# Patient Record
Sex: Male | Born: 1955 | Race: White | Hispanic: No | Smoking: Former smoker
Health system: Southern US, Community
[De-identification: ages and names within clinical notes are randomized; demographics above are authoritative.]

## PROBLEM LIST (undated history)

## (undated) DIAGNOSIS — L0390 Cellulitis, unspecified: Secondary | ICD-10-CM

## (undated) DIAGNOSIS — J439 Emphysema, unspecified: Secondary | ICD-10-CM

## (undated) DIAGNOSIS — F32A Depression, unspecified: Secondary | ICD-10-CM

## (undated) DIAGNOSIS — I10 Essential (primary) hypertension: Secondary | ICD-10-CM

## (undated) DIAGNOSIS — T8859XA Other complications of anesthesia, initial encounter: Secondary | ICD-10-CM

## (undated) DIAGNOSIS — E291 Testicular hypofunction: Secondary | ICD-10-CM

## (undated) DIAGNOSIS — F329 Major depressive disorder, single episode, unspecified: Secondary | ICD-10-CM

## (undated) DIAGNOSIS — N529 Male erectile dysfunction, unspecified: Secondary | ICD-10-CM

## (undated) DIAGNOSIS — A449 Bartonellosis, unspecified: Secondary | ICD-10-CM

## (undated) DIAGNOSIS — A770 Spotted fever due to Rickettsia rickettsii: Secondary | ICD-10-CM

## (undated) DIAGNOSIS — F0781 Postconcussional syndrome: Secondary | ICD-10-CM

## (undated) DIAGNOSIS — Z9861 Coronary angioplasty status: Principal | ICD-10-CM

## (undated) DIAGNOSIS — I2510 Atherosclerotic heart disease of native coronary artery without angina pectoris: Principal | ICD-10-CM

## (undated) DIAGNOSIS — J189 Pneumonia, unspecified organism: Secondary | ICD-10-CM

## (undated) DIAGNOSIS — I6501 Occlusion and stenosis of right vertebral artery: Secondary | ICD-10-CM

## (undated) DIAGNOSIS — G319 Degenerative disease of nervous system, unspecified: Secondary | ICD-10-CM

## (undated) DIAGNOSIS — I7300 Raynaud's syndrome without gangrene: Secondary | ICD-10-CM

## (undated) DIAGNOSIS — T4145XA Adverse effect of unspecified anesthetic, initial encounter: Secondary | ICD-10-CM

## (undated) DIAGNOSIS — F419 Anxiety disorder, unspecified: Secondary | ICD-10-CM

## (undated) DIAGNOSIS — E785 Hyperlipidemia, unspecified: Secondary | ICD-10-CM

## (undated) DIAGNOSIS — G2581 Restless legs syndrome: Secondary | ICD-10-CM

## (undated) DIAGNOSIS — G0490 Encephalitis and encephalomyelitis, unspecified: Secondary | ICD-10-CM

## (undated) DIAGNOSIS — S069XAA Unspecified intracranial injury with loss of consciousness status unknown, initial encounter: Secondary | ICD-10-CM

## (undated) DIAGNOSIS — S069X9A Unspecified intracranial injury with loss of consciousness of unspecified duration, initial encounter: Secondary | ICD-10-CM

## (undated) DIAGNOSIS — F988 Other specified behavioral and emotional disorders with onset usually occurring in childhood and adolescence: Secondary | ICD-10-CM

## (undated) HISTORY — DX: Raynaud's syndrome without gangrene: I73.00

## (undated) HISTORY — DX: Bartonellosis, unspecified: A44.9

## (undated) HISTORY — DX: Emphysema, unspecified: J43.9

## (undated) HISTORY — DX: Major depressive disorder, single episode, unspecified: F32.9

## (undated) HISTORY — DX: Occlusion and stenosis of right vertebral artery: I65.01

## (undated) HISTORY — DX: Other specified behavioral and emotional disorders with onset usually occurring in childhood and adolescence: F98.8

## (undated) HISTORY — DX: Restless legs syndrome: G25.81

## (undated) HISTORY — DX: Anxiety disorder, unspecified: F41.9

## (undated) HISTORY — DX: Atherosclerotic heart disease of native coronary artery without angina pectoris: I25.10

## (undated) HISTORY — DX: Male erectile dysfunction, unspecified: N52.9

## (undated) HISTORY — DX: Coronary angioplasty status: Z98.61

## (undated) HISTORY — DX: Postconcussional syndrome: F07.81

## (undated) HISTORY — DX: Degenerative disease of nervous system, unspecified: G31.9

## (undated) HISTORY — DX: Pneumonia, unspecified organism: J18.9

## (undated) HISTORY — DX: Depression, unspecified: F32.A

## (undated) HISTORY — DX: Testicular hypofunction: E29.1

## (undated) HISTORY — DX: Unspecified intracranial injury with loss of consciousness status unknown, initial encounter: S06.9XAA

## (undated) HISTORY — DX: Essential (primary) hypertension: I10

## (undated) HISTORY — DX: Encephalitis and encephalomyelitis, unspecified: G04.90

## (undated) HISTORY — DX: Unspecified intracranial injury with loss of consciousness of unspecified duration, initial encounter: S06.9X9A

## (undated) HISTORY — DX: Cellulitis, unspecified: L03.90

## (undated) HISTORY — DX: Spotted fever due to Rickettsia rickettsii: A77.0

## (undated) HISTORY — DX: Hyperlipidemia, unspecified: E78.5

## (undated) NOTE — *Deleted (*Deleted)
PATIENT: Phillip [name] DOB: [birth year]  REASON FOR VISIT: follow up HISTORY FROM: patient  HISTORY OF PRESENT ILLNESS: Today 03/28/20:  09/26/19: Mr. Walkowiak is a 69 year old male with a history of insomnia and ADHD.  He returns today for follow-up.  He has reduced Ativan to 0.5 mg at bedtime.  Reports that this continues to work fairly well for him.  He would like to further reduce his dose.  He continues on Adderall 60 mg daily.  Reports that he also switched his diet to a plant-based diet.  He returns today for an evaluation.  HISTORY 03/29/19:  Mr. Haslam is a 38 year old male with a history of insomnia and ADHD.  He returns today for follow-up.  He continues on Adderall 60 mg daily.  He reports that this continues to work well for him.  He has continue trying to wean off of Ativan.  He is now taking 1 1/2 mg alternating with 1 milligram every other night.  He states that this is working well for him.  He would like to continue to decrease his dose.  When he had his sleep study in 2014 he did have  periodic limb movement disorder.  He was tried on Requip but reports that it caused confusion.  Since then he has not been on any other medication.  He returns today for an evaluation.  REVIEW OF SYSTEMS: Out of a complete 14 system review of symptoms, the patient complains only of the following symptoms, and all other reviewed systems are negative.  See HPI  ALLERGIES: Allergies  Allergen Reactions  . Penicillins Hives    Did it involve swelling of the face/tongue/throat, SOB, or low BP? Unknown Did it involve sudden or severe rash/hives, skin peeling, or any reaction on the inside of your mouth or nose? Yes Did you need to seek medical attention at a hospital or doctor's office? Unknown When did it last happen?Occurred at age 55 or 8 If all above answers are "NO", may proceed with cephalosporin use.   . Statins Other (See Comments)    confusion  . Methylprednisolone Other (See  Comments)    Unsure of exact reaction type  . Amphetamines Other (See Comments)    Intolerant to specific formulations: [employer] amphetamine TEVA dextroamp amphetamine  . Lorazepam Other (See Comments)    Mylan lorazepam; others ok  . Propofol Other (See Comments)    Cognitive delay and emotional    HOME MEDICATIONS: Outpatient Medications Prior to Visit  Medication Sig Dispense Refill  . amLODipine (NORVASC) 5 MG tablet TAKE 1 TABLET BY MOUTH EVERY DAY 90 tablet 3  . amphetamine-dextroamphetamine (ADDERALL) 30 MG tablet Take 1-2 tablets by mouth daily. 60 tablet 0  . Ascorbic Acid (VITAMIN C PO) Take 1 tablet by mouth 2 (two) times daily.    . B Complex-C (B-COMPLEX WITH VITAMIN C) tablet Take 1 tablet by mouth daily.    . Cholecalciferol 125 MCG (5000 UT) TABS     . clopidogrel (PLAVIX) 75 MG tablet TAKE 1 TABLET BY MOUTH EVERY DAY WITH BREAKFAST 90 tablet 2  . Coenzyme Q10 (COQ10) 100 MG CAPS Take 100 mg by mouth 2 (two) times daily.    . Evolocumab (REPATHA SURECLICK) 140 MG/ML SOAJ Inject 140 mg into the skin every 14 (fourteen) days. 2 pen 11  . Menaquinone-7 (VITAMIN K2) 100 MCG CAPS     . Misc Natural Products (PROSTATE SUPPORT PO) Take 1 capsule by mouth every evening. Prostate Plus    .  nitroGLYCERIN (NITROSTAT) 0.4 MG SL tablet PLACE 1 TABLET UNDER TONGUE EVERY 5 MINS AS NEEDED FOR CHEST PAIN 25 tablet 6  . Omega-3 Fatty Acids (SUPER OMEGA 3 EPA/DHA PO) Take 1 capsule by mouth daily.    . Probiotic Product ([employer] PROBIOTIC) CAPS     . sertraline (ZOLOFT) 25 MG tablet Take 2 tablets (50 mg total) by mouth daily. 180 tablet 3  . testosterone cypionate (DEPOTESTOSTERONE CYPIONATE) 200 MG/ML injection Inject 0.3 ML once a week 10 mL 2  . Zinc Acetate (GALZIN) 25 MG CAPS Take by mouth.    . zolpidem (AMBIEN) 10 MG tablet Take 1 tablet (10 mg total) by mouth at bedtime as needed for sleep. 30 tablet 0   No facility-administered medications prior to visit.    PAST MEDICAL  HISTORY: Past Medical History:  Diagnosis Date  . ADD (attention deficit disorder)    on adderrall managed by neurology  . Anxiety    on ativan managed by Neurology  . 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 CAD S/P percutaneous coronary angioplasty 06/2017   pLAD1 55% (not significant), p-mLAD2 80% & mLAD 70% (tandem lesions - DES PCI . Synergy DES 2.75 x 38 -- 3.0 mm).  mCx ~60% - med Rx, mRCA 30% & 40%.  ER 55-60%.  . Cellulitis   . Chronic traumatic encephalopathy    right frontal-temproal lobe  . Complication of anesthesia    " BRAIN FOG "  . Coronary artery calcification seen on CAT scan 05/12/2018   Triple- by CT - lung ca screen 04/2018  Cardio 06/2018: Coronary calcium score results shows aortic atherosclerosis (with mild dilation) as well as left main and three-vessel coronary artery calcification. The calcium score is 1168 which is very high risk amount of calcium.  Based on these findings, I would recommend proceeding with a coronary CT angiogram (a more detailed study with Intravenous X  . Depression   . Erectile dysfunction 11/2016   follows with urology; sildenafil prescribed  . Meningoencephalitis 1994   [facility]  . Pneumonia   . Primary hypogonadism in male   . Raynaud's disease 51/70/0174   As complication of RMSF  . Restless legs 09/13/2015  . [place] spotted fever 2017  . TBI (traumatic brain injury) ([place]) 1997, 2009   "concussions"    PAST SURGICAL HISTORY: Past Surgical History:  Procedure Laterality Date  . CORONARY STENT INTERVENTION N/A 06/30/2018   Procedure: CORONARY STENT INTERVENTION;  Surgeon: [name], MD;  Location: [facility];;  p-mLAD2 80% & mLAD 70% (tandem lesions) - DES PCI Synergy DES 2.75 x 38 (3.0 mm).  . CT CTA CORONARY W/CA SCORE W/CM &/OR WO/CM  06/2018   Cardiac cath score read 1318.  Moderate coronary disease sent for FFR: mRCA 0.84  (non-significant), mLAD 0.64 (signifiant), pLOM(Cx) - 0,72 (significant - but distal lesion)  . LEFT HEART CATH AND CORONARY ANGIOGRAPHY N/A 06/30/2018   Procedure: LEFT HEART CATH AND CORONARY ANGIOGRAPHY;  Surgeon: [name], MD;  Location: [facility];;  pLAD1 55% (not significant), p-mLAD2 80% & mLAD 70% (tandem lesions -> DES PCI). mCx ~60% (med Rx), mRCA 30% & 40%.  ER 55-60%.   . TEE WITHOUT CARDIOVERSION N/A 09/06/2015   Procedure: TRANSESOPHAGEAL ECHOCARDIOGRAM (TEE);  Surgeon: Jerline Pain, MD;  R/O endocarditis;  . TONSILLECTOMY  1965    FAMILY HISTORY: Family  History  Problem Relation Age of Onset  . Hyperlipidemia Father   . Heart disease Maternal Grandfather 75  . Cancer Paternal Grandfather   . Mental illness Mother   . Mental illness Brother     SOCIAL HISTORY: Social History   Socioeconomic History  . Marital status: Divorced    Spouse name: [name]  . Number of children: 2  . Years of education: 58  . Highest education level: Not on file  Occupational History  . Occupation: Retired  Tobacco Use  . Smoking status: Former Smoker    Packs/day: 1.50    Years: 25.00    Pack years: 37.50    Types: Cigarettes    Quit date: 11/22/2014    Years since quitting: 5.3  . Smokeless tobacco: Never Used  . Tobacco comment: Encouraged to remain smoke free  Vaping Use  . Vaping Use: Never used  Substance and Sexual Activity  . Alcohol use: No    Alcohol/week: 0.0 standard drinks  . Drug use: No  . Sexual activity: Yes    Partners: Female  Other Topics Concern  . Not on file  Social History Narrative   Divorced. B.A. degree. Retired.   Drinks caffeine, uses herbal remedies, takes a daily vitamin.   Wears his seatbelt.  Smoke detector in the home.   Firearms locked in the home.   Feels safe in relationships.       He now has switched to a vegan diet.  Notably increase exercise level.   Social Determinants of Health   Financial Resource Strain:   .  Difficulty of Paying Living Expenses: Not on file  Food Insecurity:   . Worried About Charity fundraiser in the Last Year: Not on file  . Ran Out of Food in the Last Year: Not on file  Transportation Needs:   . Lack of Transportation (Medical): Not on file  . Lack of Transportation (Non-Medical): Not on file  Physical Activity:   . Days of Exercise per Week: Not on file  . Minutes of Exercise per Session: Not on file  Stress:   . Feeling of Stress : Not on file  Social Connections:   . Frequency of Communication with Friends and Family: Not on file  . Frequency of Social Gatherings with Friends and Family: Not on file  . Attends Religious Services: Not on file  . Active Member of Clubs or Organizations: Not on file  . Attends Archivist Meetings: Not on file  . Marital Status: Not on file  Intimate Partner Violence:   . Fear of Current or Ex-Partner: Not on file  . Emotionally Abused: Not on file  . Physically Abused: Not on file  . Sexually Abused: Not on file      PHYSICAL EXAM  There were no vitals filed for this visit. There is no height or weight on file to calculate BMI.  Generalized: Well developed, in no acute distress   Neurological examination  Mentation: Alert oriented to time, place, history taking. Follows all commands speech and language fluent Cranial nerve II-XII: Pupils were equal round reactive to light. Extraocular movements were full, visual field were full on confrontational test.  Head turning and shoulder shrug  were normal and symmetric. Motor: The motor testing reveals 5 over 5 strength of all 4 extremities. Good symmetric motor tone is noted throughout.  Sensory: Sensory testing is intact to soft touch on all 4 extremities. No evidence of extinction is noted.  Coordination:  Cerebellar testing reveals good finger-nose-finger and heel-to-shin bilaterally.  Gait and station: Gait is normal Reflexes: Deep tendon reflexes are symmetric and  normal bilaterally.   DIAGNOSTIC DATA (LABS, IMAGING, TESTING) - I reviewed patient records, labs, notes, testing and imaging myself where available.  Lab Results  Component Value Date   WBC 3.9 07/08/2019   HGB 14.3 07/08/2019   HCT 43.5 07/08/2019   MCV 96 07/08/2019   PLT 269 07/08/2019      Component Value Date/Time   NA 140 03/22/2020 0915   K 5.6 (H) 03/22/2020 0915   CL 103 03/22/2020 0915   CO2 27 03/22/2020 0915   GLUCOSE 98 03/22/2020 0915   GLUCOSE 108 (H) 06/08/2019 1819   BUN 16 03/22/2020 0915   CREATININE 0.93 03/22/2020 0915   CREATININE 0.71 01/03/2015 1339   CALCIUM 9.4 03/22/2020 0915   PROT 6.9 03/22/2020 0915   ALBUMIN 4.6 03/22/2020 0915   AST 27 03/22/2020 0915   ALT 16 03/22/2020 0915   ALKPHOS 86 03/22/2020 0915   BILITOT 0.5 03/22/2020 0915   GFRNONAA 88 03/22/2020 0915   GFRNONAA >89 04/12/2014 1722   GFRAA 101 03/22/2020 0915   GFRAA >89 04/12/2014 1722   Lab Results  Component Value Date   CHOL 134 03/22/2020   HDL 72 03/22/2020   LDLCALC 52 03/22/2020   TRIG 42 03/22/2020   CHOLHDL 1.9 03/22/2020   Lab Results  Component Value Date   HGBA1C 5.0 07/08/2019    Lab Results  Component Value Date   TSH 2.67 12/15/2017      ASSESSMENT AND PLAN 54 y.o. year old male  has a past medical history of ADD (attention deficit disorder), Anxiety, Atrophy, cortical (2017), Bartonella infection (2017), CAD S/P percutaneous coronary angioplasty (06/2017), Cellulitis, Chronic traumatic encephalopathy, Complication of anesthesia, Coronary artery calcification seen on CAT scan (05/12/2018), Depression, Erectile dysfunction (11/2016), Meningoencephalitis (1994), Pneumonia, Primary hypogonadism in male, Raynaud's disease (09/13/2015), Restless legs (09/13/2015), [facility] spotted fever (2017), and TBI (traumatic brain injury) ([facility]) (1997, 2009). here with:  ADHD   Continue Adderall 60 mg daily  Insomnia   Continue Ativan to 0.25 mg at  bedtime  Advised if symptoms worsen or he develops new symptoms he should let us know.  Follow-up in 6 months or sooner if needed   I spent 20 minutes of face-to-face and non-face-to-face time with patient.  This included previsit chart review, lab review, study review, order entry, electronic health record documentation, patient education.  Butch Penny, MSN, NP-C 03/28/2020, 7:58 AM [facility] [address], [address] [place], [place] [address] [phone]

---
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
## 1992-05-05 DIAGNOSIS — G0490 Encephalitis and encephalomyelitis, unspecified: Secondary | ICD-10-CM

## 1992-05-05 HISTORY — DX: Encephalitis and encephalomyelitis, unspecified: G04.90

## 2002-07-03 ENCOUNTER — Encounter: Payer: Self-pay | Admitting: Emergency Medicine

## 2002-07-03 ENCOUNTER — Emergency Department (HOSPITAL_COMMUNITY): Admission: EM | Admit: 2002-07-03 | Discharge: 2002-07-03 | Payer: Self-pay | Admitting: Emergency Medicine

## 2011-04-09 ENCOUNTER — Encounter: Payer: Self-pay | Admitting: Internal Medicine

## 2011-06-18 ENCOUNTER — Other Ambulatory Visit: Payer: Self-pay | Admitting: Family Medicine

## 2011-06-18 ENCOUNTER — Encounter: Payer: Self-pay | Admitting: Internal Medicine

## 2011-06-30 ENCOUNTER — Telehealth: Payer: Self-pay

## 2011-06-30 NOTE — Telephone Encounter (Signed)
CHART AT N[place] . [place] [id number]

## 2011-06-30 NOTE — Telephone Encounter (Signed)
Patient calling to get a refill on his adderall , he states he would like Dr. Merla Riches to know he has made ant appt to see him Wednesday,,

## 2011-07-01 NOTE — Telephone Encounter (Signed)
Can refill on appt Wednesday, as it is now tues night.  Phillip [name]

## 2011-07-05 ENCOUNTER — Telehealth: Payer: Self-pay

## 2011-07-05 NOTE — Telephone Encounter (Signed)
LMOM TO CB.  APPT WAS SUPPOSE TO BE LAST WED.  DO NOT SEE APPT IN FILE.  NEEDS TO BE SEEN.  SEE PREVIOUS MESSAGES

## 2011-07-05 NOTE — Telephone Encounter (Signed)
PT CALLED AND SAID REQUESTED ADDERALL TO BE REFILLED 3 DAYS AGO.  PT SAYS HE TAKES 30 MG 3 X DAILY

## 2011-07-06 NOTE — Telephone Encounter (Signed)
LMOM TO CB 

## 2011-07-06 NOTE — Telephone Encounter (Signed)
PT WILL RTC.  PT MISSED APPT ON 06/18/11.  ADVISED PT HE MUST BE SEEN

## 2011-07-09 ENCOUNTER — Ambulatory Visit (INDEPENDENT_AMBULATORY_CARE_PROVIDER_SITE_OTHER): Payer: BC Managed Care – PPO | Admitting: Internal Medicine

## 2011-07-09 VITALS — BP 127/80 | HR 71 | Temp 97.7°F | Resp 16 | Ht 72.5 in | Wt 178.0 lb

## 2011-07-09 DIAGNOSIS — Z1339 Encounter for screening examination for other mental health and behavioral disorders: Secondary | ICD-10-CM

## 2011-07-09 DIAGNOSIS — F09 Unspecified mental disorder due to known physiological condition: Secondary | ICD-10-CM

## 2011-07-09 DIAGNOSIS — F988 Other specified behavioral and emotional disorders with onset usually occurring in childhood and adolescence: Secondary | ICD-10-CM

## 2011-07-09 MED ORDER — AMPHETAMINE-DEXTROAMPHETAMINE 30 MG PO TABS: 30.0000 mg | ORAL_TABLET | Freq: Two times a day (BID) | ORAL | Status: DC

## 2011-07-09 NOTE — Patient Instructions (Signed)
Take meds as directed

## 2011-07-09 NOTE — Progress Notes (Signed)
  Subjective:    Patient ID: Phillip [name], male    DOB: [birth year], 56 y.o.   MRN: [id number]  HPI weakness fatigue dificulty concentrationg. Ran out of adderall 1 week ago. Decreased sthse dose himself to bid. Had been on meds for 20 years since encephalitis.   Review of Systems  Constitutional: Positive for activity change and fatigue.  HENT: Negative.   Eyes: Negative.   Respiratory: Negative.   Cardiovascular: Negative.   Gastrointestinal: Negative.   Genitourinary: Negative.   Musculoskeletal: Negative.   Neurological: Negative.   Hematological: Negative.   Psychiatric/Behavioral: Negative.   All other systems reviewed and are negative.       Objective:   Physical Exam  Nursing note and vitals reviewed. Constitutional: He is oriented to person, place, and time. He appears well-developed and well-nourished.  HENT:  Head: Normocephalic and atraumatic.  Eyes: Conjunctivae and EOM are normal. Pupils are equal, round, and reactive to light.  Neck: Normal range of motion.  Cardiovascular: Normal rate, regular rhythm and normal heart sounds.   Pulmonary/Chest: Effort normal and breath sounds normal.  Abdominal: Soft. Bowel sounds are normal.  Musculoskeletal: Normal range of motion.  Neurological: He is alert and oriented to person, place, and time.  Skin: Skin is warm and dry.  Psychiatric: He has a normal mood and affect. His behavior is normal. Judgment and thought content normal.          Assessment & Plan:  Adhd, previous encephalomeningitis meds to be renewed.

## 2011-08-09 ENCOUNTER — Telehealth: Payer: Self-pay

## 2011-08-09 ENCOUNTER — Other Ambulatory Visit: Payer: Self-pay | Admitting: Internal Medicine

## 2011-08-09 DIAGNOSIS — F09 Unspecified mental disorder due to known physiological condition: Secondary | ICD-10-CM

## 2011-08-09 MED ORDER — AMPHETAMINE-DEXTROAMPHETAMINE 30 MG PO TABS: 30.0000 mg | ORAL_TABLET | Freq: Two times a day (BID) | ORAL | Status: DC

## 2011-08-09 NOTE — Telephone Encounter (Signed)
Adderall RF, please call pt to pick-up

## 2011-08-09 NOTE — Telephone Encounter (Signed)
Pt requests adderall refill  Best: [phone]

## 2011-08-09 NOTE — Telephone Encounter (Signed)
LMOM notifying patient rx ready to pick up. 

## 2011-08-17 ENCOUNTER — Other Ambulatory Visit: Payer: Self-pay | Admitting: Physician Assistant

## 2011-09-12 ENCOUNTER — Telehealth: Payer: Self-pay

## 2011-09-12 DIAGNOSIS — F09 Unspecified mental disorder due to known physiological condition: Secondary | ICD-10-CM

## 2011-09-12 NOTE — Telephone Encounter (Signed)
.  umfc The patient called to request refill of Adderall 30 mg twice daily tablets.  The patient may be reached at [phone] when ready for pick up.

## 2011-09-13 MED ORDER — AMPHETAMINE-DEXTROAMPHETAMINE 30 MG PO TABS: 30.0000 mg | ORAL_TABLET | Freq: Two times a day (BID) | ORAL | Status: DC

## 2011-09-13 NOTE — Telephone Encounter (Signed)
Spoke with pt advised RX ready to pick up 

## 2011-09-13 NOTE — Telephone Encounter (Signed)
Refill complete and at TL desk

## 2011-10-14 ENCOUNTER — Telehealth: Payer: Self-pay

## 2011-10-14 DIAGNOSIS — F09 Unspecified mental disorder due to known physiological condition: Secondary | ICD-10-CM

## 2011-10-14 MED ORDER — AMPHETAMINE-DEXTROAMPHETAMINE 30 MG PO TABS: 30.0000 mg | ORAL_TABLET | Freq: Two times a day (BID) | ORAL | Status: DC

## 2011-10-14 NOTE — Telephone Encounter (Signed)
LMOM THAT RX IS READY FOR PICKUP 

## 2011-10-14 NOTE — Telephone Encounter (Signed)
Rx done and ready for pickup 

## 2011-10-14 NOTE — Telephone Encounter (Signed)
PT IN NEED OF HIS ADDERALL 30MG S. PLEASE CALL [phone] WHEN READY FOR P/U

## 2011-11-13 ENCOUNTER — Other Ambulatory Visit: Payer: Self-pay | Admitting: Physician Assistant

## 2011-11-13 NOTE — Telephone Encounter (Signed)
PT REQUESTING ADDERALL REFILL   BEST PHONE  [phone]

## 2011-11-14 ENCOUNTER — Other Ambulatory Visit: Payer: Self-pay | Admitting: *Deleted

## 2011-11-16 ENCOUNTER — Telehealth: Payer: Self-pay

## 2011-11-16 DIAGNOSIS — F09 Unspecified mental disorder due to known physiological condition: Secondary | ICD-10-CM

## 2011-11-16 MED ORDER — AMPHETAMINE-DEXTROAMPHETAMINE 30 MG PO TABS: 30.0000 mg | ORAL_TABLET | Freq: Two times a day (BID) | ORAL | Status: DC

## 2011-11-16 NOTE — Telephone Encounter (Signed)
Called pt advised to pick up RX

## 2011-11-16 NOTE — Telephone Encounter (Signed)
Rx printed

## 2011-11-16 NOTE — Telephone Encounter (Signed)
PATIENT SAID HE CALLED TWO-THREE DAYS AGO ABOUT A REFILL ON HIS ADDERALL.  HE RAN OUT ON THE 12TH.  HE HAS NOT HEARD FROM [place] AND I DO NOT SEE A MESSAGE IN REGARDS TO THIS.  PLEASE CALL ASAP AT [phone]

## 2011-12-15 ENCOUNTER — Other Ambulatory Visit: Payer: Self-pay | Admitting: Physician Assistant

## 2011-12-15 ENCOUNTER — Other Ambulatory Visit: Payer: Self-pay | Admitting: Family Medicine

## 2011-12-15 MED ORDER — LORAZEPAM 2 MG PO TABS: 2.0000 mg | ORAL_TABLET | Freq: Every day | ORAL | Status: DC

## 2011-12-17 ENCOUNTER — Telehealth: Payer: Self-pay

## 2011-12-17 DIAGNOSIS — F09 Unspecified mental disorder due to known physiological condition: Secondary | ICD-10-CM

## 2011-12-17 MED ORDER — AMPHETAMINE-DEXTROAMPHETAMINE 30 MG PO TABS: 30.0000 mg | ORAL_TABLET | Freq: Two times a day (BID) | ORAL | Status: DC

## 2011-12-17 NOTE — Telephone Encounter (Signed)
Notified pt Rx ready for p/up 

## 2011-12-17 NOTE — Telephone Encounter (Signed)
Done and printed

## 2011-12-17 NOTE — Telephone Encounter (Signed)
Pt would like a refill on adderall 30mg . Best# [phone]

## 2012-01-06 ENCOUNTER — Emergency Department (HOSPITAL_BASED_OUTPATIENT_CLINIC_OR_DEPARTMENT_OTHER)
Admission: EM | Admit: 2012-01-06 | Discharge: 2012-01-06 | Disposition: A | Discharge status: Home / Self Care | Payer: BC Managed Care – PPO | Attending: Emergency Medicine | Admitting: Emergency Medicine

## 2012-01-06 ENCOUNTER — Encounter (HOSPITAL_BASED_OUTPATIENT_CLINIC_OR_DEPARTMENT_OTHER): Payer: Self-pay | Admitting: *Deleted

## 2012-01-06 DIAGNOSIS — Z87891 Personal history of nicotine dependence: Secondary | ICD-10-CM | POA: Insufficient documentation

## 2012-01-06 DIAGNOSIS — L259 Unspecified contact dermatitis, unspecified cause: Secondary | ICD-10-CM

## 2012-01-06 MED ORDER — DOXYCYCLINE HYCLATE 100 MG PO CAPS: 100.0000 mg | ORAL_CAPSULE | Freq: Two times a day (BID) | ORAL | Status: AC

## 2012-01-06 MED ORDER — LIDOCAINE-PRILOCAINE 2.5-2.5 % EX CREA: TOPICAL_CREAM | CUTANEOUS | Status: AC | PRN

## 2012-01-06 MED ORDER — HYDROCORTISONE 2.5 % EX CREA: TOPICAL_CREAM | Freq: Two times a day (BID) | CUTANEOUS | Status: AC

## 2012-01-06 NOTE — ED Notes (Signed)
MD at bedside. 

## 2012-01-06 NOTE — ED Notes (Signed)
Redness, swelling, pain to his right ankle.

## 2012-01-07 NOTE — ED Provider Notes (Signed)
History     CSN: [id number]  Arrival date & time 01/06/12  2045   First MD Initiated Contact with Patient 01/06/12 2204      Chief Complaint  Patient presents with  . Abscess    (Consider location/radiation/quality/duration/timing/severity/associated sxs/prior treatment) HPI Phillip [name] is a 56 y.o. male presents for a rash on his medial malleolus of the right foot. Patient says he is active this weekend swimming but did not get into any poison ivy that he knows of. Was wearing a boot yesterday and noticed some irritation on the medial aspect of his foot yesterday. His pain irritation is currently a 1/2, mildly itchy, has a mild pain, does not radiate, no other alleviating or exacerbating factors and no other associated symptoms. Patient denies any fevers, chills, calf pain  History reviewed. No pertinent past medical history.  History reviewed. No pertinent past surgical history.  No family history on file.  History  Substance Use Topics  . Smoking status: Former Games developer  . Smokeless tobacco: Not on file  . Alcohol Use: Yes      Review of Systems positive for rash; Patient denies any fevers or chills, changes in vision, earache, sore throat, neck pain or stiffness, chest pain or pressure, palpitations, syncope, dyspnea, cough, wheezing,  abdominal pain, nausea, vomiting, diarrhea, melena, red bloody stools, frequency, dysuria, myalgias, arthralgias, back pain, recent trauma, easy bruising or bleeding, headache, seizures, numbness, tingling or weakness.   Allergies  Penicillins  Home Medications   Current Outpatient Rx  Name Route Sig Dispense Refill  . DONEPEZIL HCL 10 MG PO TABS Oral Take 10 mg by mouth at bedtime.    . ADULT MULTIVITAMIN W/MINERALS CH Oral Take 1 tablet by mouth daily.    Marland Kitchen FISH OIL PO Oral Take 1 capsule by mouth daily.    Marland Kitchen DOXYCYCLINE HYCLATE 100 MG PO CAPS Oral Take 1 capsule (100 mg total) by mouth 2 (two) times daily. 20 capsule 0  .  HYDROCORTISONE 2.5 % EX CREA Topical Apply topically 2 (two) times daily. 30 g 0  . LIDOCAINE-PRILOCAINE 2.5-2.5 % EX CREA Topical Apply topically as needed. 30 g 0    BP 131/64  Pulse 75  Temp 97.8 F (36.6 C) (Oral)  Resp 20  SpO2 99%  Physical Exam  Skin:      VITAL SIGNS:   Filed Vitals:   01/06/12 2109  BP: 131/64  Pulse: 75  Temp: 97.8 F (36.6 C)  Resp: 20   CONSTITUTIONAL: Awake, oriented, appears non-toxic HENT: Atraumatic, normocephalic, oral mucosa pink and moist, airway patent. Nares patent without drainage. External ears normal. EYES: Conjunctiva clear, EOMI, PERRLA NECK: Trachea midline, non-tender, supple CARDIOVASCULAR: Normal heart rate, Normal rhythm, No murmurs, rubs, gallops PULMONARY/CHEST: Clear to auscultation, no rhonchi, wheezes, or rales. Symmetrical breath sounds. Non-tender. ABDOMINAL: Non-distended, soft, non-tender - no rebound or guarding.  BS normal. NEUROLOGIC: Non-focal, moving all four extremities, no gross sensory or motor deficits. EXTREMITIES: No clubbing, cyanosis. Patient has a well demarcated area of erythema on his left medial malleolus. There appear to be some vesicles on the anterior aspect of the lesion and there is one blister over the center of the malleolus. It is not draining, is not bleeding, it is mildly pruritic. SKIN: Warm, Dry, No erythema, No rash  ED Course  Procedures (including critical care time)  Labs Reviewed - No data to display No results found.   1. Contact dermatitis       MDM  Phillip [name] is a 56 y.o. male presenting with a likely contact dermatitis on the medial malleolus. approximately 2 meters by 5 cm, well-demarcated borders is very suggestive of a poison ivy/Toxicodendron dermatitis. Patient says he has had contact with brown recluse spiders in the past, however his injuries and consistent with a spider bite, is possible that it is a bite and in that case we will give the patient some  antibiotics in case the wound worsens. I have drawn a line around the lesion with a skin marking pen.  Patient will use hydrocortisone cream and some EMLA cream for comfort.  He understands to return to emergency department or seek care from his primary care provider in case the wound worsens or there are any signs of infections.  If patient has a delay in seeking care he may fill the prescription for doxycycline and begin taking antibiotics.        Jones Skene, MD 01/07/12 1610

## 2012-01-14 ENCOUNTER — Encounter: Payer: BC Managed Care – PPO | Admitting: Internal Medicine

## 2012-01-17 ENCOUNTER — Other Ambulatory Visit: Payer: Self-pay

## 2012-01-17 MED ORDER — LORAZEPAM 2 MG PO TABS: 2.0000 mg | ORAL_TABLET | Freq: Four times a day (QID) | ORAL | Status: AC | PRN

## 2012-01-19 ENCOUNTER — Telehealth: Payer: Self-pay

## 2012-01-19 MED ORDER — AMPHETAMINE-DEXTROAMPHETAMINE 30 MG PO TABS: 30.0000 mg | ORAL_TABLET | Freq: Two times a day (BID) | ORAL | Status: DC

## 2012-01-19 NOTE — Telephone Encounter (Signed)
Patient notified and rx in pickup drawer. 

## 2012-01-19 NOTE — Telephone Encounter (Signed)
Rx done and ready for pickup 

## 2012-01-19 NOTE — Telephone Encounter (Signed)
Pt requesting adderall refill 30 mg one tablet two times day    CBN: [phone]

## 2012-01-19 NOTE — Telephone Encounter (Signed)
Patient due for follow up. His appt was RS from 01/14/12 to October by our office, he does plan to come in October for this (Dr Merla Riches)

## 2012-01-29 ENCOUNTER — Ambulatory Visit (INDEPENDENT_AMBULATORY_CARE_PROVIDER_SITE_OTHER): Payer: BC Managed Care – PPO | Admitting: Family Medicine

## 2012-01-29 VITALS — BP 128/80 | HR 83 | Temp 98.1°F | Resp 16 | Ht 73.3 in | Wt 179.6 lb

## 2012-01-29 DIAGNOSIS — L723 Sebaceous cyst: Secondary | ICD-10-CM

## 2012-01-29 NOTE — Progress Notes (Signed)
Subjective:    Patient ID: Phillip [name], male    DOB: Dec 31, 1955, 56 y.o.   MRN: [id number]  HPI  1 mo prev had Lt foot infection, then developed Rt ankle "non-contact dermatitis" but wasn't healing well. Was treated w/ pain cream and anti-itch cream and eventually went away.  He also has an extensive h/o bad luck - has had episodes of cellulitis, spider bites, meningitis, encephalitis, etc so he gets easily concerned when he has new health problems About 2 wks ago, he noticed a lump on his head and several days later another bump appeared. Saw a physician and diagnosed w/ scalp cellulitis and started on Bactrim which he is now on day 8 of 10.  The bumps are unchanged in size, he thinks maybe they are more tender and he is developing some slight soreness in his neck.  He does not have any fungal infection. He has not had any hair loss or problems. Bumps have never been warm, red, or more than mildly tender. Reports the prev dr checked his blood and he was "negative for infection."      Review of Systems  Constitutional: Positive for fatigue. Negative for fever, chills and diaphoresis.  HENT: Positive for neck pain. Negative for ear pain, nosebleeds, congestion, sore throat, rhinorrhea, neck stiffness and postnasal drip.   Eyes: Negative for pain and discharge.  Respiratory: Negative for cough and shortness of breath.   Cardiovascular: Negative for chest pain.  Skin: Negative for color change, rash and wound.  Neurological: Negative for dizziness, facial asymmetry, numbness and headaches.  Hematological: Positive for adenopathy.  Psychiatric/Behavioral: Positive for disturbed wake/sleep cycle.       Objective:   Physical Exam  Constitutional: He is oriented to person, place, and time. He appears well-developed and well-nourished. No distress.  HENT:  Head: Normocephalic and atraumatic.  Right Ear: Tympanic membrane, external ear and ear canal normal.  Left Ear: Tympanic membrane, external  ear and ear canal normal.  Nose: Nose normal.  Mouth/Throat: Oropharynx is clear and moist. No oropharyngeal exudate.  Eyes: Conjunctivae normal and EOM are normal. Right eye exhibits no discharge. Left eye exhibits no discharge. No scleral icterus.  Neck: Normal range of motion. Neck supple. No thyromegaly present.  Cardiovascular: Normal rate, regular rhythm and normal heart sounds.   Pulmonary/Chest: Effort normal and breath sounds normal. No respiratory distress.  Lymphadenopathy:       Head (right side): No submental, no submandibular, no preauricular, no posterior auricular and no occipital adenopathy present.       Head (left side): No submental, no submandibular, no preauricular, no posterior auricular and no occipital adenopathy present.    He has no cervical adenopathy.       Right cervical: No superficial cervical, no deep cervical and no posterior cervical adenopathy present.      Left cervical: No superficial cervical, no deep cervical and no posterior cervical adenopathy present.  Neurological: He is alert and oriented to person, place, and time.  Skin: Skin is warm and dry. No rash noted. He is not diaphoretic. No erythema.  Psychiatric: He has a normal mood and affect. His behavior is normal.          Assessment & Plan:  1. Sebaceous cysts: Will refer to derm for removal to confirm diagnosis. Had pt evaluated by Dr. Cleta Alberts as well who agreed w/ plan.  Due to pt's h/o multiple random infections, he is a little nervous about these cysts and would like  them removed.

## 2012-02-16 ENCOUNTER — Other Ambulatory Visit: Payer: Self-pay | Admitting: Physician Assistant

## 2012-02-16 ENCOUNTER — Other Ambulatory Visit: Payer: Self-pay | Admitting: *Deleted

## 2012-02-16 NOTE — Telephone Encounter (Signed)
Please pull paper chart.  

## 2012-02-16 NOTE — Telephone Encounter (Signed)
Patients chart is at the nurses station in the pa pool pile.  [facility] [id number]

## 2012-02-18 ENCOUNTER — Telehealth: Payer: Self-pay

## 2012-02-18 ENCOUNTER — Other Ambulatory Visit (HOSPITAL_COMMUNITY): Payer: Self-pay | Admitting: Otolaryngology

## 2012-02-18 NOTE — Telephone Encounter (Signed)
Pt is requesting amphetamine-dextroamphetamine (ADDERALL) 30 MG tablet CBN [phone]

## 2012-02-19 ENCOUNTER — Other Ambulatory Visit: Payer: Self-pay

## 2012-02-19 MED ORDER — AMPHETAMINE-DEXTROAMPHETAMINE 30 MG PO TABS: 30.0000 mg | ORAL_TABLET | Freq: Two times a day (BID) | ORAL | Status: DC

## 2012-02-19 NOTE — Telephone Encounter (Signed)
PATIENT SAYS HE HAS CALLED SEVERAL TIMES ASKING IF RX IS READY FOR HIM TO PICK. NOTIFIED PATIENT THAT HE WOULD RECEIVE A CALL IN REGARDS IF READY TO PICK AND HE UNDERSTOOD. PLEASE NOTIFY PATIENT IF RX IS READY. THANK YOU!

## 2012-02-19 NOTE — Telephone Encounter (Signed)
Please advise on renewal, he is due for follow up for this, he called on this earlier in the week, but message not routed, Rx pended.

## 2012-02-19 NOTE — Telephone Encounter (Signed)
Prescription printed and signed, NEEDS OFFICE VISIT BEFORE THIS PRESCRIPTION RUNS OUT

## 2012-03-10 ENCOUNTER — Encounter: Payer: Self-pay | Admitting: Internal Medicine

## 2012-03-10 ENCOUNTER — Ambulatory Visit (INDEPENDENT_AMBULATORY_CARE_PROVIDER_SITE_OTHER): Payer: BC Managed Care – PPO | Admitting: Internal Medicine

## 2012-03-10 VITALS — BP 126/90 | HR 74 | Temp 97.9°F | Resp 16 | Ht 72.0 in | Wt 180.0 lb

## 2012-03-10 DIAGNOSIS — G0490 Encephalitis and encephalomyelitis, unspecified: Secondary | ICD-10-CM

## 2012-03-10 DIAGNOSIS — F418 Other specified anxiety disorders: Secondary | ICD-10-CM

## 2012-03-10 DIAGNOSIS — F341 Dysthymic disorder: Secondary | ICD-10-CM

## 2012-03-10 DIAGNOSIS — G0491 Myelitis, unspecified: Secondary | ICD-10-CM

## 2012-03-10 DIAGNOSIS — F988 Other specified behavioral and emotional disorders with onset usually occurring in childhood and adolescence: Secondary | ICD-10-CM

## 2012-03-10 MED ORDER — MODAFINIL 100 MG PO TABS: 100.0000 mg | ORAL_TABLET | Freq: Every day | ORAL | Status: DC

## 2012-03-10 MED ORDER — AMPHETAMINE-DEXTROAMPHETAMINE 30 MG PO TABS: 30.0000 mg | ORAL_TABLET | Freq: Two times a day (BID) | ORAL | Status: DC

## 2012-03-10 MED ORDER — LORAZEPAM 2 MG PO TABS: 2.0000 mg | ORAL_TABLET | Freq: Every day | ORAL | Status: DC

## 2012-03-10 NOTE — Progress Notes (Addendum)
Subjective:    Patient ID: Phillip [name], male    DOB: Nov 12, 1955, 56 y.o.   MRN: [id number]  CC: 56 yo W M presents to office for f/u  HPIf/u  Patient Active Problem List  Diagnosis  . Encephalomeningitis  . ADD (attention deficit disorder)  . Depression with anxiety     Since the last visit the pt has been evaluated for a mass behind his R ear.  It was sx removed.  There is another mass the surgeon did not want to remove just yet.  He c/o HA's that feel numb and painful to the touch in the area of these masses.  He visited a neuroscience Dr at [facility] who took blood and will get the results back next Friday.  He hopes this Dr. Stann Mainland be able to figure out the significance of these masses.  Pt is taking Adderall and his sleep medication but wants to discuss the meds he found out about when he went to the [facility].  The medication that he seemed the most interested in adding is Provigil.  His lorazepam helps with his sleep.  He has also started a gluten free diet and doing well so far.  He is also continuing hyperbaric O2 tx and has 20 sessions left.  He thinks this is helping him overall even though has been more impulsive then normal recently.      We discussed the idea of hiring someone to help organize his life.  He thinks that this might help him feel less depressed.     Review of Systems Noncontributory     Objective:   Physical Exam General: 56 yo M particiaptes well in the discussion of his health and appears to have insight into his condition.  He is pleasant and cooperative. Vitals:  Filed Vitals:   03/10/12 1221  BP: 126/90  Pulse: 74  Temp: 97.9 F (36.6 C)  Resp: 16  HEENT: Nontraumatic, EOMIT, Heart: Regular rate Lungs: NAD MSK: Normal bulk and tone Neuro: Alert, oriented, CN II - XII grossly IT      Assessment & Plan:   1. Encephalomeningitis   2. ADD (attention deficit disorder)   3. Depression with anxiety   4.  Insomnia  Meds ordered this encounter   Medications  . amphetamine-dextroamphetamine (ADDERALL) 30 MG tablet    Sig: Take 1 tablet (30 mg total) by mouth 2 (two) times daily.    Dispense:  60 tablet    Refill:  0  . modafinil (PROVIGIL) 100 MG tablet    Sig: Take 1 tablet (100 mg total) by mouth daily.    Dispense:  30 tablet    Refill:  1  . amphetamine-dextroamphetamine (ADDERALL) 30 MG tablet    Sig: Take 1 tablet (30 mg total) by mouth 2 (two) times daily.    Dispense:  60 tablet    Refill:  0  . amphetamine-dextroamphetamine (ADDERALL) 30 MG tablet    Sig: Take 1 tablet (30 mg total) by mouth 2 (two) times daily.    Dispense:  60 tablet    Refill:  0   Patient Instructions   You have 3 prescriptions for Adderall 30 mg twice a day The prescription for Provigil is for 100 mg tablets/If you've had no problems after one week but you notice no difference in you can increase to 2 tablets in the morning as a trial you'll have to call and ask me for a refill sooner if you do that  Meds ordered this encounter  Medications  . amphetamine-dextroamphetamine (ADDERALL) 30 MG tablet    Sig: Take 1 tablet (30 mg total) by mouth 2 (two) times daily.    Dispense:  60 tablet    Refill:  0  . modafinil (PROVIGIL) 100 MG tablet    Sig: Take 1 tablet (100 mg total) by mouth daily.    Dispense:  30 tablet    Refill:  1  . amphetamine-dextroamphetamine (ADDERALL) 30 MG tablet    Sig: Take 1 tablet (30 mg total) by mouth 2 (two) times daily.    Dispense:  60 tablet    Refill:  0  . amphetamine-dextroamphetamine (ADDERALL) 30 MG tablet    Sig: Take 1 tablet (30 mg total) by mouth 2 (two) times daily.    Dispense:  60 tablet    Refill:  0    -  Ativan 2mg  to be called or faxed in #30/5ref is contr insomnia well    call for followup with regard to adjusting Provigil Labs from [facility] will be forwarded to [place]

## 2012-03-10 NOTE — Addendum Note (Signed)
Addended by: Tonye Pearson on: 03/10/2012 06:11 PM   Modules accepted: Orders

## 2012-03-10 NOTE — Patient Instructions (Addendum)
You have 3 prescriptions for Adderall 30 mg twice a day The prescription for Provigil is for 100 mg tablets/If you've had no problems after one week but you notice no difference in you can increase to 2 tablets in the morning as a trial you'll have to call and ask me for a refill sooner if you do that  Meds ordered this encounter  Medications  . amphetamine-dextroamphetamine (ADDERALL) 30 MG tablet    Sig: Take 1 tablet (30 mg total) by mouth 2 (two) times daily.    Dispense:  60 tablet    Refill:  0  . modafinil (PROVIGIL) 100 MG tablet    Sig: Take 1 tablet (100 mg total) by mouth daily.    Dispense:  30 tablet    Refill:  1  . amphetamine-dextroamphetamine (ADDERALL) 30 MG tablet    Sig: Take 1 tablet (30 mg total) by mouth 2 (two) times daily.    Dispense:  60 tablet    Refill:  0  . amphetamine-dextroamphetamine (ADDERALL) 30 MG tablet    Sig: Take 1 tablet (30 mg total) by mouth 2 (two) times daily.    Dispense:  60 tablet    Refill:  0

## 2012-03-16 ENCOUNTER — Other Ambulatory Visit: Payer: Self-pay | Admitting: Physician Assistant

## 2012-03-22 ENCOUNTER — Encounter: Payer: Self-pay | Admitting: Internal Medicine

## 2012-03-22 ENCOUNTER — Other Ambulatory Visit: Payer: Self-pay | Admitting: *Deleted

## 2012-03-22 NOTE — Telephone Encounter (Signed)
Pharmacy requesting refill on lorazepam 2mg .  Last filled 02/16/12

## 2012-03-22 NOTE — Telephone Encounter (Signed)
Prescription written 11 06/13/ 2mg /milligrams at bedtime #30 with 5 refills Does this mean he needs the medication more than once a day at bedtime? If so I would be glad to refill it as one tablet twice a day, canceling the other prescription, and offering 2 refills

## 2012-03-23 NOTE — Progress Notes (Signed)
After getting more info from Dr Merla Riches, completed form for prior auth of Provigil and faxed to [facility].

## 2012-03-29 NOTE — Progress Notes (Signed)
Received approval for pt's provigil from 03/23/12 - 12/17/14. Faxed approval notice to pharmacy.

## 2012-04-10 NOTE — Telephone Encounter (Signed)
Did we get an answer here?

## 2012-04-12 NOTE — Telephone Encounter (Signed)
I am unsure as to the answer on this one, looks like Sheketia had contact with patient, and pharmacy but I am not sure what the outcome was. I have left message for patient to call me back, so I can find out.

## 2012-04-12 NOTE — Addendum Note (Signed)
Addended byCaffie Damme on: 04/12/2012 08:39 AM   Modules accepted: Orders

## 2012-04-13 NOTE — Addendum Note (Signed)
Addended by: Sheppard Plumber A on: 04/13/2012 03:33 PM   Modules accepted: Orders

## 2012-04-13 NOTE — Telephone Encounter (Signed)
Spoke w/pt who stated that everything got straightened out w/his Rx last month. His pharmacy had not received the Rx we sent in on 03/10/12, but everything is fine, and pt just needs it Qhs.

## 2012-05-12 ENCOUNTER — Encounter: Payer: BC Managed Care – PPO | Admitting: Internal Medicine

## 2012-05-26 ENCOUNTER — Ambulatory Visit: Payer: BC Managed Care – PPO | Admitting: Internal Medicine

## 2012-06-19 ENCOUNTER — Telehealth: Payer: Self-pay

## 2012-06-19 ENCOUNTER — Other Ambulatory Visit: Payer: Self-pay

## 2012-06-19 NOTE — Telephone Encounter (Signed)
PT IS CALLING FOR A REFILL ON ADERROLL PLEASE ALL PT TO ADVISE

## 2012-06-21 MED ORDER — AMPHETAMINE-DEXTROAMPHETAMINE 30 MG PO TABS: 30.0000 mg | ORAL_TABLET | Freq: Two times a day (BID) | ORAL | Status: DC

## 2012-06-21 NOTE — Telephone Encounter (Signed)
No orders of the defined types were placed in this encounter.   Meds ordered this encounter  Medications  . amphetamine-dextroamphetamine (ADDERALL) 30 MG tablet    Sig: Take 1 tablet (30 mg total) by mouth 2 (two) times daily.    Dispense:  60 tablet    Refill:  0

## 2012-06-23 ENCOUNTER — Ambulatory Visit (INDEPENDENT_AMBULATORY_CARE_PROVIDER_SITE_OTHER): Payer: BC Managed Care – PPO | Admitting: Internal Medicine

## 2012-06-23 ENCOUNTER — Encounter: Payer: Self-pay | Admitting: Internal Medicine

## 2012-06-23 VITALS — BP 126/84 | HR 73 | Temp 96.9°F | Resp 16 | Ht 72.0 in | Wt 187.0 lb

## 2012-06-23 DIAGNOSIS — F988 Other specified behavioral and emotional disorders with onset usually occurring in childhood and adolescence: Secondary | ICD-10-CM

## 2012-06-23 DIAGNOSIS — G0490 Encephalitis and encephalomyelitis, unspecified: Secondary | ICD-10-CM

## 2012-06-23 MED ORDER — SILDENAFIL CITRATE 100 MG PO TABS: 50.0000 mg | ORAL_TABLET | Freq: Every day | ORAL | Status: DC | PRN

## 2012-06-23 MED ORDER — AMPHETAMINE-DEXTROAMPHETAMINE 30 MG PO TABS: 30.0000 mg | ORAL_TABLET | Freq: Three times a day (TID) | ORAL | Status: DC

## 2012-06-25 ENCOUNTER — Encounter: Payer: Self-pay | Admitting: Internal Medicine

## 2012-06-25 NOTE — Progress Notes (Signed)
Followup Patient Active Problem List  Diagnosis  . Encephalomeningitis  . ADD (attention deficit disorder)  . Depression with anxiety   moods are much better Continues with Adderall with mild to moderate improvement in cognitive function Completed bariatrics without success No followup with psychiatry and neuropsychiatry this year but that's okay with him No response to Provigil at last visit No other somatic symptoms  Separated from wife/divorced I believe/kids doing well He has a new partner/successful with Viagra/testosterone has been normal   Exam BP 126/84  Pulse 73  Temp(Src) 96.9 F (36.1 C)  Resp 16  Ht 6' (1.829 m)  Wt 187 lb (84.823 kg)  BMI 25.36 kg/m2 No acute distress Oriented to time person place Mood good/affect appropriate/judgment sound   Problem #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 amphetamine-dextroamphetamine (ADDERALL) 30 MG tablet    Sig: Take 1 tablet (30 mg total) by mouth 3 (three) times daily.    Dispense:  90 tablet    Refill:  0  . amphetamine-dextroamphetamine (ADDERALL) 30 MG tablet    Sig: Take 1 tablet (30 mg total) by mouth 3 (three) times daily.    Dispense:  90 tablet    Refill:  0  . amphetamine-dextroamphetamine (ADDERALL) 30 MG tablet    Sig: Take 1 tablet (30 mg total) by mouth 3 (three) times daily.    Dispense:  90 tablet    Refill:  0  . sildenafil (VIAGRA) 100 MG tablet    Sig: Take 0.5-1 tablets (50-100 mg total) by mouth daily as needed for erectile dysfunction.    Dispense:  5 tablet    Refill:  11

## 2012-07-28 ENCOUNTER — Encounter: Payer: BC Managed Care – PPO | Admitting: Internal Medicine

## 2012-09-27 ENCOUNTER — Telehealth: Payer: Self-pay

## 2012-09-27 NOTE — Telephone Encounter (Signed)
Pharm requests RF of lorazepam 2 mg

## 2012-09-28 ENCOUNTER — Telehealth: Payer: Self-pay

## 2012-09-28 NOTE — Telephone Encounter (Signed)
See prev phone mess

## 2012-09-28 NOTE — Telephone Encounter (Signed)
Ok to call in refill of existing rx

## 2012-09-28 NOTE — Telephone Encounter (Signed)
Called in 1 mos RF.

## 2012-09-28 NOTE — Telephone Encounter (Signed)
Pharm reqs RF of Lorazepam 2 mg

## 2012-10-24 ENCOUNTER — Telehealth: Payer: Self-pay

## 2012-10-24 MED ORDER — AMPHETAMINE-DEXTROAMPHETAMINE 30 MG PO TABS: 30.0000 mg | ORAL_TABLET | Freq: Three times a day (TID) | ORAL | Status: DC

## 2012-10-24 NOTE — Telephone Encounter (Signed)
Meds ordered this encounter  Medications  . amphetamine-dextroamphetamine (ADDERALL) 30 MG tablet    Sig: Take 1 tablet (30 mg total) by mouth 3 (three) times daily. For 12/24/12    Dispense:  90 tablet    Refill:  0  . amphetamine-dextroamphetamine (ADDERALL) 30 MG tablet    Sig: Take 1 tablet (30 mg total) by mouth 3 (three) times daily. For 11/23/12    Dispense:  90 tablet    Refill:  0  . amphetamine-dextroamphetamine (ADDERALL) 30 MG tablet    Sig: Take 1 tablet (30 mg total) by mouth 3 (three) times daily.    Dispense:  90 tablet    Refill:  0

## 2012-10-24 NOTE — Telephone Encounter (Signed)
MR. Mohler IS CALLING TO GET HIS PRESCRIPTION WRITTEN FOR ADDERALL  30MG . HE SAID DR. DOOLITTLE USUALLY WRITES FOR 3 MONTHS. PLEASE CALL HIM WHEN IT IS READY TO BE PICKED UP. BEST PHONE [phone] (CELL)    [facility]

## 2012-10-25 NOTE — Telephone Encounter (Signed)
Left message to advise Rx ready for pick up at front desk.

## 2012-10-29 ENCOUNTER — Other Ambulatory Visit: Payer: Self-pay | Admitting: Radiology

## 2012-10-29 MED ORDER — LORAZEPAM 2 MG PO TABS: 2.0000 mg | ORAL_TABLET | Freq: Every day | ORAL | Status: DC

## 2012-10-29 NOTE — Telephone Encounter (Signed)
Please advise on refill of Lorazepam 2mg / pended

## 2012-11-11 ENCOUNTER — Other Ambulatory Visit: Payer: Self-pay | Admitting: Internal Medicine

## 2013-01-11 ENCOUNTER — Telehealth: Payer: Self-pay

## 2013-01-11 NOTE — Telephone Encounter (Signed)
Had adderall 12/24/12--should be good///call patient to troubleshoot

## 2013-01-11 NOTE — Telephone Encounter (Signed)
Phillip [name] Wah IS IN A LOT OF PAIN AND WOULD LIKE TO HAVE 16 DAYS WORTH OF THE PAIN MEDICINE NOT IN THE GENERIC FORM PLEASE CALL [phone]   [place]

## 2013-01-11 NOTE — Telephone Encounter (Signed)
Dr Merla Riches, I dont see where this pt has had any pain medication prescribed. I called to verify, they need 16 days worth of Adderall. He has a physical scheduled in November. He would like the brand name of Adderall. Please advise.

## 2013-01-12 MED ORDER — AMPHETAMINE-DEXTROAMPHETAMINE 30 MG PO TABS: 30.0000 mg | ORAL_TABLET | Freq: Three times a day (TID) | ORAL | Status: DC

## 2013-01-12 NOTE — Telephone Encounter (Signed)
Called Phillip [name] and she states the Adderall generic is not helping she wants to know if he can get Rx for name brand Adderall please advise.

## 2013-01-12 NOTE — Telephone Encounter (Signed)
lmom that rx is ready for pickup.  

## 2013-01-12 NOTE — Telephone Encounter (Signed)
Meds ordered this encounter  Medications  . amphetamine-dextroamphetamine (ADDERALL) 30 MG tablet    Sig: Take 1 tablet (30 mg total) by mouth 3 (three) times daily. Brand name only--16 day supply    Dispense:  50 tablet    Refill:  0   Tell them if change not effective immediately, then he needs f/u before scheduled appt

## 2013-01-31 ENCOUNTER — Telehealth: Payer: Self-pay

## 2013-01-31 DIAGNOSIS — R4189 Other symptoms and signs involving cognitive functions and awareness: Secondary | ICD-10-CM | POA: Insufficient documentation

## 2013-01-31 MED ORDER — AMPHETAMINE-DEXTROAMPHETAMINE 30 MG PO TABS: 30.0000 mg | ORAL_TABLET | Freq: Three times a day (TID) | ORAL | Status: DC

## 2013-01-31 NOTE — Telephone Encounter (Signed)
Ok Meds ordered this encounter  Medications  . amphetamine-dextroamphetamine (ADDERALL) 30 MG tablet    Sig: Take 1 tablet (30 mg total) by mouth 3 (three) times daily.    Dispense:  90 tablet    Refill:  0   Patient Active Problem List   Diagnosis Date Noted  . Cognitive impairment 01/31/2013  . 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 ADD (attention deficit disorder) 03/10/2012  . Depression with anxiety 03/10/2012

## 2013-01-31 NOTE — Telephone Encounter (Signed)
PT REQUESTING NON-GENERIC ADDERALL RX TO HOLD HIM [facility] HIS November 12 PE   BEST PHONE [phone]

## 2013-02-01 NOTE — Telephone Encounter (Signed)
Called to advise Rx ready for pick up

## 2013-03-06 ENCOUNTER — Telehealth: Payer: Self-pay

## 2013-03-06 NOTE — Telephone Encounter (Signed)
NEEDS REFILL ON ADDERAL. HE IS DUE FOR AN OFFICE VISIT AND HE HAS AN APPT ON 11/12. NEEDS A REFILL BEFORE THEN.

## 2013-03-07 MED ORDER — AMPHETAMINE-DEXTROAMPHETAMINE 30 MG PO TABS: 30.0000 mg | ORAL_TABLET | Freq: Three times a day (TID) | ORAL | Status: DC

## 2013-03-07 NOTE — Telephone Encounter (Signed)
Rx up front for p/u. Tried to call pt and let him know but no answer and VM box is full

## 2013-03-07 NOTE — Telephone Encounter (Signed)
Meds ordered this encounter  Medications  . amphetamine-dextroamphetamine (ADDERALL) 30 MG tablet    Sig: Take 1 tablet (30 mg total) by mouth 3 (three) times daily. For 12/24/12    Dispense:  90 tablet    Refill:  0

## 2013-03-10 ENCOUNTER — Other Ambulatory Visit: Payer: Self-pay

## 2013-03-16 ENCOUNTER — Ambulatory Visit (INDEPENDENT_AMBULATORY_CARE_PROVIDER_SITE_OTHER): Payer: BC Managed Care – PPO | Admitting: Internal Medicine

## 2013-03-16 ENCOUNTER — Encounter: Payer: Self-pay | Admitting: Internal Medicine

## 2013-03-16 VITALS — BP 128/84 | HR 74 | Temp 98.0°F | Resp 16 | Ht 72.0 in | Wt 178.2 lb

## 2013-03-16 DIAGNOSIS — G479 Sleep disorder, unspecified: Secondary | ICD-10-CM

## 2013-03-16 DIAGNOSIS — R4189 Other symptoms and signs involving cognitive functions and awareness: Secondary | ICD-10-CM

## 2013-03-16 DIAGNOSIS — F418 Other specified anxiety disorders: Secondary | ICD-10-CM

## 2013-03-16 DIAGNOSIS — F09 Unspecified mental disorder due to known physiological condition: Secondary | ICD-10-CM

## 2013-03-16 DIAGNOSIS — F988 Other specified behavioral and emotional disorders with onset usually occurring in childhood and adolescence: Secondary | ICD-10-CM

## 2013-03-16 DIAGNOSIS — Z0000 Encounter for general adult medical examination without abnormal findings: Secondary | ICD-10-CM

## 2013-03-16 DIAGNOSIS — F341 Dysthymic disorder: Secondary | ICD-10-CM

## 2013-03-16 DIAGNOSIS — G0490 Encephalitis and encephalomyelitis, unspecified: Secondary | ICD-10-CM

## 2013-03-16 LAB — POCT URINALYSIS DIPSTICK
Bilirubin, UA: NEGATIVE
Blood, UA: NEGATIVE
Glucose, UA: NEGATIVE
Ketones, UA: NEGATIVE
Leukocytes, UA: NEGATIVE
Spec Grav, UA: 1.01
Urobilinogen, UA: 0.2

## 2013-03-16 LAB — CBC WITH DIFFERENTIAL/PLATELET
Eosinophils Absolute: 0.1 10*3/uL (ref 0.0–0.7)
Eosinophils Relative: 3 % (ref 0–5)
HCT: 44.6 % (ref 39.0–52.0)
Hemoglobin: 15.6 g/dL (ref 13.0–17.0)
Lymphocytes Relative: 33 % (ref 12–46)
Lymphs Abs: 1.8 10*3/uL (ref 0.7–4.0)
MCH: 31.3 pg (ref 26.0–34.0)
MCV: 89.4 fL (ref 78.0–100.0)
Monocytes Relative: 10 % (ref 3–12)
Platelets: 320 10*3/uL (ref 150–400)
RBC: 4.99 MIL/uL (ref 4.22–5.81)
WBC: 5.6 10*3/uL (ref 4.0–10.5)

## 2013-03-16 LAB — LIPID PANEL
Cholesterol: 256 mg/dL — ABNORMAL HIGH (ref 0–200)
Total CHOL/HDL Ratio: 3.9 Ratio
Triglycerides: 97 mg/dL (ref <150)

## 2013-03-16 LAB — COMPREHENSIVE METABOLIC PANEL
ALT: 32 U/L (ref 0–53)
AST: 36 U/L (ref 0–37)
Albumin: 4.7 g/dL (ref 3.5–5.2)
CO2: 27 mEq/L (ref 19–32)
Calcium: 9.6 mg/dL (ref 8.4–10.5)
Chloride: 102 mEq/L (ref 96–112)
Creat: 0.84 mg/dL (ref 0.50–1.35)
Glucose, Bld: 100 mg/dL — ABNORMAL HIGH (ref 70–99)
Potassium: 4.9 mEq/L (ref 3.5–5.3)
Total Bilirubin: 1.1 mg/dL (ref 0.3–1.2)
Total Protein: 7.4 g/dL (ref 6.0–8.3)

## 2013-03-16 LAB — IFOBT (OCCULT BLOOD): IFOBT: NEGATIVE

## 2013-03-16 MED ORDER — TRAZODONE HCL 50 MG PO TABS: 25.0000 mg | ORAL_TABLET | Freq: Every evening | ORAL | Status: DC | PRN

## 2013-03-16 MED ORDER — ADDERALL 30 MG PO TABS: 30.0000 mg | ORAL_TABLET | Freq: Three times a day (TID) | ORAL | Status: DC

## 2013-03-16 NOTE — Progress Notes (Addendum)
Subjective:    Patient ID: Phillip [name], male    DOB: 06-20-1955, 57 y.o.   MRN: [id number]  HPICPE He continues to have a life is dominated by cognitive problems with confusion, procrastination, problems with interpersonal relationships etc. He is here with his partner Clydie Braun who is able to describe some of his problems in detail. Of note she feels that he is better over the last 6 months as he has started a number of supplements under the care of a naturopath. He has also changed his diet significantly and avoids gluten and dairy. Alcohol use is rare. Of significant importance today is his description of sleep difficulties. This is the first time we have discussed this. He feels like he randomly has a few days with very poor sleep and then any time this happens he has a marked increase in his confusion. No snoring or sleep apnea. He has been on high doses of Adderall for many years. His daytime hypersomnolence episodes may be masked by this medicine. He certainly experiences fatigue frequently. Social anxiety has been important but he does not have a generalized anxiety disorder.  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  HENT: Negative.   Eyes: Negative.   Respiratory: Negative.   Cardiovascular: Negative.   Gastrointestinal: Negative.   Endocrine: Negative.   Genitourinary: Negative.   Musculoskeletal: Negative.   Skin: Negative.   Allergic/Immunologic: Positive for food allergies.  Neurological: Negative.   Hematological: Negative.   Psychiatric/Behavioral: Positive for confusion. The patient is nervous/anxious.        Objective:   Physical Exam  Constitutional: He is oriented to person, place, and time. He appears well-developed and well-nourished.  HENT:  Head: Normocephalic.  Right Ear: External ear normal.  Left Ear: External ear normal.  Nose: Nose normal.  Mouth/Throat: Oropharynx is clear and moist.  Tms and canals clear  Eyes: Conjunctivae and EOM are normal. Pupils are equal, round, and reactive to light.  Neck: Normal range of motion. Neck supple. No thyromegaly present.  Cardiovascular: Normal rate, regular rhythm, normal heart sounds and intact distal pulses.   No murmur heard. Pulmonary/Chest: Effort normal and breath sounds normal. No respiratory distress. He has no wheezes. He has no rales.  Abdominal: Soft. Bowel sounds are normal. He exhibits no distension and no mass. There is no tenderness. There is no rebound and no guarding.  No hepatosplenomegaly  Genitourinary: Rectum normal, prostate normal and penis normal. Guaiac negative stool.  Musculoskeletal: Normal range of motion. He exhibits no edema and no tenderness.  Lymphadenopathy:    He has no cervical adenopathy.  Neurological: He is alert and oriented to person, place, and time. He has normal reflexes. No cranial nerve deficit. He exhibits normal muscle tone. Coordination normal.  Skin: Skin  is warm and dry. No rash noted.  Psychiatric: He has a normal mood and affect. His behavior is normal. Judgment and thought content normal.    Results for orders placed in visit on 03/16/13  CBC WITH DIFFERENTIAL      Result Value Range   WBC 5.6  4.0 - 10.5 K/uL   RBC 4.99  4.22 - 5.81 MIL/uL   Hemoglobin 15.6  13.0 - 17.0 g/dL   HCT 04.5  40.9 - 81.1 %   MCV 89.4  78.0 - 100.0 fL   MCH 31.3  26.0 - 34.0 pg   MCHC 35.0  30.0 - 36.0 g/dL   RDW 91.4  78.2 - 95.6 %   Platelets 320  150 - 400 K/uL   Neutrophils Relative % 53  43 - 77 %   Neutro Abs 3.0  1.7 - 7.7 K/uL   Lymphocytes Relative 33  12 - 46 %   Lymphs Abs 1.8  0.7 - 4.0 K/uL   Monocytes Relative 10  3 - 12 %   Monocytes Absolute 0.5  0.1 - 1.0 K/uL   Eosinophils Relative 3  0 - 5 %   Eosinophils Absolute 0.1  0.0 - 0.7 K/uL   Basophils Relative 1  0 - 1 %   Basophils Absolute 0.1  0.0 - 0.1 K/uL   Smear Review Criteria for review not met    COMPREHENSIVE METABOLIC PANEL      Result Value Range   Sodium 139  135 - 145 mEq/L   Potassium 4.9  3.5 - 5.3 mEq/L   Chloride 102  96 - 112 mEq/L   CO2 27  19 - 32 mEq/L   Glucose, Bld 100 (*) 70 - 99 mg/dL   BUN 16  6 - 23 mg/dL   Creat 2.13  0.86 - 5.78 mg/dL   Total Bilirubin 1.1  0.3 - 1.2 mg/dL   Alkaline Phosphatase 92  39 - 117 U/L   AST 36  0 - 37 U/L   ALT 32  0 - 53 U/L   Total Protein 7.4  6.0 - 8.3 g/dL   Albumin 4.7  3.5 - 5.2 g/dL   Calcium 9.6  8.4 - 46.9 mg/dL  LIPID PANEL      Result Value Range   Cholesterol 256 (*) 0 - 200 mg/dL   Triglycerides 97  <629 mg/dL   HDL 65  >52 mg/dL   Total CHOL/HDL Ratio 3.9     VLDL 19  0 - 40 mg/dL   LDL Cholesterol 841 (*) 0 - 99 mg/dL  TSH      Result Value Range   TSH 2.574  0.350 - 4.500 uIU/mL  PSA      Result Value Range   PSA 0.57  <=4.00 ng/mL  POCT URINALYSIS DIPSTICK      Result Value Range   Color, UA yellow     Clarity, UA clear     Glucose, UA neg     Bilirubin, UA neg     Ketones, UA neg     Spec Grav, UA 1.010     Blood, UA neg     pH, UA 7.0     Protein, UA neg     Urobilinogen, UA 0.2     Nitrite, UA neg     Leukocytes, UA  Negative    IFOBT (OCCULT BLOOD)      Result Value Range   IFOBT Negative  Assessment & Plan:  Routine general medical examination at a health care facility - Plan: POCT urinalysis dipstick, CBC with Differential, Comprehensive metabolic panel, Lipid panel, TSH, PSA, IFOBT POC (occult bld, rslt in office)  ADD (attention deficit disorder)--diagnosed at age 34  Cognitive impairment - Plan: TSH, Ambulatory referral to Sleep Studies Dr Altar---I'll send copies of pertinent evaluations  Depression with anxiety - Plan: Ambulatory referral to Sleep Studies  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  Sleep disturbance, unspecified - Plan: Ambulatory referral to Sleep Studies  Trial of trazadone 25-50 for delayed sleep onset since 2 mg ativan not helpful  Hyperlipidemia-LDL 152 in 2011, 143 in 2009   It would seem prudent to rule out narcolepsy or another sleep disorder as part of this complex problem Sleep latency testing may be necessary while off of stimulants  He also wore followup with the [facility] clinic in [place] ,[place]  He will investigate what the [employer] is now doing the traumatic brain injuries as well  He would like to return to his hyperbaric oxygen treatments in [place] as he felt this was helpful last year  He will continue his supplements and dietary changes  He will look for someone to coordinate his financial affairs in order to relieve the stress and be sure that his cognitive problems are not playing a deleterious role

## 2013-03-17 LAB — TSH: TSH: 2.574 u[IU]/mL (ref 0.350–4.500)

## 2013-03-17 LAB — PSA: PSA: 0.57 ng/mL (ref <=4.00)

## 2013-03-21 ENCOUNTER — Encounter: Payer: Self-pay | Admitting: Internal Medicine

## 2013-03-21 ENCOUNTER — Telehealth: Payer: Self-pay

## 2013-03-21 NOTE — Telephone Encounter (Signed)
Thanks. Has been advised.

## 2013-03-21 NOTE — Telephone Encounter (Signed)
Called her, this may not be related to medications, is he improving or worsening? He is getting worse through the day, she states he is feverish and feels sick to his stomach. I have advised her to make sure he is getting lots of rest and getting enough fluids. She indicates she may bring him in, if he gets worse. Encouraged her to make sure he gets enough fluids.

## 2013-03-21 NOTE — Telephone Encounter (Signed)
Patient is experiencing some symptoms after taking Trazodone 50mg . Patient is unsure if he took 1 or 2 pills last night but he is sick on his stomach and feeling feverish today. Please call Clydie Braun at [phone]

## 2013-03-21 NOTE — Telephone Encounter (Signed)
Not side effects of trazadone---expect illness

## 2013-03-22 ENCOUNTER — Encounter: Payer: Self-pay | Admitting: Neurology

## 2013-03-22 ENCOUNTER — Ambulatory Visit (INDEPENDENT_AMBULATORY_CARE_PROVIDER_SITE_OTHER): Payer: BC Managed Care – PPO | Admitting: Neurology

## 2013-03-22 VITALS — BP 120/71 | HR 70 | Temp 97.1°F | Ht 72.0 in | Wt 183.0 lb

## 2013-03-22 DIAGNOSIS — R5381 Other malaise: Secondary | ICD-10-CM

## 2013-03-22 DIAGNOSIS — Z8661 Personal history of infections of the central nervous system: Secondary | ICD-10-CM

## 2013-03-22 DIAGNOSIS — R5383 Other fatigue: Secondary | ICD-10-CM

## 2013-03-22 DIAGNOSIS — R569 Unspecified convulsions: Secondary | ICD-10-CM

## 2013-03-22 DIAGNOSIS — G479 Sleep disorder, unspecified: Secondary | ICD-10-CM

## 2013-03-22 DIAGNOSIS — Z8619 Personal history of other infectious and parasitic diseases: Secondary | ICD-10-CM

## 2013-03-22 DIAGNOSIS — IMO0001 Reserved for inherently not codable concepts without codable children: Secondary | ICD-10-CM

## 2013-03-22 DIAGNOSIS — F09 Unspecified mental disorder due to known physiological condition: Secondary | ICD-10-CM

## 2013-03-22 NOTE — Patient Instructions (Addendum)
Remember to drink plenty of fluid, eat healthy meals and do not skip any meals. Try to eat protein with a every meal and eat a healthy snack such as fruit or nuts in between meals. Try to keep a regular sleep-wake schedule and try to exercise daily, particularly in the form of walking, 20-30 minutes a day, if you can.   As far as your medications are concerned, I would like to suggest no changes. You can continue to work with Dr. Merla Riches on taking as needed sleep aids.     As far as diagnostic testing: sleep study and nap study. I will see you back after the sleep testing.   Please also call us for any test results so we can go over those with you on the phone. Brett Canales is my clinical assistant and will answer any of your questions and relay your messages to me and also relay most of my messages to you.  Our phone number is [phone]. We also have an after hours call service for urgent matters and there is a physician on-call for urgent questions. For any emergencies you know to call 911 or go to the nearest emergency room.

## 2013-03-22 NOTE — Progress Notes (Signed)
Subjective:    Patient ID: Phillip [name] is a 57 y.o. male.  HPI    [name], MD, PhD [facility] [address], [address] [address] [place], [place] [address]  Dear Dr. Merla Riches,  I saw your patient, Phillip [name], upon your kind request in my neurologic clinic today for initial consultation of his sleep disorder, in particular, his excessive daytime somnolence. The patient is accompanied by his friend, Clydie Braun, today. 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 He does endorse daytime somnolence. He does not endorse snoring or apneic spells. He had blood work in your office on 03/16/2013 which I reviewed: He has a normal CBC, normal CMP, lipid panel showed increased cholesterol at 256 and LDL increased at 172, TSH was normal, PSA was normal, urinalysis was negative. For years, he has been tried on different stimulants, antidepressants and sleep aids. He was tried on dementia medicine, he was tested with cognitive tests. He is followed at the [facility] in [place]. He has tried [place] in the past. He has tried OTC medications for sleep: Sound Asleep, which contains chamomile and Valeria, he has tried melatonin. He is not currently sleeping with anyone, and has been living alone for 4 years. He has 2 children, ages 67 and 90. He is divorced, but sees his children every day and that is not a source of stress for him. He had ADD before his encephalitis. He has gone 2-3 days without sleep he states. He denies sleep attacks. He has never had CBT or biofeedback. He has seen counselors over the years. He has been to neuropsychologists at [facility] and [facility].  He has no scheduled wake time and bedtime. He was  placed on Trazodone for the past 3 days, and took 25 mg and 50 mg, and thus far it was not helpful and he has had some nightmares. He cannot nap. He was advised to have a sleep study years ago as per recommendation of his neuropsychologists.   He denies cataplexy, sleep paralysis, hypnagogic or hypnopompic hallucinations, or sleep attacks. He does not report any vivid dreams, nightmares, dream enactments, or parasomnias, such as sleep talking or sleep walking. The patient has not had a sleep study or a home sleep test. He does not endorse much in the way of caffeine intake. He tries to take care of his diet and exercises regularly.  His Past Medical History Is Significant For: Past Medical History  Diagnosis Date  . Depression   . Encephalitis   . Anxiety     His Past Surgical History Is Significant For: History reviewed. No pertinent past surgical history.  His Family History Is Significant For: Family History  Problem Relation Age of Onset  . Hyperlipidemia Father   . Heart disease Maternal Grandfather   . Cancer Paternal Grandfather     His Social History Is Significant For: History   Social History  . Marital Status: Married    Spouse Name: N/A    Number of Children: N/A  . Years of Education: N/A   Social History Main Topics  . Smoking status: Former Games developer  . Smokeless tobacco: None  . Alcohol Use: Yes     Comment: 8 drinks  . Drug Use: No  .  Sexual Activity: None   Other Topics Concern  . None   Social History Narrative   Divorced. Education: [employer].    His Allergies Are:  Allergies  Allergen Reactions  . Penicillins Hives  :   His Current Medications Are:  Outpatient Encounter Prescriptions as of 03/22/2013  Medication Sig  . ADDERALL 30 MG tablet Take 1 tablet (30 mg total) by mouth 3 (three) times daily. 05/06/13  . Omega-3 Fatty Acids (FISH OIL PO) Take 1 capsule by mouth daily.  Marland Kitchen OVER THE COUNTER MEDICATION OTC Vitamin D3 2000 iu taking 1 daily  .  OVER THE COUNTER MEDICATION Neuro 2 taking 2 a day  . OVER THE COUNTER MEDICATION Neuroflam 2 a day  . OVER THE COUNTER MEDICATION Neurovite Plus taking 2 a day  . OVER THE COUNTER MEDICATION Brain/Memory taking 2 a day  . traZODone (DESYREL) 50 MG tablet Take 0.5-1 tablets (25-50 mg total) by mouth at bedtime as needed for sleep.  . sildenafil (VIAGRA) 100 MG tablet Take 0.5-1 tablets (50-100 mg total) by mouth daily as needed for erectile dysfunction.  . [DISCONTINUED] ADDERALL 30 MG tablet Take 1 tablet (30 mg total) by mouth 3 (three) times daily. For 04/05/13  . [DISCONTINUED] ADDERALL 30 MG tablet Take 1 tablet (30 mg total) by mouth 3 (three) times daily. 06/06/13  . [DISCONTINUED] Multiple Vitamin (MULTIVITAMIN WITH MINERALS) TABS Take 1 tablet by mouth daily.  . [DISCONTINUED] OVER THE COUNTER MEDICATION    Review of Systems:  Out of a complete 14 point review of systems, all are reviewed and negative with the exception of these symptoms as listed below:  Review of Systems  Constitutional: Positive for activity change (disinterest) and fatigue.  HENT: Negative.   Eyes:       Loss of vision  Respiratory: Negative.   Cardiovascular: Negative.   Gastrointestinal: Negative.   Endocrine: Negative.   Genitourinary: Negative.   Musculoskeletal: Negative.   Skin: Negative.   Allergic/Immunologic: Negative.   Neurological:       Memory loss  Hematological: Negative.   Psychiatric/Behavioral: Positive for confusion, sleep disturbance and dysphoric mood. The patient is nervous/anxious.        Racing thoughts    Objective:  Neurologic Exam  Physical Exam Physical Examination:   Filed Vitals:   03/22/13 1022  BP: 120/71  Pulse: 70  Temp: 97.1 F (36.2 C)    General Examination: The patient is a very pleasant 57 y.o. male in no acute distress. He appears well-developed and well-nourished and well groomed.   HEENT: Normocephalic, atraumatic, pupils are equal, round and  reactive to light and accommodation. Funduscopic exam is normal with sharp disc margins noted. Extraocular tracking is good without limitation to gaze excursion or nystagmus noted. Normal smooth pursuit is noted. Hearing is grossly intact. Tympanic membranes are clear bilaterally. Face is symmetric with normal facial animation and normal facial sensation. Speech is clear with no dysarthria noted. There is no hypophonia. There is no lip, neck/head, jaw or voice tremor. Neck is supple with full range of passive and active motion. There are no carotid bruits on auscultation. Oropharynx exam reveals: mild mouth dryness, good dental hygiene and no significant airway crowding and absent tonsils. Mallampati is class I. Tongue protrudes centrally and palate elevates symmetrically. Neck size is 15 7/8 inches.   Chest: Clear to auscultation without wheezing, rhonchi or crackles noted.  Heart: S1+S2+0, regular and normal without murmurs, rubs or gallops noted.   Abdomen: Soft, non-tender  and non-distended with normal bowel sounds appreciated on auscultation.  Extremities: There is no pitting edema in the distal lower extremities bilaterally. Pedal pulses are intact.  Skin: Warm and dry without trophic changes noted. There are no varicose veins.  Musculoskeletal: exam reveals no obvious joint deformities, tenderness or joint swelling or erythema.   Neurologically:  Mental status: The patient is awake, alert and oriented in all 4 spheres. His memory, attention, language and knowledge are mildly impaired. He has difficulty giving a concise and chronological history. He has some word finding difficulties. His friend helps with the history. speech is clear with normal prosody and enunciation. Thought process is linear. Mood is congruent and affect is normal.  Cranial nerves are as described above under HEENT exam. In addition, shoulder shrug is normal with equal shoulder height noted. Motor exam: Normal bulk,  strength and tone is noted. There is no drift, tremor or rebound. Romberg is negative. Reflexes are 2+ throughout. Toes are downgoing bilaterally. Fine motor skills are intact with normal finger taps, normal hand movements, normal rapid alternating patting, normal foot taps and normal foot agility.  Cerebellar testing shows no dysmetria or intention tremor on finger to nose testing. Heel to shin is unremarkable bilaterally. There is no truncal or gait ataxia.  Sensory exam is intact to light touch, pinprick, vibration, temperature sense and proprioception in the upper and lower extremities.  Gait, station and balance are unremarkable. No veering to one side is noted. No leaning to one side is noted. Posture is age-appropriate and stance is narrow based. No problems turning are noted. He turns en bloc. Tandem walk is unremarkable. Intact toe and heel stance is noted.               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 He reports severe difficulty with his sleep including disturbed sleep, nonrestorative sleep, sleep maintenance and sleep initiation issues for years. He has had some staring spells and is difficult to say if he has had daytime somnolence because there is very little additional report on the sleep except for his subjective description. We have to keep in mind that he does have cognitive issues since his brain injury. He may very well have an organic reason for insomnia. Nevertheless, he has for years been advised to undergo a sleep study and I think we should proceed with extended sleep testing in the form of nighttime sleep study and daytime sleep study to help with determination of what his sleep architecture looks like. While there is no concern by history and by his subjective description of obstructive sleep disordered  breathing, there is no recent witnessed to his sleep. He certainly denies any restless leg symptoms. He denies cataplexy and sleep attacks but again his subjective description may be difficult to interpret. At this juncture, I have suggested that we proceed with a sleep study followed by a nap study. He can discontinue taking his trazodone but as such it has not helped in the first 3 nights. We have talked about sending him back to a psychologist for consideration of cognitive behavioral therapy as well as consideration of biofeedback. He can also pursue this through the traumatic brain injury clinic he goes to in [place]. He will have to work closely with you and his [facility] for symptomatic treatment  of his insomnia. I will see him back after his sleep studies are completed. Thank you very much for allowing me to participate in the care of this nice patient. If I can be of any further assistance to you please do not hesitate to call me at [phone].  Sincerely,   [name], MD, PhD

## 2013-03-23 ENCOUNTER — Telehealth: Payer: Self-pay | Admitting: Neurology

## 2013-03-23 NOTE — Telephone Encounter (Signed)
Please advise patient that he has to be off of his stimulant prior to sleep study. He should start tapering down off Adderall starting today or tomorrow.  if he is taking it 3 times a day he should take it twice daily for the next  3 days, then once daily for 3 days, then stop. He has to stay off of it until after he is done with the sleep studies and can resume taking it thereafter.

## 2013-04-07 ENCOUNTER — Ambulatory Visit (INDEPENDENT_AMBULATORY_CARE_PROVIDER_SITE_OTHER): Payer: BC Managed Care – PPO | Admitting: Neurology

## 2013-04-07 VITALS — Ht 73.0 in | Wt 185.0 lb

## 2013-04-07 DIAGNOSIS — G4761 Periodic limb movement disorder: Secondary | ICD-10-CM

## 2013-04-07 DIAGNOSIS — F09 Unspecified mental disorder due to known physiological condition: Secondary | ICD-10-CM

## 2013-04-07 DIAGNOSIS — G4720 Circadian rhythm sleep disorder, unspecified type: Secondary | ICD-10-CM

## 2013-04-07 DIAGNOSIS — IMO0001 Reserved for inherently not codable concepts without codable children: Secondary | ICD-10-CM

## 2013-04-07 DIAGNOSIS — R5383 Other fatigue: Secondary | ICD-10-CM

## 2013-04-07 DIAGNOSIS — Z8661 Personal history of infections of the central nervous system: Secondary | ICD-10-CM

## 2013-04-07 DIAGNOSIS — G479 Sleep disorder, unspecified: Secondary | ICD-10-CM

## 2013-04-07 DIAGNOSIS — Z8619 Personal history of other infectious and parasitic diseases: Secondary | ICD-10-CM

## 2013-04-08 NOTE — Sleep Study (Signed)
See media tab for full report  

## 2013-04-08 NOTE — Sleep Study (Signed)
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  Called Dr. Frances Furbish and patient will require treatment for PLMs.

## 2013-04-15 ENCOUNTER — Telehealth: Payer: Self-pay | Admitting: Neurology

## 2013-04-15 NOTE — Telephone Encounter (Signed)
Please call and notify the patient that the recent sleep study did not show any significant obstructive sleep apnea. However, the sleep study showed significant periodic leg movements of sleep with significant arousals, indicating significant sleep disruption from leg kicking. Please inform patient that I would like to go over the details of the study during a follow up appointment and if not already previously scheduled, arrange a followup appointment (please utilize a followu-up slot). Also, route or fax report to PCP and referring MD, if other than PCP.  Once you have spoken to patient, you can close this encounter.   Thanks,  [name], MD, PhD [facility])

## 2013-04-18 ENCOUNTER — Encounter: Payer: Self-pay | Admitting: *Deleted

## 2013-04-18 NOTE — Telephone Encounter (Signed)
I called and spoke with the patient about his sleep study results. I informed the patient that the sleep study did not show any significant obstructive sleep apnea, but it showed significant periodic leg movement which caused his sleep disruption. I also informed the patient that Dr. Frances Furbish would like to discuss his results in detail, so he has agreed to the date and time of 05-11-12@10 :30. I will mail a copy of the report to his home and fax Dr. Merla Riches a copy of the report.

## 2013-04-21 ENCOUNTER — Other Ambulatory Visit: Payer: Self-pay | Admitting: Internal Medicine

## 2013-04-22 ENCOUNTER — Telehealth: Payer: Self-pay | Admitting: Family Medicine

## 2013-04-22 NOTE — Telephone Encounter (Signed)
Left message informing pt that Rx has been faxed into pharmacy.

## 2013-05-07 ENCOUNTER — Ambulatory Visit (INDEPENDENT_AMBULATORY_CARE_PROVIDER_SITE_OTHER): Payer: BC Managed Care – PPO | Admitting: Physician Assistant

## 2013-05-07 VITALS — BP 130/90 | HR 80 | Temp 97.7°F | Resp 16 | Ht 72.0 in | Wt 178.6 lb

## 2013-05-07 DIAGNOSIS — IMO0002 Reserved for concepts with insufficient information to code with codable children: Secondary | ICD-10-CM

## 2013-05-07 LAB — POCT CBC
Granulocyte percent: 64.3 %G (ref 37–80)
HCT, POC: 44.9 % (ref 43.5–53.7)
Hemoglobin: 14.3 g/dL (ref 14.1–18.1)
Lymph, poc: 1.3 (ref 0.6–3.4)
MCH, POC: 31.6 pg — AB (ref 27–31.2)
MCHC: 31.8 g/dL (ref 31.8–35.4)
MCV: 99.2 fL — AB (ref 80–97)
MID (cbc): 0.7 (ref 0–0.9)
MPV: 7.9 fL (ref 0–99.8)
POC Granulocyte: 3.5 (ref 2–6.9)
POC LYMPH PERCENT: 23.5 %L (ref 10–50)
POC MID %: 12.2 %M — AB (ref 0–12)
Platelet Count, POC: 294 10*3/uL (ref 142–424)
RBC: 4.53 M/uL — AB (ref 4.69–6.13)
RDW, POC: 13.4 %
WBC: 5.4 10*3/uL (ref 4.6–10.2)

## 2013-05-07 MED ORDER — DOXYCYCLINE HYCLATE 100 MG PO CAPS: 100.0000 mg | ORAL_CAPSULE | Freq: Two times a day (BID) | ORAL | Status: DC

## 2013-05-07 NOTE — Progress Notes (Signed)
   Subjective:    Patient ID: [name], male    DOB: 1955-05-24, 58 y.o.   MRN: [id number]  HPI 58 year old male presents for evaluation of possible insect bite on right forearm.  States he was out in the woods yesterday with his son. [place] so they decided to pull it out and bury it.  Is unsure if he possibly got a scratch from the antler or had an insect bite, but later in the day he noticed that the area became red. As the evening went on, the redness spread and the pain increased.  He has felt tired and had chills, but no documented fever, nausea, vomiting, headache, or abdominal pain.  Does have a hx of cellulitis/abscess in the past, but no known hx of MRSA. No drainage from the area. +warmth and tenderness.  Patient is otherwise doing well with no other concerns today.     Review of Systems  Constitutional: Positive for chills. Negative for fever.  Gastrointestinal: Negative for nausea, vomiting and abdominal pain.  Skin: Positive for color change and rash.  Neurological: Negative for dizziness and headaches.       Objective:   Physical Exam  Constitutional: He is oriented to person, place, and time. He appears well-developed and well-nourished.  HENT:  Head: Normocephalic and atraumatic.  Right Ear: External ear normal.  Left Ear: External ear normal.  Eyes: Conjunctivae are normal.  Neck: Normal range of motion.  Cardiovascular: Normal rate.   Pulmonary/Chest: Effort normal.  Neurological: He is alert and oriented to person, place, and time.  Skin:     Psychiatric: He has a normal mood and affect. His behavior is normal. Judgment and thought content normal.          Assessment & Plan:  Cellulitis and abscess of upper arm and forearm - Plan: POCT CBC, doxycycline (VIBRAMYCIN) 100 MG capsule  Start doxycycline 100 mg twice daily x 10 days Warm compresses 2-3 times daily RTC if developing fever, chills, nausea, vomiting, or area of  redness spreading outside of area marked with skin scribe.

## 2013-05-09 ENCOUNTER — Ambulatory Visit (INDEPENDENT_AMBULATORY_CARE_PROVIDER_SITE_OTHER): Payer: BC Managed Care – PPO | Admitting: Internal Medicine

## 2013-05-09 VITALS — BP 118/72 | HR 72 | Temp 97.8°F | Resp 18 | Ht 72.0 in | Wt 179.0 lb

## 2013-05-09 DIAGNOSIS — S5011XD Contusion of right forearm, subsequent encounter: Secondary | ICD-10-CM

## 2013-05-09 DIAGNOSIS — Z5189 Encounter for other specified aftercare: Secondary | ICD-10-CM

## 2013-05-09 DIAGNOSIS — L0390 Cellulitis, unspecified: Secondary | ICD-10-CM

## 2013-05-09 DIAGNOSIS — L0291 Cutaneous abscess, unspecified: Secondary | ICD-10-CM

## 2013-05-09 NOTE — Progress Notes (Signed)
Subjective:    Patient ID: Phillip [name], male    DOB: 1955/06/25, 58 y.o.   MRN: [id number]  HPI This chart was scribed for [facility] by Celesta Gentile, Scribe. This patient was seen in room 5 and the patient's care was started at 5:50 PM.  HPI Comments: Phillip [name] is a 58 y.o. male who presents to the [facility] for a follow-up from a visit yesterday for an abrasion on his right arm from a deer antler.  Pt was pulling a dead deer out of a body of water when the incident occurred.  Pt states the redness has ventured out of the lines previously marked on his arm.  The redness that has moved outside the marked lines appears as blood from a bruise.  Pt denies fever, chills, vomiting, and diarrhea.   Pt states he has tried warm compresses.  He was placed on Doxycyline and is currently still taking it.       History reviewed. No pertinent past surgical history.  Family History  Problem Relation Age of Onset  . Hyperlipidemia Father   . Heart disease Maternal Grandfather   . Cancer Paternal Grandfather     History   Social History  . Marital Status: Married    Spouse Name: N/A    Number of Children: N/A  . Years of Education: N/A   Occupational History  . Not on file.   Social History Main Topics  . Smoking status: Former Research scientist (life sciences)  . Smokeless tobacco: Not on file  . Alcohol Use: Yes     Comment: 8 drinks  . Drug Use: No  . Sexual Activity: Not on file   Other Topics Concern  . Not on file   Social History Narrative   Divorced. Education: [employer].    Allergies  Allergen Reactions  . Penicillins Hives    Patient Active Problem List   Diagnosis Date Noted  . Cognitive impairment 01/31/2013  . 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 ADD (attention deficit disorder) 03/10/2012  . Depression with anxiety 03/10/2012    Results for orders placed in visit on 05/07/13  POCT CBC      Result Value Range   WBC 5.4  4.6 - 10.2 K/uL   Lymph, poc  1.3  0.6 - 3.4   POC LYMPH PERCENT 23.5  10 - 50 %L   MID (cbc) 0.7  0 - 0.9   POC MID % 12.2 (*) 0 - 12 %M   POC Granulocyte 3.5  2 - 6.9   Granulocyte percent 64.3  37 - 80 %G   RBC 4.53 (*) 4.69 - 6.13 M/uL   Hemoglobin 14.3  14.1 - 18.1 g/dL   HCT, POC 44.9  43.5 - 53.7 %   MCV 99.2 (*) 80 - 97 fL   MCH, POC 31.6 (*) 27 - 31.2 pg   MCHC 31.8  31.8 - 35.4 g/dL   RDW, POC 13.4     Platelet Count, POC 294  142 - 424 K/uL   MPV 7.9  0 - 99.8 fL     Review of Systems  Constitutional: Negative for fever and chills.  HENT: Negative for congestion and rhinorrhea.   Respiratory: Negative for cough and shortness of breath.   Cardiovascular: Negative for chest pain.  Gastrointestinal: Negative for nausea, vomiting, abdominal pain and diarrhea.  Musculoskeletal: Negative for back pain.  Skin: Positive for wound (Right forearm). Negative for color change and rash.  Neurological: Negative  for syncope.       Objective:   Physical Exam  Nursing note and vitals reviewed. Constitutional: He is oriented to person, place, and time. He appears well-developed and well-nourished. No distress.  HENT:  Head: Normocephalic and atraumatic.  Eyes: Conjunctivae are normal. Right eye exhibits no discharge. Left eye exhibits no discharge.  Neck: Normal range of motion.  Cardiovascular: Normal rate.   Pulmonary/Chest: Effort normal. No respiratory distress.  Musculoskeletal: Normal range of motion.  There is no bony tenderness in the area of the wound. The wrist moves freely. There is no tendonitis on the volar aspect.  Neurological: He is alert and oriented to person, place, and time.  Skin: Skin is warm and dry.  Large area of ecchymosis on right forearm with no evidence of spreading cellulitis.  Psychiatric: He has a normal mood and affect. His behavior is normal.   Triage Vitals: BP 118/72  Pulse 72  Temp(Src) 97.8 F (36.6 C) (Oral)  Resp 18  Ht 6' (1.829 m)  Wt 179 lb (81.194 kg)   BMI 24.27 kg/m2  SpO2 100%  DIAGNOSTIC STUDIES: Oxygen Saturation is 100% on RA, normal by my interpretation.    COORDINATION OF CARE: 5:53 PM-Informed pt to finish prescription of doxycycline.  Patient informed of current plan of treatment and evaluation and agrees with plan.       Assessment & Plan:  Cellulitis--resp to doxy-should resolve over 2 weeks  Contusion, forearm, right, subsequent encounter    I have completed the patient encounter in its entirety as documented by the scribe, with editing by me where necessary. Collyns Mcquigg P. [name], M.D.

## 2013-05-11 ENCOUNTER — Ambulatory Visit (INDEPENDENT_AMBULATORY_CARE_PROVIDER_SITE_OTHER): Payer: BC Managed Care – PPO | Admitting: Neurology

## 2013-05-11 ENCOUNTER — Other Ambulatory Visit: Payer: Self-pay | Admitting: Neurology

## 2013-05-11 ENCOUNTER — Encounter: Payer: Self-pay | Admitting: Neurology

## 2013-05-11 VITALS — BP 132/88 | HR 72 | Temp 97.9°F | Ht 72.0 in

## 2013-05-11 DIAGNOSIS — G4761 Periodic limb movement disorder: Secondary | ICD-10-CM

## 2013-05-11 MED ORDER — ROPINIROLE HCL 0.25 MG PO TABS: ORAL_TABLET | ORAL | Status: DC

## 2013-05-11 NOTE — Patient Instructions (Addendum)
We will start you on a medication to help with your leg movements in sleep to see if this improves your sleep consolidation: Requip (generic name: ropinirole) 0.25 mg: Take one pill about 90 minutes before bedtime for one week, then 2 pills each night for one week, then 3 pills each night thereafter. Common side effects reported are: Sedation, sleepiness, nausea, vomiting, and rare side effects are confusion, hallucinations, swelling in legs, and abnormal behaviors, including impulse control problems, which can manifest as excessive eating, obsessions with food or gambling, or hypersexuality. We will also do blood work today especially to look at are in efficiency. If you have iron deficiency or even anemia you may need further workup for this.

## 2013-05-11 NOTE — Progress Notes (Signed)
Subjective:    Patient ID: Phillip [name] is a 58 y.o. male.  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 He is accompanied by his friend again today. I first met him on 03/22/2013, and which time I suggested a nocturnal polysomnogram as well as a nap study. He ended up having a baseline sleep study on 04/07/2013 and I went over his test results with him in detail today. His sleep efficiency was mildly reduced at 88.4% with a latency to sleep of 30.5 minutes and wake after sleep onset of 21 minutes with moderate sleep fragmentation noted. The arousal index was elevated at 21.6 arousals per hour, due primarily to periodic leg movements in sleep. He had increased percentages of stage I and stage II sleep, near absence of slow wave sleep, and decreased percentage of REM sleep at 14.6% with a mildly prolonged REM latency of 128 minutes. He was noted to have severe periodic leg movements at 67.1 per hour resulting at 13.6 arousals per hour. He had no significant snoring and no apneas or hypopneas were noted. His AHI was 0 per hour. His baseline oxygen saturation was 95%, his nadir was 80% during wakefulness and his true sleep-related desaturation nadir appear to be 91%. He was not of the nap study the next day due to significant periodic leg movements of sleep noted with arousals.  Today, he reports no Sx of RLS and no Hx of anemia, no Hx of blood in urine or stool. He developed a bruise on his right forearm when he was handling a dead deer and the antler poked him into the right forearm. The bruise is still visible. He never had an open wound. HEENT was given a  prescription to treat cellulitis but did not have to take it. He is no longer taking trazodone as he had side effects with it, but he still takes the Ativan at night but would like to be able to come off of it eventually, as he wonders if it contributes to his cognitive impairment. He has no other new complaints today. He has started biofeedback.  He had a normal CBC, normal CMP, lipid panel showed increased cholesterol at 256 and LDL increased at 172, TSH was normal, PSA was normal, urinalysis was negative. For years, he has been tried on different stimulants, antidepressants and sleep aids. He was tried on dementia medicine, he was tested with cognitive tests. He is followed at the [facility] in [place]. He has tried [place] in the past. He has tried OTC medications for sleep: Sound Asleep, which contains chamomile and valerian, he has tried melatonin. He is not currently sleeping with anyone, and has been living alone for 4 years. He has 2 children, ages 36 and 84. He is divorced, but sees his children every day and that is not a source of stress for him. He had ADD before his encephalitis. He has gone 2-3 days without sleep he states. He denies sleep attacks. He has never had CBT or biofeedback. He has seen counselors over the years. He has been to neuropsychologists at [facility] and [facility].  He has no scheduled wake time and bedtime. He was placed on Trazodone inthe recent past by his  PCP and had some nightmares. He cannot nap. He was advised to have a sleep study years ago as per recommendation of his neuropsychologists.  He denies cataplexy, sleep paralysis, hypnagogic or hypnopompic hallucinations, or sleep attacks. He does not report any vivid dreams, nightmares, dream enactments, or parasomnias, such as sleep talking or sleep walking. The patient has not had a sleep study or a home sleep test. He does not endorse much in the way of caffeine intake. He tries to take care of his diet and exercises  regularly.  His Past Medical History Is Significant For: Past Medical History  Diagnosis Date  . Depression   . Encephalitis   . Anxiety    His Past Surgical History Is Significant For: History reviewed. No pertinent past surgical history.  His Family History Is Significant For: Family History  Problem Relation Age of Onset  . Hyperlipidemia Father   . Heart disease Maternal Grandfather   . Cancer Paternal Grandfather    His Social History Is Significant For: History   Social History  . Marital Status: Married    Spouse Name: N/A    Number of Children: N/A  . Years of Education: N/A   Social History Main Topics  . Smoking status: Former Research scientist (life sciences)  . Smokeless tobacco: None  . Alcohol Use: Yes     Comment: 8 drinks  . Drug Use: No  . Sexual Activity: None   Other Topics Concern  . None   Social History Narrative   Divorced. Education: [employer].   His Allergies Are:  Allergies  Allergen Reactions  . Penicillins Hives   His Current Medications Are:  Outpatient Encounter Prescriptions as of 05/11/2013  Medication Sig  . ADDERALL 30 MG tablet Take 1 tablet (30 mg total) by mouth 3 (three) times daily. 05/06/13  . doxycycline (VIBRAMYCIN) 100 MG capsule Take 1 capsule (100 mg total) by mouth 2 (two) times daily.  Marland Kitchen LORazepam (ATIVAN) 2 MG tablet TAKE ONE TABLET AT BEDTIME.  Marland Kitchen Omega-3 Fatty Acids (FISH OIL PO) Take 1 capsule by mouth daily.  Marland Kitchen OVER THE COUNTER MEDICATION OTC Vitamin D3 2000 iu taking 1 daily  . OVER THE COUNTER MEDICATION Neuro 2 taking 2 a day  . OVER THE COUNTER MEDICATION Neuroflam 2 a day  . OVER THE COUNTER MEDICATION Neurovite Plus taking 2 a day  . OVER THE COUNTER MEDICATION Brain/Memory taking 2 a day  . sildenafil (VIAGRA) 100 MG tablet Take 0.5-1 tablets (50-100 mg total) by mouth daily as needed for erectile dysfunction.  . traZODone (DESYREL) 50 MG tablet Take 0.5-1 tablets (25-50 mg total) by mouth at bedtime as needed for sleep.   Review  of Systems:  Out of a complete 14 point review of systems, all are reviewed and negative with the exception of these symptoms as listed below:  Review of Systems  Constitutional: Positive for fatigue.  HENT: Negative.   Eyes: Negative.   Respiratory: Negative.   Cardiovascular: Negative.   Gastrointestinal: Negative.   Endocrine: Negative.   Genitourinary: Negative.   Musculoskeletal: Negative.   Skin: Negative.   Allergic/Immunologic: Negative.   Neurological:       Memory loss  Hematological: Negative.   Psychiatric/Behavioral: Positive for sleep disturbance (restless leg, insomnia, frequent waking).    Objective:  Neurologic Exam  Physical Exam Physical Examination:   Filed Vitals:   05/11/13 1038  BP: 132/88  Pulse: 72  Temp: 97.9 F (36.6 C)    General Examination: The patient is  a very pleasant 58 y.o. male in no acute distress. He appears well-developed and well-nourished and well groomed.   HEENT: Normocephalic, atraumatic, pupils are equal, round and reactive to light and accommodation. Funduscopic exam is normal with sharp disc margins noted. Extraocular tracking is good without limitation to gaze excursion or nystagmus noted. Normal smooth pursuit is noted. Hearing is grossly intact. Face is symmetric with normal facial animation and normal facial sensation. Speech is clear with no dysarthria noted. There is no hypophonia. There is no lip, neck/head, jaw or voice tremor. Neck is supple with full range of passive and active motion. There are no carotid bruits on auscultation. Oropharynx exam reveals: mild mouth dryness, good dental hygiene and no significant airway crowding and absent tonsils. Mallampati is class I. Tongue protrudes centrally and palate elevates symmetrically. Neck size is 15 7/8 inches.   Chest: Clear to auscultation without wheezing, rhonchi or crackles noted.  Heart: S1+S2+0, regular and normal without murmurs, rubs or gallops noted.   Abdomen:  Soft, non-tender and non-distended with normal bowel sounds appreciated on auscultation.  Extremities: There is no pitting edema in the distal lower extremities bilaterally. Pedal pulses are intact.  Skin: Warm and dry without trophic changes noted. There are no varicose veins.  Musculoskeletal: exam reveals no obvious joint deformities, tenderness or joint swelling or erythema.   Neurologically:  Mental status: The patient is awake, alert and oriented in all 4 spheres. His memory, attention, language and knowledge are mildly impaired. He has difficulty giving a concise and chronological history. He has some word finding difficulties. His friend helps with the history. speech is clear with normal prosody and enunciation. Thought process is linear. Mood is congruent and affect is normal.  Cranial nerves are as described above under HEENT exam. In addition, shoulder shrug is normal with equal shoulder height noted. Motor exam: Normal bulk, strength and tone is noted. There is no drift, tremor or rebound. Romberg is negative. Reflexes are 2+ throughout. Toes are downgoing bilaterally. Fine motor skills are intact with normal finger taps, normal hand movements, normal rapid alternating patting, normal foot taps and normal foot agility.  Cerebellar testing shows no dysmetria or intention tremor on finger to nose testing. Heel to shin is unremarkable bilaterally. There is no truncal or gait ataxia.  Sensory exam is intact to light touch, pinprick, vibration, temperature sense in the upper and lower extremities.  Gait, station and balance are unremarkable. No veering to one side is noted. No leaning to one side is noted. Posture is age-appropriate and stance is narrow based. No problems turning are noted. He turns en bloc. Tandem walk is unremarkable. Intact toe and heel stance is noted.               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 He reports severe difficulty with his sleep including disturbed sleep, nonrestorative sleep, sleep maintenance and sleep initiation issues for years. His exam is stable. He had had a recent polysomnogram which did not show any sleep disordered breathing. He had evidence of severe periodic leg movements of sleep with arousals, causing sleep disruption, but denies any restless leg symptoms. Nevertheless, I do believe that his sleep disorder is in part caused by significant periodic leg movements of sleep and we discussed today the possibility of trying to  tone down his leg movements and hopefully consolidate his sleep by using a dopamine agonists. I suggested a trial of Requip starting at 0.25 mg strength once each night for one week and then increase this in weekly increments to up to 0.75 mg each night. He is advised to take the medicine 90 minutes before his bedtime. He does not have a history of blood loss or anemia and his most recent CBC was unremarkable. I would like to check his iron status with ferritin and TIBC. He will get this done today and we will call him with the test results. He is advised to followup with me in 3 months and call with any interim questions, concerns, problems or updates. He has started biofeedback.   Most of my 25 minute visit today was spent in counseling and coordination of care, reviewing test results and reviewing medication.

## 2013-05-16 ENCOUNTER — Telehealth: Payer: Self-pay

## 2013-05-16 NOTE — Telephone Encounter (Signed)
Pt's emer contact (on HIPPA) called to check on what is needed for adderall to be filled for #90/mos. Had received PA req from pharm and explained process. Completed PA on covermymeds.

## 2013-05-18 NOTE — Telephone Encounter (Signed)
PA approved through 05/15/14. Notified pharm and pt.

## 2013-05-30 MED ORDER — ADDERALL 30 MG PO TABS
30.0000 mg | ORAL_TABLET | Freq: Three times a day (TID) | ORAL | Status: DC
Start: 2013-05-30 — End: 2013-09-03

## 2013-05-30 NOTE — Addendum Note (Signed)
Addended by: Elwyn Reach A on: 05/30/2013 12:52 PM   Modules accepted: Orders

## 2013-05-30 NOTE — Telephone Encounter (Signed)
Phillip [name] from [facility] [address] called and stated that when pt tried to fill Adderall Rx on 05/13/13, ins would only pay for #60 w/out PA, so they dispensed #60 and req'd that I call ins to see if they will backdate the PA to the 9th so that they can re-run entire Rx through on date of 9th (but only dispense the remaining #30). By law, they are not allowed to run the same Rx through on a different date. I called ins and they advised that they never backdate PA's. Pharmacist stated the only other thing they can do to have ins cover the remaining #30 is for Dr [name] to write another Rx with same sig (1 tab TID) but for only #30 tablets. I have pended this for review.

## 2013-05-30 NOTE — Addendum Note (Signed)
Addended by: Leandrew Koyanagi on: 05/30/2013 06:43 PM   Modules accepted: Orders

## 2013-05-31 ENCOUNTER — Telehealth: Payer: Self-pay | Admitting: Neurology

## 2013-05-31 NOTE — Telephone Encounter (Signed)
LMOM Rx is ready.

## 2013-06-01 ENCOUNTER — Ambulatory Visit: Payer: BC Managed Care – PPO | Admitting: Internal Medicine

## 2013-06-01 NOTE — Telephone Encounter (Signed)
Called to get more information about labs, (date of lab results)

## 2013-06-02 NOTE — Telephone Encounter (Signed)
Called and left message for date of labs

## 2013-06-03 LAB — IRON AND TIBC
Iron Saturation: 17 % (ref 15–55)
Iron: 64 ug/dL (ref 40–155)
TIBC: 375 ug/dL (ref 250–450)
UIBC: 311 ug/dL (ref 150–375)

## 2013-06-03 LAB — FERRITIN: FERRITIN: 116 ng/mL (ref 30–400)

## 2013-06-03 NOTE — Progress Notes (Signed)
Quick Note:  Shared normal labs with patient per Dr Guadelupe Sabin findings, he verbalized understanding ______

## 2013-06-03 NOTE — Progress Notes (Signed)
Quick Note:  Please call patient and tell him that his labs looked fine, we looked at iron studies. Star Age, MD, PhD [facility]  ______

## 2013-07-13 ENCOUNTER — Telehealth: Payer: Self-pay

## 2013-07-13 MED ORDER — ADDERALL 30 MG PO TABS
30.0000 mg | ORAL_TABLET | Freq: Three times a day (TID) | ORAL | Status: DC
Start: 2013-07-13 — End: 2013-07-28

## 2013-07-13 NOTE — Telephone Encounter (Signed)
Med refills - ADDERALL 30 MG tablet   [phone]

## 2013-07-13 NOTE — Telephone Encounter (Signed)
Meds ordered this encounter  Medications  . ADDERALL 30 MG tablet    Sig: Take 1 tablet (30 mg total) by mouth 3 (three) times daily.    Dispense:  90 tablet    Refill:  0

## 2013-07-15 NOTE — Telephone Encounter (Signed)
LMOM that Rx is ready for p/up 

## 2013-07-28 ENCOUNTER — Telehealth: Payer: Self-pay

## 2013-07-28 MED ORDER — ADDERALL 30 MG PO TABS: 30.0000 mg | ORAL_TABLET | Freq: Three times a day (TID) | ORAL | Status: DC

## 2013-07-28 NOTE — Telephone Encounter (Signed)
Ok Meds ordered this encounter  Medications  . ADDERALL 30 MG tablet    Sig: Take 1 tablet (30 mg total) by mouth 3 (three) times daily.    Dispense:  21 tablet    Refill:  0

## 2013-07-28 NOTE — Telephone Encounter (Signed)
Pts insurance has been d/c, and is trying to get insurance re-instated.  Does not want to pay $300 for his adderall. Would like a refill for a smaller amount, and if insurance goes into effect today or tomorrow the rx for the smaller amount can then be shred and he will p/u the full rx written on 07/13/13.  Not aware of how many he has left. Insurance is going to send something by email, in an attempt to re-instate coverage this week hopefully today or tomorrow.  Please advise.

## 2013-07-28 NOTE — Telephone Encounter (Signed)
Dr.Doolittle, pts persinal assistant is calling to let you know that he is in a situation with his adderall, he is out and cannot get the rx filled due to an insurance issue. Best# [phone] or [phone]

## 2013-07-29 MED ORDER — ADDERALL 30 MG PO TABS: 30.0000 mg | ORAL_TABLET | Freq: Three times a day (TID) | ORAL | Status: DC

## 2013-07-29 NOTE — Telephone Encounter (Signed)
Spoke to [name] (Materials engineer), she is aware rx will be waiting at 102 for her p/u. She states the insurance will hopefull be re-instated today.

## 2013-07-29 NOTE — Telephone Encounter (Signed)
Rx in drawer for p/up. 

## 2013-08-02 ENCOUNTER — Telehealth: Payer: Self-pay

## 2013-08-02 MED ORDER — ADDERALL 30 MG PO TABS
30.0000 mg | ORAL_TABLET | Freq: Three times a day (TID) | ORAL | Status: DC
Start: 2013-08-02 — End: 2013-08-31

## 2013-08-02 NOTE — Telephone Encounter (Signed)
LMOM that rx is up front for p/u 

## 2013-08-02 NOTE — Telephone Encounter (Signed)
Patient is trying to get his insurance reinstated.  Until then he will need a partial on Adderall.  With keep up posted on the insurance information.  [place]  Sherrilee Gilles

## 2013-08-02 NOTE — Telephone Encounter (Signed)
written

## 2013-08-02 NOTE — Telephone Encounter (Signed)
Meds ordered this encounter  Medications  . ADDERALL 30 MG tablet    Sig: Take 1 tablet (30 mg total) by mouth 3 (three) times daily.    Dispense:  21 tablet    Refill:  0

## 2013-08-31 ENCOUNTER — Telehealth: Payer: Self-pay

## 2013-08-31 NOTE — Telephone Encounter (Signed)
KAREN STATES PT IS IN NEED OF HIS ADDERALL 20MG S AND WOULD LIKE TO KNOW IF THEY CAN PICK UP ON Monday [place]ula [phone]

## 2013-08-31 NOTE — Telephone Encounter (Signed)
LMOM for Phillip [name] to CB to clarify req'd dose of Adderall. Our records show pt is taking 30 mg, not 20 mg as req'd in message.

## 2013-09-01 NOTE — Telephone Encounter (Signed)
[name] CB and verified that pt does take the 30 mg TID. Pended for review.

## 2013-09-03 MED ORDER — ADDERALL 30 MG PO TABS: 30.0000 mg | ORAL_TABLET | Freq: Three times a day (TID) | ORAL | Status: DC

## 2013-09-03 NOTE — Telephone Encounter (Signed)
May pick up  Meds ordered this encounter  Medications  . ADDERALL 30 MG tablet    Sig: Take 1 tablet (30 mg total) by mouth 3 (three) times daily.    Dispense:  90 tablet    Refill:  0  . ADDERALL 30 MG tablet    Sig: Take 1 tablet (30 mg total) by mouth 3 (three) times daily. For 30 d after signed    Dispense:  90 tablet    Refill:  0    Then needs f/u so schedule now for 2 mos

## 2013-09-04 NOTE — Telephone Encounter (Signed)
Advised pt rx in pick up drawer. 

## 2013-09-06 ENCOUNTER — Telehealth: Payer: Self-pay | Admitting: Neurology

## 2013-09-06 NOTE — Telephone Encounter (Signed)
Patient's friend [name] calling to state that ever since starting new medication Ropinirole, patient cannot wake up and is excessively groggy and agitated. [name] states that he also takes the highest dose of Adderall and it is having no effect. Please call and advise.

## 2013-09-07 NOTE — Telephone Encounter (Signed)
I spoke with Ms. Phillip [name] and relayed Dr. Guadelupe Sabin instructions to gradually reduce Mr. Phillip [name] dosage.  Ms. Phillip [name] stated that he is only taking one (1) each night because he started the regimen late.  She stated that Mr. Phillip [name] will reduce the dosage to 1/2 tablet at night and if he is able to tolerate this, he will then move to 1 tablet and gradually increase to the prescribed dosage; if not, Mr. Phillip [name] will discontinue the medication and she will notify us.

## 2013-09-22 ENCOUNTER — Ambulatory Visit: Payer: BC Managed Care – PPO | Admitting: Neurology

## 2013-10-12 ENCOUNTER — Telehealth: Payer: Self-pay | Admitting: Internal Medicine

## 2013-10-12 NOTE — Telephone Encounter (Signed)
Patient's friend left a message on the disabilities VM stating that patient needs a detailed letter written explaining what his diagnosis is and what his limitations are. Patient needs it for a court date. He has an appointment on 10/19/2013 with [name] however he needs the letter sooner if possible. Last OV was 05/09/2013.

## 2013-10-14 NOTE — Telephone Encounter (Signed)
Tried to call patient but mailbox was full and could not leave a message.

## 2013-10-14 NOTE — Telephone Encounter (Signed)
I am unsure what limitations he has at this point. He has not been seen here since 05/2013 for a cellulitis that should not be causing him any residual limitations. If so, he would need to follow up on this. Maybe Dr. [name] has more information about this and can complete it for him.

## 2013-10-19 ENCOUNTER — Encounter: Payer: Self-pay | Admitting: Internal Medicine

## 2013-10-19 ENCOUNTER — Ambulatory Visit (INDEPENDENT_AMBULATORY_CARE_PROVIDER_SITE_OTHER): Payer: BC Managed Care – PPO | Admitting: Internal Medicine

## 2013-10-19 VITALS — BP 118/80 | HR 75 | Temp 98.6°F | Resp 16 | Ht 72.0 in | Wt 181.8 lb

## 2013-10-19 DIAGNOSIS — R4189 Other symptoms and signs involving cognitive functions and awareness: Secondary | ICD-10-CM

## 2013-10-19 DIAGNOSIS — F09 Unspecified mental disorder due to known physiological condition: Secondary | ICD-10-CM

## 2013-10-19 DIAGNOSIS — F418 Other specified anxiety disorders: Secondary | ICD-10-CM

## 2013-10-19 DIAGNOSIS — G479 Sleep disorder, unspecified: Secondary | ICD-10-CM

## 2013-10-19 DIAGNOSIS — F988 Other specified behavioral and emotional disorders with onset usually occurring in childhood and adolescence: Secondary | ICD-10-CM

## 2013-10-19 DIAGNOSIS — F341 Dysthymic disorder: Secondary | ICD-10-CM

## 2013-10-19 MED ORDER — GABAPENTIN 300 MG PO CAPS: 300.0000 mg | ORAL_CAPSULE | Freq: Every day | ORAL | Status: DC

## 2013-10-19 MED ORDER — LORAZEPAM 2 MG PO TABS: ORAL_TABLET | ORAL | Status: DC

## 2013-10-19 MED ORDER — ADDERALL 30 MG PO TABS: 30.0000 mg | ORAL_TABLET | Freq: Three times a day (TID) | ORAL | Status: DC

## 2013-10-19 MED ORDER — AMPHETAMINE-DEXTROAMPHETAMINE 30 MG PO TABS: 30.0000 mg | ORAL_TABLET | Freq: Three times a day (TID) | ORAL | Status: DC

## 2013-10-19 NOTE — Progress Notes (Signed)
This chart was scribed for Leandrew Koyanagi, MD by Einar Pheasant, ED Scribe. This patient was seen in room 24 and the patient's care was started at 2:58 PM.  Subjective:    Patient ID: Phillip [name], male    DOB: 12-05-1955, 58 y.o.   MRN: [id number]  Chief Complaint  Patient presents with  . Follow-up    MEMORY AND CELLULITIS    HPI HPI Comments: Phillip [name] is a 58 y.o. male who presents to the [facility] for a follow up.  Pt states that he went to the sleep lab and they discovered that when he goes to sleep his feet move around a lot. They also noticed that every 60 seconds he woke up from deep sleep. He believes that the inability to stay in deep sleep for long periods of time may be a contributor to his memory loss. There was no sleep apnea found. Dr. Rexene Alberts started the pt on Requip and possibly Mirapex. However, he states that the drug made him really groggy, with irritability and agitation during the daytime so he had to stop. Discussed the possible effects of his sleep problems on his chronic psychological difficulties.  Pt states that he is in the process of a divorce. He states that its been stressful. Pt states that he sees it getting "messy" on the part of his ex-wife.  But the kids are hanging in there. He states that when he encountered his injury in 2000 his wife was not supportive of him. She thought that his symptoms were just a figment of his imagination.   Pt states that the adderall is keeping him awake during the day.  Pt states that the bruise that was located on his right arm has resolved.   He is requesting a one page note from me explaining the his disorder, what is is and how it affects his everyday life, to help his lawyers with a divorce process  Patient Active Problem List   Diagnosis Date Noted  . Cognitive impairment 01/31/2013  . 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 ADD (attention deficit disorder) 03/10/2012  . 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 Depression   . Encephalitis   . Anxiety    No past surgical history on file. Allergies  Allergen Reactions  . Penicillins Hives   Prior to Admission medications   Medication Sig Start Date End Date Taking? Authorizing [place]  ADDERALL 30 MG tablet Take 1 tablet (30 mg total) by mouth 3 (three) times daily. 09/03/13  Yes Leandrew Koyanagi, MD  ADDERALL 30 MG tablet Take 1 tablet (30 mg total) by mouth 3 (three) times daily. For 30 d after signed 09/03/13  Yes Leandrew Koyanagi, MD  LORazepam (ATIVAN) 2 MG tablet TAKE ONE TABLET AT BEDTIME. 04/21/13  Yes Leandrew Koyanagi, MD  Omega-3 Fatty Acids (FISH OIL PO) Take 1 capsule by mouth daily.   Yes Historical Shallen Luedke, MD  OVER THE COUNTER MEDICATION OTC Vitamin D3 2000 iu taking 1 daily   Yes Historical Cadance Raus, MD  [place] Neuro 2 taking 2 a day   Yes Historical Hadasa Gasner, MD  OVER THE COUNTER MEDICATION Neuroflam 2 a day   Yes Historical Aldene Hendon, MD  OVER THE COUNTER MEDICATION Neurovite Plus taking 2 a day   Yes Historical Carlee Tesfaye, MD  OVER THE COUNTER MEDICATION  Brain/Memory taking 2 a day   Yes Historical Silvestre Mines, MD  doxycycline (VIBRAMYCIN) 100 MG capsule Take 1 capsule (100 mg total) by mouth 2 (two) times daily. 05/07/13   Heather Elnora Morrison, PA-C  rOPINIRole (REQUIP) 0.25 MG tablet 1 pill 90 minutes before bedtime for one week, then 2 pills each night for one week, then 3 pills each night thereafter. 05/11/13   Star Age, MD  sildenafil (VIAGRA) 100 MG tablet Take 0.5-1 tablets (50-100 mg total) by mouth daily as needed for erectile dysfunction. 06/23/12   Leandrew Koyanagi, MD  traZODone (DESYREL) 50 MG tablet Take 0.5-1 tablets (25-50 mg total) by mouth at bedtime as needed for sleep. 03/16/13   Leandrew Koyanagi, MD   History   Social History  . Marital Status: Married    Spouse Name: N/A     Number of Children: N/A  . Years of Education: N/A   Occupational History  . Not on file.   Social History Main Topics  . Smoking status: Former Research scientist (life sciences)  . Smokeless tobacco: Not on file  . Alcohol Use: Yes     Comment: 8 drinks  . Drug Use: No  . Sexual Activity: Not on file   Other Topics Concern  . Not on file   Social History Narrative   Divorced. Education: [employer].    Review of Systems  Constitutional: Negative for fever, activity change, appetite change and unexpected weight change.  Eyes: Negative.  Negative for visual disturbance.  Respiratory: Negative for chest tightness and shortness of breath.   Cardiovascular: Negative for chest pain and palpitations.  Gastrointestinal: Negative for nausea and abdominal pain.  Genitourinary: Negative.  Negative for difficulty urinating.  Musculoskeletal: Negative for arthralgias, joint swelling and neck pain.  Skin: Negative.  Negative for rash and wound.  Neurological: Negative for dizziness, speech difficulty, weakness, light-headedness, numbness and headaches.  Psychiatric/Behavioral: Positive for confusion, sleep disturbance, dysphoric mood, decreased concentration and agitation. Negative for suicidal ideas, hallucinations, behavioral problems and self-injury. The patient is nervous/anxious.        Objective:   Physical Exam  Nursing note and vitals reviewed. Constitutional: He is oriented to person, place, and time. He appears well-developed and well-nourished. No distress.  HENT:  Head: Normocephalic and atraumatic.  Eyes: Conjunctivae and EOM are normal. Pupils are equal, round, and reactive to light.  Neck: Normal range of motion. Neck supple.  Cardiovascular: Normal rate, regular rhythm and normal heart sounds.  Exam reveals no gallop and no friction rub.   No murmur heard. Pulmonary/Chest: Effort normal and breath sounds normal.  Musculoskeletal: He exhibits no edema and no tenderness.  Neurological: He is  alert and oriented to person, place, and time. No cranial nerve deficit. Coordination normal.  Skin: Skin is warm and dry.  Psychiatric:  As his usual during the office exam his mood is upbeat, his affect is appropriate, his thought content is normal the his responses can be slow and convoluted at times. He defers in decision making to his partner who is with him during the exam. This also is consistent with past.      Filed Vitals:   10/19/13 1424  BP: 118/80  Pulse: 75  Temp: 98.6 F (37 C)  TempSrc: Oral  Resp: 16  Height: 6' (1.829 m)  Weight: 181 lb 12.8 oz (82.464 kg)  SpO2: 98%     Assessment & Plan:  ADD (attention deficit disorder)  Cognitive impairment  Depression with anxiety  Sleep disturbance, RLS  The addition of this sleep disturbance to his diagnoses provide some hope that he can improve his cognitive function if we can resolve chronic sleep deprivation. The issue of sleep disturbance was first raised to 3 years ago but he was unwilling at that time to pursue nocturnal polysomnography because he had no snoring and was not overweight.  Since he has not responded well to antiparkinsonian drugs we will start Neurontin and advance as needed He will  He will record His restless leg movement with a web cam to provide information about any improvement  He will continue lorazepam at bedtime as this has improved his ability to fall asleep without his mind racing about his multiple issues.  Adderall will be continued   Meds ordered this encounter  Medications  . LORazepam (ATIVAN) 2 MG tablet    Sig: TAKE ONE TABLET AT BEDTIME.    Dispense:  30 tablet    Refill:  5  . gabapentin (NEURONTIN) 300 MG capsule    Sig: Take 1-2 capsules (300-600 mg total) by mouth at bedtime.    Dispense:  30 capsule    Refill:  3  . ADDERALL 30 MG tablet    Sig: Take 1 tablet (30 mg total) by mouth 3 (three) times daily. For 30 d after signed    Dispense:  90 tablet    Refill:  0   . ADDERALL 30 MG tablet    Sig: Take 1 tablet (30 mg total) by mouth 3 (three) times daily.    Dispense:  90 tablet    Refill:  0  . amphetamine-dextroamphetamine (ADDERALL) 30 MG tablet    Sig: Take 1 tablet (30 mg total) by mouth 3 (three) times daily. For 60 days after signed    Dispense:  90 tablet    Refill:  0    I personally performed the services described in this documentation, which was scribed in my presence. The recorded information has been reviewed and is accurate.

## 2013-10-20 ENCOUNTER — Telehealth: Payer: Self-pay | Admitting: *Deleted

## 2013-10-20 NOTE — Telephone Encounter (Signed)
Pt has been seen by Dr. [name] 6/17

## 2013-10-20 NOTE — Telephone Encounter (Signed)
Left voicemail for pt to contact our office, letter from Dr. [name] ready for pickup.

## 2013-10-24 ENCOUNTER — Telehealth: Payer: Self-pay

## 2013-10-24 NOTE — Telephone Encounter (Signed)
LMVM to CB. 

## 2013-10-24 NOTE — Telephone Encounter (Signed)
KAREN STATES PT WAS GIVEN HIS LORAZEPAM FROM DR [name] AND WHEN HE GOT IT FILLED, IT HAD COME FROM A DIFFERENT MAKER AND DOESN'T WORK AS WELL, WOULD LIKE TO SPEAK WITH SOMEONE. PLEASE CALL [phone]

## 2013-10-25 NOTE — Telephone Encounter (Signed)
Pt's wife said she spoke to pharm and they told her that they would exchange the manufacturer for her if she return what tablets she had. She said she would try this and let us know if she has any other problems.

## 2013-10-25 NOTE — Telephone Encounter (Signed)
Spoke to [employer].  She states that husband has changed pharmacies and they are using a different manufacturer than he is accustomed to.  With the new RX he is unable to sleep. She placed a call to the pharmacy and they are going to call her back to see if there is anything they can do. If there is not, she would like to know if we can re-write the RX so she can take it to the other pharmacy.  I told her to just call us back when she is sure. She said she would do so.

## 2013-11-07 ENCOUNTER — Telehealth: Payer: Self-pay

## 2013-11-07 DIAGNOSIS — Z1211 Encounter for screening for malignant neoplasm of colon: Secondary | ICD-10-CM

## 2013-11-07 NOTE — Telephone Encounter (Signed)
Ok to place referral.

## 2013-11-07 NOTE — Telephone Encounter (Signed)
Patient's friend/personal assistant called regarding a colonoscopy for patient. States that on his last visit, Dr. [name] stated that we would refer patient for a colonoscopy. [name] states she nor Drevin have heard anything regarding that referral.

## 2013-11-07 NOTE — Telephone Encounter (Signed)
[name], pt's emerg contact/asst, called and LM on VM that there is a problem with the PA on his Adderall. According to notes on 05/16/13 phone mes, PA was approved through next Jan. LMOM for [name] to [facility] w/details on problem.

## 2013-11-08 NOTE — Telephone Encounter (Signed)
Referred for colonos

## 2013-11-09 NOTE — Telephone Encounter (Signed)
Notified [name], asst/emer cont, that referral has been ordered and they will hear from GI or our Referral dept when appt set up.

## 2013-11-09 NOTE — Telephone Encounter (Signed)
Called pharmacist who reported that the Adderall HAD been covered by ins, but was coming up w/about a $400 co-pay. Phillip [name] of this who stated that this was the price in Jan when the ins was not covering it. I advised Phillip [name] that a prior Phillip [name] was approved for the name brand Adderall for #90 per month that is good through 05/16/13 (see notes under 05/16/13 phone message) and that Phillip [name] should call cust service on ins card and give them this info to see if they can explain what is going on. Phillip [name] agreed and will CB if there is anything else that I need to do for them.

## 2013-11-17 ENCOUNTER — Other Ambulatory Visit: Payer: Self-pay | Admitting: Internal Medicine

## 2014-01-02 ENCOUNTER — Encounter: Payer: Self-pay | Admitting: Internal Medicine

## 2014-01-25 ENCOUNTER — Encounter: Payer: Self-pay | Admitting: Internal Medicine

## 2014-01-25 ENCOUNTER — Ambulatory Visit (INDEPENDENT_AMBULATORY_CARE_PROVIDER_SITE_OTHER): Payer: BC Managed Care – PPO | Admitting: Internal Medicine

## 2014-01-25 VITALS — BP 138/88 | HR 80 | Temp 98.1°F | Resp 16 | Ht 72.0 in | Wt 180.2 lb

## 2014-01-25 DIAGNOSIS — G479 Sleep disorder, unspecified: Secondary | ICD-10-CM

## 2014-01-25 DIAGNOSIS — F988 Other specified behavioral and emotional disorders with onset usually occurring in childhood and adolescence: Secondary | ICD-10-CM

## 2014-01-25 MED ORDER — ADDERALL 30 MG PO TABS
30.0000 mg | ORAL_TABLET | Freq: Three times a day (TID) | ORAL | Status: DC
Start: 2014-01-25 — End: 2014-04-12

## 2014-01-25 MED ORDER — ADDERALL 30 MG PO TABS: 30.0000 mg | ORAL_TABLET | Freq: Three times a day (TID) | ORAL | Status: DC

## 2014-01-25 MED ORDER — LORAZEPAM 2 MG PO TABS: ORAL_TABLET | ORAL | Status: DC

## 2014-01-25 NOTE — Progress Notes (Signed)
Subjective:    Patient ID: Phillip [name], male    DOB: 09/04/1955, 58 y.o.   MRN: [id number]  This chart was scribed for Leandrew Koyanagi, MD by Edison Simon, ED Scribe. This patient was seen in room 24 and the patient's care was started at 3:27 PM.   HPI  HPI Comments: Phillip [name] is a 58 y.o. male who presents to the [facility] for follow up. He states he is compliant with Ativan and reports sleeping well, besides from some recent stress with his attorney who has not been able to fulfill his duties due to memory loss. He reports some recent family issues and mentions an impending divorce; he states he has a good relationship with his children. He reports using an herbal sleep remedy with some positive results. He reports taking magnesium as well and states it helps with anxiety and seems to help with sleep.   Patient Active Problem List   Diagnosis Date Noted  . Cognitive impairment 01/31/2013  . 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 ADD (attention deficit disorder) 03/10/2012  . Depression with anxiety 03/10/2012     Medication List       This list is accurate as of: 01/25/14 11:12 AM.  Always use your most recent med list.               ADDERALL 30 MG tablet  Generic drug:  amphetamine-dextroamphetamine  Take 1 tablet (30 mg total) by mouth 3 (three) times daily. For 30 d after signed     ADDERALL 30 MG tablet  Generic drug:  amphetamine-dextroamphetamine  Take 1 tablet (30 mg total) by mouth 3 (three) times daily.     amphetamine-dextroamphetamine 30 MG tablet  Commonly known as:  ADDERALL  Take 1 tablet (30 mg total) by mouth 3 (three) times daily. For 60 days after signed     doxycycline 100 MG capsule  Commonly known as:  VIBRAMYCIN  Take 1 capsule (100 mg total) by mouth 2 (two) times daily.     FISH OIL PO  Take 1 capsule by mouth daily.     gabapentin 300 MG capsule  Commonly known as:  NEURONTIN  Take 1-2 capsules (300-600 mg total) by  mouth at bedtime.     LORazepam 2 MG tablet  Commonly known as:  ATIVAN  TAKE ONE TABLET AT BEDTIME.     OVER THE COUNTER MEDICATION  OTC Vitamin D3 2000 iu taking 1 daily     OVER THE COUNTER MEDICATION  Neuro 2 taking 2 a day     OVER THE COUNTER MEDICATION  Neuroflam 2 a day     OVER THE COUNTER MEDICATION  Neurovite Plus taking 2 a day     OVER THE COUNTER MEDICATION  Brain/Memory taking 2 a day     VIAGRA 100 MG tablet  Generic drug:  sildenafil  TAKE 1/2 TO 1 TABLET DAILY AS NEEDED FOR ERECTILE DYSFUNCTION.         Review of Systems  Constitutional: Negative for fever.  Psychiatric/Behavioral: Positive for sleep disturbance.       Objective:   Physical Exam  Nursing note and vitals reviewed. Constitutional: He is oriented to person, place, and time. He appears well-developed and well-nourished.  HENT:  Head: Normocephalic and atraumatic.  Eyes: Conjunctivae are normal.  Neck: Normal range of motion. Neck supple.  Pulmonary/Chest: Effort normal.  Musculoskeletal: Normal range of motion.  Neurological: He is alert and oriented to person,  place, and time.  Skin: Skin is warm and dry.  Psychiatric: He has a normal mood and affect.          Assessment & Plan:   I have completed the patient encounter in its entirety as documented by the scribe, with editing by me where necessary. Nygel Prokop P. [name], M.D. ADD (attention deficit disorder)  Sleep disturbance, unspecified  Meds ordered this encounter  Medications  . ADDERALL 30 MG tablet    Sig: Take 1 tablet by mouth 3 (three) times daily. For 30 d after signed    Dispense:  90 tablet    Refill:  0  . ADDERALL 30 MG tablet    Sig: Take 1 tablet by mouth 3 (three) times daily.    Dispense:  90 tablet    Refill:  0  . ADDERALL 30 MG tablet    Sig: Take 1 tablet by mouth 3 (three) times daily. For 60 days after signed    Dispense:  90 tablet    Refill:  0  . LORazepam (ATIVAN) 2 MG tablet     Sig: TAKE ONE TABLET AT BEDTIME.    Dispense:  30 tablet    Refill:  5

## 2014-01-31 ENCOUNTER — Ambulatory Visit: Payer: BC Managed Care – PPO | Admitting: Neurology

## 2014-01-31 ENCOUNTER — Telehealth: Payer: Self-pay | Admitting: *Deleted

## 2014-01-31 NOTE — Telephone Encounter (Signed)
Patient is a no show for today's appt. (01/31/14    @3 :00)

## 2014-04-12 ENCOUNTER — Encounter: Payer: Self-pay | Admitting: Internal Medicine

## 2014-04-12 ENCOUNTER — Ambulatory Visit (INDEPENDENT_AMBULATORY_CARE_PROVIDER_SITE_OTHER): Payer: BC Managed Care – PPO | Admitting: Internal Medicine

## 2014-04-12 VITALS — BP 137/86 | HR 75 | Temp 98.2°F | Resp 16 | Ht 72.0 in | Wt 180.0 lb

## 2014-04-12 DIAGNOSIS — E785 Hyperlipidemia, unspecified: Secondary | ICD-10-CM

## 2014-04-12 DIAGNOSIS — G0490 Encephalitis and encephalomyelitis, unspecified: Secondary | ICD-10-CM

## 2014-04-12 DIAGNOSIS — F909 Attention-deficit hyperactivity disorder, unspecified type: Secondary | ICD-10-CM

## 2014-04-12 DIAGNOSIS — Z125 Encounter for screening for malignant neoplasm of prostate: Secondary | ICD-10-CM

## 2014-04-12 DIAGNOSIS — K6289 Other specified diseases of anus and rectum: Secondary | ICD-10-CM

## 2014-04-12 DIAGNOSIS — F418 Other specified anxiety disorders: Secondary | ICD-10-CM

## 2014-04-12 DIAGNOSIS — F988 Other specified behavioral and emotional disorders with onset usually occurring in childhood and adolescence: Secondary | ICD-10-CM

## 2014-04-12 DIAGNOSIS — R4189 Other symptoms and signs involving cognitive functions and awareness: Secondary | ICD-10-CM

## 2014-04-12 DIAGNOSIS — R5383 Other fatigue: Secondary | ICD-10-CM

## 2014-04-12 DIAGNOSIS — Z0000 Encounter for general adult medical examination without abnormal findings: Secondary | ICD-10-CM

## 2014-04-12 LAB — COMPLETE METABOLIC PANEL WITH GFR
ALT: 18 U/L (ref 0–53)
AST: 21 U/L (ref 0–37)
Albumin: 4.4 g/dL (ref 3.5–5.2)
Alkaline Phosphatase: 106 U/L (ref 39–117)
BUN: 15 mg/dL (ref 6–23)
CO2: 28 mEq/L (ref 19–32)
Calcium: 9.5 mg/dL (ref 8.4–10.5)
Chloride: 103 mEq/L (ref 96–112)
Creat: 0.84 mg/dL (ref 0.50–1.35)
GFR, Est African American: >89 mL/min
GFR, Est Non African American: >89 mL/min
Glucose, Bld: 96 mg/dL (ref 70–99)
Potassium: 5.1 mEq/L (ref 3.5–5.3)
Sodium: 140 mEq/L (ref 135–145)
Total Bilirubin: 0.8 mg/dL (ref 0.2–1.2)
Total Protein: 7.1 g/dL (ref 6.0–8.3)

## 2014-04-12 LAB — CBC WITH DIFFERENTIAL/PLATELET
BASOS ABS: 0.1 10*3/uL (ref 0.0–0.1)
BASOS PCT: 1 % (ref 0–1)
EOS PCT: 3 % (ref 0–5)
Eosinophils Absolute: 0.2 10*3/uL (ref 0.0–0.7)
HCT: 45.3 % (ref 39.0–52.0)
Hemoglobin: 14.5 g/dL (ref 13.0–17.0)
Lymphocytes Relative: 29 % (ref 12–46)
Lymphs Abs: 1.6 10*3/uL (ref 0.7–4.0)
MCH: 30.9 pg (ref 26.0–34.0)
MCHC: 32 g/dL (ref 30.0–36.0)
MCV: 96.4 fL (ref 78.0–100.0)
MPV: 9.6 fL (ref 9.4–12.4)
Monocytes Absolute: 0.7 10*3/uL (ref 0.1–1.0)
Monocytes Relative: 12 % (ref 3–12)
Neutro Abs: 3.1 10*3/uL (ref 1.7–7.7)
Neutrophils Relative %: 55 % (ref 43–77)
PLATELETS: 306 10*3/uL (ref 150–400)
RBC: 4.7 MIL/uL (ref 4.22–5.81)
RDW: 12.9 % (ref 11.5–15.5)
WBC: 5.6 10*3/uL (ref 4.0–10.5)

## 2014-04-12 LAB — LIPID PANEL
Cholesterol: 213 mg/dL — ABNORMAL HIGH (ref 0–200)
HDL: 55 mg/dL (ref >39)
LDL CALC: 129 mg/dL — AB (ref 0–99)
Total CHOL/HDL Ratio: 3.9 Ratio
Triglycerides: 145 mg/dL (ref <150)
VLDL: 29 mg/dL (ref 0–40)

## 2014-04-12 LAB — TSH: TSH: 2.312 u[IU]/mL (ref 0.350–4.500)

## 2014-04-12 MED ORDER — ADDERALL 30 MG PO TABS: 30.0000 mg | ORAL_TABLET | Freq: Three times a day (TID) | ORAL | Status: DC

## 2014-04-12 MED ORDER — LORAZEPAM 2 MG PO TABS: ORAL_TABLET | ORAL | Status: DC

## 2014-04-12 MED ORDER — ADDERALL 30 MG PO TABS
30.0000 mg | ORAL_TABLET | Freq: Three times a day (TID) | ORAL | Status: DC
Start: 2014-04-12 — End: 2014-05-19

## 2014-04-12 NOTE — Progress Notes (Signed)
Subjective:    Patient ID: Phillip [name], male    DOB: May 13, 1955, 58 y.o.   MRN: [id number]  HPI here for annual physical Patient Active Problem List   Diagnosis Date Noted  . Cognitive impairment 01/31/2013  . 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 ADD (attention deficit disorder) 03/10/2012  . Depression with anxiety 03/10/2012   Continues to be very stressed by current legal situation with wife filing for divorce and significant alimony. Finally has a new legal team that supports him. No new medical issues. Continues to have problems with sleep disruption with probable restless legs. This occurs despite Ativan 2 mg. Iron studies were normal recently. He feels like he's had more minor illnesses and he should have and just finished four-day bout of diarrhea but without fever or vomiting.  Immunization up-to-date  Review of Systems  Constitutional: Positive for fatigue. Negative for fever, diaphoresis, activity change, appetite change and unexpected weight change.       He still has periods of fatigue that he thinks are related to his poor sleep  HENT: Negative for dental problem, hearing loss, rhinorrhea and trouble swallowing.   Eyes: Negative for photophobia and visual disturbance.  Respiratory: Negative for cough, chest tightness and shortness of breath.   Cardiovascular: Negative for chest pain, palpitations and leg swelling.  Gastrointestinal: Positive for rectal pain. Negative for nausea, abdominal pain, constipation and blood in stool.       The rectal pain is in the midline in the perineum with some sort of swelling and tenderness which has been present for about 2 months--no discharge No tenesmus No hemorrhoids He had a skin tag at the ventral aspect of the anus several months ago that seemed to resolve before was identified by anyone.  Genitourinary: Negative for urgency, frequency and difficulty urinating.  Musculoskeletal: Negative for myalgias, back pain, arthralgias,  gait problem and neck pain.  Skin: Negative for rash.  Neurological: Negative for weakness, numbness and headaches.  Hematological: Does not bruise/bleed easily.  Psychiatric/Behavioral: Positive for confusion, sleep disturbance, dysphoric mood and decreased concentration.       All of these problems are stable with a long history dating to the post encephalitis. Multiple interventions are documented in the electronic and paper records that have failed to prove useful. 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       Objective:   Physical Exam  Constitutional: He is oriented to person, place, and time. He appears well-developed and well-nourished.  HENT:  Head: Normocephalic.  Right Ear: External ear normal.  Left Ear: External ear normal.  Nose: Nose normal.  Mouth/Throat: Oropharynx is clear and moist.  Tms and canals clear  Eyes: Conjunctivae and EOM are normal. Pupils are equal, round, and reactive to light.  Neck: Normal range of motion. Neck supple. No thyromegaly present.  Cardiovascular: Normal rate, regular rhythm, normal heart sounds and intact distal pulses.   No murmur heard. Pulmonary/Chest: Effort normal and breath sounds normal. No respiratory distress. He has no wheezes. He has no rales.  Abdominal: Soft. Bowel sounds are normal. He exhibits no distension and no mass. There is no tenderness. There is no rebound and no guarding.  No hepatosplenomegaly  Genitourinary:  The rectum has no masses or tenderness in the prostate is symmetrical and soft without nodules. Just anterior to the anus is a firm somewhat engorged skin tag and a subcutaneous nodule approximately 1.5 cm diameter that is movable  but very tender. There is no redness and no pus present.  Musculoskeletal: Normal range of motion. He exhibits no edema or tenderness.  Lymphadenopathy:    He has no cervical adenopathy.  Neurological: He is alert and oriented to person, place,  and time. He has normal reflexes. No cranial nerve deficit. He exhibits normal muscle tone. Coordination normal.  Skin: Skin is warm and dry. No rash noted.  Psychiatric: He has a normal mood and affect. His behavior is normal. Judgment and thought content normal.   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

## 2014-04-13 LAB — SEDIMENTATION RATE: Sed Rate: 5 mm/hr (ref 0–16)

## 2014-04-13 LAB — PSA: PSA: 0.57 ng/mL (ref <=4.00)

## 2014-04-18 ENCOUNTER — Encounter: Payer: Self-pay | Admitting: Internal Medicine

## 2014-05-03 ENCOUNTER — Telehealth: Payer: Self-pay

## 2014-05-03 NOTE — Telephone Encounter (Signed)
PA needed for Adderall 30 mg TID. He has ADHD and post encephalitis Brain Syndrome and has tried/failed Dexastat, vyvanse, wellbutrin and Adderall 20 TID and Adderall 30 BID. Completed PA on covermymeds. Pending.

## 2014-05-05 NOTE — Telephone Encounter (Signed)
PA approved through 05/02/15. Notified pharm.

## 2014-05-08 ENCOUNTER — Telehealth: Payer: Self-pay

## 2014-05-08 NOTE — Telephone Encounter (Signed)
Rogers Blocker called wanting to get your help again with prior authorizations for the patient's medications.  [phone]

## 2014-05-09 NOTE — Telephone Encounter (Signed)
Called [name] back ( on HIPPA) and LMOM that I completed PA on Adderall end of year, and was approved, but if it needs to be done again, or PA needed on another med to [facility] and let me know. I have not seen any other PA info from pharm yet.

## 2014-05-10 NOTE — Telephone Encounter (Signed)
Paula Compton and stated that a new PA is needed and pharm has faxed ins info. Completed covermymeds. Pending.

## 2014-05-10 NOTE — Telephone Encounter (Signed)
Phillip [name] and reported that pt's plan has changed and they got a letter stating that the generic would be cheaper (pt needs name brand of Adderall), but it doesn't say anything about a PA being needed. Pt will take Rx to pharm today and have them send me info if another PA is needed.

## 2014-05-12 NOTE — Telephone Encounter (Signed)
PA approved through 05/09/15. Notified [name] on [employer] and pharm.

## 2014-05-15 ENCOUNTER — Telehealth: Payer: Self-pay

## 2014-05-15 NOTE — Telephone Encounter (Signed)
Patient went to pick up his prescription "Aderral" it now will cost him $450. His insurance has moved it to another tier which raised the cost. Patient is requesting a generic brand or any other options he may have. Per [name] he was given a generic brand back in march of 2015 but was unable to tolerate it.-She doesn't know the name of it. Karen's call back number is [phone]

## 2014-05-15 NOTE — Telephone Encounter (Signed)
GoodRx[facility]com will let them search for cheapest price If adderall 30 tid not affordable, he should check the prices his insurance has for generic concerta 56, generic vyvanse 70 and generic ritalin 20mg  3 times a day (90 a month)

## 2014-05-16 NOTE — Telephone Encounter (Signed)
Left detailed message on machine explaining message.   cb if needed

## 2014-05-18 ENCOUNTER — Other Ambulatory Visit: Payer: Self-pay

## 2014-05-18 NOTE — Telephone Encounter (Signed)
Pt's insurance is denying the brand name "adderall", and they will only approve the generic form. The pt only has 21 pills left. Can we prescribe the generic adderall next time?

## 2014-05-19 NOTE — Telephone Encounter (Signed)
LMOM to CB. Phillip [name] had LM that they only got #21 tablets. Get details of what is needed for January and then Feb Rx will have to be re-written and exchanged.

## 2014-05-19 NOTE — Telephone Encounter (Signed)
Paula Compton and reported that Kazuki has 2 paper Rxs left and then needs the remainder of the Rx that he got the #21 from. [facility] and confirmed that the #21 were given from the first Rx written on 04/12/14. I have pended these for review with note to pharm to NOT [facility] which pt had SEs from in the past [name] reported that pt had some confusion and lethargy from generic made by one manufacturer that she thinks was [employer], but did fine with other generics).

## 2014-05-19 NOTE — Telephone Encounter (Signed)
Phillip [name] did agree that she would bring in the original Rxs to exchange along with the letter from pharm that they only got #21 from the first Rx.

## 2014-05-23 NOTE — Telephone Encounter (Signed)
Yes please, Dr [name]. I called [name] and let her know that you will be able to get the new Rxs written tomorrow and she stated that pt does have enough to last through tomorrow, so that will be fine to wait for Dr [name] to do then.

## 2014-05-23 NOTE — Telephone Encounter (Signed)
Do i need to address this tomorrow??

## 2014-05-24 MED ORDER — AMPHETAMINE-DEXTROAMPHETAMINE 30 MG PO TABS: 30.0000 mg | ORAL_TABLET | Freq: Three times a day (TID) | ORAL | Status: DC

## 2014-05-24 NOTE — Telephone Encounter (Signed)
Notified Phillip [name] Rxs are ready and she will come in to get them right away and return the two Rxs for the name brand in exchange for the new ones.

## 2014-05-24 NOTE — Telephone Encounter (Signed)
Phillip [name] came and p/up new Rxs for generic and did return the orig 2 outstanding ones for name brand, which I have shredded. Also brought a report from pharm showing that only #21 tabs were dispensed from 1st Rx.

## 2014-06-09 ENCOUNTER — Telehealth: Payer: Self-pay

## 2014-06-09 MED ORDER — ADDERALL 30 MG PO TABS: 30.0000 mg | ORAL_TABLET | Freq: Three times a day (TID) | ORAL | Status: DC

## 2014-06-09 NOTE — Telephone Encounter (Signed)
Spoke with [name] and advised Rx is ready to pick up.

## 2014-06-09 NOTE — Telephone Encounter (Signed)
[name] called and reported that the generic adderall is just not working for pt and he will need to go back on the brand name. He is unable to function, confused (more than normal), and has feeling of not caring/no energy. He also described once a "feeling that it wasn't him in his body". If Dr [name] can write two new Rxs for brand name only to replace the 30 day and 60 day fill date scripts with 05/24/14 written date, she will return the two outstanding RFs that he has. Pended. I advised [name] to call his ins cust serv and ask if there is any way to do a "tier reduction" for the brand Adderall to lower his cost since he can not use the generic. Explained some plans have this and some don't. She stated she will check on Monday and call me.

## 2014-06-18 ENCOUNTER — Ambulatory Visit (INDEPENDENT_AMBULATORY_CARE_PROVIDER_SITE_OTHER): Payer: BLUE CROSS/BLUE SHIELD | Admitting: Family Medicine

## 2014-06-18 VITALS — BP 118/74 | HR 81 | Temp 98.0°F | Resp 16 | Ht 73.0 in | Wt 181.0 lb

## 2014-06-18 DIAGNOSIS — J209 Acute bronchitis, unspecified: Secondary | ICD-10-CM | POA: Diagnosis not present

## 2014-06-18 MED ORDER — HYDROCOD POLST-CHLORPHEN POLST 10-8 MG/5ML PO LQCR: 5.0000 mL | Freq: Two times a day (BID) | ORAL | Status: DC | PRN

## 2014-06-18 MED ORDER — AZITHROMYCIN 250 MG PO TABS: ORAL_TABLET | ORAL | Status: DC

## 2014-06-18 NOTE — Patient Instructions (Signed)

## 2014-06-18 NOTE — Progress Notes (Signed)
Subjective:  This chart was scribed for Dr. Delman Cheadle, MD by Erling Conte, ED Scribe. This patient was seen in Room 8 and the patient's care was started at 1:40 PM.   Patient ID: Phillip [name], male    DOB: 1955/10/01, 59 y.o.   MRN: [id number]  Chief Complaint  Patient presents with  . Sore Throat    x 2 days  . Cough  . Fatigue    HPI HPI Comments: Phillip [name] is a 59 y.o. male who presents to the [facility] complaining of constant, "burning", sore throat for 2 days. He is having associated non productive cough, nasal congestion, mild SOB, chills, and fatigue. He has been using throat lozenges with mild relief. He had a sick contact through his daughter who had bronchitis. He is a former smoker. He denies any fever.  Past Medical History  Diagnosis Date  . Depression   . Encephalitis   . Anxiety    Current Outpatient Prescriptions on File Prior to Visit  Medication Sig Dispense Refill  . ADDERALL 30 MG tablet Take 1 tablet by mouth 3 (three) times daily. MAY FILL ON/AFTER 06/23/14. 90 tablet 0  . ADDERALL 30 MG tablet Take 1 tablet by mouth 3 (three) times daily. MAY FILL ON/AFTER 07/23/14 90 tablet 0  . amphetamine-dextroamphetamine (ADDERALL) 30 MG tablet Take 1 tablet by mouth 3 (three) times daily. 69 tablet 0  . LORazepam (ATIVAN) 2 MG tablet TAKE ONE TABLET AT BEDTIME. 30 tablet 5  . Omega-3 Fatty Acids (FISH OIL PO) Take 1 capsule by mouth daily.    Marland Kitchen OVER THE COUNTER MEDICATION OTC Vitamin D3 2000 iu taking 1 daily    . OVER THE COUNTER MEDICATION Neuroflam 2 a day    . OVER THE COUNTER MEDICATION Neurovite Plus taking 2 a day    . OVER THE COUNTER MEDICATION Brain/Memory taking 2 a day    . gabapentin (NEURONTIN) 300 MG capsule Take 1-2 capsules (300-600 mg total) by mouth at bedtime. (Patient not taking: Reported on 06/18/2014) 30 capsule 3  . VIAGRA 100 MG tablet TAKE 1/2 TO 1 TABLET DAILY AS NEEDED FOR ERECTILE DYSFUNCTION. (Patient not taking:  Reported on 06/18/2014) 4 tablet 1   No current facility-administered medications on file prior to visit.   Allergies  Allergen Reactions  . Penicillins Hives    Review of Systems  Constitutional: Positive for chills and fatigue. Negative for fever.  HENT: Positive for congestion and sore throat.   Respiratory: Positive for cough and shortness of breath.        Objective:   Physical Exam  Constitutional: He is oriented to person, place, and time. He appears well-developed and well-nourished. No distress.  HENT:  Head: Normocephalic and atraumatic.  Right Ear: Tympanic membrane, external ear and ear canal normal.  Left Ear: Tympanic membrane, external ear and ear canal normal.  Mouth/Throat: Oropharynx is clear and moist. No oropharyngeal exudate.  Nasal mucosa erythema  Eyes: Conjunctivae and EOM are normal.  Neck: Neck supple. No tracheal deviation present. No thyromegaly present.  Cardiovascular: Normal rate, regular rhythm and normal heart sounds.   Pulmonary/Chest: Effort normal and breath sounds normal. No respiratory distress.  Musculoskeletal: Normal range of motion.  Lymphadenopathy:    He has no cervical adenopathy.  Neurological: He is alert and oriented to person, place, and time.  Skin: Skin is warm and dry.  Psychiatric: He has a normal mood and affect. His behavior is  normal.  Nursing note and vitals reviewed. BP 118/74 mmHg  Pulse 81  Temp(Src) 98 F (36.7 C)  Resp 16  Ht 6\' 1"  (1.854 m)  Wt 181 lb (82.101 kg)  BMI 23.89 kg/m2  SpO2 98%   Assessment & Plan:   Acute bronchitis, unspecified organism  Meds ordered this encounter  Medications  . DISCONTD: azithromycin (ZITHROMAX) 250 MG tablet    Sig: Take 2 tabs PO x 1 dose, then 1 tab PO QD x 4 days    Dispense:  6 tablet    Refill:  0  . chlorpheniramine-HYDROcodone (TUSSIONEX PENNKINETIC ER) 10-8 MG/5ML LQCR    Sig: Take 5 mLs by mouth every 12 (twelve) hours as needed.    Dispense:  120 mL     Refill:  0    I personally performed the services described in this documentation, which was scribed in my presence. The recorded information has been reviewed and considered, and addended by me as needed.  Delman Cheadle, MD MPH

## 2014-06-22 ENCOUNTER — Ambulatory Visit (INDEPENDENT_AMBULATORY_CARE_PROVIDER_SITE_OTHER): Payer: BLUE CROSS/BLUE SHIELD | Admitting: Physician Assistant

## 2014-06-22 ENCOUNTER — Ambulatory Visit (INDEPENDENT_AMBULATORY_CARE_PROVIDER_SITE_OTHER): Payer: BLUE CROSS/BLUE SHIELD

## 2014-06-22 VITALS — BP 129/84 | HR 71 | Temp 97.7°F | Resp 20 | Ht 72.0 in | Wt 179.0 lb

## 2014-06-22 DIAGNOSIS — J209 Acute bronchitis, unspecified: Secondary | ICD-10-CM

## 2014-06-22 MED ORDER — BECLOMETHASONE DIPROPIONATE 40 MCG/ACT IN AERS: 1.0000 | INHALATION_SPRAY | Freq: Two times a day (BID) | RESPIRATORY_TRACT | Status: DC

## 2014-06-22 NOTE — Patient Instructions (Signed)
Use inhaler twice a day. Continue to use the cough syrup at night.

## 2014-06-22 NOTE — Progress Notes (Signed)
Subjective:    Patient ID: Phillip [name], male    DOB: 1955/07/12, 59 y.o.   MRN: [id number]  HPI  This is a 59 year old male presenting with 6 days of illness. He initially had cough, nasal congestion and sore throat. He states nasal congestion and sore throat have resolved but his cough "feels deeper". He was seen here after 2 days of illness and prescribed tussionex and Zpak. He finished the zpak today. States his cough has not improved. Cough became productive of yellow/green sputum 1.5 days ago. Cough is worse at night but cough syrup is helping. He has had pneumonia in the past and states the cough feels like pneumonia to him. He has noticed some wheezing with deep inspiration. He denies sinus pressure, otalgia, fever, chills or SOB. He does not have a history of asthma. He is a former smoker, quit 11 years ago. No sick contacts.  Review of Systems  Constitutional: Positive for fatigue. Negative for fever and chills.  HENT: Negative for congestion, ear pain, sinus pressure and sore throat.   Eyes: Negative for redness.  Respiratory: Positive for cough and wheezing. Negative for shortness of breath.   Gastrointestinal: Negative for nausea and vomiting.  Skin: Negative for rash.  Neurological: Negative for headaches.  Hematological: Negative for adenopathy.  Psychiatric/Behavioral: Positive for sleep disturbance.    Patient Active Problem List   Diagnosis Date Noted  . Cognitive impairment 01/31/2013  . 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 ADD (attention deficit disorder) 03/10/2012  . Depression with anxiety 03/10/2012   Prior to Admission medications   Medication Sig Start Date End Date Taking? Authorizing Provider  ADDERALL 30 MG tablet Take 1 tablet by mouth 3 (three) times daily. MAY FILL ON/AFTER 06/23/14. 06/09/14  Yes Leandrew Koyanagi, MD  ADDERALL 30 MG tablet Take 1 tablet by mouth 3 (three) times daily. MAY FILL ON/AFTER 07/23/14 06/09/14  Yes Leandrew Koyanagi, MD    amphetamine-dextroamphetamine (ADDERALL) 30 MG tablet Take 1 tablet by mouth 3 (three) times daily. 05/24/14  Yes Leandrew Koyanagi, MD  chlorpheniramine-HYDROcodone [facility]) 10-8 MG/5ML LQCR Take 5 mLs by mouth every 12 (twelve) hours as needed. 06/18/14  Yes Shawnee Knapp, MD  gabapentin (NEURONTIN) 300 MG capsule Take 1-2 capsules (300-600 mg total) by mouth at bedtime. 10/19/13  Yes Leandrew Koyanagi, MD  LORazepam (ATIVAN) 2 MG tablet TAKE ONE TABLET AT BEDTIME. 04/12/14  Yes Leandrew Koyanagi, MD  Omega-3 Fatty Acids (FISH OIL PO) Take 1 capsule by mouth daily.   Yes Historical Provider, MD  OVER THE COUNTER MEDICATION OTC Vitamin D3 2000 iu taking 1 daily   Yes Historical Provider, MD  OVER THE COUNTER MEDICATION Neuroflam 2 a day   Yes Historical Provider, MD  OVER THE COUNTER MEDICATION Neurovite Plus taking 2 a day   Yes Historical Provider, MD  OVER THE COUNTER MEDICATION Brain/Memory taking 2 a day   Yes Historical Provider, MD  VIAGRA 100 MG tablet TAKE 1/2 TO 1 TABLET DAILY AS NEEDED FOR ERECTILE DYSFUNCTION. 11/17/13  Yes Collene Leyden, PA-C   Allergies  Allergen Reactions  . Penicillins Hives   Patient's social and family history were reviewed.     Objective:   Physical Exam  Constitutional: He is oriented to person, place, and time. He appears well-developed and well-nourished. No distress.  HENT:  Head: Normocephalic and atraumatic.  Right Ear: Hearing, tympanic membrane, external ear and ear canal normal.  Left Ear: Hearing,  tympanic membrane, external ear and ear canal normal.  Nose: Nose normal. Right sinus exhibits no maxillary sinus tenderness and no frontal sinus tenderness. Left sinus exhibits no maxillary sinus tenderness and no frontal sinus tenderness.  Mouth/Throat: Uvula is midline, oropharynx is clear and moist and mucous membranes are normal.  Eyes: Conjunctivae and lids are normal. Right eye exhibits no discharge. Left eye exhibits no  discharge. No scleral icterus.  Cardiovascular: Normal rate, regular rhythm, normal heart sounds, intact distal pulses and normal pulses.   No murmur heard. Pulmonary/Chest: Effort normal. No respiratory distress. He has no wheezes. He has rhonchi (throughout). He has no rales.  Musculoskeletal: Normal range of motion.  Lymphadenopathy:    He has no cervical adenopathy.  Neurological: He is alert and oriented to person, place, and time.  Skin: Skin is warm, dry and intact. No lesion and no rash noted.  Psychiatric: He has a normal mood and affect. His speech is normal and behavior is normal. Thought content normal.   BP 129/84 mmHg  Pulse 71  Temp(Src) 97.7 F (36.5 C) (Oral)  Resp 20  Ht 6' (1.829 m)  Wt 179 lb (81.194 kg)  BMI 24.27 kg/m2  SpO2 99%  [facility] reading (PRIMARY) by  Dr. Tamala Julian: negative      Assessment & Plan:  1. Acute bronchitis, unspecified organism Chest radiograph normal. Etiology is likely viral since abx did not help his symptoms. He will continue tussionex QHS. He will start using qvar BID to decrease inflammation. He will return in 7-10 days if symptoms are not improving.   - DG Chest 2 View; Future - beclomethasone (QVAR) 40 MCG/ACT inhaler; Inhale 1 puff into the lungs 2 (two) times daily.  Dispense: 1 Inhaler; Refill: 0   Benjaman Pott. Drenda Freeze, MHS [facility]  06/25/2014

## 2014-06-24 ENCOUNTER — Telehealth: Payer: Self-pay

## 2014-06-24 NOTE — Telephone Encounter (Signed)
PA started for Qvar

## 2014-07-04 DIAGNOSIS — Z860101 Personal history of adenomatous and serrated colon polyps: Secondary | ICD-10-CM

## 2014-07-04 DIAGNOSIS — Z86010 Personal history of colonic polyps: Secondary | ICD-10-CM

## 2014-07-04 HISTORY — DX: Personal history of colonic polyps: Z86.010

## 2014-07-04 HISTORY — DX: Personal history of adenomatous and serrated colon polyps: Z86.0101

## 2014-07-04 HISTORY — PX: COLONOSCOPY: SHX174

## 2014-08-03 ENCOUNTER — Telehealth: Payer: Self-pay

## 2014-08-03 NOTE — Telephone Encounter (Signed)
Pt is needing to talk with dr [name]

## 2014-08-03 NOTE — Telephone Encounter (Signed)
Tried to cal pt, mailbox full.

## 2014-08-04 ENCOUNTER — Telehealth: Payer: Self-pay

## 2014-08-04 NOTE — Telephone Encounter (Signed)
PATIENT'S ASSISTANT (KAREN RAMEY) CALLED BACK TO REACH TAMARA. SHE SAID WHEN TAMARA CALLS MR. Killmer BACK THAT SHE PROBABLY WON'T BE ABLE TO REACH HIM BECAUSE HIS VOICE MAIL IS STILL FULL. SHE SAID JUST CALL  HER AT [phone]. [place]

## 2014-08-04 NOTE — Telephone Encounter (Signed)
PATIENT IS RETURNING Phillip [name]'S CALL FROM TODAY. BEST PHONE [phone] (HOME) [place]

## 2014-08-04 NOTE — Telephone Encounter (Signed)
Tried to call pt, Mailbox full.

## 2014-08-07 NOTE — Telephone Encounter (Signed)
Phillip [name] called back and clarified that pt would like Dr [name] to Rx Nitric Oxide to increase blood flow to the brain. Dr. Darlis Loan [phone] in [facility] can speak to Dr [name] to give further info in needed. Phillip [name] advised that this doctor did lab work, MRI, and other tests for pt and recommends that he be started on Nitric Oxide (these tests and consult was all done over the phone).

## 2014-08-08 NOTE — Telephone Encounter (Signed)
If the MD wants to do this they will have to order it in [place] I cannot order that med from here or have someone do it here

## 2014-08-11 NOTE — Telephone Encounter (Signed)
Spoke with [name] advised note from Dr. [name]. [name] understood.

## 2014-08-11 NOTE — Telephone Encounter (Signed)
These Rxs were never p/up. Called [name] to see if pt still needs these and she reported that he finally "balanced back out" after starting on the generic so he will continue that as long as it is working since the cost is so much less. Shredded the Rx for name brand.

## 2014-08-14 ENCOUNTER — Telehealth: Payer: Self-pay | Admitting: Internal Medicine

## 2014-08-15 ENCOUNTER — Telehealth: Payer: Self-pay

## 2014-08-15 NOTE — Telephone Encounter (Signed)
Meds ordered this encounter  Medications  . LORazepam (ATIVAN) 2 MG tablet    Sig: TAKE 1 TABLET BY MOUTH AT BEDTIME    Dispense:  30 tablet    Refill:  5    This request is for a new prescription for a controlled substance as required by Federal/State law.Marland Kitchen

## 2014-08-15 NOTE — Telephone Encounter (Signed)
Pt's wife called wanting a refill on her husband's script for LORazepam (ATIVAN) 2 MG tablet [[id number]]. He is about to run out. Please advise at [phone]

## 2014-08-16 ENCOUNTER — Telehealth: Payer: Self-pay

## 2014-08-16 NOTE — Telephone Encounter (Signed)
Spoke with wife. Pt wants to go ahead and start tapering off of it. Eventually, he wants to look at getting on another medication. Please advise on how to start doing this.

## 2014-08-16 NOTE — Telephone Encounter (Signed)
Patient's wife [name] is calling on behalf of patient and would like to speak with someone about taking patient off of lorazepam. [name] states that it's no longer working for the patient and he's having trouble sleeping at night. She also asked if we can call back as soon as possible because the patient needs another medication right away. Karen's phone: [phone]

## 2014-08-16 NOTE — Telephone Encounter (Signed)
Faxed Rx and notified pt on Vm.

## 2014-08-16 NOTE — Telephone Encounter (Signed)
Left message for pt to call back  °

## 2014-08-16 NOTE — Telephone Encounter (Signed)
Is this just for sleep--if not and he needs for anxiety-we could call in klonopin or alprazalam If just sleep I would try something else

## 2014-08-18 NOTE — Telephone Encounter (Signed)
Spoke with pt, he thinks it is his anxiety causing him not to sleep. He wants to wait for his wife to speak to me. She should call the first of the week.

## 2014-08-24 ENCOUNTER — Other Ambulatory Visit: Payer: Self-pay

## 2014-08-24 NOTE — Telephone Encounter (Signed)
Adderall Refill request. Call Sherrilee Gilles, [phone]

## 2014-08-28 ENCOUNTER — Telehealth: Payer: Self-pay | Admitting: Radiology

## 2014-08-28 MED ORDER — ADDERALL 30 MG PO TABS: 30.0000 mg | ORAL_TABLET | Freq: Three times a day (TID) | ORAL | Status: DC

## 2014-08-28 NOTE — Telephone Encounter (Signed)
Pt states he takes 3 pills per day and he only has one left.

## 2014-08-28 NOTE — Telephone Encounter (Signed)
Please call [employer] [employer] when the generic adderall is ready. [phone]

## 2014-08-28 NOTE — Telephone Encounter (Signed)
[name] called to check status. It looks like Dr [name] reviewed req on Thurs when at [facility], and wrote "pended till tomorrow" as if he was going to print them when he got here [place]. But don't see that this was done. Can someone else print these for the pt?

## 2014-08-28 NOTE — Telephone Encounter (Signed)
Notified [name] on VM that Rx is ready.

## 2014-08-28 NOTE — Telephone Encounter (Signed)
Pt was written for adderall brand name only. He wants generic. Please write this. Thanks!

## 2014-08-29 MED ORDER — ADDERALL 30 MG PO TABS: 30.0000 mg | ORAL_TABLET | Freq: Three times a day (TID) | ORAL | Status: DC

## 2014-08-29 MED ORDER — AMPHETAMINE-DEXTROAMPHETAMINE 30 MG PO TABS: 30.0000 mg | ORAL_TABLET | Freq: Three times a day (TID) | ORAL | Status: DC

## 2014-08-29 NOTE — Telephone Encounter (Signed)
[name] calling in to check on rx. Please call when ready. Thank you

## 2014-08-29 NOTE — Telephone Encounter (Signed)
Phillip [name] received Rx and shredded this. Can we rewrite Rx for generic please?

## 2014-08-29 NOTE — Telephone Encounter (Signed)
Meds ordered this encounter  Medications  . amphetamine-dextroamphetamine (ADDERALL) 30 MG tablet    Sig: Take 1 tablet by mouth 3 (three) times daily. For 60d after signed    Dispense:  90 tablet    Refill:  0    Fill w/generic, NOT MANUFACTURED BY [employer]  . ADDERALL 30 MG tablet    Sig: Take 1 tablet by mouth 3 (three) times daily. For 30d after signed    Dispense:  90 tablet    Refill:  0  . ADDERALL 30 MG tablet    Sig: Take 1 tablet by mouth 3 (three) times daily.    Dispense:  90 tablet    Refill:  0

## 2014-08-29 NOTE — Telephone Encounter (Signed)
Called [name], left message that Adderall ready to pick up.

## 2014-11-15 ENCOUNTER — Encounter: Payer: Self-pay | Admitting: Internal Medicine

## 2014-11-15 ENCOUNTER — Ambulatory Visit (INDEPENDENT_AMBULATORY_CARE_PROVIDER_SITE_OTHER): Payer: BLUE CROSS/BLUE SHIELD | Admitting: Internal Medicine

## 2014-11-15 VITALS — BP 124/75 | HR 67 | Temp 98.5°F | Resp 16 | Ht 72.0 in | Wt 174.8 lb

## 2014-11-15 DIAGNOSIS — G0490 Encephalitis and encephalomyelitis, unspecified: Secondary | ICD-10-CM | POA: Diagnosis not present

## 2014-11-15 DIAGNOSIS — F909 Attention-deficit hyperactivity disorder, unspecified type: Secondary | ICD-10-CM | POA: Diagnosis not present

## 2014-11-15 DIAGNOSIS — F988 Other specified behavioral and emotional disorders with onset usually occurring in childhood and adolescence: Secondary | ICD-10-CM

## 2014-11-15 DIAGNOSIS — F418 Other specified anxiety disorders: Secondary | ICD-10-CM

## 2014-11-15 DIAGNOSIS — R4189 Other symptoms and signs involving cognitive functions and awareness: Secondary | ICD-10-CM | POA: Diagnosis not present

## 2014-11-15 MED ORDER — ADDERALL 30 MG PO TABS: 30.0000 mg | ORAL_TABLET | Freq: Three times a day (TID) | ORAL | Status: DC

## 2014-11-15 MED ORDER — AMPHETAMINE-DEXTROAMPHETAMINE 30 MG PO TABS: 30.0000 mg | ORAL_TABLET | Freq: Three times a day (TID) | ORAL | Status: DC

## 2014-11-15 MED ORDER — LORAZEPAM 2 MG PO TABS: 2.0000 mg | ORAL_TABLET | Freq: Every day | ORAL | Status: DC

## 2014-11-15 NOTE — Progress Notes (Signed)
Follow-up Patient Active Problem List   Diagnosis Date Noted  . Cognitive impairment 01/31/2013  . 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 ADD (attention deficit disorder) 03/10/2012  . Depression with anxiety 03/10/2012    Current outpatient prescriptions:  .  ADDERALL 30 MG tablet, Take 1 tablet by mouth 3 (three) times daily. Marland Kitchen  LORazepam (ATIVAN) 2 MG tablet, Take 1 tablet (2 mg total) by mouth at bedtime .  Omega-3 Fatty Acids (FISH OIL PO), Take 1 capsule by mouth daily., Disp: , Rfl:  .  OVER THE COUNTER MEDICATION, OTC Vitamin D3 2000 iu taking 1 daily, Disp: , Rfl:  .  OVER THE COUNTER MEDICATION, Neuroflam 2 a day, Disp: , Rfl:  .  OVER THE COUNTER MEDICATION, Neurovite Plus taking 2 a day, Disp: , Rfl:  .  OVER THE COUNTER MEDICATION, Brain/Memory taking 2 a day, Disp: , Rfl:  .  VIAGRA 100 MG tablet, TAKE 1/2 TO 1 TABLET DAILY AS NEEDED FOR ERECTILE DYSFUNCTION. . Other supplements per homeopathy  He has some labs that he would like to pursue done by [place] lab that specializes in immune dysfunction This was suggested by his homeopathist  His sleep is much improved with our current regimen He continues to be excessively scattered in all daily activities despite Adderall His depression is somewhat better although he still very anxious at his lack of ability to focus and get things done He still requires help with daily life to stay organized He reports grade activities with his daughter and son in a good relationship with his mother His divorces completed this point I believe  He is very much interested in any other things that can improve his daily life  I/P Cognitive impairment /ADD (attention deficit disorder)//Encephalomeningitis  - Plan: Ambulatory referral to Occupational Therapy --Continue medication Depression with anxiety with sleep disruption--improved --- Continue when necessary Ativan particularly at bedtime  Meds ordered this encounter  Medications  .  ADDERALL 30 MG tablet    Sig: Take 1 tablet by mouth 3 (three) times daily. For 30d after signed    Dispense:  90 tablet    Refill:  0    Fill w/generic, NOT MANUFACTURED BY [employer]  . ADDERALL 30 MG tablet    Sig: Take 1 tablet by mouth 3 (three) times daily.    Dispense:  90 tablet    Refill:  0    Fill w/generic, NOT MANUFACTURED BY [employer]  . amphetamine-dextroamphetamine (ADDERALL) 30 MG tablet    Sig: Take 1 tablet by mouth 3 (three) times daily. For 60d after signed    Dispense:  90 tablet    Refill:  0    Fill w/generic, NOT MANUFACTURED BY [employer]  . LORazepam (ATIVAN) 2 MG tablet    Sig: Take 1 tablet (2 mg total) by mouth at bedtime.    Dispense:  30 tablet    Refill:  5    This request is for a new prescription for a controlled substance as required by Federal/State law..   I wrote an order for [facility] to draw his lab work

## 2014-12-05 ENCOUNTER — Encounter: Payer: Self-pay | Admitting: Occupational Therapy

## 2014-12-05 ENCOUNTER — Ambulatory Visit: Payer: BLUE CROSS/BLUE SHIELD | Attending: Internal Medicine | Admitting: Occupational Therapy

## 2014-12-05 DIAGNOSIS — R4189 Other symptoms and signs involving cognitive functions and awareness: Secondary | ICD-10-CM | POA: Diagnosis present

## 2014-12-05 NOTE — Therapy (Signed)
[facility] [address] [place], [place], [address] Phone: [phone]   Fax:  [phone]  Occupational Therapy Evaluation  Patient Details  Name: Phillip [name] MRN: [id number] Date of Birth: [birth year] Referring Provider:  Leandrew Koyanagi, MD  Encounter Date: 12/05/2014      OT End of Session - 12/05/14 2154    Visit Number 1   Number of Visits 8   Date for OT Re-Evaluation 01/02/15   Authorization Type BCBS   Authorization Time Period Pt approved for 30 OT visits per calendar year   OT Start Time 1150   OT Stop Time 1235   OT Time Calculation (min) 45 min   Activity Tolerance Patient tolerated treatment well      Past Medical History  Diagnosis Date  . Depression   . Encephalitis   . Anxiety     History reviewed. No pertinent past surgical history.  There were no vitals filed for this visit.  Visit Diagnosis:  Impaired cognition - Plan: Ot plan of care cert/re-cert      Subjective Assessment - 12/05/14 1158    Patient is accompained by: --  [name] PCA   Pertinent History see epic snapshot; pt with ADD, h/o of encephalomeningitis   Patient Stated Goals To try and improve  my cogntiive challenges   Currently in Pain? No/denies           [facility] OT Assessment - 12/05/14 0001    Assessment   Diagnosis encephalitis/meningitis   h/o ADD, two TBI's, depression, OCD, bipolar tendencies   Onset Date --  1994-   Assessment Per pt has numeorus interventions over the years with neuropsycholotigists, MD's, various clinics. Pt reports he has no real recall of ever having worked with ST or OT   Prior Therapy Per pt no OT or ST. Pt is poor historian due to long standing memory deficits. Pt and PCA brought paperwork however last medical report that is available is 2012.   Precautions   Precautions None   Restrictions   Weight Bearing Restrictions No   Balance Screen   Has the patient fallen in the past  6 months Yes   How many times? 2   Has the patient had a decrease in activity level because of a fear of falling?  No   Is the patient reluctant to leave their home because of a fear of falling?  No   Home  Environment   Family/patient expects to be discharged to: Private residence   Living Arrangements Children  110 and 57 yo chidren who live with pt intermittently   Available Help at Discharge Personal care attendant  PCA works about 20 hours a week with pt    Type of [place] One level   Prior Function   Level of Independence Independent   Vocation On disability   Vocation Requirements Pt was managing a farm however reports that he gets too frustrated and his emotions impair his ability to work effectively   ADL   Eating/Feeding Independent   Grooming Independent   Scientist, clinical (histocompatibility and immunogenetics) Independent   Lower Body Bathing Independent   Upper Body Dressing Supervision/safety  needs intermittent cues to find things   Lower Body Dressing Supervision/safety   Engineering geologist - Ecologist Independent   IADL   Shopping Needs to be accompanied on any shopping trip  due to organization/memory  Light Housekeeping Needs help with all home maintenance tasks  pt unable to organize activities   Community Mobility Drives own vehicle  relies on GPS for all trips   Medication Management Is not capable of dispensing or managing own medication  PCA organizes pill box and then has to cue pt to use box   Financial Management Dependent   Mobility   Mobility Status History of falls  minimum falls   Written Expression   Dominant Hand Right   Vision - History   Baseline Vision No visual deficits   Cognition   Overall Cognitive Status Impaired/Different from baseline   Area of Impairment Attention;Memory;Problem solving;Awareness   Current Attention Level Sustained;Alternating;Divided   Memory Decreased short-term  memory   Memory Comments --   Awareness Anticipatory   Problem Solving Slow processing;Difficulty sequencing;Requires verbal cues   Attention Sustained;Selective;Alternating;Divided   Sustained Attention Impaired   Sustained Attention Impairment Verbal complex;Functional basic;Functional complex   Selective Attention Impaired   Selective Attention Impairment Verbal complex;Functional basic;Functional complex   Alternating Attention Impaired   Alternating Attention Impairment Verbal basic;Verbal complex;Functional basic;Functional complex   Divided Attention Impaired   Divided Attention Impairment Verbal basic;Verbal complex;Functional basic;Functional complex   Memory Impaired   Memory Impairment Storage deficit;Retrieval deficit;Decreased recall of new information;Decreased long term memory;Decreased short term memory;Prospective memory   Decreased Long Term Memory Verbal complex;Functional basic;Functional complex   Decreased Short Term Memory Verbal basic;Verbal complex;Functional basic;Functional complex   Awareness Impaired   Awareness Impairment Anticipatory impairment   Problem Solving Impaired   Problem Solving Impairment Verbal complex;Functional basic;Functional complex   Executive Function Sequencing;Organizing;Decision Making;Self Monitoring;Self Correcting   Sequencing Impaired   Sequencing Impairment Verbal complex;Functional basic;Functional complex   Organizing Impaired   Organizing Impairment Verbal complex;Functional basic;Functional complex   Decision Making Impaired   Decision Making Impairment Verbal complex;Functional basic;Functional complex   Self Monitoring Impaired   Self Monitoring Impairment Verbal complex;Functional basic;Functional complex   Self Correcting Impaired   Self Correcting Impairment Verbal complex;Functional basic;Functional complex   Behaviors Impulsive;Poor frustration tolerance;Lability;Other (comment)  pt reports high anxiety/flight or  fight response when upset   ROM / Strength   AROM / PROM / Strength --  All WFL's                              OT Long Term Goals - 12/05/14 2126    OT LONG TERM GOAL #1   Title Pt and PCA will demonstrate understanding of  organization strategies to assist with basic ADL's tasks   Status New   OT LONG TERM GOAL #2   Title Pt and PCA will demonstrate understanding of benefit of structured schedule to assist with organization, sequencing and simple carry over for familiar tasks   Status New   OT LONG TERM GOAL #3   Title Pt and PCA will be provided with information regarding voc rehab services for possible referral   Status New   OT LONG TERM GOAL #4   Title Pt and PCA will demonstrate understanding of benefits of TBI support group as well as how to access group   Status New   OT LONG TERM GOAL #5   Title Pt and PCA will explore potential for additional psyhological supports to assist wtih depression, anxiety and other documented neuropsych issues.   Status New               Plan - 12/05/14 2134  Clinical Impression Statement Pt is a 59 year old male with complicated history of ADHD (diagnosed at age 35), episode of encephalitis and meningitis (1994), 2 TBI's (1997 and 2009) as well as major depression, bipolar tendencies, and social anxiety. Per pt and PCA report, pt has not been seen by any rehab therapies. Pt has been seen in numeous clinics by neuropsychologists.Pt repors he has undergone a great deal of testing over the years and has been educated as to what his deficits are however has not to date received any assistance in how to deal with this deficits. Cognitive deficits include: impaired attention, memory, organization, sequencing, problem solving, decision making.  These deficits impact the pt's ability to complete basic ADL and simple IADL activities. Due to the long standing nature of the deficits, true remediation of cognitive impairment is  highly unlikely however pt can benefit from  skilled OT services to alter his environment to improve basic day to day functioning as well as referrals to support services (see goals).  Finally, pt would also benefit from  home health OT services to assist the pt and his PCA in actual implementation of strategies within the home setting to improve independence in basic ADL as well as IADL's.    Pt will benefit from skilled therapeutic intervention in order to improve on the following deficits (Retired) Other (comment)  impaired cogntion   Rehab Potential Fair   OT Frequency 2x / week   OT Duration 4 weeks   OT Treatment/Interventions Self-care/ADL training;Therapeutic activities;Patient/family education;Cognitive remediation/compensation   Plan review goals, intiate beginning of home strategies for basic adl tasks   Recommended Other Services see goals. Pt has already been given contact and information for  TBI support group   Consulted and Agree with Plan of Care Patient;Family member/caregiver   Family Member Consulted [name] PCA        Problem List Patient Active Problem List   Diagnosis Date Noted  . Cognitive impairment 01/31/2013  . 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 ADD (attention deficit disorder) 03/10/2012  . Depression with anxiety 03/10/2012    Quay Burow, OTR/L 12/05/2014, 10:02 PM  [facility] [address] [place], [place], [address] Phone: [phone]   Fax:  [phone]

## 2014-12-06 ENCOUNTER — Telehealth: Payer: Self-pay

## 2014-12-06 DIAGNOSIS — F988 Other specified behavioral and emotional disorders with onset usually occurring in childhood and adolescence: Secondary | ICD-10-CM

## 2014-12-06 NOTE — Telephone Encounter (Signed)
Tried to call [name] back and phone went straight to voicemail.  I would like to speak with her regarding Mr. Samaras symptoms.  Advised they can back.  Philis Fendt, MS, PA-C   5:19 PM, 12/06/2014

## 2014-12-06 NOTE — Telephone Encounter (Signed)
[name] called and states patient is having side effects to the generic Adderall they were given. It was from a different manufacturer called Teva. He has lost a lot of weight, bad dreams, and he wakes up really sad. She wants to know if she can get another Rx while discarding the pills that he has already.  Benjie Karvonen is the brand he can tolerate. She states [facility] does not have this generic but [facility] can but she needs another Rx and wants it written for Monday. She will bring the pills here to discard or give them back to the pharmacist. Please advise.   Call [name] [phone]

## 2014-12-07 NOTE — Telephone Encounter (Signed)
[name] called back to speak with Philis Fendt. I did not know what other questions you had. Please call her at home [phone] call [phone]. Please call [name] she is his caretaker.

## 2014-12-11 ENCOUNTER — Ambulatory Visit: Payer: BLUE CROSS/BLUE SHIELD | Admitting: Occupational Therapy

## 2014-12-11 DIAGNOSIS — R4189 Other symptoms and signs involving cognitive functions and awareness: Secondary | ICD-10-CM | POA: Diagnosis not present

## 2014-12-11 MED ORDER — ADDERALL 30 MG PO TABS: 30.0000 mg | ORAL_TABLET | Freq: Three times a day (TID) | ORAL | Status: DC

## 2014-12-11 NOTE — Telephone Encounter (Signed)
[name] called and asked for [place]. I transferred her to voicemail.

## 2014-12-11 NOTE — Patient Instructions (Signed)
Referral and web site info given for neuropsych (Dr. Marlane Hatcher) and voc rehab.

## 2014-12-11 NOTE — Telephone Encounter (Signed)
Thanks Elmyra Ricks.  I've called the [facility] on [address] and cancelled all future prescriptions.  I was going to write him for 30 mg Dextroamp-amphetamin TID manufacturer: Aurobindo.  Quant: 90.  He has already agreed to come in within the next 30 days for eval of symptoms.  [facility] will not [facility] back the remaining 65 pills so we will need to trade the new prescription for the remaining.  Please call me at [phone] with any questions, as I know this is confusing.  Philis Fendt, MS, PA-C   4:44 PM, 12/11/2014

## 2014-12-11 NOTE — Telephone Encounter (Signed)
Can someone call her today please about this. I do not know what to tell her.

## 2014-12-11 NOTE — Telephone Encounter (Signed)
Notified [name] that Rx is ready and she will bring in the remaining pills from the Rx pt couldn't tolerate.

## 2014-12-11 NOTE — Telephone Encounter (Signed)
1 month refilled, rx printed and ready for pick up. He will need to return prescription for upcoming month in return for new prescription. Return in 1 month or sooner for follow up with Dr. [name] - these symptoms are possibly not d/t different manufacturer and could be d/t another condition needing further evaluation.

## 2014-12-11 NOTE — Therapy (Signed)
[facility] [address] [place], [place], [address] Phone: [phone]   Fax:  [phone]  Occupational Therapy Treatment  Patient Details  Name: Phillip [name] MRN: [id number] Date of Birth: Jul 11, 1955 Referring Provider:  Leandrew Koyanagi, MD  Encounter Date: 12/11/2014      OT End of Session - 12/11/14 1628    Visit Number 2   Number of Visits 8   Date for OT Re-Evaluation 01/02/15   Authorization Type BCBS   Authorization Time Period Pt approved for 30 OT visits per calendar year   OT Start Time 1415  pt arrived late   OT Stop Time 1447   OT Time Calculation (min) 32 min   Activity Tolerance Patient tolerated treatment well      Past Medical History  Diagnosis Date  . Depression   . Encephalitis   . Anxiety     No past surgical history on file.  There were no vitals filed for this visit.  Visit Diagnosis:  Impaired cognition      Subjective Assessment - 12/11/14 1550    Subjective  I want to function better in my life - I know I was late today   Patient is accompained by: --  PCA [name]   Pertinent History see epic snapshot; pt with ADD, h/o of encephalomeningitis   Patient Stated Goals To try and improve  my cogntiive challenges   Currently in Pain? No/denies                      OT Treatments/Exercises (OP) - 12/11/14 0001    ADLs   ADL Comments Discussed goals with pt and PCA. Provided pt with referral and information for neuropsych for supportive counseling and med management for mood stabilization. Also provided information for voc rehab /independent living referral and provided website and referral info. Discussed plan to transition to [facility] to allow environmental and compensatory strategies to be implemented in pt's home for more effective problem solving and concrete implementation of strategies in the home (given pt's cogntive deficits, transfer of learning is highly  unlikely.  Pt and PCA in agreement with overall plan and goals and will follow up with recommendations. Pt has already had contact with social worker from inpt rehab regarding brain injury support group. Discussed importance of structured organization for basic ADL's and need for structured schedule and will help pt and PCA begin to problem solve next visit.                OT Education - 12/11/14 1623    Education provided Yes   Education Details neuropsch support and voc rehab information provided   Person(s) Educated Patient;Caregiver(s)   Methods Explanation;Handout   Comprehension Verbalized understanding             OT Long Term Goals - 12/11/14 1624    OT LONG TERM GOAL #1   Title Pt and PCA will demonstrate understanding of  organization strategies to assist with basic ADL's tasks   Status On-going   OT LONG TERM GOAL #2   Title Pt and PCA will demonstrate understanding of benefit of structured schedule to assist with organization, sequencing and simple carry over for familiar tasks   Status On-going   OT LONG TERM GOAL #3   Title Pt and PCA will be provided with information regarding voc rehab services for possible referral   Status Achieved   OT LONG TERM GOAL #4  Title Pt and PCA will demonstrate understanding of benefits of TBI support group as well as how to access group   Status Achieved   OT LONG TERM GOAL #5   Title Pt and PCA will explore potential for additional psyhological supports to assist wtih depression, anxiety and other documented neuropsych issues.   Status Achieved               Plan - 12/11/14 1624    Clinical Impression Statement Pt progressing toward goals. Will initiate begin strategies for basic ADL and schedule next visit in order to move toward transferring pt to [facility] to assist with implementation/problem solving in the home (given pt's cogntive deficits, transfer of learrning is unlikely from outpt clinic to home).  Given that  pt has fallen 2x in past 6 months pt could also benefit from PT assessment   Pt will benefit from skilled therapeutic intervention in order to improve on the following deficits (Retired) Other (comment)  cognition   Rehab Potential Fair   OT Frequency 2x / week   OT Duration 4 weeks   OT Treatment/Interventions Self-care/ADL training;Therapeutic activities;Patient/family education;Cognitive remediation/compensation   Plan check on referral info, begin home strategies for basic ADL tasks   Consulted and Agree with Plan of Care Patient;Family member/caregiver   Family Member Consulted [name] PCA        Problem List Patient Active Problem List   Diagnosis Date Noted  . Cognitive impairment 01/31/2013  . 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 ADD (attention deficit disorder) 03/10/2012  . Depression with anxiety 03/10/2012    Quay Burow, OTR/L 12/11/2014, 4:29 PM  [facility] [address] [place], [place], [address] Phone: [phone]   Fax:  [phone]

## 2014-12-14 ENCOUNTER — Ambulatory Visit: Payer: BLUE CROSS/BLUE SHIELD | Admitting: Occupational Therapy

## 2014-12-14 ENCOUNTER — Encounter: Payer: Self-pay | Admitting: Occupational Therapy

## 2014-12-14 DIAGNOSIS — R4189 Other symptoms and signs involving cognitive functions and awareness: Secondary | ICD-10-CM | POA: Diagnosis not present

## 2014-12-14 NOTE — Patient Instructions (Signed)
Organizational strategies discussed - see treatment note for details.

## 2014-12-14 NOTE — Therapy (Signed)
[facility] [address] [place], [place], [address] Phone: [phone]   Fax:  [phone]  Occupational Therapy Treatment  Patient Details  Name: Phillip [name] MRN: [id number] Date of Birth: [birth year] Referring Provider:  Leandrew Koyanagi, MD  Encounter Date: 12/14/2014      OT End of Session - 12/14/14 1703    Visit Number 3   Number of Visits 8   Date for OT Re-Evaluation 01/02/15   Authorization Type BCBS   Authorization Time Period Pt approved for 30 OT visits per calendar year   OT Start Time 1458  pt arrived late   OT Stop Time 1532   OT Time Calculation (min) 34 min   Activity Tolerance Patient tolerated treatment well      Past Medical History  Diagnosis Date  . Depression   . Encephalitis   . Anxiety     History reviewed. No pertinent past surgical history.  There were no vitals filed for this visit.  Visit Diagnosis:  Impaired cognition      Subjective Assessment - 12/14/14 1556    Subjective  I know I am late again - I am always late to everything   Pertinent History see epic snapshot; pt with ADD, h/o of encephalomeningitis   Patient Stated Goals To try and improve  my cogntiive challenges   Currently in Pain? No/denies                      OT Treatments/Exercises (OP) - 12/14/14 0001    Cognitive Exercises   Other Cognitive Exercises 1 Worked with pt to begin to put into place some strategies  for basic organization. Recommended that pt and PCA work together to first outline important life roles and then assign tasks to  life roles in order to eliminate or delegate any tasks that are not essential for pt to complete himself. Once this is done, recommended that pt and PCA work together to [employer] physical things in his home environment that are not necssary for specific tasks or to support life roles. Discussed importance of keeping  physical environment orgnanized in  order to facilitate sustained attention to current task as pt is easily distracted by objects in his environment. Also discussed need to routinize daily basic ADL's in order to make them more automatic for pt - i.e. getting up at same time every day, doing basic ADL's in same order every day as well as using block scheduling for the rest of his day to facilitate organizeation (i.e . try and make all outside appointments in the afternoon, use late morning for tasks around the house, etc. Pt and caregiver took notes and agreeable to working on these strategies at home.                 OT Education - 12/14/14 1603    Education provided Yes   Education Details organizational strategies for home   Person(s) Educated Patient;Caregiver(s)   Methods Explanation   Comprehension Verbalized understanding  pt and PCA took notes             OT Long Term Goals - 12/14/14 1604    OT LONG TERM GOAL #1   Title Pt and PCA will demonstrate understanding of  organization strategies to assist with basic ADL's tasks   Status On-going   OT LONG TERM GOAL #2   Title Pt and PCA will demonstrate understanding of benefit of structured schedule to  assist with organization, sequencing and simple carry over for familiar tasks   Status On-going   OT LONG TERM GOAL #3   Title Pt and PCA will be provided with information regarding voc rehab services for possible referral   Status Achieved   OT LONG TERM GOAL #4   Title Pt and PCA will demonstrate understanding of benefits of TBI support group as well as how to access group   Status Achieved   OT LONG TERM GOAL #5   Title Pt and PCA will explore potential for additional psyhological supports to assist wtih depression, anxiety and other documented neuropsych issues.   Status Achieved               Plan - 12/14/14 1604    Clinical Impression Statement Pt making slow progress toward goals with assistance of PCA. Pt has followed up with TBI support  contact. Pt open to suggestions for organizational strategies.   Pt will benefit from skilled therapeutic intervention in order to improve on the following deficits (Retired) Other (comment)  cognition   Rehab Potential Fair   OT Frequency 2x / week   OT Duration 4 weeks   OT Treatment/Interventions Self-care/ADL training;Therapeutic activities;Patient/family education;Cognitive remediation/compensation   Plan cont to work on Biomedical engineer, facilitate transfer to HHOT/PT   Consulted and Agree with Plan of Care Patient;Family member/caregiver   Family Member Consulted [name] PCA        Problem List Patient Active Problem List   Diagnosis Date Noted  . Cognitive impairment 01/31/2013  . 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 ADD (attention deficit disorder) 03/10/2012  . Depression with anxiety 03/10/2012    Quay Burow, OTR/L 12/14/2014, 5:05 PM  [facility] [address] [place], [place], [address] Phone: [phone]   Fax:  [phone]

## 2014-12-20 ENCOUNTER — Telehealth: Payer: Self-pay

## 2014-12-20 DIAGNOSIS — R4189 Other symptoms and signs involving cognitive functions and awareness: Secondary | ICD-10-CM

## 2014-12-20 NOTE — Telephone Encounter (Signed)
Can we place referrals?

## 2014-12-20 NOTE — Telephone Encounter (Signed)
Patient's caretaker, [name], called to request two referrals for the patient.  The patient's occupational therapist requested the following referrals: one referral for physical therapy evaluation and treatment (due to the patient's recent fall), and also a new referral for occupational therapy evaluation and treatment (due to cognitive impairment).  If more information is needed, please contact the caregiver at [phone].  Thanks.

## 2014-12-21 NOTE — Telephone Encounter (Signed)
[name] patient's caregiver calling back to check the status of her previous request for PT/OT. Per [name] she does not need a referral for patient because Dr [name] has already told her where to take patient. She stated she only needed orders for patient to have PT/OT. [name] states she is needing this asap. If any questions please contact her at [phone]

## 2014-12-22 NOTE — Telephone Encounter (Signed)
Dr. [name] can we send the orders over on this patient for the OT/PT.  thanks

## 2014-12-23 NOTE — Telephone Encounter (Signed)
yes

## 2014-12-25 ENCOUNTER — Encounter: Payer: BLUE CROSS/BLUE SHIELD | Admitting: Occupational Therapy

## 2014-12-25 NOTE — Telephone Encounter (Signed)
Fax to [facility]  PT eval and Treatment due to falls  OT evaluation for cognitive impairment

## 2014-12-26 ENCOUNTER — Encounter: Payer: BLUE CROSS/BLUE SHIELD | Admitting: Occupational Therapy

## 2014-12-29 ENCOUNTER — Telehealth: Payer: Self-pay

## 2014-12-29 NOTE — Telephone Encounter (Signed)
Blaire from [facility] is calling because they never received any demographic information for the patient. She states that she prefers it to be faxed. Fax # [phone] Attn: Hilaria Ota at [facility]

## 2014-12-30 NOTE — Telephone Encounter (Signed)
Faxed Demographics

## 2015-01-01 ENCOUNTER — Encounter: Payer: Self-pay | Admitting: Occupational Therapy

## 2015-01-01 DIAGNOSIS — R4189 Other symptoms and signs involving cognitive functions and awareness: Secondary | ICD-10-CM

## 2015-01-01 NOTE — Therapy (Signed)
[facility] [address] [place], [place], [address] Phone: [phone]   Fax:  [phone]  Occupational Therapy Treatment  Patient Details  Name: Phillip [name] MRN: [id number] Date of Birth: 01/06/1956 Referring Provider:  No ref. provider found  Encounter Date: 01/01/2015    Past Medical History  Diagnosis Date  . Depression   . Encephalitis   . Anxiety     No past surgical history on file.  There were no vitals filed for this visit.  Visit Diagnosis:  Impaired cognition                                 OT Long Term Goals - 01/01/15 1622    OT LONG TERM GOAL #1   Title Pt and PCA will demonstrate understanding of  organization strategies to assist with basic ADL's tasks   Status Partially Met   OT LONG TERM GOAL #2   Title Pt and PCA will demonstrate understanding of benefit of structured schedule to assist with organization, sequencing and simple carry over for familiar tasks   Status Partially Met   OT LONG TERM GOAL #3   Title Pt and PCA will be provided with information regarding voc rehab services for possible referral   Status Achieved   OT LONG TERM GOAL #4   Title Pt and PCA will demonstrate understanding of benefits of TBI support group as well as how to access group   Status Achieved   OT LONG TERM GOAL #5   Title Pt and PCA will explore potential for additional psyhological supports to assist wtih depression, anxiety and other documented neuropsych issues.   Status Achieved               Plan - 01/01/15 1623    Clinical Impression Statement Pt and PCA actively motivated to participate in therapy and have met or parially met all goals. Pt will benefit a great deal more from HHOT to address implementation of strategies in his own home. Have begun initial education of strategies however given pt's extensive history and signficant cogntive deficits, pt will have  great difficulty transferring strategies from the clinic to his home environment. Spoke with pt's insurance company who is aware of plan and confirms that pt has HH benefits. PCA has all information and will follow up with MD for HH referral. Pt and PCA have name of contac at [facility] for HH services.    Pt will benefit from skilled therapeutic intervention in order to improve on the following deficits (Retired) Other (comment)  cognition   Rehab Potential Fair   OT Frequency 2x / week   OT Duration 4 weeks   OT Treatment/Interventions Self-care/ADL training;Therapeutic activities;Patient/family education;Cognitive remediation/compensation   Plan d/c from outpt and refer to HHOT   Consulted and Agree with Plan of Care Patient;Family member/caregiver   Family Member Consulted Karen PCA        Problem List Patient Active Problem List   Diagnosis Date Noted  . Cognitive impairment 01/31/2013  . 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 ADD (attention deficit disorder) 03/10/2012  . Depression with anxiety 03/10/2012   OCCUPATIONAL THERAPY DISCHARGE SUMMARY  Visits from Start of Care: 4  Current functional level related to goals / functional outcomes: See above goals   Remaining deficits: Cognitive deficits, potential fall risk (pt has fallen twice in past 6 months)   Education / Equipment: Basic strategies for   ADL and home organization, info for voc rehab and counseling support as well TBI support group. Plan: Patient agrees to discharge.  Patient goals were partially met. Patient is being discharged due to meeting the stated rehab goals.  ?????     Quay Burow, OTR/L 01/01/2015, 4:28 PM  [facility] [address] [place], [place], [address] Phone: [phone]   Fax:  [phone]

## 2015-01-02 ENCOUNTER — Encounter: Payer: BLUE CROSS/BLUE SHIELD | Admitting: Occupational Therapy

## 2015-01-03 ENCOUNTER — Encounter: Payer: Self-pay | Admitting: Internal Medicine

## 2015-01-03 ENCOUNTER — Ambulatory Visit (INDEPENDENT_AMBULATORY_CARE_PROVIDER_SITE_OTHER): Payer: BLUE CROSS/BLUE SHIELD | Admitting: Internal Medicine

## 2015-01-03 VITALS — BP 125/79 | HR 76 | Temp 97.8°F | Resp 16 | Ht 73.0 in | Wt 176.2 lb

## 2015-01-03 DIAGNOSIS — Z87891 Personal history of nicotine dependence: Secondary | ICD-10-CM | POA: Diagnosis not present

## 2015-01-03 DIAGNOSIS — R4189 Other symptoms and signs involving cognitive functions and awareness: Secondary | ICD-10-CM

## 2015-01-03 DIAGNOSIS — G0490 Encephalitis and encephalomyelitis, unspecified: Secondary | ICD-10-CM | POA: Diagnosis not present

## 2015-01-03 DIAGNOSIS — E785 Hyperlipidemia, unspecified: Secondary | ICD-10-CM

## 2015-01-03 DIAGNOSIS — R634 Abnormal weight loss: Secondary | ICD-10-CM

## 2015-01-03 LAB — LIPID PANEL
Cholesterol: 196 mg/dL (ref 125–200)
HDL: 47 mg/dL (ref >=40)
LDL CALC: 134 mg/dL — AB (ref <130)
TRIGLYCERIDES: 76 mg/dL (ref <150)
Total CHOL/HDL Ratio: 4.2 Ratio (ref <=5.0)
VLDL: 15 mg/dL (ref <30)

## 2015-01-03 LAB — COMPREHENSIVE METABOLIC PANEL
ALT: 19 U/L (ref 9–46)
AST: 20 U/L (ref 10–35)
Albumin: 5 g/dL (ref 3.6–5.1)
Alkaline Phosphatase: 99 U/L (ref 40–115)
BUN: 21 mg/dL (ref 7–25)
CHLORIDE: 103 mmol/L (ref 98–110)
CO2: 23 mmol/L (ref 20–31)
CREATININE: 0.71 mg/dL (ref 0.70–1.33)
Calcium: 9.7 mg/dL (ref 8.6–10.3)
Glucose, Bld: 107 mg/dL — ABNORMAL HIGH (ref 65–99)
Potassium: 4.6 mmol/L (ref 3.5–5.3)
SODIUM: 137 mmol/L (ref 135–146)
Total Bilirubin: 0.5 mg/dL (ref 0.2–1.2)
Total Protein: 6.9 g/dL (ref 6.1–8.1)

## 2015-01-03 NOTE — Progress Notes (Addendum)
   Subjective:    Patient ID: Phillip [name], male    DOB: 09-15-1955, 60 y.o.   MRN: [id number]  HPI This is a 59 yo male who is accompanied by care assistant.  He presents today to discuss weight loss. He has gained a few pounds since he started eating more regularly. Was around 180 pounds for a long time and had lost to 174 He has noticed some decreased exercise tolerance for the last 4- 6 weeks. He has had 2 episodes of left upper chest irritation while exerting himself- exercise, mowing lawn. Episodes lasted about 20 minutes. He was diaphoretic at the time, but is often diaphoretic with the heat. He feels like the SOB has resolved.   Sleeping better. Feels rested 30-40% of the time. Feels like he is doing well on Adderall.   Patient Active Problem List   Diagnosis Date Noted  . Cognitive impairment- has recently started OT and he is hopeful that this will be productive. He is also going to start attending a support group.   01/31/2013  . 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 ADD (attention deficit disorder) 03/10/2012  . Depression with anxiety 03/10/2012   Past Medical History  Diagnosis Date  . Depression   . Encephalitis   . Anxiety    History reviewed. No pertinent past surgical history. Family History  Problem Relation Age of Onset  . Hyperlipidemia Father   . Heart disease Maternal Grandfather   . Cancer Paternal Grandfather   . Mental retardation Mother    Social History  Substance Use Topics  . Smoking status: Former Research scientist (life sciences)  . Smokeless tobacco: Never Used  . Alcohol Use: 0.0 oz/week    0 Standard drinks or equivalent per week     Comment: 8 drinks      Review of Systems noncontributory    Objective:   Physical Exam Unchanged from last presentation   BP 125/79 mmHg  Pulse 76  Temp(Src) 97.8 F (36.6 C) (Oral)  Resp 16  Ht 6\' 1"  (1.854 m)  Wt 176 lb 3.2 oz (79.924 kg)  BMI 23.25 kg/m2 Wt Readings from Last 3 Encounters:  01/03/15 176 lb 3.2 oz  (79.924 kg)  11/15/14 174 lb 12.8 oz (79.289 kg)  06/22/14 179 lb (81.194 kg)      Assessment & Plan:  1. Cognitive impairment - This is currently stable and patient having OT to work on optimizing his abilities.   2. Encephalomeningitis -  3. Hyperlipidemia - Lipid panel  4. Loss of weight - He has gained a couple of pounds since his visit last month with more intentional eating.  - Comprehensive metabolic panel - CBC with Differential/Platelet  5. History of cigarette smoking - CT CHEST LUNG CA SCREEN LOW DOSE W/O CM; Future   Clarene Reamer, FNP-BC  [facility]  01/03/2015 1:44 PM I have participated in the care of this patient with the Advanced Practice Provider and agree with Diagnosis and Plan as documented. Robert P. [name], M.D.

## 2015-01-03 NOTE — Addendum Note (Signed)
Addended by: Leandrew Koyanagi on: 01/03/2015 11:42 PM   Modules accepted: Level of Service

## 2015-01-04 ENCOUNTER — Encounter: Payer: BLUE CROSS/BLUE SHIELD | Admitting: Occupational Therapy

## 2015-01-04 ENCOUNTER — Telehealth: Payer: Self-pay

## 2015-01-04 LAB — CBC WITH DIFFERENTIAL/PLATELET
BASOS PCT: 1 % (ref 0–1)
Basophils Absolute: 0.1 10*3/uL (ref 0.0–0.1)
EOS ABS: 0.1 10*3/uL (ref 0.0–0.7)
Eosinophils Relative: 2 % (ref 0–5)
HCT: 41.3 % (ref 39.0–52.0)
Hemoglobin: 14.1 g/dL (ref 13.0–17.0)
Lymphocytes Relative: 26 % (ref 12–46)
Lymphs Abs: 1.6 10*3/uL (ref 0.7–4.0)
MCH: 29.6 pg (ref 26.0–34.0)
MCHC: 34.1 g/dL (ref 30.0–36.0)
MCV: 86.8 fL (ref 78.0–100.0)
MONOS PCT: 9 % (ref 3–12)
MPV: 9.3 fL (ref 8.6–12.4)
Monocytes Absolute: 0.6 10*3/uL (ref 0.1–1.0)
NEUTROS PCT: 62 % (ref 43–77)
Neutro Abs: 3.9 10*3/uL (ref 1.7–7.7)
PLATELETS: 311 10*3/uL (ref 150–400)
RBC: 4.76 MIL/uL (ref 4.22–5.81)
RDW: 13.2 % (ref 11.5–15.5)
WBC: 6.3 10*3/uL (ref 4.0–10.5)

## 2015-01-04 NOTE — Telephone Encounter (Signed)
Patient is calling to request a refill for viagara sent to [facility] in [place]

## 2015-01-05 ENCOUNTER — Telehealth: Payer: Self-pay | Admitting: *Deleted

## 2015-01-05 DIAGNOSIS — F988 Other specified behavioral and emotional disorders with onset usually occurring in childhood and adolescence: Secondary | ICD-10-CM

## 2015-01-05 MED ORDER — SILDENAFIL CITRATE 100 MG PO TABS: ORAL_TABLET | ORAL | Status: DC

## 2015-01-05 NOTE — Telephone Encounter (Signed)
Left message Rx was sent in

## 2015-01-05 NOTE — Telephone Encounter (Signed)
Pt caregiver [name]) called stating that this pt is out of his Adderall.  She states that he did not receive any refills with his last Rx and since he is out she is worried because his behavior will change.  She states the pt needs this pt tonight asap.  She may be reached at [phone] and if she does not answer to please leave an message and she will return the call.

## 2015-01-05 NOTE — Telephone Encounter (Signed)
Meds ordered this encounter  Medications  . sildenafil (VIAGRA) 100 MG tablet    Sig: TAKE 1/2 TO 1 TABLET DAILY AS NEEDED FOR ERECTILE DYSFUNCTION.    Dispense:  4 tablet    Refill:  1

## 2015-01-06 NOTE — Telephone Encounter (Signed)
Patients caregiver [name] calling back to check the status of his refill on Adderall. I informed her it was still in process. Per [name] he will take his last one today and she is concerned his behavioral will change. She is requesting it to be filled today. Please call karen when ready to be picked up at [phone]

## 2015-01-06 NOTE — Telephone Encounter (Signed)
Patients caregiver called back in stating that she needs this medication filled ASAP because he is completely out of his meds as of 5:30pm tonight. I spoke with [name] and looked over Phillip [name]'s note from his last OV, i let her know that since he had not been seen since July 2016 for this medication, she would need to bring him back in to be seen and reexamed for this. I let her know that [name] would be here Sunday 01/07/15 from 8-4. She was very upset and wanted both my name and Davina's name.

## 2015-01-07 ENCOUNTER — Other Ambulatory Visit: Payer: Self-pay | Admitting: Internal Medicine

## 2015-01-07 DIAGNOSIS — F988 Other specified behavioral and emotional disorders with onset usually occurring in childhood and adolescence: Secondary | ICD-10-CM

## 2015-01-07 MED ORDER — ADDERALL 30 MG PO TABS: 30.0000 mg | ORAL_TABLET | Freq: Three times a day (TID) | ORAL | Status: DC

## 2015-01-07 MED ORDER — AMPHETAMINE-DEXTROAMPHETAMINE 30 MG PO TABS: 30.0000 mg | ORAL_TABLET | Freq: Three times a day (TID) | ORAL | Status: DC

## 2015-01-07 NOTE — Telephone Encounter (Signed)
Okay to refill.  Will forward to staff who is in clinic today so that they may sign in my absence.  Patient should be filled exactly how PA Bush last filled, but with one refill, as this would equal 3 refills since patient was last refilled. If any questions please call me at [phone].  Philis Fendt, MS, PA-C   9:17 AM, 01/07/2015

## 2015-01-07 NOTE — Telephone Encounter (Signed)
Refilled per PA-Michael's instructions.

## 2015-01-08 NOTE — Telephone Encounter (Signed)
Rx ready to pick up. Raynaldo Opitz on her voicemail.

## 2015-01-09 ENCOUNTER — Encounter: Payer: BLUE CROSS/BLUE SHIELD | Admitting: Occupational Therapy

## 2015-01-11 ENCOUNTER — Encounter: Payer: BLUE CROSS/BLUE SHIELD | Admitting: Occupational Therapy

## 2015-01-11 ENCOUNTER — Telehealth: Payer: Self-pay

## 2015-01-11 NOTE — Telephone Encounter (Signed)
Sharyn Lull with [facility] is needing a verbal order to continue with the physical therapy at least 2 more times [phone]

## 2015-01-11 NOTE — Telephone Encounter (Signed)
Left voicemail giving verbal orders.  

## 2015-01-11 NOTE — Telephone Encounter (Signed)
I support that order --please call it

## 2015-01-16 ENCOUNTER — Encounter: Payer: BLUE CROSS/BLUE SHIELD | Admitting: Occupational Therapy

## 2015-01-18 ENCOUNTER — Encounter: Payer: BLUE CROSS/BLUE SHIELD | Admitting: Occupational Therapy

## 2015-01-23 ENCOUNTER — Encounter: Payer: BLUE CROSS/BLUE SHIELD | Admitting: Occupational Therapy

## 2015-01-25 ENCOUNTER — Encounter: Payer: BLUE CROSS/BLUE SHIELD | Admitting: Occupational Therapy

## 2015-01-30 ENCOUNTER — Encounter: Payer: BLUE CROSS/BLUE SHIELD | Admitting: Occupational Therapy

## 2015-02-01 ENCOUNTER — Encounter: Payer: BLUE CROSS/BLUE SHIELD | Admitting: Occupational Therapy

## 2015-02-06 ENCOUNTER — Encounter: Payer: BLUE CROSS/BLUE SHIELD | Admitting: Occupational Therapy

## 2015-02-08 ENCOUNTER — Encounter: Payer: BLUE CROSS/BLUE SHIELD | Admitting: Occupational Therapy

## 2015-03-05 ENCOUNTER — Telehealth: Payer: Self-pay

## 2015-03-05 ENCOUNTER — Other Ambulatory Visit: Payer: Self-pay | Admitting: Internal Medicine

## 2015-03-05 NOTE — Telephone Encounter (Signed)
Pharm had faxed req to fill lorazepam and adderall a few days early on 11/4 d/t pt going out of town. He reported that pt last got the adderall filled on 10/6 and lorazepam on 10/7. Spoke to Dr [name] and he OKd the refills on 11/4. Advised pharm.

## 2015-03-07 ENCOUNTER — Other Ambulatory Visit: Payer: Self-pay | Admitting: Internal Medicine

## 2015-03-08 NOTE — Telephone Encounter (Signed)
Pt has an appt with you 06/20/15

## 2015-03-08 NOTE — Telephone Encounter (Signed)
Meds ordered this encounter  Medications  . LORazepam (ATIVAN) 2 MG tablet    Sig: TAKE 1 TABLET BY MOUTH AT BEDTIME    Dispense:  30 tablet    Refill:  2    This request is for a new prescription for a controlled substance as required by Federal/State law.Madaline Brilliant to call in

## 2015-03-09 NOTE — Telephone Encounter (Signed)
Called in.

## 2015-04-02 ENCOUNTER — Encounter: Payer: Self-pay | Admitting: Internal Medicine

## 2015-04-02 ENCOUNTER — Telehealth: Payer: Self-pay | Admitting: Acute Care

## 2015-04-02 NOTE — Telephone Encounter (Signed)
Left Message to make Appointment x4.   Sent letter to patient.  Dear Phillip [name], We have attempted to call you several times to schedule the lung screening Dr. Carlean Purl requested you have performed. We have been unable to contact you by phone. Please call the number below at your earliest convenience so that we can get you scheduled for your screening. We look forward to participating in your care.  Thank you,  [facility] [phone]

## 2015-04-11 ENCOUNTER — Telehealth: Payer: Self-pay

## 2015-04-11 DIAGNOSIS — F988 Other specified behavioral and emotional disorders with onset usually occurring in childhood and adolescence: Secondary | ICD-10-CM

## 2015-04-11 MED ORDER — ADDERALL 30 MG PO TABS: 30.0000 mg | ORAL_TABLET | Freq: Three times a day (TID) | ORAL | Status: DC

## 2015-04-11 NOTE — Telephone Encounter (Signed)
Meds ordered this encounter  Medications  . ADDERALL 30 MG tablet    Sig: Take 1 tablet by mouth 3 (three) times daily.    Dispense:  90 tablet    Refill:  0    Fill w/generic, needs to be manufactured by [facility].  Marland Kitchen ADDERALL 30 MG tablet    Sig: Take 1 tablet by mouth 3 (three) times daily. For 30d after signed    Dispense:  90 tablet    Refill:  0    Fill w/generic, NOT MANUFACTURED BY [employer]. Preferred made by [place]

## 2015-04-11 NOTE — Telephone Encounter (Signed)
Pt needs a refill on adderall   Please call when ready to pick up patient is out

## 2015-04-11 NOTE — Telephone Encounter (Signed)
Notified [name] Rxs are ready.

## 2015-04-11 NOTE — Telephone Encounter (Signed)
REFILL REQUEST   ADDERALL 30 MG tablet  Special order - remember to order the right thing.   [phone]

## 2015-04-12 ENCOUNTER — Other Ambulatory Visit: Payer: Self-pay | Admitting: Acute Care

## 2015-04-12 DIAGNOSIS — Z87891 Personal history of nicotine dependence: Secondary | ICD-10-CM

## 2015-04-25 ENCOUNTER — Telehealth: Payer: Self-pay

## 2015-04-25 NOTE — Telephone Encounter (Signed)
PA was approved for Adderall through 04/22/16.

## 2015-05-06 DIAGNOSIS — A449 Bartonellosis, unspecified: Secondary | ICD-10-CM

## 2015-05-06 DIAGNOSIS — IMO0001 Reserved for inherently not codable concepts without codable children: Secondary | ICD-10-CM

## 2015-05-06 DIAGNOSIS — A770 Spotted fever due to Rickettsia rickettsii: Secondary | ICD-10-CM

## 2015-05-06 HISTORY — DX: Reserved for inherently not codable concepts without codable children: IMO0001

## 2015-05-06 HISTORY — DX: Bartonellosis, unspecified: A44.9

## 2015-05-06 HISTORY — DX: Spotted fever due to Rickettsia rickettsii: A77.0

## 2015-05-09 ENCOUNTER — Ambulatory Visit
Admission: RE | Admit: 2015-05-09 | Discharge: 2015-05-09 | Disposition: A | Discharge status: Home / Self Care | Payer: BLUE CROSS/BLUE SHIELD | Source: Ambulatory Visit | Attending: Acute Care | Admitting: Acute Care

## 2015-05-09 ENCOUNTER — Ambulatory Visit (INDEPENDENT_AMBULATORY_CARE_PROVIDER_SITE_OTHER): Payer: BLUE CROSS/BLUE SHIELD | Admitting: Acute Care

## 2015-05-09 ENCOUNTER — Encounter: Payer: Self-pay | Admitting: Acute Care

## 2015-05-09 DIAGNOSIS — Z87891 Personal history of nicotine dependence: Secondary | ICD-10-CM

## 2015-05-09 NOTE — Progress Notes (Signed)
Shared Decision Making Visit Lung Cancer Screening Program [phone])   Eligibility:  Age 60 y.o.  Pack Years Smoking History Calculation 37.5 (# packs/per year x # years smoked)  Recent History of coughing up blood : No  Unexplained weight loss? no ( >Than 15 pounds within the last 6 months )  Prior History Lung / other cancer no (Diagnosis within the last 5 years already requiring surveillance chest CT Scans).  Smoking Status Former Smoker  Former Smokers: Years since quit: 9 years  Quit Date:2007  Visit Components:  Discussion included one or more decision making aids. yes  Discussion included risk/benefits of screening. yes  Discussion included potential follow up diagnostic testing for abnormal scans. yes  Discussion included meaning and risk of over diagnosis. yes  Discussion included meaning and risk of False Positives. yes  Discussion included meaning of total radiation exposure. yes  Counseling Included:  Importance of adherence to annual lung cancer LDCT screening. yes  Impact of comorbidities on ability to participate in the program. yes  Ability and willingness to under diagnostic treatment. yes  Smoking Cessation Counseling:  Current Smokers:   Discussed importance of smoking cessation. NA: Former Smoker  Information about tobacco cessation classes and interventions provided to patient. yes  Patient provided with "ticket" for LDCT Scan. yes  Symptomatic Patient. no  Counseling: NA  Diagnosis Code: Tobacco Use Z72.0  Asymptomatic Patient yes  Counseling NA; former smoker  Former Smokers:   Discussed the importance of maintaining cigarette abstinence. yes  Diagnosis Code: Personal History of Nicotine Dependence. [id number]  Information about tobacco cessation classes and interventions provided to patient. Yes  Patient provided with "ticket" for LDCT Scan. yes  Written Order for Lung Cancer Screening with LDCT placed in Epic. Yes (CT  Chest Lung Cancer Screening Low Dose W/O CM) [id number] Z12.2-Screening of respiratory organs Z87.891-Personal history of nicotine dependence  I spent 15 minutes of face to face time with Phillip [name] discussing the risks and benefits of lung cancer screening. We viewed a power point together that explained in detail the above noted topics. We took the time to pause the power point at intervals to allow for questions to be asked and answered to ensure understanding. We discussed that he had taken the single most powerful action possible to decrease his risk of developing lung cancer when he quit smoking. I counseled him to remain smoke free, and to contact me if he ever had the desire to smoke again so that I can provide resources and tools to help support the effort to remain smoke free. We discussed the time and location of the scan, and that either [place] or I will call with the results within  24-48 hours of receiving them. He has my card and contact information in the event he needs to speak with me, in addition to a copy of the power point we reviewed as a resource. Phillip [name] verbalized understanding of all of the above and had no further questions upon leaving the office.   Magdalen Spatz, NP

## 2015-06-05 ENCOUNTER — Telehealth: Payer: Self-pay

## 2015-06-05 NOTE — Telephone Encounter (Signed)
Got fax stating that TID dosing of generic adderall requires a PA. There should be one already on file for pt through next Dec,  But I completed again on covermymeds. Pending.

## 2015-06-06 NOTE — Telephone Encounter (Signed)
PA was approved from 06/05/15 - 06/03/16. I had asked ins on form to back date approval to 05/22/15 when pt paid cash for Rx, but they did not do so. I notified pharm and suggested that pt may call ins cust service who may be able to back date it.

## 2015-06-06 NOTE — Telephone Encounter (Signed)
Notified [name] that PA was approved for the next yr w/new ins.

## 2015-06-20 ENCOUNTER — Ambulatory Visit (INDEPENDENT_AMBULATORY_CARE_PROVIDER_SITE_OTHER): Payer: BLUE CROSS/BLUE SHIELD | Admitting: Internal Medicine

## 2015-06-20 ENCOUNTER — Encounter: Payer: Self-pay | Admitting: Internal Medicine

## 2015-06-20 VITALS — BP 114/73 | HR 76 | Temp 97.6°F | Resp 16 | Ht 73.0 in | Wt 176.0 lb

## 2015-06-20 DIAGNOSIS — F909 Attention-deficit hyperactivity disorder, unspecified type: Secondary | ICD-10-CM

## 2015-06-20 DIAGNOSIS — F988 Other specified behavioral and emotional disorders with onset usually occurring in childhood and adolescence: Secondary | ICD-10-CM

## 2015-06-20 DIAGNOSIS — G0490 Encephalitis and encephalomyelitis, unspecified: Secondary | ICD-10-CM

## 2015-06-20 MED ORDER — LORAZEPAM 2 MG PO TABS: 2.0000 mg | ORAL_TABLET | Freq: Every day | ORAL | Status: DC

## 2015-06-20 MED ORDER — ADDERALL 30 MG PO TABS: 30.0000 mg | ORAL_TABLET | Freq: Three times a day (TID) | ORAL | Status: DC

## 2015-06-20 MED ORDER — AMPHETAMINE-DEXTROAMPHETAMINE 30 MG PO TABS: 30.0000 mg | ORAL_TABLET | Freq: Three times a day (TID) | ORAL | Status: DC
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
## 2015-07-01 ENCOUNTER — Ambulatory Visit (INDEPENDENT_AMBULATORY_CARE_PROVIDER_SITE_OTHER): Payer: BLUE CROSS/BLUE SHIELD | Admitting: Family Medicine

## 2015-07-01 VITALS — BP 124/76 | HR 76 | Temp 98.6°F | Resp 14 | Ht 73.0 in | Wt 182.0 lb

## 2015-07-01 DIAGNOSIS — R509 Fever, unspecified: Secondary | ICD-10-CM | POA: Diagnosis not present

## 2015-07-01 DIAGNOSIS — J209 Acute bronchitis, unspecified: Secondary | ICD-10-CM

## 2015-07-01 LAB — POCT CBC
GRANULOCYTE PERCENT: 61.9 % (ref 37–80)
HEMATOCRIT: 40.7 % — AB (ref 43.5–53.7)
Hemoglobin: 14.1 g/dL (ref 14.1–18.1)
Lymph, poc: 1.1 (ref 0.6–3.4)
MCH: 30.2 pg (ref 27–31.2)
MCHC: 34.6 g/dL (ref 31.8–35.4)
MCV: 87.3 fL (ref 80–97)
MID (CBC): 1 — AB (ref 0–0.9)
MPV: 6.6 fL (ref 0–99.8)
PLATELET COUNT, POC: 233 10*3/uL (ref 142–424)
POC GRANULOCYTE: 3.5 (ref 2–6.9)
POC LYMPH PERCENT: 20.3 %L (ref 10–50)
POC MID %: 17.8 % — AB (ref 0–12)
RBC: 4.66 M/uL — AB (ref 4.69–6.13)
RDW, POC: 12.6 %
WBC: 5.6 10*3/uL (ref 4.6–10.2)

## 2015-07-01 LAB — POCT INFLUENZA A/B
INFLUENZA B, POC: NEGATIVE
Influenza A, POC: NEGATIVE

## 2015-07-01 MED ORDER — MUCINEX DM MAXIMUM STRENGTH 60-1200 MG PO TB12: 1.0000 | ORAL_TABLET | Freq: Two times a day (BID) | ORAL | Status: DC

## 2015-07-01 MED ORDER — AZITHROMYCIN 250 MG PO TABS
ORAL_TABLET | ORAL | Status: DC
Start: 2015-07-01 — End: 2015-08-29

## 2015-07-01 MED ORDER — ALBUTEROL SULFATE (2.5 MG/3ML) 0.083% IN NEBU
2.5000 mg | INHALATION_SOLUTION | Freq: Once | RESPIRATORY_TRACT | Status: AC
  Administered 2015-07-01: 2.5 mg via RESPIRATORY_TRACT

## 2015-07-01 MED ORDER — BENZONATATE 100 MG PO CAPS: 100.0000 mg | ORAL_CAPSULE | Freq: Three times a day (TID) | ORAL | Status: DC | PRN

## 2015-07-01 NOTE — Progress Notes (Signed)
Subjective:    Patient ID: Phillip [name], male    DOB: [birth year], 60 y.o.   MRN: [id number] By signing my name below, I, Judithe Modest, attest that this documentation has been prepared under the direction and in the presence of Delman Cheadle, MD. Electronically Signed: Judithe Modest, ER Scribe. 07/01/2015. 10:22 AM.  Chief Complaint  Patient presents with  . Fever    yesterday 102.0  . Cough    sinces Friday  . Shortness of Breath    tightness    HPI HPI Comments: Phillip [name] is a 60 y.o. male who presents to [facility] complaining of cough, SOB, and fever for the last two days. He has pain in his lungs when he breaths deeply. His fever was 102.1 yesterday. He is taking elderberry, zinc, and a lung support natural remedy.   He has had a screening lung CT two weeks ago which was normal. 35 pac year smoking hx. He quit nine years ago.    Past Medical History  Diagnosis Date  . Depression   . Encephalitis   . Anxiety    Allergies  Allergen Reactions  . Penicillins Hives   Current Outpatient Prescriptions on File Prior to Visit  Medication Sig Dispense Refill  . Omega-3 Fatty Acids (FISH OIL PO) Take 1 capsule by mouth daily.    . sildenafil (VIAGRA) 100 MG tablet TAKE 1/2 TO 1 TABLET DAILY AS NEEDED FOR ERECTILE DYSFUNCTION. 4 tablet 1  . ADDERALL 30 MG tablet Take 1 tablet by mouth 3 (three) times daily. 90 tablet 0  . LORazepam (ATIVAN) 2 MG tablet Take 1 tablet (2 mg total) by mouth at bedtime. 30 tablet 5  . OVER THE COUNTER MEDICATION Brain/Memory taking 2 a day     No current facility-administered medications on file prior to visit.    Review of Systems  Constitutional: Positive for fever, chills, diaphoresis, activity change, appetite change and fatigue. Negative for unexpected weight change.  HENT: Positive for congestion, postnasal drip, rhinorrhea and sore throat. Negative for ear pain, mouth sores and sinus pressure.   Respiratory: Positive for cough, chest  tightness and shortness of breath.   Cardiovascular: Positive for chest pain.  Gastrointestinal: Negative for vomiting and abdominal pain.  Genitourinary: Negative for dysuria.  Musculoskeletal: Negative for myalgias, arthralgias, neck pain and neck stiffness.  Skin: Negative for rash.  Neurological: Positive for headaches. Negative for syncope.  Hematological: Negative for adenopathy.  Psychiatric/Behavioral: Positive for sleep disturbance.      Objective:  BP 124/76 mmHg  Pulse 76  Temp(Src) 98.6 F (37 C) (Oral)  Resp 14  Ht 6\' 1"  (1.854 m)  Wt 182 lb (82.555 kg)  BMI 24.02 kg/m2  SpO2 96%  Physical Exam  Constitutional: He is oriented to person, place, and time. He appears well-developed and well-nourished. No distress.  HENT:  Head: Normocephalic and atraumatic.  Mid ear effusion bilaterally. Nasal edema and rhinorrhea.   Eyes: Pupils are equal, round, and reactive to light.  Neck: Neck supple.  No cervical adenopathy. Thyroid stable.   Cardiovascular: Normal rate.   Pulmonary/Chest: Effort normal. No respiratory distress.  Lungs clear  Musculoskeletal: Normal range of motion.  Neurological: He is alert and oriented to person, place, and time. Coordination normal.  Skin: Skin is warm and dry. He is not diaphoretic.  Psychiatric: He has a normal mood and affect. His behavior is normal.  Nursing note and vitals reviewed.  Deep breathing induces hacking cough.  Results for orders placed or performed in visit on 07/01/15  POCT Influenza A/B  Result Value Ref Range   Influenza A, POC Negative Negative   Influenza B, POC Negative Negative  POCT CBC  Result Value Ref Range   WBC 5.6 4.6 - 10.2 K/uL   Lymph, poc 1.1 0.6 - 3.4   POC LYMPH PERCENT 20.3 10 - 50 %L   MID (cbc) 1.0 (A) 0 - 0.9   POC MID % 17.8 (A) 0 - 12 %M   POC Granulocyte 3.5 2 - 6.9   Granulocyte percent 61.9 37 - 80 %G   RBC 4.66 (A) 4.69 - 6.13 M/uL   Hemoglobin 14.1 14.1 - 18.1 g/dL   HCT, POC  40.7 (A) 43.5 - 53.7 %   MCV 87.3 80 - 97 fL   MCH, POC 30.2 27 - 31.2 pg   MCHC 34.6 31.8 - 35.4 g/dL   RDW, POC 12.6 %   Platelet Count, POC 233 142 - 424 K/uL   MPV 6.6 0 - 99.8 fL       Assessment & Plan:   1. Fever, unspecified   2. Acute bronchitis, unspecified organism     Orders Placed This Encounter  Procedures  . POCT Influenza A/B  . POCT CBC    Meds ordered this encounter  Medications  . Ascorbic Acid (VITAMIN C) 100 MG tablet    Sig: Take 100 mg by mouth daily.  Marland Kitchen albuterol (PROVENTIL) (2.5 MG/3ML) 0.083% nebulizer solution 2.5 mg    Sig:   . Dextromethorphan-Guaifenesin (MUCINEX DM MAXIMUM STRENGTH) 60-1200 MG TB12    Sig: Take 1 tablet by mouth every 12 (twelve) hours.    Dispense:  14 each    Refill:  0  . azithromycin (ZITHROMAX) 250 MG tablet    Sig: Take 2 tabs PO x 1 dose, then 1 tab PO QD x 4 days    Dispense:  6 tablet    Refill:  0  . benzonatate (TESSALON) 100 MG capsule    Sig: Take 1-2 capsules (100-200 mg total) by mouth 3 (three) times daily as needed for cough.    Dispense:  40 capsule    Refill:  0    I personally performed the services described in this documentation, which was scribed in my presence. The recorded information has been reviewed and considered, and addended by me as needed.  Delman Cheadle, MD MPH

## 2015-07-01 NOTE — Patient Instructions (Signed)

## 2015-08-13 ENCOUNTER — Telehealth: Payer: Self-pay

## 2015-08-13 NOTE — Telephone Encounter (Signed)
Pt has been seeing doolittle and since he is retiring he recommended that he see [name]. This pt wants to make an appointment with her however with our rules I wasn't sure since [facility] did recommend sarah to them.  Please advise  [phone]

## 2015-08-20 ENCOUNTER — Telehealth: Payer: Self-pay

## 2015-08-20 NOTE — Telephone Encounter (Signed)
Patient will be out of ADDERALL 30 MG tablet [id number] on 08/20/2015 and wants to know if Dr. [name] can refill prescription with out him coming into the walk-in to be seen.   Patient has setup an appointment with Sarah on 10/04/2015 @ 3:15 pm.  Pharmacy:  [id number] - [place],  - [address]  Pt # [phone]

## 2015-08-20 NOTE — Telephone Encounter (Signed)
I will call patient this morning and talk with him about an appointment with [name].

## 2015-08-21 NOTE — Telephone Encounter (Signed)
On 2/15 i gave 3 rx for 2,3, and 4/15---is he missing the last one? Or needs 5/15 to last til f/u with [name]?? i can give 5,6,and 7/15 in may if that would help!

## 2015-08-23 NOTE — Telephone Encounter (Signed)
Lm with information

## 2015-08-29 ENCOUNTER — Ambulatory Visit (INDEPENDENT_AMBULATORY_CARE_PROVIDER_SITE_OTHER): Payer: BLUE CROSS/BLUE SHIELD | Admitting: Family Medicine

## 2015-08-29 VITALS — BP 136/84 | HR 71 | Temp 97.6°F | Resp 16 | Ht 72.5 in | Wt 176.0 lb

## 2015-08-29 DIAGNOSIS — S60132A Contusion of left middle finger with damage to nail, initial encounter: Secondary | ICD-10-CM

## 2015-08-29 DIAGNOSIS — R61 Generalized hyperhidrosis: Secondary | ICD-10-CM

## 2015-08-29 DIAGNOSIS — S60122A Contusion of left index finger with damage to nail, initial encounter: Secondary | ICD-10-CM | POA: Diagnosis not present

## 2015-08-29 DIAGNOSIS — S60142A Contusion of left ring finger with damage to nail, initial encounter: Secondary | ICD-10-CM

## 2015-08-29 DIAGNOSIS — L609 Nail disorder, unspecified: Secondary | ICD-10-CM | POA: Diagnosis not present

## 2015-08-29 DIAGNOSIS — L608 Other nail disorders: Secondary | ICD-10-CM | POA: Diagnosis not present

## 2015-08-29 DIAGNOSIS — S6010XA Contusion of unspecified finger with damage to nail, initial encounter: Secondary | ICD-10-CM

## 2015-08-29 LAB — POCT URINALYSIS DIP (MANUAL ENTRY)
BILIRUBIN UA: NEGATIVE
BILIRUBIN UA: NEGATIVE
Blood, UA: NEGATIVE
GLUCOSE UA: NEGATIVE
LEUKOCYTES UA: NEGATIVE
Nitrite, UA: NEGATIVE
Protein Ur, POC: NEGATIVE
Spec Grav, UA: 1.015
Urobilinogen, UA: 0.2
pH, UA: 6.5

## 2015-08-29 LAB — POC MICROSCOPIC URINALYSIS (UMFC): MUCUS RE: ABSENT

## 2015-08-29 NOTE — Patient Instructions (Addendum)
  I will notify you about your lab work and we will call you about your appointments with cardiology and infectious disease specialists. Please take your temperature a couple of times a day and in the evening and keep a log If you develop a persistent fever, chest pain or SOB, please come back in to the clinic or go to the emergency room   IF you received an x-ray today, you will receive an invoice from [facility]. Please contact [facility] at [phone] with questions or concerns regarding your invoice.   IF you received labwork today, you will receive an invoice from [employer]. Please contact [facility] at [phone] with questions or concerns regarding your invoice.   Our billing staff will not be able to assist you with questions regarding bills from these companies.  You will be contacted with the lab results as soon as they are available. The fastest way to get your results is to activate your My Chart account. Instructions are located on the last page of this paperwork. If you have not heard from [place] regarding the results in 2 weeks, please contact this office.

## 2015-08-29 NOTE — Progress Notes (Signed)
Subjective:    Patient ID: Phillip [name], male    DOB: [birth year], 60 y.o.   MRN: [id number]  HPI This is a pleasant 60 yo male who presents today with complaint of night sweats and discoloration of his nail beds. He was recently seen by Dr. Renda Rolls (derm) who asked him to come in and have a work up for endocarditis.  He has documentation of events with him today which are as follows- 07/26/15- Extensive dental work by Dr. Buelah Manis, extraction and bone graft. He was given clindamycin 300 mg for 3 days 08/02/15- Returned to dentist with severe pain and swelling, given local Novacain, methylprednisolone 4 mg and oxy/acetaminophen. Approximately 08/04/15- Sweats, flushed, insomnia, noticed blood under nails 4/5- prednisolone ended 4/11- permanent crown placed 4/19- follow up with Dr. Buelah Manis  He is having daily hot flashes, every hour through the night. Does not think he is having any fevers. Having intermittent sensation of chest tightness lasting unknown period of time. Not increasing in frequency.   Past Medical History  Diagnosis Date  . Depression   . Encephalitis   . Anxiety    No past surgical history on file. Family History  Problem Relation Age of Onset  . Hyperlipidemia Father   . Heart disease Maternal Grandfather   . Cancer Paternal Grandfather   . Mental retardation Mother    Social History  Substance Use Topics  . Smoking status: Former Smoker -- 1.50 packs/day for 25 years    Types: Cigarettes    Quit date: 11/22/2014  . Smokeless tobacco: Never Used     Comment: Encouraged to remain smoke free  . Alcohol Use: 0.0 oz/week    0 Standard drinks or equivalent per week     Comment: 8 drinks    Review of Systems Per HPI    Objective:   Physical Exam Physical Exam  Constitutional: Oriented to person, place, and time. He appears well-developed and well-nourished.  HENT:  Head: Normocephalic and atraumatic.  Eyes: Conjunctivae are normal.  Neck: Normal range of  motion. Neck supple.  Cardiovascular: Normal rate, regular rhythm and normal heart sounds. No murmur.   Pulmonary/Chest: Effort normal and breath sounds normal.  Musculoskeletal: Normal range of motion.  Neurological: Alert and oriented to person, place, and time.  Skin: Skin is warm and dry. Left 2,3,4 nails with dark bruising Psychiatric: Normal mood and affect. Behavior is normal. Judgment and thought content normal.  Vitals reviewed.   BP 136/84 mmHg  Pulse 71  Temp(Src) 97.6 F (36.4 C)  Resp 16  Ht 6' 0.5" (1.842 m)  Wt 176 lb (79.833 kg)  BMI 23.53 kg/m2 Wt Readings from Last 3 Encounters:  08/29/15 176 lb (79.833 kg)  07/01/15 182 lb (82.555 kg)  06/20/15 176 lb (79.833 kg)       Assessment & Plan:  Discussed with Dr. Tamala Julian 1. Night sweats - EKG 12-Lead - Rheumatoid factor - Sedimentation Rate - C-reactive protein - CBC - COMPLETE METABOLIC PANEL WITH GFR - POCT urinalysis dipstick - POCT Microscopic Urinalysis ([facility]) - Ambulatory referral to Infectious Disease - Ambulatory referral to Cardiology  2. Subungual hematoma, fingernail, initial encounter - Rheumatoid factor - Sedimentation Rate - C-reactive protein - CBC - COMPLETE METABOLIC PANEL WITH GFR - POCT urinalysis dipstick - POCT Microscopic Urinalysis ([facility]) - Ambulatory referral to Infectious Disease - Ambulatory referral to Cardiology  - discussed symptoms and plan for work up. RTC precautions reviewed.  Clarene Reamer, FNP-BC  [facility]  08/29/2015 6:11 PM

## 2015-08-30 ENCOUNTER — Encounter: Payer: Self-pay | Admitting: Family Medicine

## 2015-08-30 LAB — CBC
HCT: 39.8 % (ref 38.5–50.0)
Hemoglobin: 13.5 g/dL (ref 13.2–17.1)
MCH: 29.7 pg (ref 27.0–33.0)
MCHC: 33.9 g/dL (ref 32.0–36.0)
MCV: 87.5 fL (ref 80.0–100.0)
MPV: 8.9 fL (ref 7.5–12.5)
PLATELETS: 328 10*3/uL (ref 140–400)
RBC: 4.55 MIL/uL (ref 4.20–5.80)
RDW: 13.8 % (ref 11.0–15.0)
WBC: 5.8 10*3/uL (ref 3.8–10.8)

## 2015-08-30 LAB — C-REACTIVE PROTEIN: CRP: <0.5 mg/dL (ref <0.60)

## 2015-08-30 LAB — SEDIMENTATION RATE: SED RATE: 4 mm/h (ref 0–20)

## 2015-08-30 LAB — RHEUMATOID FACTOR

## 2015-09-02 ENCOUNTER — Emergency Department (HOSPITAL_COMMUNITY): Payer: BLUE CROSS/BLUE SHIELD

## 2015-09-02 ENCOUNTER — Emergency Department (HOSPITAL_COMMUNITY)
Admission: EM | Admit: 2015-09-02 | Discharge: 2015-09-03 | Disposition: A | Discharge status: Home / Self Care | Payer: BLUE CROSS/BLUE SHIELD | Attending: Emergency Medicine | Admitting: Emergency Medicine

## 2015-09-02 ENCOUNTER — Encounter (HOSPITAL_COMMUNITY): Payer: Self-pay | Admitting: *Deleted

## 2015-09-02 DIAGNOSIS — S60132A Contusion of left middle finger with damage to nail, initial encounter: Secondary | ICD-10-CM | POA: Diagnosis not present

## 2015-09-02 DIAGNOSIS — Z79899 Other long term (current) drug therapy: Secondary | ICD-10-CM | POA: Insufficient documentation

## 2015-09-02 DIAGNOSIS — S60112A Contusion of left thumb with damage to nail, initial encounter: Secondary | ICD-10-CM | POA: Diagnosis not present

## 2015-09-02 DIAGNOSIS — Y9289 Other specified places as the place of occurrence of the external cause: Secondary | ICD-10-CM | POA: Diagnosis not present

## 2015-09-02 DIAGNOSIS — Y9389 Activity, other specified: Secondary | ICD-10-CM | POA: Diagnosis not present

## 2015-09-02 DIAGNOSIS — S60042A Contusion of left ring finger without damage to nail, initial encounter: Secondary | ICD-10-CM | POA: Diagnosis not present

## 2015-09-02 DIAGNOSIS — R079 Chest pain, unspecified: Secondary | ICD-10-CM | POA: Diagnosis not present

## 2015-09-02 DIAGNOSIS — S60032A Contusion of left middle finger without damage to nail, initial encounter: Secondary | ICD-10-CM | POA: Diagnosis not present

## 2015-09-02 DIAGNOSIS — R0789 Other chest pain: Secondary | ICD-10-CM | POA: Diagnosis not present

## 2015-09-02 DIAGNOSIS — X58XXXA Exposure to other specified factors, initial encounter: Secondary | ICD-10-CM | POA: Diagnosis not present

## 2015-09-02 DIAGNOSIS — S60012A Contusion of left thumb without damage to nail, initial encounter: Secondary | ICD-10-CM | POA: Insufficient documentation

## 2015-09-02 DIAGNOSIS — F419 Anxiety disorder, unspecified: Secondary | ICD-10-CM | POA: Insufficient documentation

## 2015-09-02 DIAGNOSIS — Y998 Other external cause status: Secondary | ICD-10-CM | POA: Insufficient documentation

## 2015-09-02 DIAGNOSIS — Z87891 Personal history of nicotine dependence: Secondary | ICD-10-CM | POA: Diagnosis not present

## 2015-09-02 DIAGNOSIS — S60142A Contusion of left ring finger with damage to nail, initial encounter: Secondary | ICD-10-CM | POA: Diagnosis not present

## 2015-09-02 DIAGNOSIS — R0602 Shortness of breath: Secondary | ICD-10-CM | POA: Insufficient documentation

## 2015-09-02 DIAGNOSIS — Z880 Allergy status to penicillin: Secondary | ICD-10-CM | POA: Diagnosis not present

## 2015-09-02 DIAGNOSIS — R0600 Dyspnea, unspecified: Secondary | ICD-10-CM | POA: Diagnosis not present

## 2015-09-02 LAB — BASIC METABOLIC PANEL
Anion gap: 11 (ref 5–15)
BUN: 17 mg/dL (ref 6–20)
CALCIUM: 9.7 mg/dL (ref 8.9–10.3)
CO2: 26 mmol/L (ref 22–32)
CREATININE: 0.97 mg/dL (ref 0.61–1.24)
Chloride: 102 mmol/L (ref 101–111)
GFR calc non Af Amer: >60 mL/min (ref >60)
GLUCOSE: 129 mg/dL — AB (ref 65–99)
Potassium: 3.9 mmol/L (ref 3.5–5.1)
Sodium: 139 mmol/L (ref 135–145)

## 2015-09-02 LAB — CBC
HCT: 38.9 % — ABNORMAL LOW (ref 39.0–52.0)
Hemoglobin: 12.9 g/dL — ABNORMAL LOW (ref 13.0–17.0)
MCH: 29.3 pg (ref 26.0–34.0)
MCHC: 33.2 g/dL (ref 30.0–36.0)
MCV: 88.2 fL (ref 78.0–100.0)
PLATELETS: 282 10*3/uL (ref 150–400)
RBC: 4.41 MIL/uL (ref 4.22–5.81)
RDW: 13 % (ref 11.5–15.5)
WBC: 5.5 10*3/uL (ref 4.0–10.5)

## 2015-09-02 LAB — I-STAT TROPONIN, ED: TROPONIN I, POC: 0 ng/mL (ref 0.00–0.08)

## 2015-09-02 NOTE — ED Provider Notes (Signed)
CSN: [id number]     Arrival date & time 09/02/15  2158 History   First MD Initiated Contact with Patient 09/02/15 2249     Chief Complaint  Patient presents with  . Chest Pain   HPI Comments: 60 year old male with PMH of anxiety, depression who presents with chest tightness for the past day. He states he had a dental extraction and bone graft 5 weeks ago. He was placed on Clindamycin for 7 days at the time. 4 weeks ago he started having "hot flashes" where he becomes flushed all over as well as development of "splinter hemorrhages" on his ring, middle, and thumb on his left hand. Denies trauma. Yesterday he was working outside when he had an acute onset of chest tightness with SOB. He states it is on the left side of his chest, constant, does not radiate. Reports associated left arm numbness. Denies fever, chills, palpitations, leg swelling, cough, wheezing, nausea, vomiting, diarrhea. Denies cardiac hx or immediate family hx of cardiac disease. He is a former smoker. He has not taken any medicine for this. Never had chest pain before. He has an appt with Cardiology in May.  Patient is a 60 y.o. male presenting with chest pain.  Chest Pain Associated symptoms: shortness of breath   Associated symptoms: no abdominal pain, no cough, no fever, no nausea and not vomiting     Past Medical History  Diagnosis Date  . Depression   . Encephalitis   . Anxiety    History reviewed. No pertinent past surgical history. Family History  Problem Relation Age of Onset  . Hyperlipidemia Father   . Heart disease Maternal Grandfather   . Cancer Paternal Grandfather   . Mental retardation Mother    Social History  Substance Use Topics  . Smoking status: Former Smoker -- 1.50 packs/day for 25 years    Types: Cigarettes    Quit date: 11/22/2014  . Smokeless tobacco: Never Used     Comment: Encouraged to remain smoke free  . Alcohol Use: No     Comment: 8 drinks    Review of Systems  Constitutional:  Negative for fever and chills.  Respiratory: Positive for shortness of breath. Negative for cough and wheezing.   Cardiovascular: Positive for chest pain.  Gastrointestinal: Negative for nausea, vomiting, abdominal pain and diarrhea.  Skin:       Black spots on nail    Allergies  Penicillins  Home Medications   Prior to Admission medications   Medication Sig Start Date End Date Taking? Authorizing Provider  ADDERALL 30 MG tablet Take 1 tablet by mouth 3 (three) times daily. 06/20/15   Leandrew Koyanagi, MD  LORazepam (ATIVAN) 2 MG tablet Take 1 tablet (2 mg total) by mouth at bedtime. 06/20/15   Leandrew Koyanagi, MD  Omega-3 Fatty Acids (FISH OIL PO) Take 1 capsule by mouth daily.    Historical Provider, MD  OVER THE COUNTER MEDICATION Brain/Memory taking 2 a day    Historical Provider, MD  sildenafil (VIAGRA) 100 MG tablet TAKE 1/2 TO 1 TABLET DAILY AS NEEDED FOR ERECTILE DYSFUNCTION. Patient not taking: Reported on 08/29/2015 01/05/15   Leandrew Koyanagi, MD   BP 148/89 mmHg  Pulse 61  Temp(Src) 97.8 F (36.6 C) (Oral)  Resp 12  SpO2 99%   Physical Exam  Constitutional: He is oriented to person, place, and time. He appears well-developed and well-nourished. No distress.  HENT:  Head: Normocephalic and atraumatic.  Eyes: Conjunctivae are  normal. Pupils are equal, round, and reactive to light. Right eye exhibits no discharge. Left eye exhibits no discharge. No scleral icterus.  Neck: Normal range of motion.  Cardiovascular: Normal rate and regular rhythm.  Exam reveals no gallop and no friction rub.   No murmur heard. No murmur  Pulmonary/Chest: Effort normal and breath sounds normal. No respiratory distress. He has no wheezes. He has no rales. He exhibits tenderness.  Reproducible chest tenderness of left side of chest wall  Abdominal: Soft. There is no tenderness.  Musculoskeletal:  Subungual hematoma on left ring, middle, and thumb. No Ivin Booty, Osler nodes  Neurological:  He is alert and oriented to person, place, and time.  Skin: Skin is warm and dry.  Psychiatric: He has a normal mood and affect.      ED Course  Procedures (including critical care time) Labs Review Labs Reviewed  BASIC METABOLIC PANEL - Abnormal; Notable for the following:    Glucose, Bld 129 (*)    All other components within normal limits  CBC - Abnormal; Notable for the following:    Hemoglobin 12.9 (*)    HCT 38.9 (*)    All other components within normal limits  CULTURE, BLOOD (ROUTINE X 2)  CULTURE, BLOOD (ROUTINE X 2)  CULTURE, BLOOD (SINGLE)  CULTURE, BLOOD (ROUTINE X 2) W REFLEX TO ID PANEL  I-STAT TROPOININ, ED    Imaging Review Dg Chest 2 View  09/02/2015  CLINICAL DATA:  Chest pain and dyspnea, onset tonight. EXAM: CHEST  2 VIEW COMPARISON:  06/22/2014 FINDINGS: The lungs are clear. The pulmonary vasculature is normal. Heart size is normal. Hilar and mediastinal contours are unremarkable. There is no pleural effusion. IMPRESSION: No active cardiopulmonary disease. Electronically Signed   By: Andreas Newport M.D.   On: 09/02/2015 23:21   I have personally reviewed and evaluated these images and lab results as part of my medical decision-making.   EKG Interpretation   Date/Time:  Sunday September 02 2015 22:03:50 EDT Ventricular Rate:  74 PR Interval:  166 QRS Duration: 92 QT Interval:  356 QTC Calculation: 395 R Axis:   78 Text Interpretation:  Normal sinus rhythm Incomplete right bundle branch  block Minimal voltage criteria for LVH, may be normal variant Borderline  ECG Confirmed by Hazle Coca [phone]) on 09/02/2015 10:48:44 PM      MDM   Final diagnoses:  Chest pain, unspecified chest pain type   60 year old Male with acute onset of chest tightness for 1 day. He has been constant for the past day. He's been worked up extensively. Rh factor, sed rate, CRP are normal.   Labs are unremarkable. Troponin is negative, EKG is reassuring. Chest x-ray is  negative. Unlikely this is endocarditis. Patient completed a seven-day course of clindamycin after his dental procedure. He is afebrile. The spots on his nails are subungual hematomas, not splinter hemorrhages. We'll obtain 3 sets of blood cultures. Patient is refusing GI cocktail.   Shared visit with Dr. Randal Buba. Patient is NAD, non-toxic, with stable VS. Patient is informed of clinical course, understands medical decision making process, and agrees with plan. Opportunity for questions provided and all questions answered. Patient advised to follow up with his PCP, Infectious disease, and Cardiology.     Recardo Evangelist, PA-C 09/03/15 1303  Veatrice Kells, MD 09/03/15 2334  Veatrice Kells, MD 09/04/15 [phone]

## 2015-09-02 NOTE — ED Notes (Signed)
Patient presents stating he started with hot flashes and then his chest started hurting

## 2015-09-03 MED ORDER — GI COCKTAIL ~~LOC~~
30.0000 mL | Freq: Once | ORAL | Status: DC
  Filled 2015-09-03: qty 30

## 2015-09-04 ENCOUNTER — Encounter (HOSPITAL_COMMUNITY): Payer: Self-pay | Admitting: Adult Health

## 2015-09-04 ENCOUNTER — Inpatient Hospital Stay (HOSPITAL_COMMUNITY)
Admission: EM | Admit: 2015-09-04 | Discharge: 2015-09-07 | DRG: 872 | Disposition: A | Discharge status: Home / Self Care | Payer: BLUE CROSS/BLUE SHIELD | Attending: Family Medicine | Admitting: Family Medicine

## 2015-09-04 ENCOUNTER — Observation Stay (HOSPITAL_COMMUNITY): Payer: BLUE CROSS/BLUE SHIELD

## 2015-09-04 ENCOUNTER — Telehealth (HOSPITAL_COMMUNITY): Payer: Self-pay

## 2015-09-04 DIAGNOSIS — I340 Nonrheumatic mitral (valve) insufficiency: Secondary | ICD-10-CM | POA: Diagnosis present

## 2015-09-04 DIAGNOSIS — I341 Nonrheumatic mitral (valve) prolapse: Secondary | ICD-10-CM | POA: Diagnosis not present

## 2015-09-04 DIAGNOSIS — K029 Dental caries, unspecified: Secondary | ICD-10-CM | POA: Diagnosis present

## 2015-09-04 DIAGNOSIS — R7881 Bacteremia: Secondary | ICD-10-CM | POA: Diagnosis present

## 2015-09-04 DIAGNOSIS — L0291 Cutaneous abscess, unspecified: Secondary | ICD-10-CM | POA: Diagnosis not present

## 2015-09-04 DIAGNOSIS — R0602 Shortness of breath: Secondary | ICD-10-CM | POA: Diagnosis not present

## 2015-09-04 DIAGNOSIS — F329 Major depressive disorder, single episode, unspecified: Secondary | ICD-10-CM | POA: Diagnosis not present

## 2015-09-04 DIAGNOSIS — Z79899 Other long term (current) drug therapy: Secondary | ICD-10-CM | POA: Diagnosis not present

## 2015-09-04 DIAGNOSIS — Z9889 Other specified postprocedural states: Secondary | ICD-10-CM | POA: Diagnosis not present

## 2015-09-04 DIAGNOSIS — F419 Anxiety disorder, unspecified: Secondary | ICD-10-CM | POA: Insufficient documentation

## 2015-09-04 DIAGNOSIS — I7300 Raynaud's syndrome without gangrene: Secondary | ICD-10-CM | POA: Diagnosis not present

## 2015-09-04 DIAGNOSIS — R0789 Other chest pain: Secondary | ICD-10-CM | POA: Diagnosis not present

## 2015-09-04 DIAGNOSIS — R509 Fever, unspecified: Secondary | ICD-10-CM | POA: Diagnosis not present

## 2015-09-04 DIAGNOSIS — F988 Other specified behavioral and emotional disorders with onset usually occurring in childhood and adolescence: Secondary | ICD-10-CM | POA: Diagnosis not present

## 2015-09-04 DIAGNOSIS — Z87891 Personal history of nicotine dependence: Secondary | ICD-10-CM | POA: Diagnosis not present

## 2015-09-04 DIAGNOSIS — Z8661 Personal history of infections of the central nervous system: Secondary | ICD-10-CM

## 2015-09-04 DIAGNOSIS — R6884 Jaw pain: Secondary | ICD-10-CM | POA: Diagnosis not present

## 2015-09-04 LAB — BASIC METABOLIC PANEL
Anion gap: 9 (ref 5–15)
BUN: 18 mg/dL (ref 6–20)
CHLORIDE: 105 mmol/L (ref 101–111)
CO2: 27 mmol/L (ref 22–32)
CREATININE: 1.09 mg/dL (ref 0.61–1.24)
Calcium: 9.6 mg/dL (ref 8.9–10.3)
GFR calc Af Amer: >60 mL/min (ref >60)
GFR calc non Af Amer: >60 mL/min (ref >60)
Glucose, Bld: 117 mg/dL — ABNORMAL HIGH (ref 65–99)
Potassium: 4.5 mmol/L (ref 3.5–5.1)
SODIUM: 141 mmol/L (ref 135–145)

## 2015-09-04 LAB — CBC WITH DIFFERENTIAL/PLATELET
Basophils Absolute: 0 10*3/uL (ref 0.0–0.1)
Basophils Relative: 1 %
EOS ABS: 0.2 10*3/uL (ref 0.0–0.7)
EOS PCT: 3 %
HCT: 39.4 % (ref 39.0–52.0)
HEMOGLOBIN: 13.4 g/dL (ref 13.0–17.0)
LYMPHS ABS: 3 10*3/uL (ref 0.7–4.0)
Lymphocytes Relative: 46 %
MCH: 29.5 pg (ref 26.0–34.0)
MCHC: 34 g/dL (ref 30.0–36.0)
MCV: 86.8 fL (ref 78.0–100.0)
MONO ABS: 0.5 10*3/uL (ref 0.1–1.0)
MONOS PCT: 8 %
Neutro Abs: 2.6 10*3/uL (ref 1.7–7.7)
Neutrophils Relative %: 42 %
PLATELETS: 289 10*3/uL (ref 150–400)
RBC: 4.54 MIL/uL (ref 4.22–5.81)
RDW: 13 % (ref 11.5–15.5)
WBC: 6.3 10*3/uL (ref 4.0–10.5)

## 2015-09-04 LAB — I-STAT TROPONIN, ED: TROPONIN I, POC: 0 ng/mL (ref 0.00–0.08)

## 2015-09-04 LAB — CBC
HCT: 37.9 % — ABNORMAL LOW (ref 39.0–52.0)
Hemoglobin: 12.6 g/dL — ABNORMAL LOW (ref 13.0–17.0)
MCH: 29.7 pg (ref 26.0–34.0)
MCHC: 33.2 g/dL (ref 30.0–36.0)
MCV: 89.4 fL (ref 78.0–100.0)
Platelets: 244 10*3/uL (ref 150–400)
RBC: 4.24 MIL/uL (ref 4.22–5.81)
RDW: 13.1 % (ref 11.5–15.5)
WBC: 4.7 10*3/uL (ref 4.0–10.5)

## 2015-09-04 LAB — TROPONIN I
Troponin I: <0.03 ng/mL (ref <0.031)
Troponin I: <0.03 ng/mL (ref <0.031)

## 2015-09-04 LAB — APTT: aPTT: 29 seconds (ref 24–37)

## 2015-09-04 LAB — PROTIME-INR
INR: 1.02 (ref 0.00–1.49)
PROTHROMBIN TIME: 13.6 s (ref 11.6–15.2)

## 2015-09-04 LAB — TSH: TSH: 2.666 u[IU]/mL (ref 0.350–4.500)

## 2015-09-04 MED ORDER — VANCOMYCIN HCL 10 G IV SOLR
1500.0000 mg | Freq: Two times a day (BID) | INTRAVENOUS | Status: DC
  Filled 2015-09-04: qty 1500

## 2015-09-04 MED ORDER — VANCOMYCIN HCL 10 G IV SOLR
1250.0000 mg | Freq: Two times a day (BID) | INTRAVENOUS | Status: DC
  Filled 2015-09-04 (×2): qty 1250

## 2015-09-04 MED ORDER — LORAZEPAM 1 MG PO TABS
1.0000 mg | ORAL_TABLET | Freq: Once | ORAL | Status: AC
  Administered 2015-09-04: 1 mg via ORAL
  Filled 2015-09-04: qty 1

## 2015-09-04 MED ORDER — SODIUM CHLORIDE 0.9% FLUSH
3.0000 mL | Freq: Two times a day (BID) | INTRAVENOUS | Status: DC
  Administered 2015-09-04 – 2015-09-07 (×5): 3 mL via INTRAVENOUS

## 2015-09-04 MED ORDER — VANCOMYCIN HCL IN DEXTROSE 1-5 GM/200ML-% IV SOLN
1000.0000 mg | Freq: Once | INTRAVENOUS | Status: AC
Start: 2015-09-04 — End: 2015-09-04
  Administered 2015-09-04: 1000 mg via INTRAVENOUS
  Filled 2015-09-04: qty 200

## 2015-09-04 MED ORDER — AMPHETAMINE-DEXTROAMPHETAMINE 10 MG PO TABS
30.0000 mg | ORAL_TABLET | Freq: Three times a day (TID) | ORAL | Status: DC
  Administered 2015-09-04 – 2015-09-07 (×8): 30 mg via ORAL
  Filled 2015-09-04 (×11): qty 3

## 2015-09-04 MED ORDER — SODIUM CHLORIDE 0.9 % IV SOLN: 250.0000 mL | INTRAVENOUS | Status: DC | PRN

## 2015-09-04 MED ORDER — ENOXAPARIN SODIUM 40 MG/0.4ML ~~LOC~~ SOLN
40.0000 mg | SUBCUTANEOUS | Status: DC
  Filled 2015-09-04: qty 0.4

## 2015-09-04 MED ORDER — VANCOMYCIN HCL 10 G IV SOLR
1500.0000 mg | Freq: Two times a day (BID) | INTRAVENOUS | Status: DC
  Administered 2015-09-04: 1500 mg via INTRAVENOUS
  Filled 2015-09-04 (×3): qty 1500

## 2015-09-04 MED ORDER — LORAZEPAM 1 MG PO TABS
2.0000 mg | ORAL_TABLET | Freq: Every evening | ORAL | Status: DC | PRN
  Administered 2015-09-04: 2 mg via ORAL
  Filled 2015-09-04: qty 2

## 2015-09-04 MED ORDER — SODIUM CHLORIDE 0.9% FLUSH: 3.0000 mL | INTRAVENOUS | Status: DC | PRN

## 2015-09-04 MED ORDER — VANCOMYCIN HCL 10 G IV SOLR
1500.0000 mg | Freq: Two times a day (BID) | INTRAVENOUS | Status: DC
  Filled 2015-09-04 (×2): qty 1500

## 2015-09-04 NOTE — Progress Notes (Signed)
NURSING PROGRESS NOTE  Phillip [name] [id number] Admission Data: 09/04/2015 6:43 AM Attending Provider: Etta Quill, DO [id number], Linton Ham, MD Code Status: Full  Allergies:  Penicillins Past Medical History:   has a past medical history of Depression; Encephalitis; and Anxiety. Past Surgical History:   has no past surgical history on file. Social History:   reports that he quit smoking about 9 months ago. His smoking use included Cigarettes. He has a 37.5 pack-year smoking history. He has never used smokeless tobacco. He reports that he does not drink alcohol or use illicit drugs.  Phillip [name] is a 60 y.o. male patient admitted from ED:   Last Documented Vital Signs: Blood pressure 167/79, pulse 60, temperature 97.7 F (36.5 C), temperature source Oral, resp. rate 12, height 6\' 1"  (1.854 m), weight 77.3 kg (170 lb 6.7 oz), SpO2 100 %.  Cardiac Monitoring: N/a  IV Fluids:  IV in place, occlusive dsg intact without redness, IV cath forearm left, condition patent and no redness none.   Skin: WDL  Patient/Family orientated to room. Information packet given to patient/family. Admission inpatient armband information verified with patient/family to include name and date of birth and placed on patient arm. Side rails up x 2, fall assessment and education completed with patient/family. Patient/family able to verbalize understanding of risk associated with falls and verbalized understanding to call for assistance before getting out of bed. Call light within reach. Patient/family able to voice and demonstrate understanding of unit orientation instructions.    Will continue to evaluate and treat per MD orders.   Amaryllis Dyke, RN

## 2015-09-04 NOTE — Progress Notes (Signed)
Pharmacy Antibiotic Note Phillip [name] is a 60 y.o. male called back to hospital on 09/04/2015 for positive blood cx in setting of recent dental procedure and visit to ER on 4/30 with chest tightness and SOB. Vancomycin 1000 mg given on 5/2 at 0500; pharmacy consulted to assist with further dosing vancomycin.   Plan: 1. Vancomycin 1500 IV every 12 hours.  Goal trough 15-20 mcg/mL.  2. SCr Q 72H while on vancomycin 3. Abx guidance per ID 4. Following along with you daily   Height: 6\' 1"  (185.4 cm) Weight: 170 lb 6.7 oz (77.3 kg) IBW/kg (Calculated) : 79.9  Temp (24hrs), Avg:97.9 F (36.6 C), Min:97.7 F (36.5 C), Max:98.1 F (36.7 C)   Recent Labs Lab 08/29/15 1811 09/02/15 2228 09/04/15 0140 09/04/15 0944  WBC 5.8 5.5 6.3 4.7  CREATININE  --  0.97 1.09  --     Estimated Creatinine Clearance: 80.8 mL/min (by C-G formula based on Cr of 1.09).    Allergies  Allergen Reactions  . Penicillins Hives    Antimicrobials this admission: 5/2 Vancomycin >>   Dose adjustments this admission: n/a  Microbiology results: 5/2 BCx: px 5/1 BCx: GPC in 1/2   Thank you for allowing pharmacy to be a part of this patient's care.  Vincenza Hews, PharmD, BCPS 09/04/2015, 6:03 PM Pager: [phone]

## 2015-09-04 NOTE — Telephone Encounter (Signed)
Call from lab w/PRELIM. bld cx results of 1 aerobic bottle gram (+) cocci in clusters.  (3 sets drawn)   09/04/2015 @ 0023 Chart reviewed by Dr Dina Rich "Call patient to come back for reevaluation immediately; to ED" 09/04/2015 @ 0028  Pt notified of prelim. Results and MD's recommendation to return.  Pt states will return tonight.

## 2015-09-04 NOTE — H&P (Signed)
[facility] Admission History and Physical Service Pager: [phone]  Patient name: Phillip [name] Medical record number: [id number] Date of birth: 25-Jan-1956 Age: 60 y.o. Gender: male  Primary Care Provider: Leandrew Koyanagi, MD Consultants: ID Code Status: Full   Chief Complaint: bacteremia   Assessment and Plan: Phillip [name] is a 60 y.o. male presenting with bacteremia. PMH is significant for anxiety and encephalomeningitis.  Bacteremia: 1/2 blood cultures have grown gram + cocci in clusters. He has been given one dose of vancomycin early this morning. He had a dental procedure about 4 weeks ago and was covered with clindamycin. Denies any prior cardiac surgeries or any joint replacements.  All of his symptoms have originated since that time.  Having bruising occurring under his fingernails on his left hand but appear to be subungual hematomas instead of splinter hemorrhages. Not having any other skin changes on his feet or right hand. Currently afebrile with no leukocytosis and is well appearing. Possible for containment.  - admit to observation, Dr. Nori Riis attending  - ECHO pending  - PT/INR, APPT - ID consult  - holding vancomycin for now.  - 5/1 and 5/2 blood cultures pending   Chest tightness: reports that he had some episodes of chest tightness that have resolved. istat troponin has been normal and EKG has been unchanged. No prior cardiac history. Heart score of 1.  - cycle troponin's   Other complaints: having fingers turning white, hot flashes, subungual hematomes and having red streaking on his upper thighs. These have all started since his dental procedure. Autoimmune work up has been unrevealing up to this point. He has also had several tick bites during this year. EKG has been unchanged from previous. Possibly a reaction to the systemic steroids that he received but odd presentation.  - TSH - if clinical concern for Lyme or RMSF could consider  doxycyline.   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 Thought that his infection was from a mosquito.  - continue adderall.   FEN/GI: saline lock, heart healthy diet.  Prophylaxis: lovenox   Disposition: admitted to [facility] for work up for bacteriemia.   History of Present Illness:  Phillip [name] is a 60 y.o. male presenting with bacteremia. He had a dental procedure performed about 4 weeks ago. During this time he had an extraction and bone graft. He was given clindamycin for three days after this. He had severe pain and was given methylprednisolone a week after the procedure. About 4 weeks he started having hot flashes. The hot flashes occur every 45 minutes and last for 30 seconds. He hasn't had any change in his diet or new exercise. He drinks 2-3 cups of coffee per day. Denies any stress or worsening anxiety. The hot flashes seem to becoming more frequent. He has also had his fingers turning white. The white changes start at his PIP joint and extend distally. They feel cold when this occurs and doesn't relate it to a stress or changes in the environment. Denies any family history of any autoimmune disease. Recent lab work up with CRP, Sed rate, RF were all normal. He also notes having numbness in his right hand which is new. He also has intermittent red streaks in his upper bilateral thighs.  He presented to the ED on 5/1 for chest pain. He was also presenting with bruising under his fingernails on his left hand. There was concern for  endocarditis and blood cultures were taken. 1/2 of the blood cultures grew gram positive cocci in clusters.  He was called and informed to come back to the ED on 5/2.   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 He has had 8 tick bites since the beginning of this year. His most recent tick bite occurred on his right medial proximal gastrocnemius. He has a history of encephalomeningitis.  This occurred in 1996 and has some short term memory loss and cognitive changes since them. They believe he contracted this from a mosquito bite. He has been seen at the neuro/psych clinic at [facility], clinic at [facility] and a clinic in [place] for his work up. He takes the adderall to help with his cognition.   Review Of Systems: Per HPI with the following additions: See HPI  Otherwise the remainder of the systems were negative.  Patient Active Problem List   Diagnosis Date Noted  . Positive blood culture 09/04/2015  . Cognitive impairment 01/31/2013  . 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 ADD (attention deficit disorder) 03/10/2012  . Depression with anxiety 03/10/2012    Past Medical History: Past Medical History  Diagnosis Date  . Depression   . Encephalitis   . Anxiety     Past Surgical History: History reviewed. No pertinent past surgical history.  Social History: Social History  Substance Use Topics  . Smoking status: Former Smoker -- 1.50 packs/day for 25 years    Types: Cigarettes    Quit date: 11/22/2014  . Smokeless tobacco: Never Used     Comment: Encouraged to remain smoke free  . Alcohol Use: No     Comment: 8 drinks   Additional social history: none Please also refer to relevant sections of EMR.  Family History: Family History  Problem Relation Age of Onset  . Hyperlipidemia Father   . Heart disease Maternal Grandfather   . Cancer Paternal Grandfather   . Mental retardation Mother     Allergies and Medications: Allergies  Allergen Reactions  . Penicillins Hives   No current facility-administered medications on file prior to encounter.   Current Outpatient Prescriptions on File Prior to Encounter  Medication Sig Dispense Refill  . ADDERALL 30 MG tablet Take 1 tablet by mouth 3 (three) times daily. 90 tablet 0  . LORazepam (ATIVAN) 2 MG tablet Take 1 tablet (2 mg total) by mouth at bedtime. 30 tablet 5  . Omega-3 Fatty Acids (FISH OIL PO) Take 1  capsule by mouth daily.    Marland Kitchen OVER THE COUNTER MEDICATION Take 1 tablet by mouth 2 (two) times daily. Brain/Memory taking 2 a day      Objective: BP 167/79 mmHg  Pulse 60  Temp(Src) 97.7 F (36.5 C) (Oral)  Resp 12  Ht 6\' 1"  (1.854 m)  Wt 170 lb 6.7 oz (77.3 kg)  BMI 22.49 kg/m2  SpO2 100% Exam: General: no distress  Eyes: clear conjunctiva, EOMI  ENTM: MMM,  Neck: supple  Cardiovascular: S1S2, RRR, no murmurs, rubs or gallops  Respiratory: CTAB bilaterally, no wheezes or crackles  Abdomen: soft, NTND, +BS, no rebound or guarding,  MSK: moves all extremities freely,  Skin: subungual hematomas noted on his 2nd, 3rd, 4th digits on left hand, circular macular erythematous lesion on his medial proximal gastrocnemius, no erythema observed on his thighs, no other rashes notes on his right hand or feet,  Neuro: no gross deficits  Psych: alert and oriented.   Labs and Imaging: CBC BMET  Recent Labs Lab 09/04/15 0140  WBC 6.3  HGB 13.4  HCT 39.4  PLT 289    Recent Labs Lab 09/04/15 0140  NA 141  K 4.5  CL 105  CO2 27  BUN 18  CREATININE 1.09  GLUCOSE 117*  CALCIUM 9.6      Rosemarie Ax, MD 09/04/2015, 8:21 AM PGY-3, [place] [facility] pager: [phone], text pages welcome

## 2015-09-04 NOTE — Progress Notes (Signed)
Received report from Rod Holler, [place] in Ed for transfer of pt to [phone]

## 2015-09-04 NOTE — ED Provider Notes (Signed)
CSN: [id number]     Arrival date & time 09/04/15  0125 History  By signing my name below, I, Altamease Oiler, attest that this documentation has been prepared under the direction and in the presence of Merryl Hacker, MD. Electronically Signed: Altamease Oiler, ED Scribe. 09/04/2015. 3:58 AM   Chief Complaint  Patient presents with  . Abnormal Lab    The history is provided by the patient. No language interpreter was used.   Phillip [name] is a 60 y.o. male who presents to the Emergency Department for evaluation after an abnormal lab result. The pt was seen last night for chest tightness and SOB and called back to the ED for a positive blood culture. He reports that he continues to feel fatigued and have hot flashes.  He also has areas of discoloration at his fingernails on the left hand that have been present since March of this year after dental surgery. Pt denies chest pain.  Patient was seen and evaluated yesterday. Was seen by derm and concern for splinter hemorrhages.  His workup yesterday was reassuring. See full note for details.   Past Medical History  Diagnosis Date  . Depression   . Encephalitis   . Anxiety    History reviewed. No pertinent past surgical history. Family History  Problem Relation Age of Onset  . Hyperlipidemia Father   . Heart disease Maternal Grandfather   . Cancer Paternal Grandfather   . Mental retardation Mother    Social History  Substance Use Topics  . Smoking status: Former Smoker -- 1.50 packs/day for 25 years    Types: Cigarettes    Quit date: 11/22/2014  . Smokeless tobacco: Never Used     Comment: Encouraged to remain smoke free  . Alcohol Use: No     Comment: 8 drinks    Review of Systems  Constitutional: Positive for fatigue.  Respiratory: Positive for chest tightness and shortness of breath.   Cardiovascular: Negative for chest pain.  Skin:       Nailbed changes  All other systems reviewed and are negative.  Allergies   Penicillins  Home Medications   Prior to Admission medications   Medication Sig Start Date End Date Taking? Authorizing Provider  ADDERALL 30 MG tablet Take 1 tablet by mouth 3 (three) times daily. 06/20/15  Yes Leandrew Koyanagi, MD  LORazepam (ATIVAN) 2 MG tablet Take 1 tablet (2 mg total) by mouth at bedtime. 06/20/15  Yes Leandrew Koyanagi, MD  Omega-3 Fatty Acids (FISH OIL PO) Take 1 capsule by mouth daily.   Yes Historical Provider, MD  OVER THE COUNTER MEDICATION Take 1 tablet by mouth 2 (two) times daily. Brain/Memory taking 2 a day   Yes Historical Provider, MD   BP 140/93 mmHg  Pulse 67  Temp(Src) 98.1 F (36.7 C) (Oral)  Resp 16  Wt 175 lb (79.379 kg)  SpO2 100% Physical Exam  Constitutional: He is oriented to person, place, and time. He appears well-developed and well-nourished. No distress.  HENT:  Head: Normocephalic and atraumatic.  Cardiovascular: Normal rate, regular rhythm and normal heart sounds.   No murmur heard. Pulmonary/Chest: Effort normal and breath sounds normal. No respiratory distress. He has no wheezes. He exhibits no tenderness.  Abdominal: Soft. Bowel sounds are normal. There is no tenderness. There is no rebound.  Musculoskeletal: He exhibits no edema.  Neurological: He is alert and oriented to person, place, and time.  Skin: Skin is warm and dry.  Subungual hematomas  noted under first third and fourth digits  Psychiatric: He has a normal mood and affect.  Nursing note and vitals reviewed.   ED Course  Procedures (including critical care time) DIAGNOSTIC STUDIES: Oxygen Saturation is 100% on RA,  normal by my interpretation.    COORDINATION OF CARE: 3:53 AM Discussed treatment plan which includes lab work and admission to the hospital with pt at bedside and pt agreed to plan.  Labs Review Labs Reviewed  BASIC METABOLIC PANEL - Abnormal; Notable for the following:    Glucose, Bld 117 (*)    All other components within normal limits   CULTURE, BLOOD (ROUTINE X 2)  CULTURE, BLOOD (ROUTINE X 2)  CBC WITH DIFFERENTIAL/PLATELET  Randolm Idol, ED    Imaging Review Dg Chest 2 View  09/02/2015  CLINICAL DATA:  Chest pain and dyspnea, onset tonight. EXAM: CHEST  2 VIEW COMPARISON:  06/22/2014 FINDINGS: The lungs are clear. The pulmonary vasculature is normal. Heart size is normal. Hilar and mediastinal contours are unremarkable. There is no pleural effusion. IMPRESSION: No active cardiopulmonary disease. Electronically Signed   By: Andreas Newport M.D.   On: 09/02/2015 23:21   I have personally reviewed and evaluated these lab results as part of my medical decision-making.   EKG Interpretation   Date/Time:  Tuesday Sep 04 2015 04:45:17 EDT Ventricular Rate:  60 PR Interval:  192 QRS Duration: 105 QT Interval:  417 QTC Calculation: 417 R Axis:   59 Text Interpretation:  Sinus rhythm RSR' in V1 or V2, probably normal  variant Confirmed by Zaliyah Meikle  MD, Makenna Macaluso ([id number]) on 09/04/2015 7:34:37 AM      MDM   Final diagnoses:  Positive blood culture    Patient presents after being called back for positive blood culture. He is well-appearing. Continues to be afebrile. No significant changes since yesterday. Blood culture with gram-positive cocci in clusters. Repeat blood cultures obtained. Patient given vancomycin. Patient will need formal echocardiogram to evaluate for endocarditis given concerns. Discussed with the admitting hospitalist.  I personally performed the services described in this documentation, which was scribed in my presence. The recorded information has been reviewed and is accurate.    Merryl Hacker, MD 09/04/15 [phone]

## 2015-09-04 NOTE — Care Management Note (Addendum)
Case Management Note  Patient Details  Name: Phillip [name] MRN: [id number] Date of Birth: 1955-09-07  Subjective/Objective:           Phillip [name] is a 60 y.o. male called back to hospital on 09/04/2015 for positive blood cx in setting of recent dental procedure and visit to ER on 4/30 with chest tightness and SOB.Admitted with bacteremia. Lives alone. Independent with ADL's. No DME usage. PCP: Vernia Buff.  Action/Plan:  ID following. Return to home when medically stable. CM to follow-up with disposition needs.  Expected Discharge Date:                  Expected Discharge Plan:  Home/Self Care  In-House Referral:     Discharge planning Services  CM Consult  Post Acute Care Choice:    Choice offered to:     DME Arranged:    DME Agency:     HH Arranged:    HH Agency:     Status of Service:  In process, will continue to follow  Medicare Important Message Given:    Date Medicare IM Given:    Medicare IM give by:    Date Additional Medicare IM Given:    Additional Medicare Important Message give by:     If discussed at [place] of Stay Meetings, dates discussed:    Additional Comments:  Sharin Mons, [place] [phone] 09/04/2015, 8:42 PM

## 2015-09-04 NOTE — ED Notes (Signed)
PT was called to come back to ER due to abnormal blood cultures-gram positive cocci in pairs. Seen here last night.

## 2015-09-04 NOTE — Consult Note (Signed)
[facility]  Date of Admission:  09/04/2015  Date of Consult:  09/04/2015  Reason for Consult: Bacteremia Referring Physician: Nori Riis  Impression/Recommendation Bacteremia  Await ID and sensi Await TEE Continue vanco for now.  Check panorex Consider CT chest, neck to eval for lemiere's if TEE negative?  Thank you so much for this interesting consult,   [name] (pager) [phone] [email or web address]  Phillip [name] is an 60 y.o. male.  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 He received clinda for 3 days after this procedure. He was seen again 1 week later with facial swelling and pain. He was given prednisone and oxycodone. He still has some mild discomfort.  He has since been having hot flashes. He has been having numbness in his hands as well as whiteness in his fingers.  He has been afebrile in hospital. His WBC is normal.  He came to ED on 4-30 with chest tightness and DOE while doing yard work. He was noted to have bruises under his finger nails on L hand. He had BCx drawn due to concern for IE. His BCx are now 1/2 GPC. He was called this AM to come back to the hospital.  He has since been started on vanco.   Past Medical History  Diagnosis Date  . Depression   . Encephalitis   . Anxiety     History reviewed. No pertinent past surgical history.   Allergies  Allergen Reactions  . Penicillins Hives    Medications:  Scheduled: . amphetamine-dextroamphetamine  30 mg Oral TID  . enoxaparin (LOVENOX) injection  40 mg Subcutaneous Q24H  . sodium chloride flush  3 mL Intravenous Q12H    Abtx:  Anti-infectives    Start     Dose/Rate Route Frequency Ordered Stop   09/04/15 0415  vancomycin (VANCOCIN) IVPB 1000 mg/200 mL premix     1,000 mg 200 mL/hr over 60 Minutes Intravenous  Once 09/04/15 0408 09/04/15 0552      Total days of  antibiotics: 0 vanco          Social History:  reports that he quit smoking about 9 months ago. His smoking use included Cigarettes. He has a 37.5 pack-year smoking history. He has never used smokeless tobacco. He reports that he does not drink alcohol or use illicit drugs.  Family History  Problem Relation Age of Onset  . Hyperlipidemia Father   . Heart disease Maternal Grandfather   . Cancer Paternal Grandfather   . Mental retardation Mother     General ROS: prev PEN rash at 88-8 yo, see HPI. 12 point ros o/w normal   Blood pressure 167/79, pulse 60, temperature 97.7 F (36.5 C), temperature source Oral, resp. rate 12, height _0  (1.854 m), weight 77.3 kg (170 lb 6.7 oz), SpO2 100 %. General appearance: alert, cooperative and no distress Eyes: negative findings: conjunctivae and sclerae normal and pupils equal, round, reactive to light and accomodation Throat: normal findings: oropharynx pink & moist without lesions or evidence of thrush and abnormal findings: wound on R mandible/gumline. there is no d/c. there is minimal tenderness.  Neck: no adenopathy, supple, symmetrical, trachea midline and no cordis Lungs: clear to auscultation bilaterally Heart: regular rate and rhythm Abdomen: normal findings: bowel sounds normal and soft, non-tender Extremities: edema none and subungual hematomas on L fingers 2/3. none on feet.  Results for orders placed or performed during the hospital encounter of 09/04/15 (from the past 48 hour(s))  CBC with Differential     Status: None   Collection Time: 09/04/15  1:40 AM  Result Value Ref Range   WBC 6.3 4.0 - 10.5 K/uL   RBC 4.54 4.22 - 5.81 MIL/uL   Hemoglobin 13.4 13.0 - 17.0 g/dL   HCT 39.4 39.0 - 52.0 %   MCV 86.8 78.0 - 100.0 fL   MCH 29.5 26.0 - 34.0 pg   MCHC 34.0 30.0 - 36.0 g/dL   RDW 13.0 11.5 - 15.5 %   Platelets 289 150 - 400 K/uL   Neutrophils Relative % 42 %   Neutro Abs 2.6 1.7 - 7.7 K/uL   Lymphocytes Relative 46 %    Lymphs Abs 3.0 0.7 - 4.0 K/uL   Monocytes Relative 8 %   Monocytes Absolute 0.5 0.1 - 1.0 K/uL   Eosinophils Relative 3 %   Eosinophils Absolute 0.2 0.0 - 0.7 K/uL   Basophils Relative 1 %   Basophils Absolute 0.0 0.0 - 0.1 K/uL  Basic metabolic panel     Status: Abnormal   Collection Time: 09/04/15  1:40 AM  Result Value Ref Range   Sodium 141 135 - 145 mmol/L   Potassium 4.5 3.5 - 5.1 mmol/L   Chloride 105 101 - 111 mmol/L   CO2 27 22 - 32 mmol/L   Glucose, Bld 117 (H) 65 - 99 mg/dL   BUN 18 6 - 20 mg/dL   Creatinine, Ser 1.09 0.61 - 1.24 mg/dL   Calcium 9.6 8.9 - 10.3 mg/dL   GFR calc non Af Amer >60 >60 mL/min   GFR calc Af Amer >60 >60 mL/min    Comment: (NOTE) The eGFR has been calculated using the CKD EPI equation. This calculation has not been validated in all clinical situations. eGFR's persistently <60 mL/min signify possible Chronic Kidney Disease.    Anion gap 9 5 - 15  I-Stat Troponin, ED (not at [facility])     Status: None   Collection Time: 09/04/15  4:55 AM  Result Value Ref Range   Troponin i, poc 0.00 0.00 - 0.08 ng/mL   Comment 3            Comment: Due to the release kinetics of cTnI, a negative result within the first hours of the onset of symptoms does not rule out myocardial infarction with certainty. If myocardial infarction is still suspected, repeat the test at appropriate intervals.   CBC     Status: Abnormal   Collection Time: 09/04/15  9:44 AM  Result Value Ref Range   WBC 4.7 4.0 - 10.5 K/uL   RBC 4.24 4.22 - 5.81 MIL/uL   Hemoglobin 12.6 (L) 13.0 - 17.0 g/dL   HCT 37.9 (L) 39.0 - 52.0 %   MCV 89.4 78.0 - 100.0 fL   MCH 29.7 26.0 - 34.0 pg   MCHC 33.2 30.0 - 36.0 g/dL   RDW 13.1 11.5 - 15.5 %   Platelets 244 150 - 400 K/uL  TSH     Status: None   Collection Time: 09/04/15  9:44 AM  Result Value Ref Range   TSH 2.666 0.350 - 4.500 uIU/mL  Troponin I     Status: None   Collection Time: 09/04/15  9:44 AM  Result Value Ref Range   Troponin  I <0.03 <0.031 ng/mL    Comment:        NO INDICATION  OF MYOCARDIAL INJURY.   APTT     Status: None   Collection Time: 09/04/15  9:44 AM  Result Value Ref Range   aPTT 29 24 - 37 seconds  Protime-INR     Status: None   Collection Time: 09/04/15  9:44 AM  Result Value Ref Range   Prothrombin Time 13.6 11.6 - 15.2 seconds   INR 1.02 0.00 - 1.49      Component Value Date/Time   SDES BLOOD LEFT HAND 09/03/2015 0122   SPECREQUEST BOTTLES DRAWN AEROBIC AND ANAEROBIC 5CC 09/03/2015 0122   CULT NO GROWTH 1 DAY 09/03/2015 0122   REPTSTATUS PENDING 09/03/2015 0122   Dg Chest 2 View  09/02/2015  CLINICAL DATA:  Chest pain and dyspnea, onset tonight. EXAM: CHEST  2 VIEW COMPARISON:  06/22/2014 FINDINGS: The lungs are clear. The pulmonary vasculature is normal. Heart size is normal. Hilar and mediastinal contours are unremarkable. There is no pleural effusion. IMPRESSION: No active cardiopulmonary disease. Electronically Signed   By: Andreas Newport M.D.   On: 09/02/2015 23:21   Recent Results (from the past 240 hour(s))  Blood culture (routine x 2)     Status: None (Preliminary result)   Collection Time: 09/03/15  1:15 AM  Result Value Ref Range Status   Specimen Description BLOOD RIGHT HAND  Final   Special Requests BOTTLES DRAWN AEROBIC ONLY 5CC  Final   Culture NO GROWTH 1 DAY  Final   Report Status PENDING  Incomplete  Culture, blood (Routine X 2) w Reflex to ID Panel     Status: None (Preliminary result)   Collection Time: 09/03/15  1:15 AM  Result Value Ref Range Status   Specimen Description BLOOD RIGHT HAND  Final   Special Requests   Final    BOTTLES DRAWN AEROBIC AND ANAEROBIC 6CC BLUE,5CC RED   Culture  Setup Time   Final    GRAM POSITIVE COCCI IN CLUSTERS AEROBIC BOTTLE ONLY CRITICAL RESULT CALLED TO, READ BACK BY AND VERIFIED WITH: [name] RN [id number] 09/03/15 A BROWNING    Culture   Final    GRAM POSITIVE COCCI CULTURE REINCUBATED FOR BETTER GROWTH    Report Status  PENDING  Incomplete  Blood culture (routine x 2)     Status: None (Preliminary result)   Collection Time: 09/03/15  1:22 AM  Result Value Ref Range Status   Specimen Description BLOOD LEFT HAND  Final   Special Requests BOTTLES DRAWN AEROBIC AND ANAEROBIC 5CC  Final   Culture NO GROWTH 1 DAY  Final   Report Status PENDING  Incomplete      09/04/2015, 2:47 PM        Records and images were personally reviewed where available.

## 2015-09-05 ENCOUNTER — Observation Stay (HOSPITAL_COMMUNITY): Payer: BLUE CROSS/BLUE SHIELD

## 2015-09-05 DIAGNOSIS — R509 Fever, unspecified: Secondary | ICD-10-CM | POA: Diagnosis not present

## 2015-09-05 DIAGNOSIS — K029 Dental caries, unspecified: Secondary | ICD-10-CM | POA: Diagnosis present

## 2015-09-05 DIAGNOSIS — I341 Nonrheumatic mitral (valve) prolapse: Secondary | ICD-10-CM | POA: Diagnosis present

## 2015-09-05 DIAGNOSIS — Z79899 Other long term (current) drug therapy: Secondary | ICD-10-CM | POA: Diagnosis not present

## 2015-09-05 DIAGNOSIS — R7881 Bacteremia: Secondary | ICD-10-CM | POA: Diagnosis not present

## 2015-09-05 DIAGNOSIS — L0291 Cutaneous abscess, unspecified: Secondary | ICD-10-CM | POA: Diagnosis not present

## 2015-09-05 DIAGNOSIS — R0789 Other chest pain: Secondary | ICD-10-CM | POA: Diagnosis not present

## 2015-09-05 DIAGNOSIS — F419 Anxiety disorder, unspecified: Secondary | ICD-10-CM | POA: Diagnosis not present

## 2015-09-05 DIAGNOSIS — F329 Major depressive disorder, single episode, unspecified: Secondary | ICD-10-CM | POA: Diagnosis not present

## 2015-09-05 DIAGNOSIS — Z8661 Personal history of infections of the central nervous system: Secondary | ICD-10-CM | POA: Diagnosis not present

## 2015-09-05 DIAGNOSIS — Z87891 Personal history of nicotine dependence: Secondary | ICD-10-CM | POA: Diagnosis not present

## 2015-09-05 DIAGNOSIS — Z9889 Other specified postprocedural states: Secondary | ICD-10-CM

## 2015-09-05 DIAGNOSIS — F988 Other specified behavioral and emotional disorders with onset usually occurring in childhood and adolescence: Secondary | ICD-10-CM | POA: Diagnosis not present

## 2015-09-05 DIAGNOSIS — I340 Nonrheumatic mitral (valve) insufficiency: Secondary | ICD-10-CM | POA: Diagnosis not present

## 2015-09-05 DIAGNOSIS — I7300 Raynaud's syndrome without gangrene: Secondary | ICD-10-CM | POA: Diagnosis present

## 2015-09-05 LAB — BASIC METABOLIC PANEL
ANION GAP: 9 (ref 5–15)
BUN: 15 mg/dL (ref 6–20)
CO2: 25 mmol/L (ref 22–32)
Calcium: 9.5 mg/dL (ref 8.9–10.3)
Chloride: 105 mmol/L (ref 101–111)
Creatinine, Ser: 0.9 mg/dL (ref 0.61–1.24)
GFR calc Af Amer: >60 mL/min (ref >60)
Glucose, Bld: 101 mg/dL — ABNORMAL HIGH (ref 65–99)
POTASSIUM: 4.5 mmol/L (ref 3.5–5.1)
SODIUM: 139 mmol/L (ref 135–145)

## 2015-09-05 LAB — ECHOCARDIOGRAM COMPLETE
HEIGHTINCHES: 73 in
WEIGHTICAEL: 2726.65 [oz_av]

## 2015-09-05 MED ORDER — VANCOMYCIN HCL 10 G IV SOLR
1500.0000 mg | Freq: Two times a day (BID) | INTRAVENOUS | Status: DC
  Administered 2015-09-05 (×2): 1500 mg via INTRAVENOUS
  Filled 2015-09-05 (×4): qty 1500

## 2015-09-05 NOTE — Progress Notes (Signed)
Family Medicine Teaching Service Daily Progress Note Intern Pager: [phone]  Patient name: Phillip [name] Medical record number: [id number] Date of birth: 09/04/1955 Age: 60 y.o. Gender: male  Primary Care Provider: Leandrew Koyanagi, MD Consultants: Cardiology  Code Status: Full   Pt Overview and Major Events to Date:   Assessment and Plan:  Phillip [name] is a 60 y.o. male presenting with bacteremia. PMH is significant for anxiety and encephalomeningitis.  Bacteremia: Remains afebrile, no leukocytosis on admission. 1/2 blood cultures have grown gram + cocci in clusters. Dental procedure about 4 weeks ago and was covered with clindamycin. Denies any prior cardiac surgeries or any joint replacements.Having bruising occurring under his fingernails on his left hand but appear to be subungual hematomas instead of splinter hemorrhages. - ECHO results no no evidence of a vegetation - Awaiting TEE, to be scheduled for tomorrow, NPO tonight   - ID consulted, continue vancomycin, per ID consider CT chest, neck to eval for lemiere's  - ID recommended dental scan, no abscess noted on DG Orthopantogram - Vancomycin x 2 days  - 5/1 Blood culture coagulase negative staphy and 5/2 blood cultures pending today   Chest tightness: Still with some chest tightness, no SOB, no chest pain.  istat troponin has been normal and EKG has been unchanged. No prior cardiac history. Heart score of 1. Troponin's x3  Negative. Repeat EKG this Am negative for ST changes or signs of ischemia    Other complaints: having fingers turning white, hot flashes, subungual hematomes and having red streaking on his upper thighs. These have all started since his dental procedure. Autoimmune work up has been unrevealing up to this point. He has also had several tick bites during this year. 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 Thought that his  infection was from a mosquito.   - continue adderall.   FEN/GI: saline lock, heart healthy diet.  Prophylaxis: lovenox   Disposition: admitted to [facility] for work up for bacteriemia.   Subjective:  Patient denies any fevers or chills. Still with some chest tightness, no SOB, no chest pain.  Objective: Temp:  [97.2 F (36.2 C)-97.8 F (36.6 C)] 97.2 F (36.2 C) (05/03 0529) Pulse Rate:  [51-65] 51 (05/03 0529) Resp:  [16] 16 (05/03 0529) BP: (147-156)/(77-89) 156/77 mmHg (05/03 0529) SpO2:  [100 %] 100 % (05/03 0529) Physical Exam: General: Patient lying in bed, NAD Eyes: no conjunctival hemorrhages  Cardiovascular: RRR, no murmurs  Respiratory: CTAB, no wheezes  Abdomen: BS+, no ttp, no distention  Extremities: subungual hemorrhages on left hand, no janeway lesions, no   Laboratory:  Recent Labs Lab 09/02/15 2228 09/04/15 0140 09/04/15 0944  WBC 5.5 6.3 4.7  HGB 12.9* 13.4 12.6*  HCT 38.9* 39.4 37.9*  PLT 282 289 244    Recent Labs Lab 09/02/15 2228 09/04/15 0140 09/05/15 0529  NA 139 141 139  K 3.9 4.5 4.5  CL 102 105 105  CO2 26 27 25   BUN 17 18 15   CREATININE 0.97 1.09 0.90  CALCIUM 9.7 9.6 9.5  GLUCOSE 129* 117* 101*    Imaging/Diagnostic Tests: Dg Orthopantogram  09/04/2015  CLINICAL DATA:  Possible abscess following a dental procedure, generalized jaw pain, initial encounter EXAM: ORTHOPANTOGRAM/PANORAMIC COMPARISON:  None. FINDINGS: Panoramic view of the mandible reveals no acute fracture. Multiple dental caries are noted as well as multiple fillings. No definitive periapical abscess is seen. IMPRESSION:  No definitive abscess noted. Dental caries and prior procedures are seen Electronically Signed   By: Inez Catalina M.D.   On: 09/04/2015 18:36    Lillianna Sabel Cletis Media, MD 09/05/2015, 9:47 AM PGY-1, [place] Intern pager: [phone], text pages welcome

## 2015-09-05 NOTE — Progress Notes (Signed)
Patients family concerned about how he will be sedated for his TEE tomorrow. Family member states he doesn't handle propofol very well. Joslyn Hy, MSN, RN, [employer]

## 2015-09-05 NOTE — Progress Notes (Signed)
INFECTIOUS DISEASE PROGRESS NOTE  ID: Phillip [name] is a 60 y.o. male with  Active Problems:   Positive blood culture   Bacteremia   Chest tightness   Anxiety   Abscess  Subjective: C/o chills  Abtx:  Anti-infectives    Start     Dose/Rate Route Frequency Ordered Stop   09/05/15 1100  vancomycin (VANCOCIN) 1,500 mg in sodium chloride 0.9 % 500 mL IVPB     1,500 mg 250 mL/hr over 120 Minutes Intravenous Every 12 hours 09/05/15 1057     09/04/15 2230  vancomycin (VANCOCIN) 1,500 mg in sodium chloride 0.9 % 500 mL IVPB  Status:  Discontinued     1,500 mg 250 mL/hr over 120 Minutes Intravenous 2 times daily 09/04/15 2224 09/05/15 1057   09/04/15 2223  vancomycin (VANCOCIN) 1,500 mg in sodium chloride 0.9 % 500 mL IVPB  Status:  Discontinued     1,500 mg 250 mL/hr over 120 Minutes Intravenous Every 12 hours 09/04/15 2223 09/04/15 2224   09/04/15 1830  vancomycin (VANCOCIN) 1,500 mg in sodium chloride 0.9 % 500 mL IVPB  Status:  Discontinued     1,500 mg 250 mL/hr over 120 Minutes Intravenous Every 12 hours 09/04/15 1804 09/04/15 2223   09/04/15 1800  vancomycin (VANCOCIN) 1,250 mg in sodium chloride 0.9 % 250 mL IVPB  Status:  Discontinued     1,250 mg 166.7 mL/hr over 90 Minutes Intravenous Every 12 hours 09/04/15 1756 09/04/15 1804   09/04/15 0415  vancomycin (VANCOCIN) IVPB 1000 mg/200 mL premix     1,000 mg 200 mL/hr over 60 Minutes Intravenous  Once 09/04/15 0408 09/04/15 0552      Medications:  Scheduled: . amphetamine-dextroamphetamine  30 mg Oral TID  . enoxaparin (LOVENOX) injection  40 mg Subcutaneous Q24H  . sodium chloride flush  3 mL Intravenous Q12H  . vancomycin  1,500 mg Intravenous Q12H    Objective: Vital signs in last 24 hours: Temp:  [97.2 F (36.2 C)-97.8 F (36.6 C)] 97.7 F (36.5 C) (05/03 1401) Pulse Rate:  [51-63] 63 (05/03 1401) Resp:  [14-16] 14 (05/03 1401) BP: (125-156)/(77-83) 125/77 mmHg (05/03 1401) SpO2:  [100 %] 100 % (05/03  1401)   General appearance: alert, cooperative and no distress Resp: clear to auscultation bilaterally Cardio: regular rate and rhythm GI: normal findings: bowel sounds normal and soft, non-tender  Lab Results  Recent Labs  09/04/15 0140 09/04/15 0944 09/05/15 0529  WBC 6.3 4.7  --   HGB 13.4 12.6*  --   HCT 39.4 37.9*  --   NA 141  --  139  K 4.5  --  4.5  CL 105  --  105  CO2 27  --  25  BUN 18  --  15  CREATININE 1.09  --  0.90   Liver Panel No results for input(s): PROT, ALBUMIN, AST, ALT, ALKPHOS, BILITOT, BILIDIR, IBILI in the last 72 hours. Sedimentation Rate No results for input(s): ESRSEDRATE in the last 72 hours. C-Reactive Protein No results for input(s): CRP in the last 72 hours.  Microbiology: Recent Results (from the past 240 hour(s))  Blood culture (routine x 2)     Status: None (Preliminary result)   Collection Time: 09/03/15  1:15 AM  Result Value Ref Range Status   Specimen Description BLOOD RIGHT HAND  Final   Special Requests BOTTLES DRAWN AEROBIC ONLY 5CC  Final   Culture NO GROWTH 2 DAYS  Final   Report Status PENDING  Incomplete  Culture, blood (Routine X 2) w Reflex to ID Panel     Status: Abnormal (Preliminary result)   Collection Time: 09/03/15  1:15 AM  Result Value Ref Range Status   Specimen Description BLOOD RIGHT HAND  Final   Special Requests   Final    BOTTLES DRAWN AEROBIC AND ANAEROBIC 6CC BLUE,5CC RED   Culture  Setup Time   Final    GRAM POSITIVE COCCI IN CLUSTERS AEROBIC BOTTLE ONLY CRITICAL RESULT CALLED TO, READ BACK BY AND VERIFIED WITH: [name] RN 2332 09/03/15 A BROWNING    Culture STAPHYLOCOCCUS SPECIES (COAGULASE NEGATIVE) (A)  Final   Report Status PENDING  Incomplete  Blood culture (routine x 2)     Status: None (Preliminary result)   Collection Time: 09/03/15  1:22 AM  Result Value Ref Range Status   Specimen Description BLOOD LEFT HAND  Final   Special Requests BOTTLES DRAWN AEROBIC AND ANAEROBIC 5CC  Final    Culture NO GROWTH 2 DAYS  Final   Report Status PENDING  Incomplete  Blood culture (routine x 2)     Status: None (Preliminary result)   Collection Time: 09/04/15  4:15 AM  Result Value Ref Range Status   Specimen Description BLOOD RIGHT ARM  Final   Special Requests BOTTLES DRAWN AEROBIC AND ANAEROBIC 10ML  Final   Culture NO GROWTH 1 DAY  Final   Report Status PENDING  Incomplete  Culture, blood (single)     Status: None (Preliminary result)   Collection Time: 09/04/15  4:20 AM  Result Value Ref Range Status   Specimen Description BLOOD LEFT FOREARM  Final   Special Requests IN PEDIATRIC BOTTLE 6ML  Final   Culture NO GROWTH 1 DAY  Final   Report Status PENDING  Incomplete  Blood culture (routine x 2)     Status: None (Preliminary result)   Collection Time: 09/04/15  7:27 AM  Result Value Ref Range Status   Specimen Description BLOOD RIGHT HAND  Final   Special Requests BOTTLES DRAWN AEROBIC ONLY 6CC  Final   Culture NO GROWTH 1 DAY  Final   Report Status PENDING  Incomplete    Studies/Results: Dg Orthopantogram  09/04/2015  CLINICAL DATA:  Possible abscess following a dental procedure, generalized jaw pain, initial encounter EXAM: ORTHOPANTOGRAM/PANORAMIC COMPARISON:  None. FINDINGS: Panoramic view of the mandible reveals no acute fracture. Multiple dental caries are noted as well as multiple fillings. No definitive periapical abscess is seen. IMPRESSION: No definitive abscess noted. Dental caries and prior procedures are seen Electronically Signed   By: Inez Catalina M.D.   On: 09/04/2015 18:36     Assessment/Plan: Coag Neg Staph 1/6 BCx Recent dental procedure  Await his TEE.  Will ask lab to hold BCx for 2 weeks.  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  No change in atbx for now panorex negative.   Total days of antibiotics: [place] [facility] (pager)  [phone] [email or web address] 09/05/2015, 4:07 PM  LOS: 0 days

## 2015-09-05 NOTE — Progress Notes (Signed)
    [facility] has been requested to perform a transesophageal echocardiogram on Phillip [name] for Bacteremia.  After careful review of history and examination, the risks and benefits of transesophageal echocardiogram have been explained including risks of esophageal damage, perforation (1:10,000 risk), bleeding, pharyngeal hematoma as well as other potential complications associated with conscious sedation including aspiration, arrhythmia, respiratory failure and death. Alternatives to treatment were discussed, questions were answered. Patient is willing to proceed.   **Of note, pt reports prior adverse reaction to propofol.  Murray Hodgkins,  09/05/2015 3:54 PM

## 2015-09-05 NOTE — Progress Notes (Signed)
  Echocardiogram 2D Echocardiogram has been performed.  Phillip [name] 09/05/2015, 8:57 AM

## 2015-09-06 ENCOUNTER — Encounter (HOSPITAL_COMMUNITY): Payer: Self-pay

## 2015-09-06 ENCOUNTER — Inpatient Hospital Stay (HOSPITAL_COMMUNITY): Payer: BLUE CROSS/BLUE SHIELD

## 2015-09-06 ENCOUNTER — Encounter (HOSPITAL_COMMUNITY)
Admission: EM | Disposition: A | Discharge status: Home / Self Care | Payer: Self-pay | Source: Home / Self Care | Attending: Family Medicine

## 2015-09-06 ENCOUNTER — Ambulatory Visit: Payer: BLUE CROSS/BLUE SHIELD | Admitting: Cardiovascular Disease

## 2015-09-06 DIAGNOSIS — R509 Fever, unspecified: Secondary | ICD-10-CM

## 2015-09-06 DIAGNOSIS — I340 Nonrheumatic mitral (valve) insufficiency: Secondary | ICD-10-CM

## 2015-09-06 HISTORY — PX: TEE WITHOUT CARDIOVERSION: SHX5443

## 2015-09-06 LAB — BASIC METABOLIC PANEL
ANION GAP: 11 (ref 5–15)
BUN: 15 mg/dL (ref 6–20)
CHLORIDE: 108 mmol/L (ref 101–111)
CO2: 21 mmol/L — AB (ref 22–32)
Calcium: 9.1 mg/dL (ref 8.9–10.3)
Creatinine, Ser: 0.74 mg/dL (ref 0.61–1.24)
GFR calc Af Amer: >60 mL/min (ref >60)
GFR calc non Af Amer: >60 mL/min (ref >60)
GLUCOSE: 93 mg/dL (ref 65–99)
POTASSIUM: 4.2 mmol/L (ref 3.5–5.1)
Sodium: 140 mmol/L (ref 135–145)

## 2015-09-06 LAB — CULTURE, BLOOD (ROUTINE X 2)

## 2015-09-06 LAB — CBC
HEMATOCRIT: 41.8 % (ref 39.0–52.0)
Hemoglobin: 13.9 g/dL (ref 13.0–17.0)
MCH: 29.9 pg (ref 26.0–34.0)
MCHC: 33.3 g/dL (ref 30.0–36.0)
MCV: 89.9 fL (ref 78.0–100.0)
Platelets: 259 10*3/uL (ref 150–400)
RBC: 4.65 MIL/uL (ref 4.22–5.81)
RDW: 13 % (ref 11.5–15.5)
WBC: 6.3 10*3/uL (ref 4.0–10.5)

## 2015-09-06 LAB — VANCOMYCIN, TROUGH: Vancomycin Tr: 15 ug/mL (ref 10.0–20.0)

## 2015-09-06 SURGERY — ECHOCARDIOGRAM, TRANSESOPHAGEAL
Anesthesia: Moderate Sedation

## 2015-09-06 MED ORDER — FENTANYL CITRATE (PF) 100 MCG/2ML IJ SOLN
INTRAMUSCULAR | Status: AC
  Filled 2015-09-06: qty 2

## 2015-09-06 MED ORDER — DIPHENHYDRAMINE HCL 50 MG/ML IJ SOLN
INTRAMUSCULAR | Status: AC
  Filled 2015-09-06: qty 1

## 2015-09-06 MED ORDER — SODIUM CHLORIDE 0.9 % IV SOLN
INTRAVENOUS | Status: DC
  Administered 2015-09-06: 11:00:00 via INTRAVENOUS

## 2015-09-06 MED ORDER — MIDAZOLAM HCL 5 MG/ML IJ SOLN
INTRAMUSCULAR | Status: AC
  Filled 2015-09-06: qty 2

## 2015-09-06 MED ORDER — BUTAMBEN-TETRACAINE-BENZOCAINE 2-2-14 % EX AERO
INHALATION_SPRAY | CUTANEOUS | Status: DC | PRN
  Administered 2015-09-06: 2 via TOPICAL

## 2015-09-06 MED ORDER — FENTANYL CITRATE (PF) 100 MCG/2ML IJ SOLN
INTRAMUSCULAR | Status: DC | PRN
  Administered 2015-09-06: 25 ug via INTRAVENOUS
  Administered 2015-09-06: 50 ug via INTRAVENOUS
  Administered 2015-09-06: 25 ug via INTRAVENOUS

## 2015-09-06 MED ORDER — MIDAZOLAM HCL 10 MG/2ML IJ SOLN
INTRAMUSCULAR | Status: DC | PRN
  Administered 2015-09-06 (×2): 2 mg via INTRAVENOUS

## 2015-09-06 NOTE — H&P (View-Only) (Signed)
INFECTIOUS DISEASE PROGRESS NOTE  ID: Phillip [name] is a 60 y.o. male with  Active Problems:   Positive blood culture   Bacteremia   Chest tightness   Anxiety   Abscess  Subjective: C/o chills  Abtx:  Anti-infectives    Start     Dose/Rate Route Frequency Ordered Stop   09/05/15 1100  vancomycin (VANCOCIN) 1,500 mg in sodium chloride 0.9 % 500 mL IVPB     1,500 mg 250 mL/hr over 120 Minutes Intravenous Every 12 hours 09/05/15 1057     09/04/15 2230  vancomycin (VANCOCIN) 1,500 mg in sodium chloride 0.9 % 500 mL IVPB  Status:  Discontinued     1,500 mg 250 mL/hr over 120 Minutes Intravenous 2 times daily 09/04/15 2224 09/05/15 1057   09/04/15 2223  vancomycin (VANCOCIN) 1,500 mg in sodium chloride 0.9 % 500 mL IVPB  Status:  Discontinued     1,500 mg 250 mL/hr over 120 Minutes Intravenous Every 12 hours 09/04/15 2223 09/04/15 2224   09/04/15 1830  vancomycin (VANCOCIN) 1,500 mg in sodium chloride 0.9 % 500 mL IVPB  Status:  Discontinued     1,500 mg 250 mL/hr over 120 Minutes Intravenous Every 12 hours 09/04/15 1804 09/04/15 2223   09/04/15 1800  vancomycin (VANCOCIN) 1,250 mg in sodium chloride 0.9 % 250 mL IVPB  Status:  Discontinued     1,250 mg 166.7 mL/hr over 90 Minutes Intravenous Every 12 hours 09/04/15 1756 09/04/15 1804   09/04/15 0415  vancomycin (VANCOCIN) IVPB 1000 mg/200 mL premix     1,000 mg 200 mL/hr over 60 Minutes Intravenous  Once 09/04/15 0408 09/04/15 0552      Medications:  Scheduled: . amphetamine-dextroamphetamine  30 mg Oral TID  . enoxaparin (LOVENOX) injection  40 mg Subcutaneous Q24H  . sodium chloride flush  3 mL Intravenous Q12H  . vancomycin  1,500 mg Intravenous Q12H    Objective: Vital signs in last 24 hours: Temp:  [97.2 F (36.2 C)-97.8 F (36.6 C)] 97.7 F (36.5 C) (05/03 1401) Pulse Rate:  [51-63] 63 (05/03 1401) Resp:  [14-16] 14 (05/03 1401) BP: (125-156)/(77-83) 125/77 mmHg (05/03 1401) SpO2:  [100 %] 100 % (05/03  1401)   General appearance: alert, cooperative and no distress Resp: clear to auscultation bilaterally Cardio: regular rate and rhythm GI: normal findings: bowel sounds normal and soft, non-tender  Lab Results  Recent Labs  09/04/15 0140 09/04/15 0944 09/05/15 0529  WBC 6.3 4.7  --   HGB 13.4 12.6*  --   HCT 39.4 37.9*  --   NA 141  --  139  K 4.5  --  4.5  CL 105  --  105  CO2 27  --  25  BUN 18  --  15  CREATININE 1.09  --  0.90   Liver Panel No results for input(s): PROT, ALBUMIN, AST, ALT, ALKPHOS, BILITOT, BILIDIR, IBILI in the last 72 hours. Sedimentation Rate No results for input(s): ESRSEDRATE in the last 72 hours. C-Reactive Protein No results for input(s): CRP in the last 72 hours.  Microbiology: Recent Results (from the past 240 hour(s))  Blood culture (routine x 2)     Status: None (Preliminary result)   Collection Time: 09/03/15  1:15 AM  Result Value Ref Range Status   Specimen Description BLOOD RIGHT HAND  Final   Special Requests BOTTLES DRAWN AEROBIC ONLY 5CC  Final   Culture NO GROWTH 2 DAYS  Final   Report Status PENDING  Incomplete  Culture, blood (Routine X 2) w Reflex to ID Panel     Status: Abnormal (Preliminary result)   Collection Time: 09/03/15  1:15 AM  Result Value Ref Range Status   Specimen Description BLOOD RIGHT HAND  Final   Special Requests   Final    BOTTLES DRAWN AEROBIC AND ANAEROBIC 6CC BLUE,5CC RED   Culture  Setup Time   Final    GRAM POSITIVE COCCI IN CLUSTERS AEROBIC BOTTLE ONLY CRITICAL RESULT CALLED TO, READ BACK BY AND VERIFIED WITH: [name] RN 2332 09/03/15 A BROWNING    Culture STAPHYLOCOCCUS SPECIES (COAGULASE NEGATIVE) (A)  Final   Report Status PENDING  Incomplete  Blood culture (routine x 2)     Status: None (Preliminary result)   Collection Time: 09/03/15  1:22 AM  Result Value Ref Range Status   Specimen Description BLOOD LEFT HAND  Final   Special Requests BOTTLES DRAWN AEROBIC AND ANAEROBIC 5CC  Final    Culture NO GROWTH 2 DAYS  Final   Report Status PENDING  Incomplete  Blood culture (routine x 2)     Status: None (Preliminary result)   Collection Time: 09/04/15  4:15 AM  Result Value Ref Range Status   Specimen Description BLOOD RIGHT ARM  Final   Special Requests BOTTLES DRAWN AEROBIC AND ANAEROBIC 10ML  Final   Culture NO GROWTH 1 DAY  Final   Report Status PENDING  Incomplete  Culture, blood (single)     Status: None (Preliminary result)   Collection Time: 09/04/15  4:20 AM  Result Value Ref Range Status   Specimen Description BLOOD LEFT FOREARM  Final   Special Requests IN PEDIATRIC BOTTLE 6ML  Final   Culture NO GROWTH 1 DAY  Final   Report Status PENDING  Incomplete  Blood culture (routine x 2)     Status: None (Preliminary result)   Collection Time: 09/04/15  7:27 AM  Result Value Ref Range Status   Specimen Description BLOOD RIGHT HAND  Final   Special Requests BOTTLES DRAWN AEROBIC ONLY 6CC  Final   Culture NO GROWTH 1 DAY  Final   Report Status PENDING  Incomplete    Studies/Results: Dg Orthopantogram  09/04/2015  CLINICAL DATA:  Possible abscess following a dental procedure, generalized jaw pain, initial encounter EXAM: ORTHOPANTOGRAM/PANORAMIC COMPARISON:  None. FINDINGS: Panoramic view of the mandible reveals no acute fracture. Multiple dental caries are noted as well as multiple fillings. No definitive periapical abscess is seen. IMPRESSION: No definitive abscess noted. Dental caries and prior procedures are seen Electronically Signed   By: Inez Catalina M.D.   On: 09/04/2015 18:36     Assessment/Plan: Coag Neg Staph 1/6 BCx Recent dental procedure  Await his TEE.  Will ask lab to hold BCx for 2 weeks.  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  No change in atbx for now panorex negative.   Total days of antibiotics: [place] [facility] (pager)  [phone] [email or web address] 09/05/2015, 4:07 PM  LOS: 0 days

## 2015-09-06 NOTE — Progress Notes (Addendum)
INFECTIOUS DISEASE PROGRESS NOTE  ID: Phillip [name] is a 60 y.o. male with  Active Problems:   Positive blood culture   Bacteremia   Chest tightness   Anxiety   Abscess  Subjective: Without complaints  Abtx:  Anti-infectives    Start     Dose/Rate Route Frequency Ordered Stop   09/05/15 1100  vancomycin (VANCOCIN) 1,500 mg in sodium chloride 0.9 % 500 mL IVPB     1,500 mg 250 mL/hr over 120 Minutes Intravenous Every 12 hours 09/05/15 1057     09/04/15 2230  vancomycin (VANCOCIN) 1,500 mg in sodium chloride 0.9 % 500 mL IVPB  Status:  Discontinued     1,500 mg 250 mL/hr over 120 Minutes Intravenous 2 times daily 09/04/15 2224 09/05/15 1057   09/04/15 2223  vancomycin (VANCOCIN) 1,500 mg in sodium chloride 0.9 % 500 mL IVPB  Status:  Discontinued     1,500 mg 250 mL/hr over 120 Minutes Intravenous Every 12 hours 09/04/15 2223 09/04/15 2224   09/04/15 1830  vancomycin (VANCOCIN) 1,500 mg in sodium chloride 0.9 % 500 mL IVPB  Status:  Discontinued     1,500 mg 250 mL/hr over 120 Minutes Intravenous Every 12 hours 09/04/15 1804 09/04/15 2223   09/04/15 1800  vancomycin (VANCOCIN) 1,250 mg in sodium chloride 0.9 % 250 mL IVPB  Status:  Discontinued     1,250 mg 166.7 mL/hr over 90 Minutes Intravenous Every 12 hours 09/04/15 1756 09/04/15 1804   09/04/15 0415  vancomycin (VANCOCIN) IVPB 1000 mg/200 mL premix     1,000 mg 200 mL/hr over 60 Minutes Intravenous  Once 09/04/15 0408 09/04/15 0552      Medications:  Scheduled: . amphetamine-dextroamphetamine  30 mg Oral TID  . enoxaparin (LOVENOX) injection  40 mg Subcutaneous Q24H  . sodium chloride flush  3 mL Intravenous Q12H  . vancomycin  1,500 mg Intravenous Q12H    Objective: Vital signs in last 24 hours: Temp:  [97.6 F (36.4 C)-97.8 F (36.6 C)] 97.6 F (36.4 C) (05/04 1118) Pulse Rate:  [55-68] 59 (05/04 1310) Resp:  [10-21] 14 (05/04 1310) BP: (92-154)/(54-96) 138/77 mmHg (05/04 1310) SpO2:  [99 %-100 %] 100 %  (05/04 1310) Weight:  [79.379 kg (175 lb)] 79.379 kg (175 lb) (05/04 1118)   General appearance: alert, cooperative and no distress Resp: clear to auscultation bilaterally Cardio: regular rate and rhythm GI: normal findings: bowel sounds normal and soft, non-tender  Lab Results  Recent Labs  09/04/15 0944 09/05/15 0529 09/06/15 0644 09/06/15 0751  WBC 4.7  --  6.3  --   HGB 12.6*  --  13.9  --   HCT 37.9*  --  41.8  --   NA  --  139  --  140  K  --  4.5  --  4.2  CL  --  105  --  108  CO2  --  25  --  21*  BUN  --  15  --  15  CREATININE  --  0.90  --  0.74   Liver Panel No results for input(s): PROT, ALBUMIN, AST, ALT, ALKPHOS, BILITOT, BILIDIR, IBILI in the last 72 hours. Sedimentation Rate No results for input(s): ESRSEDRATE in the last 72 hours. C-Reactive Protein No results for input(s): CRP in the last 72 hours.  Microbiology: Recent Results (from the past 240 hour(s))  Blood culture (routine x 2)     Status: None (Preliminary result)   Collection Time: 09/03/15  1:15 AM  Result Value Ref Range Status   Specimen Description BLOOD RIGHT HAND  Final   Special Requests BOTTLES DRAWN AEROBIC ONLY 5CC  Final   Culture NO GROWTH 2 DAYS  Final   Report Status PENDING  Incomplete  Culture, blood (Routine X 2) w Reflex to ID Panel     Status: Abnormal   Collection Time: 09/03/15  1:15 AM  Result Value Ref Range Status   Specimen Description BLOOD RIGHT HAND  Final   Special Requests   Final    BOTTLES DRAWN AEROBIC AND ANAEROBIC 6CC BLUE,5CC RED   Culture  Setup Time   Final    GRAM POSITIVE COCCI IN CLUSTERS AEROBIC BOTTLE ONLY CRITICAL RESULT CALLED TO, READ BACK BY AND VERIFIED WITH: [name] RN 2332 09/03/15 A BROWNING    Culture (A)  Final    STAPHYLOCOCCUS SPECIES (COAGULASE NEGATIVE) THE SIGNIFICANCE OF ISOLATING THIS ORGANISM FROM A SINGLE SET OF BLOOD CULTURES WHEN MULTIPLE SETS ARE DRAWN IS UNCERTAIN. PLEASE NOTIFY THE MICROBIOLOGY DEPARTMENT WITHIN ONE  WEEK IF SPECIATION AND SENSITIVITIES ARE REQUIRED.    Report Status 09/06/2015 FINAL  Final  Blood culture (routine x 2)     Status: None (Preliminary result)   Collection Time: 09/03/15  1:22 AM  Result Value Ref Range Status   Specimen Description BLOOD LEFT HAND  Final   Special Requests BOTTLES DRAWN AEROBIC AND ANAEROBIC 5CC  Final   Culture NO GROWTH 2 DAYS  Final   Report Status PENDING  Incomplete  Blood culture (routine x 2)     Status: None (Preliminary result)   Collection Time: 09/04/15  4:15 AM  Result Value Ref Range Status   Specimen Description BLOOD RIGHT ARM  Final   Special Requests BOTTLES DRAWN AEROBIC AND ANAEROBIC 10ML  Final   Culture NO GROWTH 1 DAY  Final   Report Status PENDING  Incomplete  Culture, blood (single)     Status: None (Preliminary result)   Collection Time: 09/04/15  4:20 AM  Result Value Ref Range Status   Specimen Description BLOOD LEFT FOREARM  Final   Special Requests IN PEDIATRIC BOTTLE 6ML  Final   Culture NO GROWTH 1 DAY  Final   Report Status PENDING  Incomplete  Blood culture (routine x 2)     Status: None (Preliminary result)   Collection Time: 09/04/15  7:27 AM  Result Value Ref Range Status   Specimen Description BLOOD RIGHT HAND  Final   Special Requests BOTTLES DRAWN AEROBIC ONLY [place]  Final   Culture NO GROWTH 1 DAY  Final   Report Status PENDING  Incomplete    Studies/Results: Dg Orthopantogram  09/04/2015  CLINICAL DATA:  Possible abscess following a dental procedure, generalized jaw pain, initial encounter EXAM: ORTHOPANTOGRAM/PANORAMIC COMPARISON:  None. FINDINGS: Panoramic view of the mandible reveals no acute fracture. Multiple dental caries are noted as well as multiple fillings. No definitive periapical abscess is seen. IMPRESSION: No definitive abscess noted. Dental caries and prior procedures are seen Electronically Signed   By: Inez Catalina M.D.   On: 09/04/2015 18:36     Assessment/Plan: Coag negative staph in  1/6 BCx (3 drawn off atbx) Negative TEE for vegetations Recent dental procedure  Would stop atbx Could consider CT chest/abd/pelvis to further w/u his FUO (although he has been afebrile in hospital).  Otherwise can complete w/u outpt Explained at length to pt Available as needed.   Total days of antibiotics: 2 vanco  [name] (pager) [phone] [email or web address] 09/06/2015, 1:58 PM  LOS: 1 day

## 2015-09-06 NOTE — Progress Notes (Signed)
Pharmacy Antibiotic Note Phillip [name] is a 60 y.o. male called back to hospital on 09/04/2015 for positive blood cx in setting of recent dental procedure and visit to ER on 4/30 with chest tightness and SOB. Pharmacy consulted to dose vancomycin.   Vancomycin trough 15 considered therapeutic and true trough. Renal function stable with creatinine 0.74. WBC 6.3, afebrile.    Plan: 1. Vancomycin 1500 IV every 12 hours.  Goal trough 15-20 mcg/mL.  2. SCr Q 72H while on vancomycin 3. Abx guidance per ID 4. Will monitor for clinical symptoms of improvement, CBC, and trough levels as needed.   Height: 6\' 1"  (185.4 cm) Weight: 175 lb (79.379 kg) IBW/kg (Calculated) : 79.9  Temp (24hrs), Avg:97.7 F (36.5 C), Min:97.6 F (36.4 C), Max:97.8 F (36.6 C)   Recent Labs Lab 09/02/15 2228 09/04/15 0140 09/04/15 0944 09/05/15 0529 09/06/15 0644 09/06/15 0751 09/06/15 1025  WBC 5.5 6.3 4.7  --  6.3  --   --   CREATININE 0.97 1.09  --  0.90  --  0.74  --   VANCOTROUGH  --   --   --   --   --   --  15    Estimated Creatinine Clearance: 113 mL/min (by C-G formula based on Cr of 0.74).    Allergies  Allergen Reactions  . Amphetamines     Intolerant to specific formulations: [employer] amphetamine TEVA dextroamp amphetamine  . Lorazepam     Mylan lorazepam  . Methylprednisolone   . Penicillins Hives  . Percocet [Oxycodone-Acetaminophen]   . Propofol     Cognitive delay and emotional    Antimicrobials this admission: 5/2 Vancomycin >>   Dose adjustments this admission: n/a  Microbiology results: 5/2 BCx: px 5/1 BCx: GPC in 1/2   Thank you for allowing pharmacy to be a part of this patient's care.  [place], Student-PharmD  09/06/2015, 12:01 PM

## 2015-09-06 NOTE — Discharge Summary (Signed)
[facility] Discharge Summary  Patient name: Phillip [name] Medical record number: [id number] Date of birth: [birth year] Age: 60 y.o. Gender: male Date of Admission: 09/04/2015  Date of Discharge: 09/07/2015  Admitting Physician: Dickie La, MD  Primary Care Provider: Leandrew Koyanagi, MD Consultants: Infectious Disease   Indication for Hospitalization:  Bacteremia   Discharge Diagnoses/Problem List:  Patient Active Problem List   Diagnosis Date Noted  . Abscess   . Positive blood culture 09/04/2015  . Bacteremia 09/04/2015  . Chest tightness   . Anxiety   . Cognitive impairment 01/31/2013  . 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 ADD (attention deficit disorder) 03/10/2012  . Depression with anxiety 03/10/2012   Disposition: Home   Discharge Condition: stable   Discharge Exam:  General: Patient lying in bed, NAD Cardiovascular: RRR, no murmurs  Respiratory: CTAB, no wheezes  Abdomen: BS+, no ttp, no distention   Brief Hospital Course: Phillip [name] is 60 y.o. with pmhx of anxiety and encephalomeningitis presenting with outpatient blood culture positive for gram positive cocci in clusters. Patient had received outpatient work up due to hot flashes and described raynaud's for 4 weeks  Following a hx of dental work. During this admission, repeat blood cultures were obtained, wbc was monitored. Patient was started on vancomycin and infectious disease was consulted.  ID recommended an ECHO and TEE to check for endocarditis,  no vegetations were noted on imaging. Initial blood culture 1 of 2 bottles grew coagulase negative staphyloccocus, while other repeat blood cultures remained negative. Therefore, per ID recommendations, vancomycin was stopped as this was thought likely to be containment. ID recommended possible CT chest, abdomen, and pelvis to determine another source of hot flashes such as lymphoma. Patient stated he wanted to obtain this procedures as an  outpatient.   On admission patient was also noted to have some chest tightness. Patient received as cardiac work up with negative troponin x 3 and EKG without signs of ischemia. Patient chest tightness self resolved during his stay.    Issues for Follow Up:  1. Would get an outpatient CT chest, abdomen, and pelvis   Significant Procedures: None   Significant Labs and Imaging:   Recent Labs Lab 09/02/15 2228 09/04/15 0140 09/04/15 0944  WBC 5.5 6.3 4.7  HGB 12.9* 13.4 12.6*  HCT 38.9* 39.4 37.9*  PLT 282 289 244    Recent Labs Lab 09/02/15 2228 09/04/15 0140 09/05/15 0529  NA 139 141 139  K 3.9 4.5 4.5  CL 102 105 105  CO2 26 27 25   GLUCOSE 129* 117* 101*  BUN 17 18 15   CREATININE 0.97 1.09 0.90  CALCIUM 9.7 9.6 9.5     Results/Tests Pending at Time of Discharge: None   Discharge Medications:    Medication List    ASK your doctor about these medications        ADDERALL 30 MG tablet  Generic drug:  amphetamine-dextroamphetamine  Take 1 tablet by mouth 3 (three) times daily.     FISH OIL PO  Take 1 capsule by mouth daily.     LORazepam 2 MG tablet  Commonly known as:  ATIVAN  Take 1 tablet (2 mg total) by mouth at bedtime.     OVER THE COUNTER MEDICATION  Take 1 tablet by mouth 2 (two) times daily. Brain/Memory taking 2 a day        Discharge Instructions: Please refer to Patient Instructions section of EMR for full details.  Patient was  counseled important signs and symptoms that should prompt return to medical care, changes in medications, dietary instructions, activity restrictions, and follow up appointments.   Follow-Up Appointments:   Tonette Bihari, MD 09/06/2015, 7:19 AM PGY-1, [place]

## 2015-09-06 NOTE — CV Procedure (Signed)
    TEE  Indication: bacteremia  During this procedure the patient is administered a total of Versed 6 mg and Fentanyl 75 mg to achieve and maintain moderate conscious sedation.  The patient's heart rate, blood pressure, and oxygen saturation are monitored continuously during the procedure. The period of conscious sedation is 15 minutes, of which I was present face-to-face 100% of this time.  Findings:  Normal EF Mild P2 mitral valve prolapse with mild mitral regurgitation  No vegetations.  Candee Furbish, MD

## 2015-09-06 NOTE — Progress Notes (Signed)
Family Medicine Teaching Service Daily Progress Note Intern Pager: [phone]  Patient name: Phillip [name] Medical record number: [id number] Date of birth: May 21, 1955 Age: 60 y.o. Gender: male  Primary Care Provider: Leandrew Koyanagi, MD Consultants: ID Code Status: Full   Pt Overview and Major Events to Date:   Assessment and Plan:  [name] is a 60 y.o. male presenting with bacteremia. PMH is significant for anxiety and encephalomeningitis.  Bacteremia: Remains afebrile, no leukocytosis. Dental procedure about 4 weeks ago and was covered with clindamycin. Denies any prior cardiac surgeries or any joint replacements.Having bruising occurring under his fingernails on his left hand but appear to be subungual hematomas instead of splinter hemorrhages. - ECHO results no evidence of a vegetation - Awaiting TEE, to be scheduled today - ID consulted, continue vancomycin, per ID consider CT chest, neck to eval for lemiere's  - Vancomycin x 3 days  - 5/1 Blood culture coagulase negative staphy 1 bottle, 2 bottle NGTD x 2 days and  5/2 blood cultures NGTD x 1 day   Other complaints: having fingers turning white, hot flashes, subungual hematomes and having red streaking on his upper thighs. These have all started since his dental procedure. Autoimmune work up has been unrevealing up to this point. He has also had several tick bites during this year. 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 Thought that his infection was from a mosquito.   - continue adderall.   FEN/GI: saline lock, NPO for TEE Prophylaxis: lovenox   Disposition:  Med-surg   Subjective:  Patient continues to have hot flashes, however is not febrile. No chest pressure this morning   Objective: Temp:  [97.7 F (36.5 C)-97.8 F (36.6 C)] 97.8 F (36.6 C) (05/04 0605) Pulse Rate:  [59-66] 59 (05/04 0605) Resp:  [14-17] 16  (05/04 0605) BP: (122-130)/(66-80) 122/66 mmHg (05/04 0605) SpO2:  [100 %] 100 % (05/04 [id number])  Physical Exam: General: Patient lying in bed, NAD Cardiovascular: RRR, no murmurs  Respiratory: CTAB, no wheezes  Abdomen: BS+, no ttp, no distention    Laboratory:  Recent Labs Lab 09/02/15 2228 09/04/15 0140 09/04/15 0944  WBC 5.5 6.3 4.7  HGB 12.9* 13.4 12.6*  HCT 38.9* 39.4 37.9*  PLT 282 289 244    Recent Labs Lab 09/02/15 2228 09/04/15 0140 09/05/15 0529  NA 139 141 139  K 3.9 4.5 4.5  CL 102 105 105  CO2 26 27 25   BUN 17 18 15   CREATININE 0.97 1.09 0.90  CALCIUM 9.7 9.6 9.5  GLUCOSE 129* 117* 101*    Imaging/Diagnostic Tests: Dg Orthopantogram  09/04/2015  CLINICAL DATA:  Possible abscess following a dental procedure, generalized jaw pain, initial encounter EXAM: ORTHOPANTOGRAM/PANORAMIC COMPARISON:  None. FINDINGS: Panoramic view of the mandible reveals no acute fracture. Multiple dental caries are noted as well as multiple fillings. No definitive periapical abscess is seen. IMPRESSION: No definitive abscess noted. Dental caries and prior procedures are seen Electronically Signed   By: Phillip [name] M.D.   On: 09/04/2015 18:36    Iridessa Harrow Cletis Media, MD 09/06/2015, 7:11 AM PGY-1, [place] Intern pager: [phone], text pages welcome

## 2015-09-06 NOTE — Progress Notes (Signed)
Patient reports his fingers going white for "a few seconds" once; and frequent "hot flashes" lasting a few seconds about every half hour.  Will continue to monitor and inform MD/NP as needed.

## 2015-09-06 NOTE — Progress Notes (Signed)
Echocardiogram Echocardiogram Transesophageal has been performed.  Joelene Millin 09/06/2015, 12:54 PM

## 2015-09-06 NOTE — Interval H&P Note (Signed)
History and Physical Interval Note:  09/06/2015 11:44 AM  Phillip [name]  has presented today for surgery, with the diagnosis of BACTEREMIA  The various methods of treatment have been discussed with the patient and family. After consideration of risks, benefits and other options for treatment, the patient has consented to  Procedure(s): TRANSESOPHAGEAL ECHOCARDIOGRAM (TEE) (N/A) as a surgical intervention .  The patient's history has been reviewed, patient examined, no change in status, stable for surgery.  I have reviewed the patient's chart and labs.  Questions were answered to the patient's satisfaction.     [name], [place]

## 2015-09-07 ENCOUNTER — Encounter (HOSPITAL_COMMUNITY): Payer: Self-pay | Admitting: Cardiology

## 2015-09-07 NOTE — Progress Notes (Signed)
Attempted to administer morning dose of adderall. Pt refused stating he wanted to sleep in more and that he would take the 1100 dose.

## 2015-09-07 NOTE — Progress Notes (Signed)
NURSING PROGRESS NOTE  ALYAS COPPESS [id number] Discharge Data: 09/07/2015 4:32 PM Attending Provider: No att. providers found [id number], Linton Ham, MD     [name] to be D/C'd Home per MD order.  Discussed with the patient the After Visit Summary and all questions fully answered. All IV's discontinued with no bleeding noted. All belongings returned to patient for patient to take home.   Last Vital Signs:  Blood pressure 125/67, pulse 48, temperature 97.4 F (36.3 C), temperature source Oral, resp. rate 20, height 6\' 1"  (1.854 m), weight 79.379 kg (175 lb), SpO2 100 %.  Discharge Medication List   Medication List    TAKE these medications        ADDERALL 30 MG tablet  Generic drug:  amphetamine-dextroamphetamine  Take 1 tablet by mouth 3 (three) times daily.     FISH OIL PO  Take 1 capsule by mouth daily.     LORazepam 2 MG tablet  Commonly known as:  ATIVAN  Take 1 tablet (2 mg total) by mouth at bedtime.     OVER THE COUNTER MEDICATION  Take 1 tablet by mouth 2 (two) times daily. Brain/Memory taking 2 a day

## 2015-09-07 NOTE — Progress Notes (Signed)
Family Medicine Teaching Service Daily Progress Note Intern Pager: [phone]  Patient name: Phillip [name] Medical record number: [id number] Date of birth: 11/20/1955 Age: 60 y.o. Gender: male  Primary Care Provider: Leandrew Koyanagi, MD Consultants: ID Code Status: Full   Pt Overview and Major Events to Date:   Assessment and Plan:  Phillip [name] is a 59 y.o. male presenting with bacteremia. PMH is significant for anxiety and encephalomeningitis.  Bacteremia: Remains afebrile, no leukocytosis. Dental procedure about 4 weeks ago and was covered with clindamycin. Denies any prior cardiac surgeries or any joint replacements.Having bruising occurring under his fingernails on his left hand but appear to be subungual hematomas instead of splinter hemorrhages. Echo and TEE negative for vegetations  - ID consulted, consider CT chest/abd/pelvis if want to follow up with possibly fever of unknown origin, however feels it completely reasonable to send him home without this due to lack of fevers during the hospitalization  - Vancomycin x 3 days  - 5/1 Blood culture coagulase negative staphy 1 bottle, 2 bottle NGTD x 3 days and  5/2 blood cultures NGTD x 2 day   Other complaints: having fingers turning white, hot flashes, subungual hematomes and having red streaking on his upper thighs. These have all started since his dental procedure. Autoimmune work up has been unrevealing up to this point. He has also had several tick bites during this year. 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 Thought that his infection was from a mosquito.   - continue adderall.   FEN/GI: saline lock, Prophylaxis: lovenox   Disposition:  Med-surg   Subjective:  Still states that he is having hot flushes throughout. He would like to have imaging, however would like to do it as an outpatient.   Objective: Temp:  [97.4 F (36.3  C)-98.3 F (36.8 C)] 97.4 F (36.3 C) (05/05 0533) Pulse Rate:  [48-69] 48 (05/05 0533) Resp:  [10-21] 20 (05/05 0533) BP: (92-154)/(54-96) 125/67 mmHg (05/05 0533) SpO2:  [99 %-100 %] 100 % (05/05 0533) Weight:  [175 lb (79.379 kg)] 175 lb (79.379 kg) (05/04 1118)  Physical Exam: General: Patient lying in bed, NAD Cardiovascular: RRR, no murmurs  Respiratory: CTAB, no wheezes  Abdomen: BS+, no ttp, no distention    Laboratory:  Recent Labs Lab 09/04/15 0140 09/04/15 0944 09/06/15 0644  WBC 6.3 4.7 6.3  HGB 13.4 12.6* 13.9  HCT 39.4 37.9* 41.8  PLT 289 244 259    Recent Labs Lab 09/04/15 0140 09/05/15 0529 09/06/15 0751  NA 141 139 140  K 4.5 4.5 4.2  CL 105 105 108  CO2 27 25 21*  BUN 18 15 15   CREATININE 1.09 0.90 0.74  CALCIUM 9.6 9.5 9.1  GLUCOSE 117* 101* 93    Imaging/Diagnostic Tests: No results found.  Urvi Imes Cletis Media, MD 09/07/2015, 7:05 AM PGY-1, [place] Intern pager: [phone], text pages welcome

## 2015-09-07 NOTE — Discharge Instructions (Signed)
You were admitted for work up for a serious infection called endocarditis and due to presence a positive blood culture. Your work up was negative for endocarditis, and it was thought that organism found in your blood was not an infectious organisms but something commonly noted to be on skin. It was thought that this was not likely to need further antibiotic treatment. You will need to follow up with your primary care physician to do the possibly imaging as discussed.

## 2015-09-08 LAB — CULTURE, BLOOD (ROUTINE X 2)
CULTURE: NO GROWTH
Culture: NO GROWTH

## 2015-09-11 ENCOUNTER — Telehealth: Payer: Self-pay

## 2015-09-11 ENCOUNTER — Other Ambulatory Visit: Payer: Self-pay | Admitting: Internal Medicine

## 2015-09-11 ENCOUNTER — Inpatient Hospital Stay: Payer: BLUE CROSS/BLUE SHIELD | Admitting: Family Medicine

## 2015-09-11 NOTE — Telephone Encounter (Signed)
Bess Harvest check it and it was legit. Disregard message. Thanks.

## 2015-09-11 NOTE — Telephone Encounter (Signed)
Can we check a database to see if Rx was filled? Let Dr. [name] know or let me know if it is ok to call in. Just want to make sure.

## 2015-09-11 NOTE — Telephone Encounter (Signed)
[facility] in [place] called in stating that the patients wife brought in a "copy" of the 06/20/15 LORazepam (ATIVAN) 2 MG tablet rx. States that she gave the original to the pharmacy but they lost it, the pharmacist states they do not have it and haven't had it. She wanted to know if we could resend it to them so they could fill it, I spoke with Pamala Hurry and she told me to put a message in because it sounded a little suspicious.   The phone number to the pharmacy is [phone] if you would like to call and speak to them.

## 2015-09-13 ENCOUNTER — Other Ambulatory Visit: Payer: Self-pay | Admitting: Family Medicine

## 2015-09-13 DIAGNOSIS — T148 Other injury of unspecified body region: Secondary | ICD-10-CM | POA: Diagnosis not present

## 2015-09-13 DIAGNOSIS — S30860S Insect bite (nonvenomous) of lower back and pelvis, sequela: Secondary | ICD-10-CM | POA: Diagnosis not present

## 2015-09-13 DIAGNOSIS — B999 Unspecified infectious disease: Secondary | ICD-10-CM | POA: Diagnosis not present

## 2015-09-13 DIAGNOSIS — L819 Disorder of pigmentation, unspecified: Secondary | ICD-10-CM

## 2015-09-13 DIAGNOSIS — S069X0S Unspecified intracranial injury without loss of consciousness, sequela: Secondary | ICD-10-CM | POA: Diagnosis not present

## 2015-09-13 DIAGNOSIS — G2581 Restless legs syndrome: Secondary | ICD-10-CM

## 2015-09-13 DIAGNOSIS — I7300 Raynaud's syndrome without gangrene: Secondary | ICD-10-CM

## 2015-09-13 DIAGNOSIS — M1990 Unspecified osteoarthritis, unspecified site: Secondary | ICD-10-CM | POA: Diagnosis not present

## 2015-09-13 HISTORY — DX: Restless legs syndrome: G25.81

## 2015-09-13 HISTORY — DX: Raynaud's syndrome without gangrene: I73.00

## 2015-09-17 ENCOUNTER — Ambulatory Visit
Admission: RE | Admit: 2015-09-17 | Discharge: 2015-09-17 | Disposition: A | Discharge status: Home / Self Care | Payer: BLUE CROSS/BLUE SHIELD | Source: Ambulatory Visit | Attending: Family Medicine | Admitting: Family Medicine

## 2015-09-17 ENCOUNTER — Other Ambulatory Visit: Payer: Self-pay | Admitting: Family Medicine

## 2015-09-17 DIAGNOSIS — R202 Paresthesia of skin: Secondary | ICD-10-CM | POA: Diagnosis not present

## 2015-09-17 DIAGNOSIS — G2581 Restless legs syndrome: Secondary | ICD-10-CM

## 2015-09-17 DIAGNOSIS — L819 Disorder of pigmentation, unspecified: Secondary | ICD-10-CM

## 2015-09-18 ENCOUNTER — Telehealth: Payer: Self-pay | Admitting: Family Medicine

## 2015-09-18 ENCOUNTER — Telehealth: Payer: Self-pay | Admitting: Neurology

## 2015-09-18 ENCOUNTER — Ambulatory Visit
Admission: RE | Admit: 2015-09-18 | Discharge: 2015-09-18 | Disposition: A | Discharge status: Home / Self Care | Payer: BLUE CROSS/BLUE SHIELD | Source: Ambulatory Visit | Attending: Family Medicine | Admitting: Family Medicine

## 2015-09-18 DIAGNOSIS — G2581 Restless legs syndrome: Secondary | ICD-10-CM | POA: Diagnosis not present

## 2015-09-18 LAB — CULTURE, BLOOD (ROUTINE X 2)
CULTURE: NO GROWTH
CULTURE: NO GROWTH

## 2015-09-18 LAB — CULTURE, BLOOD (SINGLE): CULTURE: NO GROWTH

## 2015-09-18 NOTE — Telephone Encounter (Signed)
received a paper referral From Dr. 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 When I called patient to sched the appointment with Dr. Rexene Alberts patient stated that he does not want to see Dr. Rexene Alberts he is wanting apt with Dr. [name]. I explained to pt that he is already established care with Dr. Rexene Alberts and that he will need to see her again, pt got very upset and instisst on being scheduled with Dr. [name]. Explained to patient that I would have to speak with the provider and will let him know. Please advise.

## 2015-09-18 NOTE — Telephone Encounter (Signed)
I am OK. Larey Seat, MD

## 2015-09-18 NOTE — Telephone Encounter (Signed)
Okay with me 

## 2015-09-18 NOTE — Telephone Encounter (Signed)
babs or Kennyth Lose I had accepted this patientinto my practice---I was going to see him tomorrow at 1;30--it does not look like I actually got him scheduled (or he changed his mnd and cancelled!) can u call and see? If he is planning on comiing can u change him to 1:45 and schedule him If he has changed his mind no problem THANKS! Dorcas Mcmurray

## 2015-09-18 NOTE — Telephone Encounter (Signed)
babs called him and he does not want to come here

## 2015-09-19 ENCOUNTER — Ambulatory Visit: Payer: BLUE CROSS/BLUE SHIELD | Admitting: Family Medicine

## 2015-09-25 DIAGNOSIS — A770 Spotted fever due to Rickettsia rickettsii: Secondary | ICD-10-CM | POA: Diagnosis not present

## 2015-09-25 DIAGNOSIS — E291 Testicular hypofunction: Secondary | ICD-10-CM | POA: Diagnosis not present

## 2015-09-25 DIAGNOSIS — R5381 Other malaise: Secondary | ICD-10-CM | POA: Diagnosis not present

## 2015-09-25 DIAGNOSIS — R4189 Other symptoms and signs involving cognitive functions and awareness: Secondary | ICD-10-CM | POA: Diagnosis not present

## 2015-09-27 DIAGNOSIS — E230 Hypopituitarism: Secondary | ICD-10-CM | POA: Diagnosis not present

## 2015-09-27 DIAGNOSIS — S069X0S Unspecified intracranial injury without loss of consciousness, sequela: Secondary | ICD-10-CM | POA: Diagnosis not present

## 2015-09-27 DIAGNOSIS — F900 Attention-deficit hyperactivity disorder, predominantly inattentive type: Secondary | ICD-10-CM | POA: Diagnosis not present

## 2015-09-27 DIAGNOSIS — G2581 Restless legs syndrome: Secondary | ICD-10-CM | POA: Diagnosis not present

## 2015-10-04 ENCOUNTER — Ambulatory Visit: Payer: BLUE CROSS/BLUE SHIELD | Admitting: Physician Assistant

## 2015-10-08 ENCOUNTER — Encounter: Payer: Self-pay | Admitting: Neurology

## 2015-10-08 ENCOUNTER — Ambulatory Visit (INDEPENDENT_AMBULATORY_CARE_PROVIDER_SITE_OTHER): Payer: BLUE CROSS/BLUE SHIELD | Admitting: Neurology

## 2015-10-08 VITALS — BP 120/84 | HR 76 | Resp 20 | Ht 73.0 in | Wt 178.0 lb

## 2015-10-08 DIAGNOSIS — E291 Testicular hypofunction: Secondary | ICD-10-CM

## 2015-10-08 DIAGNOSIS — G9389 Other specified disorders of brain: Secondary | ICD-10-CM | POA: Diagnosis not present

## 2015-10-08 DIAGNOSIS — A770 Spotted fever due to Rickettsia rickettsii: Secondary | ICD-10-CM

## 2015-10-08 DIAGNOSIS — R579 Shock, unspecified: Secondary | ICD-10-CM

## 2015-10-08 DIAGNOSIS — F0781 Postconcussional syndrome: Secondary | ICD-10-CM | POA: Diagnosis not present

## 2015-10-08 DIAGNOSIS — I7300 Raynaud's syndrome without gangrene: Secondary | ICD-10-CM | POA: Insufficient documentation

## 2015-10-08 NOTE — Patient Instructions (Addendum)
[place] Spotted Fever [place] spotted fever is an illness that is spread to people by infected ticks. The illness causes flulike symptoms and a reddish-purple rash. This illness can quickly become very serious. Treatment must be started right away. When the illness is not treated right away, it can sometimes lead to long-term health problems or even death. This illness is most common during warm weather when ticks are most active. CAUSES [facility] spotted fever is caused by a type of bacteria that is called Rickettsia rickettsii. This type of bacteria is carried by [place] dog ticks and [employer]. People get infected through a bite from a tick that is infected with the bacteria. The bite is painless, and it frequently goes unnoticed. The bacteria can also infect a person when tick blood or tick feces get into a person's body through damaged skin. A tick bite is not necessary for an infection to occur. People can get [facility] spotted fever if they get a tick's blood or body fluids on their skin in the area of a small cut or sore. This could happen while removing a tick from another person or a dog. The infection is not contagious, and it cannot be spread (transmitted) from person to person. SIGNS AND SYMPTOMS Symptoms may begin 2-14 days after a tick bite. The most common early symptoms are:  Fever.  Muscle aches.  Headache.  Nausea.  Vomiting.  Poor appetite.  Abdominal pain. The reddish-purple rash usually appears 3-5 days after the first symptoms begin. The rash often starts on the wrists and ankles. It may then spread to the palms, the soles of the feet, the legs, and the trunk. DIAGNOSIS Diagnosis is based on a physical exam, medical history, and blood tests. Your health care provider may suspect [facility] spotted fever in one of these cases:   If you have recently been bitten by a tick.  If you have been in areas that have a lot of ticks  or in areas where the disease is common. TREATMENT It is important to begin treatment right away. Treatment will usually involve the use of antibiotic medicines. In some cases, your health care provider may begin treatment before the diagnosis is confirmed. If your symptoms are severe, a hospital stay may be needed. HOME CARE INSTRUCTIONS  Rest as much as possible until you feel better.  Take medicines only as directed by your health care provider.  Take your antibiotic medicine as directed by your health care provider. Finish the antibiotic even if you start to feel better.  Drink enough fluid to keep your urine clear or pale yellow.  Keep all follow-up visits as directed by your health care provider. This is important. PREVENTION Avoiding tick bites can help to prevent this illness. Take these steps to avoid tick bites when you are outdoors:  Be aware that most ticks live in shrubs, low tree branches, and grassy areas. A tick can climb onto your body when you make contact with leaves or grass where the tick is waiting.  Wear protective clothing. Long sleeves and long pants are best.  Wear white clothes so you can see ticks more easily.  Tuck your pant legs into your socks.  If you go walking on a trail, stay in the middle of the trail to avoid brushing against bushes.  Avoid walking through areas that have long grass.  Put insect repellent on all exposed skin and along boot tops, pant legs, and sleeve cuffs.  Check clothing, hair, and skin repeatedly and before going inside.  Check family members and pets for ticks.  Brush off any ticks that are not attached.  Take a shower or a bath as soon as possible after you have been outdoors. Check your skin for ticks. The most common places on the body where ticks attach themselves are the scalp, neck, armpits, waist, and groin. You can also greatly reduce your chances of getting [facility] spotted fever if you remove attached  ticks as soon as possible. To remove an attached tick, use a forceps or fine-point tweezers to detach the intact tick without leaving its mouth parts in the skin. The wound from the tick bite should be washed after the tick has been removed. SEEK MEDICAL CARE IF:  You have drainage, swelling, or increased redness or pain in the area of the rash. SEEK IMMEDIATE MEDICAL CARE IF:  You have chest pain.  You have shortness of breath.  You have a severe headache.  You have a seizure.  You have severe abdominal pain.  You are feeling confused.  You are bruising easily.  You have bleeding from your gums.  You have blood in your stool.   This information is not intended to replace advice given to you by your health care provider. Make sure you discuss any questions you have with your health care provider.   Document Released: 08/03/2000 Document Revised: 05/12/2014 Document Reviewed: 12/05/2013 Elsevier Interactive Patient Education 2016 Elsevier Inc. Testosterone Testosterone is a hormone made by the male's testicles and by the adrenal glands, which are a pair of glands on top of the kidneys. Starting at puberty, testosterone stimulates the development of secondary sex characteristics. This includes a deeper voice, growth of muscles and body hair, and penis enlargement.  Females also produce testosterone in both the adrenal glands and ovaries. A male's body converts testosterone into estradiol, the main male sex hormone. An abnormal level of testosterone can cause health issues in both males and females. You may have this test if your health care provider suspects that an abnormal testosterone level is causing or contributing to other health problems. In males, symptoms of an abnormal testosterone level include:  Infertility.  Erectile dysfunction.  Delayed puberty or premature puberty. In females, symptoms of an abnormally high testosterone level  include:  Infertility.  Polycystic ovarian syndrome (PCOS).  Developing masculine features (virilization). This test requires a blood sample taken from a vein in your arm or hand. The sample for this test is usually collected in the morning. The amount of testosterone in your blood is highest at that time. RESULTS It is your responsibility to obtain your test results. Ask the lab or department performing the test when and how you will get your results. Contact your health care provider to discuss any questions you have about your results.  The result of a blood test for testosterone will be given as a range of values. A testosterone level that is outside the normal range may indicate a health problem. Testosterone is measured in nanograms per deciliter (ng/dL). Range of Normal Values Ranges for normal values may vary among different labs and hospitals. You should always check with your health care provider after having lab work or other tests done to discuss whether your values are considered within normal limits. Normal levels of total testosterone are as follows:  Male:  7 months to 57 years old: less than 30 ng/dL.  37-81 years old: less than 300 ng/dL.  11-13 years old: 170-540 ng/dL.  44-53 years old: 250-910 ng/dL.  58 years old and over: 280-1,080 ng/dL.  Male:  7 months to 58 years old: less than 30 ng/dL.  2-7 years old: less than 40 ng/dL.  41-71 years old: less than 60 ng/dL.  76-47 years old: less than 70 ng/dL.  57 years old and over: less than 70 ng/dL. Meaning of Results Outside Normal Value Ranges A testosterone level that is too low or too high can indicate a number of health problems. In males:  A high testosterone level can occur if you:  Have certain types of tumors.  Have an overactive thyroid gland (hyperthyroidism).  Use anabolic steroids.  Are starting puberty early (precocious puberty).  Have an inherited disorder that affects the adrenal  glands (congenital adrenal hyperplasia).  A low testosterone level can occur if you:  Have certain genetic diseases.  Have had certain viral infections, such as mumps.  Have pituitary disease.  Have had an injury to the testicles.  Are an alcoholic. In females:  A high testosterone level can occur if you have:  Certain types of tumors.  An inherited disorder that affects certain cells in the adrenal glands (congenital adrenocortical hyperplasia).  PCOS.  A low testosterone level does not cause health problems. Discuss the results of your testosterone test with your health care provider. Your health care provider will use the results of this test and other tests to make a diagnosis.   This information is not intended to replace advice given to you by your health care provider. Make sure you discuss any questions you have with your health care provider.   Document Released: 05/08/2004 Document Revised: 05/12/2014 Document Reviewed: 08/17/2013 Elsevier Interactive Patient Education [employer].
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
## 2015-10-09 ENCOUNTER — Telehealth: Payer: Self-pay | Admitting: Neurology

## 2015-10-09 ENCOUNTER — Telehealth: Payer: Self-pay

## 2015-10-09 DIAGNOSIS — F988 Other specified behavioral and emotional disorders with onset usually occurring in childhood and adolescence: Secondary | ICD-10-CM

## 2015-10-09 LAB — HEPATITIS C ANTIBODY (REFLEX)

## 2015-10-09 LAB — HCV COMMENT:

## 2015-10-09 NOTE — Telephone Encounter (Signed)
Phillip [name] is calling to see if Dr. [name] can write the Rx Adderell 30 mg for this patient as Dr. Jennell Corner has retired. [name] states his insurance(BC[employer]) requires the [employer].

## 2015-10-09 NOTE — Telephone Encounter (Signed)
[name] (patient's friend) is calling to request a refill for adderral.  [phone] or [phone]

## 2015-10-10 MED ORDER — ADDERALL 30 MG PO TABS: 30.0000 mg | ORAL_TABLET | Freq: Three times a day (TID) | ORAL | Status: DC

## 2015-10-10 NOTE — Telephone Encounter (Signed)
I called Sherrilee Gilles (per DPR) to advise her that the RX for adderall is ready for pick up at the front desk.  No answer, left a message asking her to call me back. If pt's friend calls back, please advise her of this information.

## 2015-10-10 NOTE — Telephone Encounter (Signed)
Sherrilee Gilles returned Kristen's call. msg relayed

## 2015-10-11 ENCOUNTER — Encounter: Payer: Self-pay | Admitting: Vascular Surgery

## 2015-10-11 ENCOUNTER — Telehealth: Payer: Self-pay

## 2015-10-11 NOTE — Telephone Encounter (Signed)
Noted, thank you

## 2015-10-11 NOTE — Telephone Encounter (Signed)
I called pt to advise him that per Dr. [name], his labs showed no hepatitis infection present. Left a message on cell number asking pt to call me back.

## 2015-10-11 NOTE — Telephone Encounter (Signed)
-----   Message from Larey Seat, MD sent at 10/10/2015  5:26 PM EDT ----- No hepatitis infection present.

## 2015-10-11 NOTE — Telephone Encounter (Signed)
LMOM for pt that we got Karen's message but I see Dr [name] has written this for him yesterday. Reminded him to f/up to est w/ new PCP.

## 2015-10-11 NOTE — Telephone Encounter (Signed)
Pt returned Kristen's call. Message relayed to wife, she relayed to husband. They were apprevciative

## 2015-10-16 ENCOUNTER — Telehealth: Payer: Self-pay | Admitting: Neurology

## 2015-10-16 NOTE — Telephone Encounter (Signed)
Patient's wife is calling about the scheduling of her husband's MRI.  Please call.

## 2015-10-17 ENCOUNTER — Encounter: Payer: Self-pay | Admitting: Vascular Surgery

## 2015-10-17 ENCOUNTER — Ambulatory Visit (INDEPENDENT_AMBULATORY_CARE_PROVIDER_SITE_OTHER): Payer: BLUE CROSS/BLUE SHIELD | Admitting: Vascular Surgery

## 2015-10-17 VITALS — BP 128/72 | HR 76 | Ht 73.0 in | Wt 181.5 lb

## 2015-10-17 DIAGNOSIS — I7300 Raynaud's syndrome without gangrene: Secondary | ICD-10-CM | POA: Diagnosis not present

## 2015-10-17 DIAGNOSIS — M79603 Pain in arm, unspecified: Secondary | ICD-10-CM | POA: Diagnosis not present

## 2015-10-17 NOTE — Telephone Encounter (Signed)
Danielle asked me to make sure that you completed the peer to peer for this pt's MRI. She had not heard back from you regarding this. She sent you a staff message 2 days ago.  Per Danielle's staff message to you, "This patients MRI is pending a P2P the phone number is [phone] before June 14th. Thank you!"

## 2015-10-17 NOTE — Telephone Encounter (Signed)
The patient's study has been preauthorized authorization [id number]. Valid until 11-13-15, CD

## 2015-10-17 NOTE — Telephone Encounter (Signed)
This patient needed a Peer 2 Peer that I had sent to Dr. [name], do you know if it was completed?

## 2015-10-17 NOTE — Telephone Encounter (Signed)
Spoke with [name] who informed me [place] had called to schedule.
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
## 2015-10-22 DIAGNOSIS — E291 Testicular hypofunction: Secondary | ICD-10-CM | POA: Diagnosis not present

## 2015-10-22 DIAGNOSIS — I7300 Raynaud's syndrome without gangrene: Secondary | ICD-10-CM | POA: Diagnosis not present

## 2015-10-22 DIAGNOSIS — A770 Spotted fever due to Rickettsia rickettsii: Secondary | ICD-10-CM | POA: Diagnosis not present

## 2015-10-22 DIAGNOSIS — M2550 Pain in unspecified joint: Secondary | ICD-10-CM | POA: Diagnosis not present

## 2015-10-23 ENCOUNTER — Ambulatory Visit
Admission: RE | Admit: 2015-10-23 | Discharge: 2015-10-23 | Disposition: A | Discharge status: Home / Self Care | Payer: BLUE CROSS/BLUE SHIELD | Source: Ambulatory Visit | Attending: Neurology | Admitting: Neurology

## 2015-10-23 ENCOUNTER — Telehealth: Payer: Self-pay | Admitting: Neurology

## 2015-10-23 DIAGNOSIS — A770 Spotted fever due to Rickettsia rickettsii: Secondary | ICD-10-CM | POA: Diagnosis not present

## 2015-10-23 DIAGNOSIS — E291 Testicular hypofunction: Secondary | ICD-10-CM | POA: Diagnosis not present

## 2015-10-23 DIAGNOSIS — G9389 Other specified disorders of brain: Secondary | ICD-10-CM | POA: Diagnosis not present

## 2015-10-23 DIAGNOSIS — R579 Shock, unspecified: Secondary | ICD-10-CM | POA: Diagnosis not present

## 2015-10-23 DIAGNOSIS — F0781 Postconcussional syndrome: Secondary | ICD-10-CM

## 2015-10-23 DIAGNOSIS — D352 Benign neoplasm of pituitary gland: Secondary | ICD-10-CM

## 2015-10-23 MED ORDER — GADOBENATE DIMEGLUMINE 529 MG/ML IV SOLN
8.0000 mL | Freq: Once | INTRAVENOUS | Status: AC | PRN
  Administered 2015-10-23: 8 mL via INTRAVENOUS

## 2015-10-23 NOTE — Telephone Encounter (Signed)
Pt's wife called and wanted to know if a referral was put in for an Endocrinology. Please call and advise  She says Dr. [name] was supposed to confer with his primary.

## 2015-10-23 NOTE — Telephone Encounter (Signed)
Dr. [name], do you want a referral to endocrinology and did you confer with Dr. Ernie Hew, pt's PCP?

## 2015-10-23 NOTE — Telephone Encounter (Signed)
Please ask Dr Ernie Hew for her recommendations.

## 2015-10-24 NOTE — Telephone Encounter (Signed)
I called Phillip [name] back to discuss. No answer, left a message asking her to call me back.

## 2015-10-25 NOTE — Telephone Encounter (Signed)
I spoke to Phillip [name] and told her a endocrinology referral was placed by Dr. [name]. That office will contact them for an appt. Phillip [name] verbalized understanding.  [name], will you make sure this referral goes to Dr. Renne Crigler in endocrinology? Thanks!

## 2015-10-25 NOTE — Telephone Encounter (Signed)
I spoke to Phillip [name]. She reports that Dr. Ernie Hew refused to refer pt to endocrinology and told them that "it was not necessary". Ms. [name] reports that Dr. [name] told pt that it was "very important" for pt to see endocrinology, therefore Dr. [name] should place the order.  Pt's friend reports that pt's condition "is worsening". She reports that last night, one of pt's legs were white, while the other was red. She is very concerned.  Pt's friend is asking that Dr. [name] place the referral to endocrinology because "everywhere we turn we get dead ends."

## 2015-10-25 NOTE — Addendum Note (Signed)
Addended by: Larey Seat on: 10/25/2015 01:51 PM   Modules accepted: Orders

## 2015-10-25 NOTE — Telephone Encounter (Signed)
Ms. Phillip [name] is calling back and would like to talk with you. Thanks!

## 2015-10-25 NOTE — Telephone Encounter (Signed)
Will ask dr Cruzita Lederer to see patient.

## 2015-10-29 ENCOUNTER — Telehealth: Payer: Self-pay | Admitting: Neurology

## 2015-10-29 NOTE — Telephone Encounter (Signed)
Sent via Epic to Dr. Renne Crigler endocrinology .

## 2015-10-29 NOTE — Telephone Encounter (Signed)
I have not received any result notes for the MRI brain that was performed on 10/24/2015. Dr. [name] is now out of the office for 2 weeks. I will send this request to the WID to review and advise me on what to tell the pt regarding his MRI results.

## 2015-10-29 NOTE — Telephone Encounter (Signed)
Phillip [name] , friend, called for Phillip [name] asking for MRI results. Please call and advise

## 2015-10-29 NOTE — Telephone Encounter (Signed)
I called patient, I discussed the MRI results within. Pituitary gland appears to be normal, otherwise chronic changes in the right anterior temporal lobe, minimal white matter changes otherwise.   MRI brain 10/24/15:  IMPRESSION: This MRI of the brain with and without contrast shows the following: 1. 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  2. Mild generalized cortical atrophy. 3. Scattered T2/FLAIR hyperintense foci consistent with chronic microvascular ischemic change. The extent is more than expected for age. 4. The pituitary gland appears normal. 5. There are no acute findings.

## 2015-11-08 DIAGNOSIS — A770 Spotted fever due to Rickettsia rickettsii: Secondary | ICD-10-CM | POA: Diagnosis not present

## 2015-11-08 DIAGNOSIS — R4189 Other symptoms and signs involving cognitive functions and awareness: Secondary | ICD-10-CM | POA: Diagnosis not present

## 2015-11-08 DIAGNOSIS — E291 Testicular hypofunction: Secondary | ICD-10-CM | POA: Diagnosis not present

## 2015-11-08 DIAGNOSIS — R5381 Other malaise: Secondary | ICD-10-CM | POA: Diagnosis not present

## 2015-11-09 DIAGNOSIS — E291 Testicular hypofunction: Secondary | ICD-10-CM | POA: Diagnosis not present

## 2015-11-12 ENCOUNTER — Other Ambulatory Visit: Payer: Self-pay | Admitting: Neurology

## 2015-11-12 DIAGNOSIS — H52203 Unspecified astigmatism, bilateral: Secondary | ICD-10-CM | POA: Diagnosis not present

## 2015-11-12 DIAGNOSIS — F988 Other specified behavioral and emotional disorders with onset usually occurring in childhood and adolescence: Secondary | ICD-10-CM

## 2015-11-12 DIAGNOSIS — H43811 Vitreous degeneration, right eye: Secondary | ICD-10-CM | POA: Diagnosis not present

## 2015-11-12 MED ORDER — ADDERALL 30 MG PO TABS: 30.0000 mg | ORAL_TABLET | Freq: Three times a day (TID) | ORAL | Status: DC

## 2015-11-12 NOTE — Telephone Encounter (Signed)
Patient's wife is calling to order a written Rx Adderall 30 mg and needs that today if possible.  Thanks!

## 2015-11-12 NOTE — Telephone Encounter (Signed)
I called pt's friend, Sherrilee Gilles, per [facility]. Pt's RX for adderall is ready at the front desk for pick up.  No answer, left a message asking her to call me back. If Ms.Ramey calls back, please advise her of this information.

## 2015-11-20 DIAGNOSIS — E230 Hypopituitarism: Secondary | ICD-10-CM | POA: Diagnosis not present

## 2015-11-20 DIAGNOSIS — I7300 Raynaud's syndrome without gangrene: Secondary | ICD-10-CM | POA: Diagnosis not present

## 2015-11-20 DIAGNOSIS — S069X0S Unspecified intracranial injury without loss of consciousness, sequela: Secondary | ICD-10-CM | POA: Diagnosis not present

## 2015-11-20 DIAGNOSIS — Z6824 Body mass index (BMI) 24.0-24.9, adult: Secondary | ICD-10-CM | POA: Diagnosis not present

## 2015-11-26 ENCOUNTER — Ambulatory Visit (INDEPENDENT_AMBULATORY_CARE_PROVIDER_SITE_OTHER): Payer: BLUE CROSS/BLUE SHIELD | Admitting: Neurology

## 2015-11-26 ENCOUNTER — Encounter: Payer: Self-pay | Admitting: Neurology

## 2015-11-26 VITALS — BP 124/77 | HR 74 | Ht 73.0 in | Wt 185.5 lb

## 2015-11-26 DIAGNOSIS — F909 Attention-deficit hyperactivity disorder, unspecified type: Secondary | ICD-10-CM

## 2015-11-26 DIAGNOSIS — S0990XS Unspecified injury of head, sequela: Secondary | ICD-10-CM | POA: Diagnosis not present

## 2015-11-26 DIAGNOSIS — F988 Other specified behavioral and emotional disorders with onset usually occurring in childhood and adolescence: Secondary | ICD-10-CM

## 2015-11-26 DIAGNOSIS — R5383 Other fatigue: Secondary | ICD-10-CM | POA: Diagnosis not present

## 2015-11-26 DIAGNOSIS — G0490 Encephalitis and encephalomyelitis, unspecified: Secondary | ICD-10-CM | POA: Diagnosis not present

## 2015-11-26 DIAGNOSIS — G9389 Other specified disorders of brain: Secondary | ICD-10-CM

## 2015-11-26 MED ORDER — ADDERALL 30 MG PO TABS: 30.0000 mg | ORAL_TABLET | Freq: Two times a day (BID) | ORAL | 0 refills | Status: DC

## 2015-11-26 NOTE — Addendum Note (Signed)
Addended by: Larey Seat on: 11/26/2015 04:20 PM   Modules accepted: Orders
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
## 2015-11-26 NOTE — Patient Instructions (Signed)
Fatigue  Fatigue is feeling tired all of the time, a lack of energy, or a lack of motivation. Occasional or mild fatigue is often a normal response to activity or life in general. However, long-lasting (chronic) or extreme fatigue may indicate an underlying medical condition.  HOME CARE INSTRUCTIONS   Watch your fatigue for any changes. The following actions may help to lessen any discomfort you are feeling:  · Talk to your health care provider about how much sleep you need each night. Try to get the required amount every night.  · Take medicines only as directed by your health care provider.  · Eat a healthy and nutritious diet. Ask your health care provider if you need help changing your diet.  · Drink enough fluid to keep your urine clear or pale yellow.  · Practice ways of relaxing, such as yoga, meditation, massage therapy, or acupuncture.  · Exercise regularly.    · Change situations that cause you stress. Try to keep your work and personal routine reasonable.  · Do not abuse illegal drugs.  · Limit alcohol intake to no more than 1 drink per day for nonpregnant women and 2 drinks per day for men. One drink equals 12 ounces of beer, 5 ounces of wine, or 1½ ounces of hard liquor.  · Take a multivitamin, if directed by your health care provider.  SEEK MEDICAL CARE IF:   · Your fatigue does not get better.  · You have a fever.    · You have unintentional weight loss or gain.  · You have headaches.    · You have difficulty:      Falling asleep.    Sleeping throughout the night.  · You feel angry, guilty, anxious, or sad.     · You are unable to have a bowel movement (constipation).    · You skin is dry.     · Your legs or another part of your body is swollen.    SEEK IMMEDIATE MEDICAL CARE IF:   · You feel confused.    · Your vision is blurry.  · You feel faint or pass out.    · You have a severe headache.    · You have severe abdominal, pelvic, or back pain.    · You have chest pain, shortness of breath, or an  irregular or fast heartbeat.    · You are unable to urinate or you urinate less than normal.    · You develop abnormal bleeding, such as bleeding from the rectum, vagina, nose, lungs, or nipples.  · You vomit blood.     · You have thoughts about harming yourself or committing suicide.    · You are worried that you might harm someone else.       This information is not intended to replace advice given to you by your health care provider. Make sure you discuss any questions you have with your health care provider.     Document Released: 02/16/2007 Document Revised: 05/12/2014 Document Reviewed: 08/23/2013  Elsevier Interactive Patient Education ©2016 Elsevier Inc.

## 2015-12-10 ENCOUNTER — Other Ambulatory Visit: Payer: Self-pay | Admitting: Neurology

## 2015-12-10 NOTE — Telephone Encounter (Signed)
Dr. [name] does not fill this medication. Appears he has run out early as well, not taking as prescribed. I can't fill this medication for him. Dr [name] can address when she gets back.

## 2015-12-10 NOTE — Telephone Encounter (Signed)
Patient's caregiver is calling and states that the patient's PCP Dr. Rachell Cipro has refused to write any of the patient's Rx and would like for Dr. [name] to handle these.  She says that the patient has been out of Rx lorazepam 2 mg tablets since 12/08/15 and that he depends on this medication to sleep.  She is requesting this medication be filled and sent to [facility], [address] [place].  Please call her on her cell [phone] or [phone].  Thanks!

## 2015-12-10 NOTE — Telephone Encounter (Signed)
Dr Jaynee Eagles- This is a Dr Dohmeier patient. Please advise. Looks like a Dr [name] refilled on 06/20/15 with 5 refills, which should have lasted him until 8/15.

## 2015-12-10 NOTE — Telephone Encounter (Signed)
Dr [name]- please advise  Phillip [name] called office back. I relayed Dr Jaynee Eagles message. I advised we cannot refill at this time. We are not prescribing doctor. I advised a refill was given by Dr [name] on 06/20/15 with 5 refills. That is was our system shows. She states there was a big mix up and she is going to try and get to the bottom of this. She understands Dr [name] is out of the office today. Advised she should return tomorrow. She wrote down our names and states she will call back with any updated information. I advised I will send message to Dr [name], but that is not a guarantee she will refill the lorazepam. She verbalized understanding.

## 2015-12-10 NOTE — Telephone Encounter (Signed)
Tried calling [name] back. Gave [facility] phone number.  Please relay Dr Jaynee Eagles message if she calls back.

## 2015-12-11 ENCOUNTER — Other Ambulatory Visit: Payer: Self-pay | Admitting: Neurology

## 2015-12-11 MED ORDER — LORAZEPAM 2 MG PO TABS: 2.0000 mg | ORAL_TABLET | Freq: Every day | ORAL | 5 refills | Status: DC

## 2015-12-11 MED ORDER — LORAZEPAM 1 MG PO TABS: 2.0000 mg | ORAL_TABLET | Freq: Every day | ORAL | 5 refills | Status: DC

## 2015-12-11 NOTE — Telephone Encounter (Addendum)
[name] called to advise, "she knows where the mix up is, there's an issue with PCP writing another Rx for Lorazepam, pharmacy does have one on file from Dr. [name] that patient has not used, the reason he has not used is [facility] [place] doesn't [facility] correct brand he can take (2mg  tablet), pharmacy said doctor can call in 1mg  tablet of [place] brand and cancel the one by Dr. [name]. [name] requests call back.

## 2015-12-11 NOTE — Addendum Note (Signed)
Addended by: Lester Fulshear A on: 12/11/2015 05:24 PM   Modules accepted: Orders

## 2015-12-11 NOTE — Telephone Encounter (Signed)
I spoke to pt's friend, [name], per [facility].  She says that the [facility] not [facility] "[place] Brand lorazepam 2mg  tablets". She wants Dr. [name] to change the RX to lorazepam 1mg  take 2 tablets at bedtime."  Dr. [name], do you want to change this RX? It must say lorazepam 1mg , watson brand."

## 2015-12-11 NOTE — Telephone Encounter (Signed)
Karen/caregiver called to get update on rx. Please call and advise

## 2015-12-11 NOTE — Telephone Encounter (Signed)
This message has already been sent to Dr. [name]. It will be up to Dr. [name] whether she want to take over prescribing the lorazepam and which dose she orders. Dr. [name] has never prescribed the lorazepam and therefore Dr. Jaynee Eagles did not prescribe it in Dr. Edwena Felty absence. Pt already has a valid [facility] but pt's friend claims that it is not the correct brand--ordered by Dr. [name].

## 2015-12-11 NOTE — Addendum Note (Signed)
Addended by: Larey Seat on: 12/11/2015 05:07 PM   Modules accepted: Orders

## 2015-12-12 ENCOUNTER — Ambulatory Visit: Payer: BLUE CROSS/BLUE SHIELD | Admitting: Internal Medicine

## 2015-12-12 MED ORDER — LORAZEPAM 1 MG PO TABS: 2.0000 mg | ORAL_TABLET | Freq: Every day | ORAL | 5 refills | Status: DC

## 2015-12-12 NOTE — Telephone Encounter (Signed)
RX for lorazepam 1 mg #60 [facility] [place]. Received a receipt of confirmation.

## 2015-12-12 NOTE — Addendum Note (Signed)
Addended by: Lester [place] A on: 12/12/2015 08:23 AM   Modules accepted: Orders

## 2015-12-12 NOTE — Telephone Encounter (Signed)
Why is the patient not getting the lorazepam from his previous prescriber?  Was that Dr Ernie Hew don't mind to take it over, but she needs to know about this . CD ?

## 2015-12-12 NOTE — Telephone Encounter (Signed)
I called [name], pt's friend, again to discuss. No answer, left a message asking her to call me back.  RX for lorazepam has been changed to 1mg , take 2 tablets qhs.

## 2015-12-12 NOTE — Addendum Note (Signed)
Addended by: Larey Seat on: 12/12/2015 08:48 AM   Modules accepted: Orders

## 2015-12-12 NOTE — Telephone Encounter (Signed)
Raven from Dr. Ival Bible office returned my call. She advised me that Dr. Ernie Hew has placed pt as "inactive" because of pt's behavior. Pt repeatedly called Dr. Ernie Hew asking for lorazepam prescriptions. Dr. Ernie Hew advised him that he needed to see neurology, but repeatedly did not come to see [place] for a consult. Raven reports that the interactions with pt and Dr. Ernie Hew got "nasty" because of pt's insistence for his lorazepam. I advised Raven that Dr. [name] did refill his lorazepam for him and to please inform Dr. Ernie Hew of this. Likewise, I will inform Dr. [name] of this information.

## 2015-12-12 NOTE — Telephone Encounter (Signed)
I called Dr. Ival Bible office to advise them that Dr. [name] is going to take over prescribing the lorazepam and to find out why they are refusing to prescribe it. I left a message with Raven to have Dr. Ernie Hew or nurse call us back.

## 2015-12-17 DIAGNOSIS — A449 Bartonellosis, unspecified: Secondary | ICD-10-CM | POA: Diagnosis not present

## 2015-12-17 DIAGNOSIS — R4189 Other symptoms and signs involving cognitive functions and awareness: Secondary | ICD-10-CM | POA: Diagnosis not present

## 2015-12-17 DIAGNOSIS — A7741 Ehrlichiosis chafeensis [E. chafeensis]: Secondary | ICD-10-CM | POA: Diagnosis not present

## 2015-12-18 ENCOUNTER — Telehealth: Payer: Self-pay

## 2015-12-18 NOTE — Telephone Encounter (Signed)
-----   Message from Larey Seat, MD sent at 12/17/2015  5:40 PM EDT ----- Comparison read to 2010 study- unchanged. Thanks for bringing this disc !

## 2015-12-18 NOTE — Telephone Encounter (Signed)
I called pt to discuss MRI results. No answer, left a message asking him to call me back. I also need to know if he would like his MRI CD mailed back to him.

## 2015-12-20 NOTE — Telephone Encounter (Signed)
I spoke to [name], pt's friend, per [facility]. I advised her that per Dr. [name], pt's MRI was compared to a 2010 study and it is unchanged. Pt reports that he already saw the MRI report in mychart. Pt and friend ask me to please send the 2010 study CD to MR to be kept with pt's files. Pt and friend ask me about the neuropsychology referral. I advise them that the referral has been sent to Dr. Vikki Ports and I provided them with that office phone number. Pt and friend, [name] verbalized understanding of results. Pt and friend, [name] had no questions at this time but was encouraged to call back if questions arise.

## 2016-01-17 ENCOUNTER — Telehealth: Payer: Self-pay | Admitting: Neurology

## 2016-01-17 DIAGNOSIS — F988 Other specified behavioral and emotional disorders with onset usually occurring in childhood and adolescence: Secondary | ICD-10-CM

## 2016-01-17 DIAGNOSIS — R5383 Other fatigue: Secondary | ICD-10-CM

## 2016-01-17 DIAGNOSIS — S0990XS Unspecified injury of head, sequela: Secondary | ICD-10-CM

## 2016-01-17 MED ORDER — ADDERALL 30 MG PO TABS: 30.0000 mg | ORAL_TABLET | Freq: Two times a day (BID) | ORAL | 0 refills | Status: DC

## 2016-01-17 NOTE — Telephone Encounter (Signed)
Pt's wife request refill for ADDERALL 30 MG tablet . Pt needs to pick up tomorrow. He will run out on Sunday

## 2016-01-17 NOTE — Telephone Encounter (Signed)
I spoke to pt's friend, [name], per [facility]. I advised her that pt's adderall RX is ready for pick up at the front desk. Pt's friend verbalized understanding that clinic is open 8-12 tomorrow.

## 2016-01-22 ENCOUNTER — Ambulatory Visit (INDEPENDENT_AMBULATORY_CARE_PROVIDER_SITE_OTHER): Payer: BLUE CROSS/BLUE SHIELD | Admitting: Internal Medicine

## 2016-01-22 ENCOUNTER — Encounter: Payer: Self-pay | Admitting: Internal Medicine

## 2016-01-22 VITALS — BP 120/84 | HR 89 | Ht 72.5 in | Wt 181.0 lb

## 2016-01-22 DIAGNOSIS — E291 Testicular hypofunction: Secondary | ICD-10-CM

## 2016-01-22 DIAGNOSIS — E230 Hypopituitarism: Secondary | ICD-10-CM | POA: Diagnosis not present

## 2016-01-22 NOTE — Patient Instructions (Signed)
Please come back for labs half-way between injections of testosterone.  Come back on Monday at 8 am, fasting.  Please come back for a follow-up appointment in 6 months.

## 2016-01-22 NOTE — Progress Notes (Addendum)
Patient ID: Phillip [name], male   DOB: 1955/10/05, 60 y.o.   MRN: [id number]   HPI: Phillip [name] is a 60 y.o.-year-old man, referred by his neurologist, Dr. [name], for evaluation for low testosterone. He is here with his friend, who offers part of the history.  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 He had a subsequent visit with his PCP in 09/2015 >> a testosterone level was found to be extremely low (will obtain records).  He was started on testosterone injections 0.5 mL im every week, after which his fatigue improved a little, and he has more energy. He is followed by urology. Last testosterone was normal, reportedly. He is, however, interested in coming off testosterone, if possible.  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 He saw Dr. [name], who obtained a  brain  + pituitary MRI. 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 There was no abnormality in the pituitary gland.  He admits for previous decreased libido >> normal now Had difficulty obtaining or maintaining an erection >> not anymore No trauma to testes, testicular irradiation or surgery No h/o of mumps orchitis/h/o autoimmune ds. No h/o cryptorchidism He grew and went through puberty like his peers + shrinking of testes. No very small testes (<5 ml) No incomplete/delayed sexual development     No breast discomfort/gynecomastia    No loss of body hair (axillary/pubic)/decreased need for shaving No height loss No abnormal sense of smell  + hot flushes, resolved No vision problems, other than blurry vision in R eye, also photopsia - started 11/2015 No worst HA of his life He does have a history of head trauma in 1997, when a tree fell on top of his head  and he lost consciousness for more than 30 minutes; in 2009 he fell off ladder and landed on back of his head No FH of hypogonadism/infertility No personal h/o infertility - has 2 children: 35 and 39 years old  No FH of hemochromatosis or pituitary tumors No excessive weight gain or loss.  No chronic diseases, but recently diagnosed with RMSF Spring 2017 No chronic pain. Not on opiates, does not take steroids, had a course this spring - ~1 week He was drinking Alcohol: ~2 drinks a day up to 2015 >>  stopped completely since then No anabolic steroids use No herbal medicines. Not on antidepressants  No AI ds in his family, no FH of MS. He does not have family history of early cardiac disease.  ROS: Constitutional: no weight gain/loss,  + fatigue,  + subjective hyperthermia Eyes: no blurry vision, no xerophthalmia ENT: no sore throat, no nodules palpated in throat, no dysphagia/odynophagia, no hoarseness Cardiovascular: no CP/SOB/palpitations/leg swelling Respiratory: no cough/SOB Gastrointestinal: no N/V/D/C Musculoskeletal:  + both: muscle/joint aches Skin: no rashes Neurological: no tremors/numbness/tingling/dizziness Psychiatric:+  Depression/no anxiety  Past Medical History:  Diagnosis Date  . ADD (attention deficit disorder)   . Anxiety   . Cellulitis   . Depression   . Encephalitis   . Pneumonia   . [employer] spotted fever   . TBI (traumatic brain injury) [facility])    Past Surgical History:  Procedure Laterality Date  . TEE WITHOUT CARDIOVERSION N/A 09/06/2015   Procedure: TRANSESOPHAGEAL ECHOCARDIOGRAM (TEE);  Surgeon: Jerline Pain, MD;  Location: [place];  Service: Cardiovascular;  Laterality: N/A;  . TONSILLECTOMY     Social History   Social History  . Marital status: Single    Spouse name: N/A  . Number of children: 2   Occupational History  . n/a   Social History Main Topics  . Smoking status: Former Smoker    Packs/day: 1.50    Years: 25.00     Types: Cigarettes    Quit date: 2002  . Smokeless tobacco: Never Used     Comment: Encouraged to remain smoke free  . Alcohol use No     Comment: 8 drinks  . Drug use: No   Social History Narrative   Divorced. Education: [employer].   Current Outpatient Prescriptions on File Prior to Visit  Medication Sig Dispense Refill  . ADDERALL 30 MG tablet Take 1 tablet by mouth 2 (two) times daily. 60 tablet 0  . LORazepam (ATIVAN) 1 MG tablet Take 2 tablets (2 mg total) by mouth at bedtime. 60 tablet 5   No current facility-administered medications on file prior to visit.    Allergies  Allergen Reactions  . Amphetamines     Intolerant to specific formulations: [employer] amphetamine TEVA dextroamp amphetamine  . Lorazepam     Mylan lorazepam  . Methylprednisolone   . Penicillins Hives  . Percocet [Oxycodone-Acetaminophen]     Pt says he is not allergic to this.  . Propofol     Cognitive delay and emotional   Family History  Problem Relation Age of Onset  . Hyperlipidemia Father   . Heart disease Maternal Grandfather   . Cancer Paternal Grandfather   . Mental retardation Mother    PE: BP 120/84 (BP Location: Left Arm, Patient Position: Sitting)   Pulse 89   Ht 6' 0.5" (1.842 m)   Wt 181 lb (82.1 kg)   SpO2 97%   BMI 24.21 kg/m  Wt Readings from Last 3 Encounters:  01/22/16 181 lb (82.1 kg)  11/26/15 185 lb 8 oz (84.1 kg)  10/17/15 181 lb 8 oz (82.3 kg)   Constitutional: Normal weight , in NAD Eyes: PERRLA, EOMI, no exophthalmos ENT: moist mucous membranes, no thyromegaly, no cervical lymphadenopathy Cardiovascular: RRR, No MRG Respiratory: CTA B Gastrointestinal: abdomen soft, NT, ND, BS+ Musculoskeletal: no deformities, strength intact in all 4 Skin: moist, warm, no rashes Neurological: no tremor with outstretched hands, DTR normal in all 4 Genital exam: refuses.  ASSESSMENT: 1. Hypogonadism  PLAN:  1. Hypogonadism I had a long discussion with the pt  and his  friend regarding his  low testosterone  level. I do not have the actual results now, however, patient signed a release of information consent form to obtain these results from his urologist, Dr. Dorina Hoyer. He mentions that his testosterone was extremely low at that time. - We discussed about possible causes for his low testosterone:  - Physiologic: later in the afternoon, after a meal   - when the testes do not secrete enough testosterone (he mentions that he was told that this was not the problem) - when the pituitary gland does not secrete enough LH - In his particular case, his MRI shows no pituitary tumor (I reviewed the images of his more recent MRI along with patient and his friend), however, I would like to check his pituitary hormones to clarify. Patient will return at 8 AM, fasting, for this. I will also check his testosterone level halfway between injections. I did explain that he has several  Other possible culprits:  1. Labs were drawn soon after diagnosis of RMSF, which, for him, was a fulminant, systemic, disease. Usually, the pituitary gland will shunt the production of testosterone or other hormones deemed "unnecessary" during acute illness. Testosterone levels could be quite low in this situation. 2. Labs were drawn also after a prednisone course, but I do not have a clear time sequence 3. He has a history of head trauma x2, which could have caused pituitary insufficiency 4. He has a history of alcohol use, however, he relates that he stopped in 2015 - We should start the investigation with checking all the pituitary hormones. After this, we can try to taper down his testosterone dose to see if his endogenous production increases. He is excited about this. - Return in about 6 months (around 07/21/2016).  Orders Placed This Encounter  Procedures  . T3, free  . T4, free  . Follicle stimulating hormone  . Cortisol  . ACTH  . Insulin-like growth factor  . Luteinizing hormone  .  Prolactin  . Estradiol  . Beta HCG, Quant  . Testosterone, Free, Total, SHBG  . TSH   Addendum: Received records from urology >> reviewed, and will scan them in Epic:  Patient's initial free testosterone level obtained by PCP was 0.4 pg/mL. At that time, he complained of low libido, testicular atrophy, difficulty obtaining and maintaining an erection.  A repeat testosterone level obtained by an integrative medicine provider in [place] (09/25/2015) showed again very low testosterone level: Total Testosterone <3 ng/dL, and the free testosterone of 0.2 ng/dL.  At that point, he was started on testosterone cypionate 8 IM 0.8 mL every other week. This has not improved his libido significantly, but it helped him obtaining morning erections.  A genital exam was performed on 11/09/2015 by his urologist: Atrophic left and right testes. No tenderness, swelling, masses, varicocele, or hydrocele. No abnormality of the penis. Circumcised status. Prostate: 2+ in size, 40 g. Normal consistency  Dr. Diona Fanti obtain the following labs - 11/09/2015 - collection time: 3:34 PM  Total testosterone 1125.7 (300-890) Free testosterone 175.3 (47-244) SHBG 66.3 (10-57)  FSH 0.1, LH 0.1 Prolactin 6.3 Of note, at the time of the above collection, patient was taking 0.8 mL testosterone every other week in hisdeltoid. The dose was changed at that time to 0.5 mL every week in his quadriceps  I am also in the process of reviewing records from PCP.   Component     Latest Ref Rng & Units 01/28/2016  Testosterone     264 - 916 ng/dL 1,103 (H)  Testosterone Free     7.2 - 24.0 pg/mL 15.1  Sex Horm Binding Glob, Serum     19.3 - 76.4 nmol/L 80.8 (H)  TSH     0.35 - 4.50 uIU/mL 1.90  Triiodothyronine,Free,Serum     2.3 - 4.2 pg/mL 3.9  T4,Free(Direct)     0.60 - 1.60 ng/dL 0.80  FSH     1.4 - 18.1 mIU/ML 0.1 (L)  Cortisol, Plasma     ug/dL 13.1  LH     1.50 - 9.30 mIU/mL 0.03 (L)  hCG Quant     0 - 3  mIU/mL <1  Total testosterone level is elevated, however free testosterone is normal. SHBG is high, which is desirable. Cortisol level is normal, no elevated beta hCG. Normal thyroid tests. Per our discussion, we will try to decrease the dose of testosterone to 0.3 ml every week. I plan to recheck his testosterone level  II months after the change.  Component     Latest Ref Rng & Units 01/28/2016  IGF-I, LC/MS     50 - 317 ng/mL 140  Z-Score (Male)     -2.0 - 2.0 SD 0.1  Estradiol, Free     ADULTS: < OR = 0.45 pg/mL 1.06 (H)  Estradiol     ADULTS: < OR = 29 pg/mL 53 (H)  Prolactin     2.0 - 18.0 ng/mL 8.1   IGF-I is normal. Estrogen level is high, which I suspect could be from the higher testosterone level. We will keep an eye on this to see if he decreases along with the dose of testosterone. Prolactin level is normal.  [name], MD PhD [facility]

## 2016-01-28 ENCOUNTER — Other Ambulatory Visit (INDEPENDENT_AMBULATORY_CARE_PROVIDER_SITE_OTHER): Payer: BLUE CROSS/BLUE SHIELD

## 2016-01-28 DIAGNOSIS — E291 Testicular hypofunction: Secondary | ICD-10-CM

## 2016-01-28 LAB — CORTISOL: CORTISOL PLASMA: 13.1 ug/dL

## 2016-01-28 LAB — T3, FREE: T3 FREE: 3.9 pg/mL (ref 2.3–4.2)

## 2016-01-28 LAB — T4, FREE: FREE T4: 0.8 ng/dL (ref 0.60–1.60)

## 2016-01-28 LAB — LUTEINIZING HORMONE: LH: 0.03 m[IU]/mL — AB (ref 1.50–9.30)

## 2016-01-28 LAB — TSH: TSH: 1.9 u[IU]/mL (ref 0.35–4.50)

## 2016-01-28 LAB — FOLLICLE STIMULATING HORMONE: FSH: 0.1 m[IU]/mL — ABNORMAL LOW (ref 1.4–18.1)

## 2016-01-29 LAB — PROLACTIN: PROLACTIN: 8.1 ng/mL (ref 2.0–18.0)

## 2016-01-29 LAB — TESTOSTERONE, FREE, TOTAL, SHBG
SEX HORMONE BINDING: 80.8 nmol/L — AB (ref 19.3–76.4)
Testosterone, Free: 15.1 pg/mL (ref 7.2–24.0)
Testosterone: 1103 ng/dL — ABNORMAL HIGH (ref 264–916)

## 2016-01-29 LAB — BETA HCG QUANT (REF LAB)

## 2016-01-30 DIAGNOSIS — E291 Testicular hypofunction: Secondary | ICD-10-CM | POA: Diagnosis not present

## 2016-01-30 LAB — ACTH: C206 ACTH: 43 pg/mL (ref 6–50)

## 2016-01-31 LAB — INSULIN-LIKE GROWTH FACTOR
IGF-I, LC/MS: 140 ng/mL (ref 50–317)
Z-Score (Male): 0.1 SD (ref ?–2.0)

## 2016-02-01 DIAGNOSIS — E230 Hypopituitarism: Secondary | ICD-10-CM | POA: Insufficient documentation

## 2016-02-04 LAB — ESTRADIOL, FREE
ESTRADIOL FREE: 1.06 pg/mL — AB (ref <=0.45)
Estradiol: 53 pg/mL — ABNORMAL HIGH (ref <=29)

## 2016-02-04 NOTE — Progress Notes (Signed)
I agree with the assessment and plan as directed by Dr. Lucie Leather, MD

## 2016-02-07 ENCOUNTER — Telehealth: Payer: Self-pay

## 2016-02-07 NOTE — Telephone Encounter (Signed)
Pt brought a letter to our office today.  "Please see attached lab results taken on 01/28/16 by Dr. Renne Crigler. 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 Since the MRI does not show damage to these areas but the labs are reflecting otherwise, I am more than concerned what is happening neurologically and need to know our next course of action."  Dr. [name] asked me to ask Dr. Cruzita Lederer if she believes pt's testosterone injections are causing lab abnormalities (LH is low, FHS is low, estradiol high).

## 2016-02-07 NOTE — Telephone Encounter (Signed)
Yes, his LH and FSH are likely low because of the testosterone injections and his estrogen is slightly high for the same reason.  Per my review of his records, we do not have [place] levels from before he started testosterone injections, unfortunately. Therefore, at this point, I do not have evidence of a pituitary dysfunction. We are working on decreasing his dose of testosterone to see if he can come off the medication. We may discover a selective LH and FSH pituitary deficiency in the future, but as of now, we do not have enough information about this.  The rest of his pituitary hormones are reassuringly normal.  Patient has an appointment coming up in several days and I will discuss with him about this. I already sent him a message in my chart about the labs.

## 2016-02-07 NOTE — Telephone Encounter (Signed)
Based on Dr. Arman Filter recommendations, how do you want to proceed? Should I offer pt a sooner appt?

## 2016-02-08 NOTE — Telephone Encounter (Signed)
He doesn't need an earlier appointment.  This is not a proven pituitary dysfunction, likely a side effect of testosterone. He doesn't have to do anything, just slowly wean off testosterone for further work up. I spoke to his sister today, as he was out .  [phone] [phone] She understood the mechanism of hormone suppression and will relate the message to him.  CD

## 2016-02-18 ENCOUNTER — Other Ambulatory Visit: Payer: Self-pay | Admitting: Neurology

## 2016-02-18 DIAGNOSIS — R5383 Other fatigue: Secondary | ICD-10-CM

## 2016-02-18 DIAGNOSIS — S0990XS Unspecified injury of head, sequela: Secondary | ICD-10-CM

## 2016-02-18 MED ORDER — ADDERALL 30 MG PO TABS: 30.0000 mg | ORAL_TABLET | Freq: Two times a day (BID) | ORAL | 0 refills | Status: DC

## 2016-02-18 NOTE — Telephone Encounter (Signed)
I spoke to [name] (per [facility]) and advised her that pt's RX for adderall is ready for pick up at the front desk. Pt's friend verbalized understanding.

## 2016-02-18 NOTE — Telephone Encounter (Signed)
Phillip [name] called to request refill of ADDERALL 30 MG tablet

## 2016-02-20 ENCOUNTER — Ambulatory Visit (INDEPENDENT_AMBULATORY_CARE_PROVIDER_SITE_OTHER): Payer: BLUE CROSS/BLUE SHIELD | Admitting: Internal Medicine

## 2016-02-20 ENCOUNTER — Encounter: Payer: Self-pay | Admitting: Internal Medicine

## 2016-02-20 VITALS — BP 130/84 | HR 81 | Wt 184.0 lb

## 2016-02-20 DIAGNOSIS — E230 Hypopituitarism: Secondary | ICD-10-CM

## 2016-02-20 NOTE — Progress Notes (Signed)
Patient ID: Phillip [name], male   DOB: [birth year], 60 y.o.   MRN: [id number]   HPI: Phillip [name] is a 60 y.o.-year-old man, initially referred by his neurologist, Dr. [name], now returning for follow-up for hypogonadism. Last visit a month ago.  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 He had a subsequent visit with his PCP in 09/2015 >> a testosterone level was found to be extremely low.   Received records from urology >> reviewed: Patient's initial free testosterone level obtained by PCP was 0.4 pg/mL (09/17/2015). At that time, he complained of low libido, testicular atrophy, difficulty obtaining and maintaining an erection. A repeat testosterone level obtained by an integrative medicine provider in [place] (09/25/2015) showed again very low testosterone level: Total Testosterone <3 ng/dL, and the free testosterone of 0.2 ng/dL. DHEAS was 122.4 (48.9-344.2). Of note, no LH and FSH levels were drawn at that time. At that point, he was started on testosterone cypionate 8 IM 0.8 mL every other week. This has not improved his libido significantly, but it helped him obtaining morning erections.  A genital exam was performed on 11/09/2015 by his urologist: Atrophic left and right testes. No tenderness, swelling, masses, varicocele, or hydrocele. No abnormality of the penis. Circumcised status. Prostate: 2+ in size, 40 g. Normal consistency  Dr. Diona Fanti obtain the following labs - 11/09/2015 - collection time: 3:34 PM  Total testosterone 1125.7 (300-890) Free testosterone 175.3 (47-244) SHBG 66.3 (10-57)  FSH 0.1, LH 0.1 Prolactin 6.3 Of note, at the time of the above collection, patient was taking 0.8 mL testosterone every other week in his deltoid. The dose was changed at that time to 0.5 mL every week in his quadriceps, after which his fatigue improved a little, and he had more  energy.   He is followed by urology >>  F/u testosterone was normal. He is, however, interested in coming off testosterone, if possible.  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 He saw Dr. [name], who obtained a  brain  + pituitary MRI (10/24/2015). 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 There was no abnormality in the pituitary gland.  At last visit, he mentioned: He admits for previous decreased libido >> normal now Had difficulty obtaining or maintaining an erection >> not anymore No trauma to testes, testicular irradiation or surgery No h/o of mumps orchitis/h/o autoimmune ds. No h/o cryptorchidism He grew and went through puberty like his peers + shrinking of testes. No very small testes (<5 ml) No incomplete/delayed sexual development     No breast discomfort/gynecomastia    No loss of body hair (axillary/pubic)/decreased need for shaving No height loss No abnormal sense of smell  + hot flushes, resolved No vision problems, other than blurry vision in R eye, also photopsia - started 11/2015 No worst HA of his life He does have a history of head trauma in 1997, when a tree fell on top of his head and he lost consciousness for more than 30 minutes; in 2009 he fell off ladder and landed on back of his head No FH of hypogonadism/infertility No personal h/o infertility - has 2 children: 58 and 48 years old  No FH of hemochromatosis or pituitary tumors No excessive weight gain or loss.  No  chronic diseases, but recently diagnosed with RMSF Spring 2017 No chronic pain. Not on opiates, does not take steroids, had a course this spring - ~1 week He was drinking Alcohol: ~2 drinks a day up to 2015 >>  stopped completely since then No anabolic steroids use No herbal medicines. Not on  antidepressants  No AI ds in his family, no FH of MS. He does not have family history of early cardiac disease.  At last visit, we check the following labs - all were reviewed today with the patient and his wife:  Component     Latest Ref Rng & Units 01/28/2016  Testosterone     264 - 916 ng/dL 1,103 (H)  Testosterone Free     7.2 - 24.0 pg/mL 15.1  Sex Horm Binding Glob, Serum     19.3 - 76.4 nmol/L 80.8 (H)  TSH     0.35 - 4.50 uIU/mL 1.90  Triiodothyronine,Free,Serum     2.3 - 4.2 pg/mL 3.9  T4,Free(Direct)     0.60 - 1.60 ng/dL 0.80  FSH     1.4 - 18.1 mIU/ML 0.1 (L)  Cortisol, Plasma     ug/dL 13.1  LH     1.50 - 9.30 mIU/mL 0.03 (L)  hCG Quant     0 - 3 mIU/mL <1  Total testosterone level is elevated, however free testosterone is normal. SHBG is high, which is desirable. Cortisol level is normal, no elevated beta hCG. Normal thyroid tests.  Component     Latest Ref Rng & Units 01/28/2016  IGF-I, LC/MS     50 - 317 ng/mL 140  Z-Score (Male)     -2.0 - 2.0 SD 0.1  Estradiol, Free     ADULTS: < OR = 0.45 pg/mL 1.06 (H)  Estradiol     ADULTS: < OR = 29 pg/mL 53 (H)  Prolactin     2.0 - 18.0 ng/mL 8.1   IGF-I is normal. Estrogen level is high, Likely from the higher testosterone level. Prolactin level is normal.  Based on the above labs, I advised him to try to decrease the testosterone to 0.3 ml every week and return for a new level in 2 months after the change in dose.  He is feeling well on this dose, with no change in erections and libido.  ROS: Constitutional: no weight gain/loss,  no fatigue,  no subjective hyperthermia Eyes: no blurry vision, no xerophthalmia ENT: no sore throat, no nodules palpated in throat, no dysphagia/odynophagia, no hoarseness Cardiovascular: no CP/SOB/palpitations/leg swelling Respiratory: no cough/SOB Gastrointestinal: no N/V/D/C Musculoskeletal:  no muscle/joint aches Skin: no rashes Neurological: no  tremors/numbness/tingling/dizziness  I reviewed pt's medications, allergies, PMH, social hx, family hx, and changes were documented in the history of present illness. Otherwise, unchanged from my initial visit note.  Past Medical History:  Diagnosis Date  . ADD (attention deficit disorder)   . Anxiety   . Cellulitis   . Depression   . Encephalitis   . Pneumonia   . [facility] spotted fever   . TBI (traumatic brain injury) [facility])    Past Surgical History:  Procedure Laterality Date  . TEE WITHOUT CARDIOVERSION N/A 09/06/2015   Procedure: TRANSESOPHAGEAL ECHOCARDIOGRAM (TEE);  Surgeon: Jerline Pain, MD;  Location: [facility] ENDOSCOPY;  Service: Cardiovascular;  Laterality: N/A;  . TONSILLECTOMY     Social History   Social History  . Marital status: Single    Spouse name: N/A  . Number of children: 2   Occupational  History  . n/a   Social History Main Topics  . Smoking status: Former Smoker    Packs/day: 1.50    Years: 25.00    Types: Cigarettes    Quit date: 2002  . Smokeless tobacco: Never Used     Comment: Encouraged to remain smoke free  . Alcohol use No     Comment: 8 drinks  . Drug use: No   Social History Narrative   Divorced. Education: [employer].   Current Outpatient Prescriptions on File Prior to Visit  Medication Sig Dispense Refill  . ADDERALL 30 MG tablet Take 1 tablet by mouth 2 (two) times daily. 60 tablet 0  . amLODipine (NORVASC) 2.5 MG tablet   2  . LORazepam (ATIVAN) 1 MG tablet Take 2 tablets (2 mg total) by mouth at bedtime. 60 tablet 5  . TESTOSTERONE IM Inject 0.3 mg into the muscle.     No current facility-administered medications on file prior to visit.    Allergies  Allergen Reactions  . Amphetamines     Intolerant to specific formulations: [employer] amphetamine TEVA dextroamp amphetamine  . Lorazepam     Mylan lorazepam  . Methylprednisolone   . Penicillins Hives  . Percocet [Oxycodone-Acetaminophen]     Pt says he is not allergic to  this.  . Propofol     Cognitive delay and emotional   Family History  Problem Relation Age of Onset  . Hyperlipidemia Father   . Heart disease Maternal Grandfather   . Cancer Paternal Grandfather   . Mental retardation Mother    PE: BP 130/84 (BP Location: Left Arm, Patient Position: Sitting)   Pulse 81   Wt 184 lb (83.5 kg)   SpO2 98%   BMI 24.61 kg/m  Wt Readings from Last 3 Encounters:  02/20/16 184 lb (83.5 kg)  01/22/16 181 lb (82.1 kg)  11/26/15 185 lb 8 oz (84.1 kg)   Constitutional: Normal weight , in NAD Eyes: PERRLA, EOMI, no exophthalmos ENT: moist mucous membranes, no thyromegaly, no cervical lymphadenopathy Cardiovascular: RRR, No MRG Respiratory: CTA B Gastrointestinal: abdomen soft, NT, ND, BS+ Musculoskeletal: no deformities, strength intact in all 4 Skin: moist, warm, no rashes Neurological: no tremor with outstretched hands, DTR normal in all 4  ASSESSMENT: 1. 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 Hypogonadism I reviewed pt's previous imaging report and labs with the pt and his friend: his MRI shows no pituitary tumor and his pituitary hormones were normal (except LH and FSH which were low, as expected on Testosterone tx). His total testosterone obtained last month was also elevated, correlating with the testosterone level obtained in the urology office, and the free  testosterone was normal. I explained the difference b/w free and total testosterone and the SHBG value.  - We will continue with the plan to try to decrease his testosterone supplementation to allow his pituitary to recover. Patient will return at 8 AM, fasting, for a repeat of testosterone level on the new dose. We will check his testosterone level halfway between injections,  - we again discussed possible reasons for his very low initial testosterone: 1. Labs were drawn soon after diagnosis of RMSF, which, for him, was a fulminant, systemic, disease. Usually, the pituitary gland  will shunt the production of testosterone or other hormones deemed "unnecessary" during acute illness. Testosterone levels could be quite low in this situation. 2. Labs were drawn also after a prednisone course, but I do not have a clear time sequence 3. He  has a history of head trauma x2, which could have caused pituitary insufficiency 4. He has a history of alcohol use, however, he relates that he stopped in 2015 - will see him back in 6 mo  Orders Placed This Encounter  Procedures  . Testosterone, Free, Total, SHBG   [name], MD PhD [facility]

## 2016-02-20 NOTE — Patient Instructions (Addendum)
Please continue the Testosterone 0.3 ml every week.  Please come back for a testosterone level half-way between injections, at 8 am, fasting - in 6 weeks.  Please come back for a follow-up appointment in 6 months.

## 2016-02-21 NOTE — Progress Notes (Signed)
I agree with the assessment and plan   Geremy Rister, MD

## 2016-03-12 ENCOUNTER — Encounter: Payer: Self-pay | Admitting: Neurology

## 2016-03-12 ENCOUNTER — Ambulatory Visit (INDEPENDENT_AMBULATORY_CARE_PROVIDER_SITE_OTHER): Payer: BLUE CROSS/BLUE SHIELD | Admitting: Neurology

## 2016-03-12 VITALS — BP 122/72 | HR 76 | Resp 20 | Ht 73.0 in | Wt 182.0 lb

## 2016-03-12 DIAGNOSIS — R5383 Other fatigue: Secondary | ICD-10-CM | POA: Diagnosis not present

## 2016-03-12 DIAGNOSIS — S0990XS Unspecified injury of head, sequela: Secondary | ICD-10-CM | POA: Diagnosis not present

## 2016-03-12 DIAGNOSIS — Z8619 Personal history of other infectious and parasitic diseases: Secondary | ICD-10-CM

## 2016-03-12 MED ORDER — ADDERALL 30 MG PO TABS: 30.0000 mg | ORAL_TABLET | Freq: Two times a day (BID) | ORAL | 0 refills | Status: DC

## 2016-03-12 MED ORDER — LORAZEPAM 1 MG PO TABS: 2.0000 mg | ORAL_TABLET | Freq: Every day | ORAL | 5 refills | Status: DC

## 2016-03-12 NOTE — Patient Instructions (Signed)
Fatigue  Fatigue is feeling tired all of the time, a lack of energy, or a lack of motivation. Occasional or mild fatigue is often a normal response to activity or life in general. However, long-lasting (chronic) or extreme fatigue may indicate an underlying medical condition.  HOME CARE INSTRUCTIONS   Watch your fatigue for any changes. The following actions may help to lessen any discomfort you are feeling:  · Talk to your health care provider about how much sleep you need each night. Try to get the required amount every night.  · Take medicines only as directed by your health care provider.  · Eat a healthy and nutritious diet. Ask your health care provider if you need help changing your diet.  · Drink enough fluid to keep your urine clear or pale yellow.  · Practice ways of relaxing, such as yoga, meditation, massage therapy, or acupuncture.  · Exercise regularly.    · Change situations that cause you stress. Try to keep your work and personal routine reasonable.  · Do not abuse illegal drugs.  · Limit alcohol intake to no more than 1 drink per day for nonpregnant women and 2 drinks per day for men. One drink equals 12 ounces of beer, 5 ounces of wine, or 1½ ounces of hard liquor.  · Take a multivitamin, if directed by your health care provider.  SEEK MEDICAL CARE IF:   · Your fatigue does not get better.  · You have a fever.    · You have unintentional weight loss or gain.  · You have headaches.    · You have difficulty:      Falling asleep.    Sleeping throughout the night.  · You feel angry, guilty, anxious, or sad.     · You are unable to have a bowel movement (constipation).    · You skin is dry.     · Your legs or another part of your body is swollen.    SEEK IMMEDIATE MEDICAL CARE IF:   · You feel confused.    · Your vision is blurry.  · You feel faint or pass out.    · You have a severe headache.    · You have severe abdominal, pelvic, or back pain.    · You have chest pain, shortness of breath, or an  irregular or fast heartbeat.    · You are unable to urinate or you urinate less than normal.    · You develop abnormal bleeding, such as bleeding from the rectum, vagina, nose, lungs, or nipples.  · You vomit blood.     · You have thoughts about harming yourself or committing suicide.    · You are worried that you might harm someone else.       This information is not intended to replace advice given to you by your health care provider. Make sure you discuss any questions you have with your health care provider.     Document Released: 02/16/2007 Document Revised: 05/12/2014 Document Reviewed: 08/23/2013  Elsevier Interactive Patient Education ©2016 Elsevier Inc.
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
## 2016-03-25 DIAGNOSIS — R4189 Other symptoms and signs involving cognitive functions and awareness: Secondary | ICD-10-CM | POA: Diagnosis not present

## 2016-03-25 DIAGNOSIS — E291 Testicular hypofunction: Secondary | ICD-10-CM | POA: Diagnosis not present

## 2016-03-25 DIAGNOSIS — A449 Bartonellosis, unspecified: Secondary | ICD-10-CM | POA: Diagnosis not present

## 2016-03-31 ENCOUNTER — Other Ambulatory Visit: Payer: BLUE CROSS/BLUE SHIELD

## 2016-03-31 DIAGNOSIS — E230 Hypopituitarism: Secondary | ICD-10-CM

## 2016-04-01 LAB — TESTOSTERONE, FREE, TOTAL, SHBG
Sex Hormone Binding: 85.3 nmol/L — ABNORMAL HIGH (ref 19.3–76.4)
Testosterone, Free: 8.5 pg/mL (ref 7.2–24.0)
Testosterone: 762 ng/dL (ref 264–916)

## 2016-04-17 ENCOUNTER — Telehealth: Payer: Self-pay | Admitting: Neurology

## 2016-04-17 DIAGNOSIS — R5383 Other fatigue: Secondary | ICD-10-CM

## 2016-04-17 DIAGNOSIS — S0990XS Unspecified injury of head, sequela: Secondary | ICD-10-CM

## 2016-04-17 MED ORDER — ADDERALL 30 MG PO TABS: 30.0000 mg | ORAL_TABLET | Freq: Two times a day (BID) | ORAL | 0 refills | Status: DC

## 2016-04-17 NOTE — Telephone Encounter (Signed)
Pt friend [name] request refill for ADDERALL 30 MG tablet. She is aware clinic closes at noon tomorrow

## 2016-04-17 NOTE — Telephone Encounter (Signed)
That's fine thanks please print and bring it to me to sign

## 2016-04-17 NOTE — Telephone Encounter (Signed)
Dr. [name] patient, Rx is due, patient is up to date on appts. Ok to print?

## 2016-04-17 NOTE — Telephone Encounter (Signed)
Rx printed, waiting for signature

## 2016-04-17 NOTE — Telephone Encounter (Signed)
Rx placed at front desk. I did not call to advise. Last DPR does not mention her name, I cannot tell if it is to be added to previous DPR?

## 2016-05-06 ENCOUNTER — Other Ambulatory Visit: Payer: Self-pay | Admitting: Acute Care

## 2016-05-06 DIAGNOSIS — Z87891 Personal history of nicotine dependence: Secondary | ICD-10-CM

## 2016-05-20 ENCOUNTER — Other Ambulatory Visit: Payer: Self-pay | Admitting: Neurology

## 2016-05-20 DIAGNOSIS — S0990XS Unspecified injury of head, sequela: Secondary | ICD-10-CM

## 2016-05-20 DIAGNOSIS — R5383 Other fatigue: Secondary | ICD-10-CM

## 2016-05-20 MED ORDER — ADDERALL 30 MG PO TABS: 30.0000 mg | ORAL_TABLET | Freq: Two times a day (BID) | ORAL | 0 refills | Status: DC

## 2016-05-20 NOTE — Telephone Encounter (Signed)
Pt's RX for adderall is ready for pick up at the front desk. Pt reported that he will call the office before he picks up his RX. Please advise him of current office hours if pt calls.

## 2016-05-20 NOTE — Telephone Encounter (Signed)
Pt request refill for ADDERALL 30 MG tablet. Pt was advised the office will open at noon tomorrow tentivley. He is to call before coming to pick up RX

## 2016-05-28 ENCOUNTER — Ambulatory Visit: Payer: BLUE CROSS/BLUE SHIELD | Admitting: Adult Health

## 2016-06-06 ENCOUNTER — Other Ambulatory Visit: Payer: Self-pay | Admitting: Acute Care

## 2016-06-06 DIAGNOSIS — Z87891 Personal history of nicotine dependence: Secondary | ICD-10-CM

## 2016-06-09 ENCOUNTER — Other Ambulatory Visit: Payer: Self-pay | Admitting: Neurology

## 2016-06-11 ENCOUNTER — Other Ambulatory Visit: Payer: Self-pay | Admitting: Neurology

## 2016-06-11 ENCOUNTER — Telehealth: Payer: Self-pay | Admitting: Neurology

## 2016-06-11 MED ORDER — LORAZEPAM 1 MG PO TABS: 2.0000 mg | ORAL_TABLET | Freq: Every day | ORAL | 2 refills | Status: DC

## 2016-06-11 NOTE — Telephone Encounter (Signed)
Rx faxed in now

## 2016-06-11 NOTE — Telephone Encounter (Signed)
I spoke to pharmacy, they confirmed that they did not have any refills on file. No Rx on file. Last filled 05/14/15.

## 2016-06-11 NOTE — Telephone Encounter (Signed)
Pt's friend [name] request refill for LORazepam (ATIVAN) 1 MG tablet sent [facility]. She was advised by pharmacy there are no refills. She advised he will be out tomorrow night. She said his insurance changed 05/05/16: BC/[place] [id number] GRP# [id number] RXBIN# [id number]

## 2016-06-11 NOTE — Telephone Encounter (Signed)
Printed and waiting for signature, I will fax in once signed.

## 2016-06-17 ENCOUNTER — Telehealth: Payer: Self-pay | Admitting: Neurology

## 2016-06-17 DIAGNOSIS — S0990XS Unspecified injury of head, sequela: Secondary | ICD-10-CM

## 2016-06-17 DIAGNOSIS — R5383 Other fatigue: Secondary | ICD-10-CM

## 2016-06-17 MED ORDER — ADDERALL 30 MG PO TABS: 30.0000 mg | ORAL_TABLET | Freq: Two times a day (BID) | ORAL | 0 refills | Status: DC

## 2016-06-17 NOTE — Addendum Note (Signed)
Addended by: Laurence Spates on: 06/17/2016 01:33 PM   Modules accepted: Orders

## 2016-06-17 NOTE — Telephone Encounter (Signed)
Pt's friend [name] request refill for ADDERALL 30 MG tablet. She is requesting to pick up RX this week. Michela Pitcher he will be out on Monday

## 2016-06-17 NOTE — Telephone Encounter (Signed)
Printed and waiting for signature. Once signed I will take to the front desk

## 2016-06-18 DIAGNOSIS — R413 Other amnesia: Secondary | ICD-10-CM | POA: Diagnosis not present

## 2016-06-18 NOTE — Telephone Encounter (Signed)
LM for pt advising that Rx is at the front desk ready for p/u

## 2016-07-16 ENCOUNTER — Telehealth: Payer: Self-pay | Admitting: Neurology

## 2016-07-16 DIAGNOSIS — S0990XS Unspecified injury of head, sequela: Secondary | ICD-10-CM

## 2016-07-16 DIAGNOSIS — R5383 Other fatigue: Secondary | ICD-10-CM

## 2016-07-16 MED ORDER — ADDERALL 30 MG PO TABS: 30.0000 mg | ORAL_TABLET | Freq: Two times a day (BID) | ORAL | 0 refills | Status: DC

## 2016-07-16 NOTE — Addendum Note (Signed)
Addended by: Laurence Spates on: 07/16/2016 04:37 PM   Modules accepted: Orders

## 2016-07-16 NOTE — Telephone Encounter (Signed)
Reprinted, 1st Rx did not print on Rx paper.

## 2016-07-16 NOTE — Telephone Encounter (Signed)
Phillip [name] called request refill for ADDERALL 30 MG tablet and would like to pick it up tomorrow

## 2016-07-16 NOTE — Telephone Encounter (Signed)
Printed, waiting for signature, once signed we will take to the front desk

## 2016-07-17 NOTE — Telephone Encounter (Signed)
Adderall Rx at front desk for pick up.

## 2016-07-21 ENCOUNTER — Ambulatory Visit: Payer: BLUE CROSS/BLUE SHIELD | Admitting: Internal Medicine

## 2016-08-13 ENCOUNTER — Telehealth: Payer: Self-pay | Admitting: Neurology

## 2016-08-13 DIAGNOSIS — R5383 Other fatigue: Secondary | ICD-10-CM

## 2016-08-13 DIAGNOSIS — S0990XS Unspecified injury of head, sequela: Secondary | ICD-10-CM

## 2016-08-13 MED ORDER — ADDERALL 30 MG PO TABS: 30.0000 mg | ORAL_TABLET | Freq: Two times a day (BID) | ORAL | 0 refills | Status: DC

## 2016-08-13 NOTE — Addendum Note (Signed)
Addended by: Laurence Spates on: 08/13/2016 01:48 PM   Modules accepted: Orders

## 2016-08-13 NOTE — Telephone Encounter (Signed)
Pt up to date on appts and Rx due tomorrow. I will print. Once printed I will put up at front desk for pick up

## 2016-08-13 NOTE — Telephone Encounter (Signed)
Phillip [name] (listed on DPR) called requesting refill for ADDERALL 30 MG tablet

## 2016-08-14 ENCOUNTER — Encounter: Payer: Self-pay | Admitting: Acute Care

## 2016-08-14 NOTE — Telephone Encounter (Signed)
Rx placed at the front desk.

## 2016-08-20 ENCOUNTER — Ambulatory Visit: Payer: BLUE CROSS/BLUE SHIELD | Admitting: Internal Medicine

## 2016-08-26 ENCOUNTER — Telehealth: Payer: Self-pay | Admitting: Internal Medicine

## 2016-08-26 NOTE — Telephone Encounter (Signed)
Phillip [name] to [facility] [phone].PWR

## 2016-08-27 ENCOUNTER — Telehealth: Payer: Self-pay | Admitting: Internal Medicine

## 2016-08-27 NOTE — Telephone Encounter (Signed)
08/27/16 Faxed [facility] to [facility] [phone]. PWR

## 2016-09-09 ENCOUNTER — Ambulatory Visit (INDEPENDENT_AMBULATORY_CARE_PROVIDER_SITE_OTHER): Payer: BLUE CROSS/BLUE SHIELD | Admitting: Adult Health

## 2016-09-09 ENCOUNTER — Encounter: Payer: Self-pay | Admitting: Adult Health

## 2016-09-09 VITALS — BP 119/74 | HR 75 | Ht 73.0 in | Wt 180.2 lb

## 2016-09-09 DIAGNOSIS — F988 Other specified behavioral and emotional disorders with onset usually occurring in childhood and adolescence: Secondary | ICD-10-CM | POA: Diagnosis not present

## 2016-09-09 DIAGNOSIS — S0990XS Unspecified injury of head, sequela: Secondary | ICD-10-CM | POA: Diagnosis not present

## 2016-09-09 DIAGNOSIS — R5383 Other fatigue: Secondary | ICD-10-CM | POA: Diagnosis not present

## 2016-09-09 MED ORDER — ADDERALL 30 MG PO TABS: 30.0000 mg | ORAL_TABLET | Freq: Two times a day (BID) | ORAL | 0 refills | Status: DC

## 2016-09-09 MED ORDER — LORAZEPAM 1 MG PO TABS: 2.0000 mg | ORAL_TABLET | Freq: Every day | ORAL | 5 refills | Status: DC

## 2016-09-09 NOTE — Patient Instructions (Signed)
Continue Adderall Can try to wean off Ativan: Decrease to 1.5 tablets for 2 weeks, then decrease to 1 tablet for 2 weeks, then 0.5 tablet thereafter If your symptoms worsen or you develop new symptoms please let us know.
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
## 2016-09-09 NOTE — Progress Notes (Signed)
I agree with the assessment and plan as directed by NP .The patient is known to me .   Gelsey Amyx, MD  

## 2016-09-26 ENCOUNTER — Other Ambulatory Visit: Payer: BLUE CROSS/BLUE SHIELD

## 2016-09-26 DIAGNOSIS — E230 Hypopituitarism: Secondary | ICD-10-CM

## 2016-09-28 LAB — TESTOSTERONE, FREE, TOTAL, SHBG
Sex Hormone Binding: 102.4 nmol/L — ABNORMAL HIGH (ref 19.3–76.4)
Testosterone, Free: 2.8 pg/mL — ABNORMAL LOW (ref 7.2–24.0)
Testosterone: 243 ng/dL — ABNORMAL LOW (ref 264–916)

## 2016-09-30 ENCOUNTER — Ambulatory Visit: Payer: BLUE CROSS/BLUE SHIELD | Admitting: Internal Medicine

## 2016-10-13 ENCOUNTER — Other Ambulatory Visit: Payer: Self-pay | Admitting: Adult Health

## 2016-10-13 DIAGNOSIS — S0990XS Unspecified injury of head, sequela: Secondary | ICD-10-CM

## 2016-10-13 DIAGNOSIS — R5383 Other fatigue: Secondary | ICD-10-CM

## 2016-10-13 MED ORDER — ADDERALL 30 MG PO TABS: 30.0000 mg | ORAL_TABLET | Freq: Two times a day (BID) | ORAL | 0 refills | Status: DC

## 2016-10-13 NOTE — Addendum Note (Signed)
Addended by: Lester [place] A on: 10/13/2016 05:01 PM   Modules accepted: Orders

## 2016-10-13 NOTE — Telephone Encounter (Signed)
Adderall was last filled on 09/09/2016 for a 30 day supply. Pt is due for a refill. Will send to Dr. Jaynee Eagles, [facility], for review and approval in Dr. Edwena Felty absence.

## 2016-10-13 NOTE — Telephone Encounter (Signed)
Pt's friend called said the adderall has come up short this month. He will have a 1/2 dose on tomorrow. She is asking for refill. He can be reached at [phone]. She said it should be filled on Thursday. He takes 2/day.

## 2016-10-14 NOTE — Telephone Encounter (Signed)
I called Sherrilee Gilles, pt's friend, per DPR. I advised her that pt's RX for adderall is ready for pick up at the front desk. [name] tells me that pt is almost out of adderall and is "short." I explain again that our records indicated the last time Dr. [name] wrote for pt's adderall was on 09/09/2016 and therefore, pt should be due for another refill, which may explain why he is almost out of adderall tablets. [name] seems confused and keeps telling me that pt is out of adderall. I advised her that if the pt brought his adderall RX from 09/09/2016 to his pharmacy within about 72 hours of the RX being written, it would make sense that he is almost out of adderall. We only give a 30 day supply. I advised her that it would be best to come pick up this RX for adderall and take it to the pharmacy and try to get it filled today. If pt delayed in getting the script from 09/09/2016 filled, he may not be able to fill this script, because it may be too soon, and the 30 day supply might not be up yet. In this case, she would need to call us back and inform us of this, because the pt may be taking the adderall incorrectly. Pt's friend, [name], said she would come by and pick the RX for adderall up, and I gave her the clinic hours.

## 2016-11-02 ENCOUNTER — Encounter (HOSPITAL_COMMUNITY): Payer: Self-pay | Admitting: *Deleted

## 2016-11-02 ENCOUNTER — Ambulatory Visit (HOSPITAL_COMMUNITY)
Admission: EM | Admit: 2016-11-02 | Discharge: 2016-11-02 | Disposition: A | Discharge status: Home / Self Care | Payer: BLUE CROSS/BLUE SHIELD | Attending: Internal Medicine | Admitting: Internal Medicine

## 2016-11-02 DIAGNOSIS — N529 Male erectile dysfunction, unspecified: Secondary | ICD-10-CM

## 2016-11-02 DIAGNOSIS — R21 Rash and other nonspecific skin eruption: Secondary | ICD-10-CM | POA: Diagnosis not present

## 2016-11-02 HISTORY — DX: Male erectile dysfunction, unspecified: N52.9

## 2016-11-02 MED ORDER — DOXYCYCLINE HYCLATE 100 MG PO CAPS: 100.0000 mg | ORAL_CAPSULE | Freq: Two times a day (BID) | ORAL | 0 refills | Status: DC

## 2016-11-02 NOTE — ED Provider Notes (Signed)
CSN: [id number]     Arrival date & time 11/02/16  1857 History   None    Chief Complaint  Patient presents with  . Bleeding/Bruising   (Consider location/radiation/quality/duration/timing/severity/associated sxs/prior Treatment) The history is provided by the patient.  Rash  Location:  Shoulder/arm Shoulder/arm rash location:  L forearm Quality: bruising, itchiness and redness   Quality: not painful and not swelling   Severity:  Moderate Onset quality:  Gradual Duration:  3 days Timing:  Constant Progression:  Spreading Chronicity:  New Context: not animal contact, not exposure to similar rash, not hot tub use, not insect bite/sting, not medications and not plant contact   Relieved by:  Nothing Worsened by:  Nothing Ineffective treatments:  None tried Associated symptoms: no abdominal pain, no fatigue, no fever, no induration, no nausea, no sore throat, no URI and not vomiting     Past Medical History:  Diagnosis Date  . ADD (attention deficit disorder)   . Anxiety   . Cellulitis   . Depression   . Encephalitis   . Meningitis   . Pneumonia   . [facility] spotted fever   . TBI (traumatic brain injury) [facility])    Past Surgical History:  Procedure Laterality Date  . TEE WITHOUT CARDIOVERSION N/A 09/06/2015   Procedure: TRANSESOPHAGEAL ECHOCARDIOGRAM (TEE);  Surgeon: Jerline Pain, MD;  Location: [facility] ENDOSCOPY;  Service: Cardiovascular;  Laterality: N/A;  . TONSILLECTOMY     Family History  Problem Relation Age of Onset  . Hyperlipidemia Father   . Heart disease Maternal Grandfather   . Cancer Paternal Grandfather   . Mental retardation Mother    Social History  Substance Use Topics  . Smoking status: Former Smoker    Packs/day: 1.50    Years: 25.00    Types: Cigarettes    Quit date: 11/22/2014  . Smokeless tobacco: Never Used     Comment: Encouraged to remain smoke free  . Alcohol use No     Comment: 8 drinks    Review of Systems  Constitutional: Negative  for fatigue and fever.  HENT: Negative for congestion, sinus pain, sinus pressure and sore throat.   Respiratory: Negative.   Cardiovascular: Negative.   Gastrointestinal: Negative for abdominal pain, nausea and vomiting.  Musculoskeletal: Negative.   Skin: Positive for rash.  Neurological: Negative.     Allergies  Amphetamines; Lorazepam; Methylprednisolone; Penicillins; Percocet [oxycodone-acetaminophen]; and Propofol  Home Medications   Prior to Admission medications   Medication Sig Start Date End Date Taking? Authorizing Provider  ADDERALL 30 MG tablet Take 1 tablet by mouth 2 (two) times daily. 10/13/16  Yes Melvenia Beam, MD  LORazepam (ATIVAN) 1 MG tablet Take 2 tablets (2 mg total) by mouth at bedtime. 09/09/16  Yes [name], NP  TESTOSTERONE IM Inject 0.3 mg into the muscle.   Yes [provider]  amLODipine (NORVASC) 2.5 MG tablet  01/14/16   [provider]  doxycycline (VIBRAMYCIN) 100 MG capsule Take 1 capsule (100 mg total) by mouth 2 (two) times daily. 11/02/16   Barnet Glasgow, NP   Meds Ordered and Administered this Visit  Medications - No data to display  BP (!) 149/94   Pulse 63   Temp 97.8 F (36.6 C) (Oral)   Resp 16   SpO2 99%  No data found.   Physical Exam  Constitutional: He is oriented to person, place, and time. He appears well-developed and well-nourished. No distress.  HENT:  Head: Normocephalic and atraumatic.  Right Ear: External ear normal.  Left Ear: External ear normal.  Eyes: Conjunctivae are normal.  Neck: Normal range of motion.  Cardiovascular: Normal rate and regular rhythm.   Pulmonary/Chest: Effort normal and breath sounds normal.  Neurological: He is alert and oriented to person, place, and time.  Skin: Skin is warm and dry. Capillary refill takes less than 2 seconds. Rash noted. He is not diaphoretic.  Psychiatric: He has a normal mood and affect. His behavior is normal.  Nursing note and vitals  reviewed.   Urgent Care Course     Procedures (including critical care time)  Labs Review Labs Reviewed - No data to display  Imaging Review No results found.    MDM   1. Rash and nonspecific skin eruption     Outline of the rash marked with a skin pen, patient given a paper prescription for doxycycline, advised to hold not take unless rash begins to spread, develops fever, redness, swelling, or other symptoms. Follow-up with dermatology if symptoms persist    Barnet Glasgow, NP 11/02/16 2125

## 2016-11-02 NOTE — ED Triage Notes (Addendum)
Reports area of ecchymosis to left wrist/forearm area x 2 days without known injury.  Unsure if bite centrally located.  With hx of RMSF and encephalitis, family wanted pt to get evaluated.  Denies any pain.  Denies fevers.

## 2016-11-02 NOTE — Discharge Instructions (Signed)
We have marked to your rash with skin pen. Monitor it and the rest of the night and tomorrow. If at any time becomes red, swollen, or if you develop a fever, or other signs of infection, then fill the prescription for the antibiotic I gave you.

## 2016-11-03 ENCOUNTER — Telehealth: Payer: Self-pay | Admitting: Neurology

## 2016-11-03 NOTE — Telephone Encounter (Signed)
Patient called office requesting refill for ADDERALL 30 MG tablet and amLODipine (NORVASC) 2.5 MG tablet we have never filled this medication before, patient does not have PCP but in the process of getting one.  Pharmacy- [place].  Please call

## 2016-11-03 NOTE — Telephone Encounter (Signed)
Called pt friend Phillip [name] back to inform her that the adderall was too soon to refill and that we didn't prescribe the norvasc and would not be able to do that one. Pt family member didn't answer. LVM to call back

## 2016-11-04 NOTE — Telephone Encounter (Signed)
Phillip [name] called office returning RN's call.  Please call

## 2016-11-04 NOTE — Telephone Encounter (Signed)
Sherrilee Gilles returned call. Was able to explain that the adderall was to early to refill. Requested that she call back on 11/10/2016 and we will re try to get that taking care of. I also mentioned that we were not the ordering provider for the pt's Norvasc and for her to contact the ordering physician to have that refilled. Pt friend verbalized understanding

## 2016-11-04 NOTE — Telephone Encounter (Signed)
Attempted to call pt or Ms [name] back. No one answered. LVM to call back

## 2016-11-06 ENCOUNTER — Other Ambulatory Visit: Payer: Self-pay | Admitting: Neurology

## 2016-11-06 DIAGNOSIS — R5383 Other fatigue: Secondary | ICD-10-CM

## 2016-11-06 DIAGNOSIS — S0990XS Unspecified injury of head, sequela: Secondary | ICD-10-CM

## 2016-11-06 NOTE — Telephone Encounter (Signed)
Noted call. Too early to fill at this time. Will work on this on Monday 7/9.

## 2016-11-06 NOTE — Telephone Encounter (Signed)
Pt friend calling for refill of ADDERALL 30 MG tablet

## 2016-11-10 ENCOUNTER — Telehealth: Payer: Self-pay | Admitting: Neurology

## 2016-11-10 MED ORDER — ADDERALL 30 MG PO TABS: 30.0000 mg | ORAL_TABLET | Freq: Two times a day (BID) | ORAL | 0 refills | Status: DC

## 2016-11-10 NOTE — Addendum Note (Signed)
Addended by: Darleen Crocker on: 11/10/2016 08:06 AM   Modules accepted: Orders

## 2016-11-10 NOTE — Telephone Encounter (Signed)
Called to let pt know that prescription was ready for pickup at the front desk

## 2016-11-12 ENCOUNTER — Encounter: Payer: Self-pay | Admitting: Family Medicine

## 2016-11-12 ENCOUNTER — Ambulatory Visit (INDEPENDENT_AMBULATORY_CARE_PROVIDER_SITE_OTHER): Payer: BLUE CROSS/BLUE SHIELD | Admitting: Family Medicine

## 2016-11-12 VITALS — BP 129/81 | HR 73 | Temp 97.7°F | Resp 20 | Ht 73.0 in | Wt 177.2 lb

## 2016-11-12 DIAGNOSIS — R4189 Other symptoms and signs involving cognitive functions and awareness: Secondary | ICD-10-CM | POA: Diagnosis not present

## 2016-11-12 DIAGNOSIS — E230 Hypopituitarism: Secondary | ICD-10-CM | POA: Diagnosis not present

## 2016-11-12 DIAGNOSIS — I7300 Raynaud's syndrome without gangrene: Secondary | ICD-10-CM

## 2016-11-12 DIAGNOSIS — Z7689 Persons encountering health services in other specified circumstances: Secondary | ICD-10-CM | POA: Diagnosis not present

## 2016-11-12 MED ORDER — AMLODIPINE BESYLATE 2.5 MG PO TABS: 2.5000 mg | ORAL_TABLET | Freq: Every day | ORAL | 1 refills | Status: DC

## 2016-11-12 NOTE — Patient Instructions (Addendum)
It was a pleasure to meet you today.  Follow up in 4 weeks for CPE and be fasting.   I have refilled your Amlodipine.     Please help [place] help you:  We are honored you have chosen [facility] for your Primary Care home. Below you will find basic instructions that you may need to access in the future. Please help [place] help you by reading the instructions, which cover many of the frequent questions we experience.   Prescription refills and request:  -In order to allow more efficient response time, please call your pharmacy for all refills. They will forward the request electronically to [place]. This allows for the quickest possible response. Request left on a nurse line can take longer to refill, since these are checked as time allows between office patients and other phone calls.  - refill request can take up to 3-5 working days to complete.  - If request is sent electronically and request is appropiate, it is usually completed in 1-2 business days.  - all patients will need to be seen routinely for all chronic medical conditions requiring prescription medications (see follow-up below). If you are overdue for follow up on your condition, you will be asked to make an appointment and we will call in enough medication to cover you until your appointment (up to 30 days).  - all controlled substances will require a face to face visit to request/refill.  - if you desire your prescriptions to go through a new pharmacy, and have an active script at original pharmacy, you will need to call your pharmacy and have scripts transferred to new pharmacy. This is completed between the pharmacy locations and not by your provider.    Results: If any images or labs were ordered, it can take up to 1 week to get results depending on the test ordered and the lab/facility running and resulting the test. - Normal or stable results, which do not need further discussion, may be released to your mychart immediately with  attached note to you. A call may not be generated for normal results. Please make certain to sign up for mychart. If you have questions on how to activate your mychart you can call the front office.  - If your results need further discussion, our office will attempt to contact you via phone, and if unable to reach you after 2 attempts, we will release your abnormal result to your mychart with instructions.  - All results will be automatically released in mychart after 1 week.  - Your provider will provide you with explanation and instruction on all relevant material in your results. Please keep in mind, results and labs may appear confusing or abnormal to the untrained eye, but it does not mean they are actually abnormal for you personally. If you have any questions about your results that are not covered, or you desire more detailed explanation than what was provided, you should make an appointment with your provider to do so.   Our office handles many outgoing and incoming calls daily. If we have not contacted you within 1 week about your results, please check your mychart to see if there is a message first and if not, then contact our office.  In helping with this matter, you help decrease call volume, and therefore allow [place] to be able to respond to patients needs more efficiently.   Acute office visits (sick visit):  An acute visit is intended for a new problem and are  scheduled in shorter time slots to allow schedule openings for patients with new problems. This is the appropriate visit to discuss a new problem. In order to provide you with excellent quality medical care with proper time for you to explain your problem, have an exam and receive treatment with instructions, these appointments should be limited to one new problem per visit. If you experience a new problem, in which you desire to be addressed, please make an acute office visit, we save openings on the schedule to accommodate you. Please do  not save your new problem for any other type of visit, let us take care of it properly and quickly for you.   Follow up visits:  Depending on your condition(s) your provider will need to see you routinely in order to provide you with quality care and prescribe medication(s). Most chronic conditions (Example: hypertension, Diabetes, depression/anxiety... etc), require visits a couple times a year. Your provider will instruct you on proper follow up for your personal medical conditions and history. Please make certain to make follow up appointments for your condition as instructed. Failing to do so could result in lapse in your medication treatment/refills. If you request a refill, and are overdue to be seen on a condition, we will always provide you with a 30 day script (once) to allow you time to schedule.    Medicare wellness (well visit): - we have a wonderful Nurse Maudie Mercury), that will meet with you and provide you will yearly medicare wellness visits. These visits should occur yearly (can not be scheduled less than 1 calendar year apart) and cover preventive health, immunizations, advance directives and screenings you are entitled to yearly through your medicare benefits. Do not miss out on your entitled benefits, this is when medicare will pay for these benefits to be ordered for you.  These are strongly encouraged by your provider and is the appropriate type of visit to make certain you are up to date with all preventive health benefits. If you have not had your medicare wellness exam in the last 12 months, please make certain to schedule one by calling the office and schedule your medicare wellness with Maudie Mercury as soon as possible.   Yearly physical (well visit):  - Adults are recommended to be seen yearly for physicals. Check with your insurance and date of your last physical, most insurances require one calendar year between physicals. Physicals include all preventive health topics, screenings, medical  exam and labs that are appropriate for gender/age and history. You may have fasting labs needed at this visit. This is a well visit (not a sick visit), new problems should not be covered during this visit (see acute visit).  - Pediatric patients are seen more frequently when they are younger. Your provider will advise you on well child visit timing that is appropriate for your their age. - This is not a medicare wellness visit. Medicare wellness exams do not have an exam portion to the visit. Some medicare companies allow for a physical, some do not allow a yearly physical. If your medicare allows a yearly physical you can schedule the medicare wellness with our nurse Maudie Mercury and have your physical with your provider after, on the same day. Please check with insurance for your full benefits.   Late Policy/No Shows:  - all new patients should arrive 15-30 minutes earlier than appointment to allow [place] time  to  obtain all personal demographics,  insurance information and for you to complete office paperwork. - All  established patients should arrive 10-15 minutes earlier than appointment time to update all information and be checked in .  - In our best efforts to run on time, if you are late for your appointment you will be asked to either reschedule or if able, we will work you back into the schedule. There will be a wait time to work you back in the schedule,  depending on availability.  - If you are unable to make it to your appointment as scheduled, please call 24 hours ahead of time to allow [place] to fill the time slot with someone else who needs to be seen. If you do not cancel your appointment ahead of time, you may be charged a no show fee.

## 2016-11-12 NOTE — Progress Notes (Signed)
Patient ID: Phillip [name], male  DOB: 28-Dec-1955, 61 y.o.   MRN: [id number] Patient Care Team    Relationship Specialty Notifications Start End  [name], DO PCP - General Family Medicine  11/12/16   [name], MD Consulting Physician Rheumatology  04/11/16   [name], MD Consulting Physician Internal Medicine  11/13/16    Comment: Katherine Basset, MD Consulting Physician Urology  11/13/16   [name], NP Registered Nurse Neurology  11/13/16    Comment: Dr. Rush Landmark, [name], MD Consulting Physician Cardiology  11/13/16     Chief Complaint  Patient presents with  . Establish Care    Subjective:  Phillip [name] is a 61 y.o.  male present for new patient establishment. All past medical history, surgical history, allergies, family history, immunizations, medications and social history were obtained and entered in the electronic medical record today. All recent labs, ED visits and hospitalizations within the last year were reviewed. He has a a rather extensive PMH. Not all records able to view and will need to be requested.   Raynaud's disease: Patient reports he was prescribed amlodipine 2.5 mg daily for his Raynaud's disease. He has had this medication today.  Patient has a significant medical history for ADD and anxiety which are both managed by neurology with medications. Patient is prescribed Adderall for his ADD and Ativan for his anxiety. 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    Depression screen [facility] 2/9 11/12/2016 08/29/2015 06/20/2015 01/03/2015 11/15/2014  Decreased Interest 0 0 0 0 0  Down, Depressed, Hopeless 0 1 0 0 0  PHQ - 2 Score 0 1 0 0 0  Altered sleeping - - - - -  Tired, decreased energy - - - - -  Change in appetite - -  - - -  Feeling bad or failure about yourself  - - - - -  Trouble concentrating - - - - -  Moving slowly or fidgety/restless - - - - -  Suicidal thoughts - - - - -  PHQ-9 Score - - - - -   No flowsheet data found.    Current Exercise Habits: The patient does not participate in regular exercise at present Exercise limited by: neurologic condition(s) Fall Risk  08/29/2015 06/20/2015 04/12/2014 10/19/2013  Falls in the past year? No No No No     Immunization History  Administered Date(s) Administered  . Tdap 05/05/2010    No exam data present  Past Medical History:  Diagnosis Date  . ADD (attention deficit disorder)    on adderrall managed by neurology  . Anxiety    on ativan managed by Neurology  . 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 Cellulitis   . Depression   . Meningoencephalitis 1994  . Pneumonia   . Raynaud's disease 09/13/2015  . Restless legs 09/13/2015  . [place] spotted fever 2017  . TBI (traumatic brain injury) ([place]) 1997, 2009   "concussions"   Allergies  Allergen Reactions  . Amphetamines     Intolerant to specific formulations: [employer] amphetamine TEVA dextroamp amphetamine  . Lorazepam     Mylan lorazepam; others ok  . Methylprednisolone   . Penicillins Hives  . Propofol     Cognitive delay and emotional   Past Surgical History:  Procedure Laterality  Date  . TEE WITHOUT CARDIOVERSION N/A 09/06/2015   Procedure: TRANSESOPHAGEAL ECHOCARDIOGRAM (TEE);  Surgeon: Jerline Pain, MD;  R/O endocarditis;  . TONSILLECTOMY  1965   Family History  Problem Relation Age of Onset  . Hyperlipidemia Father   . Heart disease Maternal Grandfather   . Cancer Paternal Grandfather   . Mental retardation Mother   . Mental illness Brother    Social History   Social History  . Marital status: Divorced    Spouse name: N/A  . Number of children: 2  . Years of education: 16   Occupational History  . Retired    Social History Main  Topics  . Smoking status: Former Smoker    Packs/day: 1.50    Years: 25.00    Types: Cigarettes    Quit date: 11/22/2014  . Smokeless tobacco: Never Used     Comment: Encouraged to remain smoke free  . Alcohol use No     Comment: 8 drinks  . Drug use: No  . Sexual activity: Yes    Partners: Female   Other Topics Concern  . Not on file   Social History Narrative   Divorced. B.A. degree. Retired.   Drinks caffeine, uses herbal remedies, takes a daily vitamin.   Wears his seatbelt.  Smoke detector in the home.   Firearms locked in the home.   Feels safe in relationships.    Allergies as of 11/12/2016      Reactions   Amphetamines    Intolerant to specific formulations: [employer] amphetamine TEVA dextroamp amphetamine   Lorazepam    Mylan lorazepam; others ok   Methylprednisolone    Penicillins Hives   Percocet [oxycodone-acetaminophen]    Pt says he is not allergic to this.   Propofol    Cognitive delay and emotional      Medication List       Accurate as of 11/12/16 11:59 PM. Always use your most recent med list.          ADDERALL 30 MG tablet Generic drug:  amphetamine-dextroamphetamine Take 1 tablet by mouth 2 (two) times daily.   amLODipine 2.5 MG tablet Commonly known as:  NORVASC Take 1 tablet (2.5 mg total) by mouth daily.   LORazepam 1 MG tablet Commonly known as:  ATIVAN Take 2 tablets (2 mg total) by mouth at bedtime.   TESTOSTERONE IM Inject 0.3 mg into the muscle.       All past medical history, surgical history, allergies, family history, immunizations andmedications were updated in the EMR today and reviewed under the history and medication portions of their EMR.     No results found.   ROS: 14 pt review of systems performed and negative (unless mentioned in an HPI)  Objective: BP 129/81 (BP Location: Right Arm, Patient Position: Sitting, Cuff Size: Large)   Pulse 73   Temp 97.7 F (36.5 C)   Resp 20   Ht 6\' 1"  (1.854 m)   Wt  177 lb 4 oz (80.4 kg)   SpO2 97%   BMI 23.39 kg/m  Gen: Afebrile. No acute distress. Nontoxic in appearance, well-developed, well-nourished,  Pleasant Caucasian male. HENT: AT. [place].MMM, no oral lesions, adequate dentition. No Cough on exam, no hoarseness on exam. Eyes:Pupils Equal Round Reactive to light, Extraocular movements intact,  Conjunctiva without redness, discharge or icterus. Neck/lymp/endocrine: Supple, no lymphadenopathy CV: RRR, no edema Chest: CTAB, no wheeze, rhonchi or crackles.  Abd: Soft.BS present.  Neuro/Msk: Normal gait. PERLA. EOMi. Alert. Oriented  x3.   Psych: Normal affect, dress and demeanor. Normal speech. Normal thought content and judgment.   Assessment/plan: GREG CRATTY is a 61 y.o. 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 He is well established with neurology. Who also manages his ADD and anxiety.  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  Return in about 4 weeks (around 12/10/2016) for CPE.   Note is dictated utilizing voice recognition software. Although note has been proof read prior to signing, occasional typographical errors still can be missed. If any questions arise, please do not hesitate to call for verification.  Electronically signed by: Howard Pouch, DO [place]

## 2016-11-13 ENCOUNTER — Encounter: Payer: Self-pay | Admitting: *Deleted

## 2016-11-17 DIAGNOSIS — N5201 Erectile dysfunction due to arterial insufficiency: Secondary | ICD-10-CM | POA: Diagnosis not present

## 2016-11-17 DIAGNOSIS — E291 Testicular hypofunction: Secondary | ICD-10-CM | POA: Diagnosis not present

## 2016-11-20 ENCOUNTER — Encounter: Payer: Self-pay | Admitting: Family Medicine

## 2016-11-20 DIAGNOSIS — H43811 Vitreous degeneration, right eye: Secondary | ICD-10-CM | POA: Diagnosis not present

## 2016-11-20 DIAGNOSIS — H52203 Unspecified astigmatism, bilateral: Secondary | ICD-10-CM | POA: Diagnosis not present

## 2016-12-09 ENCOUNTER — Other Ambulatory Visit: Payer: Self-pay | Admitting: Neurology

## 2016-12-09 ENCOUNTER — Ambulatory Visit (INDEPENDENT_AMBULATORY_CARE_PROVIDER_SITE_OTHER): Payer: BLUE CROSS/BLUE SHIELD | Admitting: Internal Medicine

## 2016-12-09 ENCOUNTER — Encounter: Payer: Self-pay | Admitting: Internal Medicine

## 2016-12-09 ENCOUNTER — Telehealth: Payer: Self-pay | Admitting: Neurology

## 2016-12-09 VITALS — BP 122/82 | HR 81 | Wt 181.0 lb

## 2016-12-09 DIAGNOSIS — S0990XS Unspecified injury of head, sequela: Secondary | ICD-10-CM

## 2016-12-09 DIAGNOSIS — R5383 Other fatigue: Secondary | ICD-10-CM

## 2016-12-09 DIAGNOSIS — N529 Male erectile dysfunction, unspecified: Secondary | ICD-10-CM | POA: Diagnosis not present

## 2016-12-09 DIAGNOSIS — E230 Hypopituitarism: Secondary | ICD-10-CM

## 2016-12-09 MED ORDER — ADDERALL 30 MG PO TABS: 30.0000 mg | ORAL_TABLET | Freq: Two times a day (BID) | ORAL | 0 refills | Status: DC

## 2016-12-09 NOTE — Patient Instructions (Addendum)
Please continue the Testosterone 0.3 ml every week.  Please come back for a testosterone level half-way between injections, at 8 am, fasting.  Please come back for a follow-up appointment in 6 months.

## 2016-12-09 NOTE — Telephone Encounter (Signed)
Called and spoke with the [name] about the patient's medication. Pt stated that the pt only responds well to certain brand. I made sure that was listed on the script for pick up and explained that she would have to ask around at the different pharmacies on who could get that ordered for them. Pt verbalized understanding and I informed her the script would be ready for pick up.

## 2016-12-09 NOTE — Progress Notes (Signed)
Patient ID: Phillip [name], male   DOB: Jun 07, 1955, 61 y.o.   MRN: [id number]   HPI: Phillip [name] is a 61 y.o.-year-old man, initially referred by his neurologist, Dr. [name], now returning for follow-up for hypogonadism. Last visit 9 months ago.  After last visit, we tried to decrease the testosterone dose to 0.3 mg weekly >> free testosterone was still normal. We then tried to decrease to 0.2 mg weekly in 03/2016 >> testosterone level was low >> we went back to 0.3 mg weekly in 09/2016.  He is still having hot flushes - 2-3x a day, not bothersome. He also c/o cold hands. Libido is fair. He has ED.  He is seeing Dr. Diona Fanti >> given Viagra. A DRE was recently normal.  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 He had a subsequent visit with his PCP in 09/2015 >> a testosterone level was found to be extremely low.   Received records from urology >> reviewed: Patient's initial free testosterone level obtained by PCP was 0.4 pg/mL (09/17/2015). At that time, he complained of low libido, testicular atrophy, difficulty obtaining and maintaining an erection. A repeat testosterone level obtained by an integrative medicine provider in [place] (09/25/2015) showed again very low testosterone level: Total Testosterone <3 ng/dL, and the free testosterone of 0.2 ng/dL. DHEAS was 122.4 (48.9-344.2). Of note, no LH and FSH levels were drawn at that time. At that point, he was started on testosterone cypionate 8 IM 0.8 mL every other week. This has not improved his libido significantly, but it helped him obtaining morning erections.  A genital exam was performed on 11/09/2015 by his urologist: Atrophic left and right testes. No tenderness, swelling, masses, varicocele, or hydrocele. No abnormality of the penis. Circumcised status. Prostate: 2+ in size, 40 g. Normal consistency  Dr. Diona Fanti obtain the  following labs - 11/09/2015 - collection time: 3:34 PM  Total testosterone 1125.7 (300-890) Free testosterone 175.3 (47-244) SHBG 66.3 (10-57)  FSH 0.1, LH 0.1 Prolactin 6.3 Of note, at the time of the above collection, patient was taking 0.8 mL testosterone every other week in his deltoid. The dose was changed at that time to 0.5 mL every week in his quadriceps, after which his fatigue improved a little, and he had more energy.   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 He saw Dr. [name], who obtained a  brain  + pituitary MRI (10/24/2015). 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 There was no abnormality in the pituitary gland.  He is interested in coming off testosterone if possible.  At last visit, he mentioned: He admits for previous decreased libido >> normal now Had difficulty obtaining or maintaining an erection >> not anymore No trauma to testes, testicular irradiation or surgery No h/o of mumps orchitis/h/o autoimmune ds. No h/o cryptorchidism He grew and went through puberty like his peers + shrinking of testes. No very small testes (<5 ml) No incomplete/delayed sexual development     No breast discomfort/gynecomastia    No loss of body hair (axillary/pubic)/decreased need for shaving No height loss No abnormal sense of smell  + hot flushes, resolved No vision problems, other than blurry vision in R eye, also photopsia - started 11/2015 No worst HA of his life He does have a  history of head trauma in 1997, when a tree fell on top of his head and he lost consciousness for more than 30 minutes; in 2009 he fell off ladder and landed on back of his head No FH of hypogonadism/infertility No personal h/o infertility - has 2 children: 6 and 26 years old  No FH of hemochromatosis or  pituitary tumors No excessive weight gain or loss.  No chronic diseases, but recently diagnosed with RMSF Spring 2017 No chronic pain. Not on opiates, does not take steroids, had a course this spring - ~1 week He was drinking Alcohol: ~2 drinks a day up to 2015 >>  stopped completely since then No anabolic steroids use No herbal medicines. Not on antidepressants  No AI ds in his family, no FH of MS. He does not have family history of early cardiac disease.  Reviewed pertinent labs: Component     Latest Ref Rng & Units 01/28/2016 03/31/2016 09/26/2016  Testosterone     264 - 916 ng/dL 1,103 (H) 762 243 (L)  Testosterone Free     7.2 - 24.0 pg/mL 15.1 8.5 2.8 (L)  Sex Horm Binding Glob, Serum     19.3 - 76.4 nmol/L 80.8 (H) 85.3 (H) 102.4 (H)   Prev: Component     Latest Ref Rng & Units 01/28/2016  Testosterone     264 - 916 ng/dL 1,103 (H)  Testosterone Free     7.2 - 24.0 pg/mL 15.1  Sex Horm Binding Glob, Serum     19.3 - 76.4 nmol/L 80.8 (H)  TSH     0.35 - 4.50 uIU/mL 1.90  Triiodothyronine,Free,Serum     2.3 - 4.2 pg/mL 3.9  T4,Free(Direct)     0.60 - 1.60 ng/dL 0.80  FSH     1.4 - 18.1 mIU/ML 0.1 (L)  Cortisol, Plasma     ug/dL 13.1  LH     1.50 - 9.30 mIU/mL 0.03 (L)  hCG Quant     0 - 3 mIU/mL <1  Total testosterone level is elevated, however free testosterone is normal. SHBG is high, which is desirable. Cortisol level is normal, no elevated beta hCG. Normal thyroid tests.  Component     Latest Ref Rng & Units 01/28/2016  IGF-I, LC/MS     50 - 317 ng/mL 140  Z-Score (Male)     -2.0 - 2.0 SD 0.1  Estradiol, Free     ADULTS: < OR = 0.45 pg/mL 1.06 (H)  Estradiol     ADULTS: < OR = 29 pg/mL 53 (H)  Prolactin     2.0 - 18.0 ng/mL 8.1   IGF-I is normal. Estrogen level is high, Likely secondary to testosterone therapy. Prolactin level is normal.  ROS: Constitutional: no weight gain/loss, no fatigue, + hot flushes Eyes: no blurry vision, no  xerophthalmia ENT: no sore throat, no nodules palpated in throat, no dysphagia/odynophagia, no hoarseness Cardiovascular: no CP/SOB/palpitations/leg swelling Respiratory: no cough/SOB Gastrointestinal: no N/V/D/C Musculoskeletal:  no muscle/joint aches Skin: no rashes Neurological: no tremors/numbness/tingling/dizziness  I reviewed pt's medications, allergies, PMH, social hx, family hx, and changes were documented in the history of present illness. Otherwise, unchanged from my initial visit note.  Past Medical History:  Diagnosis Date  . ADD (attention deficit disorder)    on adderrall managed by neurology  . Anxiety    on ativan managed by Neurology  . 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 Bartonella infection 2017   and  reported Ehrlichia  . Cellulitis   . Chronic traumatic encephalopathy    right frontal-temproal lobe  . Depression   . Erectile dysfunction 11/2016   follows with urology; sildenafil prescribed  . Meningoencephalitis 1994  . Pneumonia   . Primary hypogonadism in male   . Raynaud's disease 09/13/2015  . Restless legs 09/13/2015  . Rocky Mountain spotted fever 2017  . TBI (traumatic brain injury) ([place]) 1997, 2009   "concussions"   Past Surgical History:  Procedure Laterality Date  . TEE WITHOUT CARDIOVERSION N/A 09/06/2015   Procedure: TRANSESOPHAGEAL ECHOCARDIOGRAM (TEE);  Surgeon: Jerline Pain, MD;  R/O endocarditis;  . TONSILLECTOMY  1965   Social History   Social History  . Marital status: Single    Spouse name: N/A  . Number of children: 2   Occupational History  . n/a   Social History Main Topics  . Smoking status: Former Smoker    Packs/day: 1.50    Years: 25.00    Types: Cigarettes    Quit date: 2002  . Smokeless tobacco: Never Used     Comment: Encouraged to remain smoke free  . Alcohol use No     Comment: 8 drinks  . Drug use: No   Social History Narrative   Divorced. Education: [employer].    Current Outpatient Prescriptions on File Prior to Visit  Medication Sig Dispense Refill  . amLODipine (NORVASC) 2.5 MG tablet Take 1 tablet (2.5 mg total) by mouth daily. 90 tablet 1  . LORazepam (ATIVAN) 1 MG tablet Take 2 tablets (2 mg total) by mouth at bedtime. 60 tablet 5  . TESTOSTERONE IM Inject 0.3 mg into the muscle.     No current facility-administered medications on file prior to visit.    Allergies  Allergen Reactions  . Amphetamines     Intolerant to specific formulations: [employer] amphetamine TEVA dextroamp amphetamine  . Lorazepam     Mylan lorazepam; others ok  . Methylprednisolone   . Penicillins Hives  . Propofol     Cognitive delay and emotional   Family History  Problem Relation Age of Onset  . Hyperlipidemia Father   . Heart disease Maternal Grandfather   . Cancer Paternal Grandfather   . Mental retardation Mother   . Mental illness Brother    PE: BP 122/82 (BP Location: Left Arm, Patient Position: Sitting)   Pulse 81   Wt 181 lb (82.1 kg)   SpO2 97%   BMI 23.88 kg/m  Wt Readings from Last 3 Encounters:  12/09/16 181 lb (82.1 kg)  11/12/16 177 lb 4 oz (80.4 kg)  09/09/16 180 lb 3.2 oz (81.7 kg)   Constitutional: Normal weight , in NAD Eyes: PERRLA, EOMI, no exophthalmos ENT: moist mucous membranes, no thyromegaly, no cervical lymphadenopathy Cardiovascular: RRR, No MRG Respiratory: CTA B Gastrointestinal: abdomen soft, NT, ND, BS+ Musculoskeletal: no deformities, strength intact in all 4 Skin: moist, warm, no rashes Neurological: no tremor with outstretched hands, DTR normal in all 4  ASSESSMENT: 1. 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 Labs were drawn soon after diagnosis of RMSF, which, for him, was a fulminant, systemic, disease. Usually, the pituitary gland will shunt the production of testosterone or other hormones deemed "unnecessary" during acute illness. Testosterone  levels could be quite low in this situation. 2. Labs were drawn also after a prednisone course, but I do not have a clear time sequence 3. He has a history of head trauma x2,  which could have caused pituitary insufficiency 4. He has a history of alcohol use, however, he relates that he stopped in 2015  2. ED  PLAN:  1. 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 He was started on testosterone afterwards, and he was referred to me for further investigation. We checked his pituitary status and his pituitary hormones plus MRI were normal. His LH and FSH were low, as expected on testosterone treatment. At last visit, we started to decrease his testosterone supplementation to allow his pituitary gland to recover. We initially decreased his weekly dose of testosterone cypionate to 0.3 mg, after which, The free testosterone was still normal, although lower in the normal range. However, after we tried to decrease to 0.2 mg weekly, his free testosterone plummeted below the lower limit of normal. At that time, in 09/2016, I advised him to go back to 0.3 mg weekly. He continues this now.  we discussed about repeating his testosterone level fasting, at 8 AM, and then tried to decrease the dose further if possible, to alternating 0.2 with 0.3 mg weekly. They agreed with the plan. I did explain that if testosterone levels remain low, he may need to stay on his current supplementation.  - we also discussed about Clomid, but I do not feel that this would be a good option for him at this moment.I explained the mechanism of action, and also the fact that we may be able to use this in the future especially since his  estrogen levels were slightly high. - When he returns for labs, I will check a PSA and CBC, also - will see him back in 6 mo  Orders Placed This Encounter  Procedures  . Testosterone, Free, Total, SHBG  . PSA  . CBC   2.ED - he was just started on PDE 5 inhibitor by Dr. Diona Fanti -  he tried this once and tolerated it well  Component     Latest Ref Rng & Units 12/19/2016          WBC     4.0 - 10.5 K/uL 5.9  RBC     4.22 - 5.81 Mil/uL 4.82  Hemoglobin     13.0 - 17.0 g/dL 14.6  HCT     39.0 - 52.0 % 43.8  MCV     78.0 - 100.0 fl 91.0  MCH     26.0 - 34.0 pg   MCHC     30.0 - 36.0 g/dL 33.4  RDW     11.5 - 15.5 % 12.6  Platelets     150.0 - 400.0 K/uL 286.0  Testosterone     264 - 916 ng/dL 974 (H)  Testosterone Free     7.2 - 24.0 pg/mL 11.6  Sex Horm Binding Glob, Serum     19.3 - 76.4 nmol/L 93.5 (H)  PSA     0.10 - 4.00 ng/mL 0.59  Normal Hb, PSA. Normal free testosterone >> will try to reduce the dose of his testosterone injection to 0.2 alternating with 0.3 mg weekly.  [name], MD PhD [facility] Endocrinology

## 2016-12-09 NOTE — Telephone Encounter (Signed)
Pt friend(on DPR)calling in need of refill ADDERALL 30 MG tablet for pt. The pharmacy that pt uses and the mfg that the medication comes from is not available.  Pt friend stating that pt doesn't do well with the medication coming from other mfg's.  She'd like to know if there is another pharmacy that uses the same mfg that  [id number] - [place], [place] - [address] [phone] (Phone) [phone] (Fax)   Does, please call [name]

## 2016-12-10 ENCOUNTER — Encounter: Payer: Self-pay | Admitting: Family Medicine

## 2016-12-10 ENCOUNTER — Ambulatory Visit (INDEPENDENT_AMBULATORY_CARE_PROVIDER_SITE_OTHER): Payer: BLUE CROSS/BLUE SHIELD | Admitting: Family Medicine

## 2016-12-10 VITALS — BP 131/86 | HR 65 | Temp 97.7°F | Resp 20 | Ht 73.0 in | Wt 181.5 lb

## 2016-12-10 DIAGNOSIS — Z13220 Encounter for screening for lipoid disorders: Secondary | ICD-10-CM

## 2016-12-10 DIAGNOSIS — Z131 Encounter for screening for diabetes mellitus: Secondary | ICD-10-CM | POA: Diagnosis not present

## 2016-12-10 DIAGNOSIS — Z0000 Encounter for general adult medical examination without abnormal findings: Secondary | ICD-10-CM | POA: Diagnosis not present

## 2016-12-10 DIAGNOSIS — Z125 Encounter for screening for malignant neoplasm of prostate: Secondary | ICD-10-CM

## 2016-12-10 DIAGNOSIS — Z130 Encounter for screening for diseases of the blood and blood-forming organs and certain disorders involving the immune mechanism: Secondary | ICD-10-CM | POA: Diagnosis not present

## 2016-12-10 DIAGNOSIS — Z79899 Other long term (current) drug therapy: Secondary | ICD-10-CM | POA: Diagnosis not present

## 2016-12-10 LAB — CBC WITH DIFFERENTIAL/PLATELET
Basophils Absolute: 0.1 10*3/uL (ref 0.0–0.1)
Basophils Relative: 0.9 % (ref 0.0–3.0)
EOS PCT: 3.3 % (ref 0.0–5.0)
Eosinophils Absolute: 0.2 10*3/uL (ref 0.0–0.7)
HCT: 43.5 % (ref 39.0–52.0)
HEMOGLOBIN: 14.5 g/dL (ref 13.0–17.0)
Lymphocytes Relative: 33.8 % (ref 12.0–46.0)
Lymphs Abs: 2.1 10*3/uL (ref 0.7–4.0)
MCHC: 33.3 g/dL (ref 30.0–36.0)
MCV: 91.2 fl (ref 78.0–100.0)
MONOS PCT: 8.9 % (ref 3.0–12.0)
Monocytes Absolute: 0.6 10*3/uL (ref 0.1–1.0)
Neutro Abs: 3.3 10*3/uL (ref 1.4–7.7)
Neutrophils Relative %: 53.1 % (ref 43.0–77.0)
Platelets: 304 10*3/uL (ref 150.0–400.0)
RBC: 4.76 Mil/uL (ref 4.22–5.81)
RDW: 12.8 % (ref 11.5–15.5)
WBC: 6.3 10*3/uL (ref 4.0–10.5)

## 2016-12-10 LAB — COMPREHENSIVE METABOLIC PANEL
ALBUMIN: 4.5 g/dL (ref 3.5–5.2)
ALT: 20 U/L (ref 0–53)
AST: 23 U/L (ref 0–37)
Alkaline Phosphatase: 84 U/L (ref 39–117)
BUN: 17 mg/dL (ref 6–23)
CALCIUM: 9.5 mg/dL (ref 8.4–10.5)
CO2: 32 mEq/L (ref 19–32)
Chloride: 103 mEq/L (ref 96–112)
Creatinine, Ser: 0.85 mg/dL (ref 0.40–1.50)
GFR: 97.94 mL/min (ref >60.00)
Glucose, Bld: 95 mg/dL (ref 70–99)
POTASSIUM: 5.3 meq/L — AB (ref 3.5–5.1)
Sodium: 136 mEq/L (ref 135–145)
TOTAL PROTEIN: 6.7 g/dL (ref 6.0–8.3)
Total Bilirubin: 0.8 mg/dL (ref 0.2–1.2)

## 2016-12-10 LAB — LIPID PANEL
Cholesterol: 215 mg/dL — ABNORMAL HIGH (ref 0–200)
HDL: 52.2 mg/dL (ref >39.00)
LDL Cholesterol: 150 mg/dL — ABNORMAL HIGH (ref 0–99)
NONHDL: 163.18
TRIGLYCERIDES: 65 mg/dL (ref 0.0–149.0)
Total CHOL/HDL Ratio: 4
VLDL: 13 mg/dL (ref 0.0–40.0)

## 2016-12-10 LAB — HEMOGLOBIN A1C: HEMOGLOBIN A1C: 5.5 % (ref 4.6–6.5)

## 2016-12-10 LAB — PSA: PSA: 0.72 ng/mL (ref 0.10–4.00)

## 2016-12-10 NOTE — Patient Instructions (Signed)

## 2016-12-10 NOTE — Progress Notes (Signed)
Patient ID: Phillip [name], male  DOB: 02-Jun-1955, 61 y.o.   MRN: [id number] Patient Care Team    Relationship Specialty Notifications Start End  Phillip Hillock, DO PCP - General Family Medicine  11/12/16   Phillip Cisco, MD Consulting Physician Rheumatology  04/11/16   Phillip Kingdom, MD Consulting Physician Internal Medicine  11/13/16    Comment: Testosterone  Phillip Gallo, MD Consulting Physician Urology  11/13/16   Phillip Givens, NP Registered Nurse Neurology  11/13/16    Comment: Phillip [name], Phillip Farr, MD Consulting Physician Cardiology  11/13/16   Phillip Ada, MD Consulting Physician Gastroenterology  12/10/16     Chief Complaint  Patient presents with  . Annual Exam    Subjective:  Phillip [name] is a 61 y.o. male present for CPE. All past medical history, surgical history, allergies, family history, immunizations, medications and social history were updated in the electronic medical record today. All recent labs, ED visits and hospitalizations within the last year were reviewed.  Health maintenance:  Colonoscopy: last screen 07/2014, recommend follow up 5 year; resulted 2 polyps and diverticulosis.. Completed by Dr. Benson [name]. Immunizations:  tdap 2012 UTD, influenza (not a candidate),  Shingrix 2 part series discussed. He will think about it and discuss with neurologist as well.  Infectious disease screening: HIV declined , Hep C completed.  PSA:  Lab Results  Component Value Date   PSA 0.57 04/12/2014   PSA 0.57 03/16/2013  , pt was counseled on prostate cancer screenings. PSA ordered today. Patient is also on testosterone supplementation. Assistive device: None Oxygen use: none Patient has a Dental home. Hospitalizations/ED visits: none  Depression screen Phillip [name] 2/9 12/10/2016 11/12/2016 08/29/2015 06/20/2015 01/03/2015  Decreased Interest 0 0 0 0 0  Down, Depressed, Hopeless 0 0 1 0 0  PHQ - 2 Score 0 0 1 0 0  Altered sleeping - - - - -  Tired, decreased  energy - - - - -  Change in appetite - - - - -  Feeling bad or failure about yourself  - - - - -  Trouble concentrating - - - - -  Moving slowly or fidgety/restless - - - - -  Suicidal thoughts - - - - -  PHQ-9 Score - - - - -   No flowsheet data found.   Current Exercise Habits: The patient has a physically strenous job, but has no regular exercise apart from work.   Fall Risk  12/10/2016 08/29/2015 06/20/2015 04/12/2014 10/19/2013  Falls in the past year? _0       Immunization History  Administered Date(s) Administered  . Tdap 05/05/2010     Past Medical History:  Diagnosis Date  . ADD (attention deficit disorder)    on adderrall managed by neurology  . Anxiety    on ativan managed by Neurology  . 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 Cellulitis   . Chronic traumatic encephalopathy    right frontal-temproal lobe  . Depression   . Erectile dysfunction 11/2016   follows with urology; sildenafil prescribed  . Meningoencephalitis 1994  . Pneumonia   . Primary hypogonadism in male   . Raynaud's disease 09/13/2015  . Restless legs 09/13/2015  . Rocky Mountain spotted fever 2017  . TBI (traumatic brain injury) ([place]) 1997, 2009   "concussions"   Allergies  Allergen Reactions  .  Amphetamines     Intolerant to specific formulations: [employer] amphetamine TEVA dextroamp amphetamine  . Lorazepam     Mylan lorazepam; others ok  . Methylprednisolone   . Penicillins Hives  . Propofol     Cognitive delay and emotional   Past Surgical History:  Procedure Laterality Date  . TEE WITHOUT CARDIOVERSION N/A 09/06/2015   Procedure: TRANSESOPHAGEAL ECHOCARDIOGRAM (TEE);  Surgeon: Phillip Pain, MD;  R/O endocarditis;  . TONSILLECTOMY  1965   Family History  Problem Relation Age of Onset  . Hyperlipidemia Father   . Heart disease Maternal Grandfather   . Cancer Paternal  Grandfather   . Mental retardation Mother   . Mental illness Brother    Social History   Social History  . Marital status: Divorced    Spouse name: N/A  . Number of children: 2  . Years of education: 16   Occupational History  . Retired    Social History Main Topics  . Smoking status: Former Smoker    Packs/day: 1.50    Years: 25.00    Types: Cigarettes    Quit date: 11/22/2014  . Smokeless tobacco: Never Used     Comment: Encouraged to remain smoke free  . Alcohol use No     Comment: 8 drinks  . Drug use: No  . Sexual activity: Yes    Partners: Female   Other Topics Concern  . Not on file   Social History Narrative   Divorced. B.A. degree. Retired.   Drinks caffeine, uses herbal remedies, takes a daily vitamin.   Wears his seatbelt.  Smoke detector in the home.   Firearms locked in the home.   Feels safe in relationships.    Allergies as of 12/10/2016      Reactions   Amphetamines    Intolerant to specific formulations: [employer] amphetamine TEVA dextroamp amphetamine   Lorazepam    Mylan lorazepam; others ok   Methylprednisolone    Penicillins Hives   Propofol    Cognitive delay and emotional      Medication List       Accurate as of 12/10/16 12:09 PM. Always use your most recent med list.          ADDERALL 30 MG tablet Generic drug:  amphetamine-dextroamphetamine Take 1 tablet by mouth 2 (two) times daily.   amLODipine 2.5 MG tablet Commonly known as:  NORVASC Take 1 tablet (2.5 mg total) by mouth daily.   LORazepam 1 MG tablet Commonly known as:  ATIVAN Take 2 tablets (2 mg total) by mouth at bedtime.   TESTOSTERONE IM Inject 0.3 mg into the muscle.      All past medical history, surgical history, allergies, family history, immunizations andmedications were updated in the EMR today and reviewed under the history and medication portions of their EMR.     Recent Results (from the past 2160 hour(s))  Testosterone, Free, Total, SHBG      Status: Abnormal   Collection Time: 09/26/16  8:32 AM  Result Value Ref Range   Testosterone 243 (L) 264 - 916 ng/dL    Comment: Adult male reference interval is based on a population of healthy nonobese males (BMI <30) between 40 and 52 years old. [place], [place] [phone]. PMID: [id number].    Testosterone, Free 2.8 (L) 7.2 - 24.0 pg/mL   Sex Hormone Binding 102.4 (H) 19.3 - 76.4 nmol/L    No results found.   ROS: 14 pt review of systems performed and negative (unless  mentioned in an HPI)  Objective: BP 131/86 (BP Location: Left Arm, Patient Position: Sitting, Cuff Size: Large)   Pulse 65   Temp 97.7 F (36.5 C)   Resp 20   Ht _0  (1.854 m)   Wt 181 lb 8 oz (82.3 kg)   SpO2 99%   BMI 23.95 kg/m  Gen: Afebrile. No acute distress. Nontoxic in appearance, well-developed, well-nourished,  Very pleasant Caucasian male. HENT: AT. [place]. Bilateral TM visualized and normal in appearance, normal external auditory canal. MMM, no oral lesions, adequate dentition. Bilateral nares within normal limits. Throat without erythema, ulcerations or exudates. No Cough on exam, no hoarseness on exam. Eyes:Pupils Equal Round Reactive to light, Extraocular movements intact,  Conjunctiva without redness, discharge or icterus. Neck/lymp/endocrine: Supple, no lymphadenopathy, no thyromegaly CV: RRR no murmur, no edema, +2/4 P posterior tibialis pulses. No carotid bruits. No JVD. Chest: CTAB, no wheeze, rhonchi or crackles. Normal Respiratory effort. Good Air movement. Abd: Soft. Flat. NTND. BS present. No Masses palpated. No hepatosplenomegaly. No rebound tenderness or guarding. Skin: No rashes, purpura or petechiae. Warm and well-perfused. Skin intact. Neuro/Msk:  Normal gait. PERLA. EOMi. Alert. Oriented x3.  Cranial nerves II through XII intact. Muscle strength 5/5 upper/lower extremity. DTRs equal bilaterally. Psych: Normal affect, dress and demeanor. Normal speech. Normal thought content  and judgment.  No exam data present  Assessment/plan: GERGORY BIELLO is a 61 y.o. male present for CPE Screening for iron deficiency anemia - CBC w/Diff-->, also on testosterone therapy, should be monitored routinely Encounter for long-term current use of medication - Comp Met (CMET) Screening for diabetes mellitus - HgB A1c Lipid screening - Lipid panel--> also on testosterone therapy.  Prostate cancer screening - PSA--> testosterone therapy.  Encounter for preventive health examination Patient was encouraged to exercise greater than 150 minutes a week. Patient was encouraged to choose a diet filled with fresh fruits and vegetables, and lean meats. AVS provided to patient today for education/recommendation on gender specific health and safety maintenance. Colonoscopy: Due 2021 for 5 yr recall. Dr. Benson [name]. Immunizations:  tdap 2012 UTD, influenza (not a candidate),  Shingrix 2 part series discussed. He will think about it and discuss with neurologist as well.  Infectious disease screening: HIV declined , Hep C completed in the past. Return in about 1 year (around 12/10/2017) for CPE.  Note is dictated utilizing voice recognition software. Although note has been proof read prior to signing, occasional typographical errors still can be missed. If any questions arise, please do not hesitate to call for verification.  Electronically signed by: Howard Pouch, DO [place]

## 2016-12-11 ENCOUNTER — Telehealth: Payer: Self-pay | Admitting: Family Medicine

## 2016-12-11 NOTE — Telephone Encounter (Signed)
Patient's wife notified and verbalized understanding.  Okay Per DPR.

## 2016-12-11 NOTE — Telephone Encounter (Signed)
Please call patient: His labs all look good, with the exception of very mildly elevated cholesterol. I would not recommend a prescribed medication at this time. Would encourage monitoring his diet a little closer, eating higher fiber content and low in saturated fat. Increasing exercise as tolerated. He could also consider starting a fish oil supplementation if he can tolerate it.

## 2016-12-19 ENCOUNTER — Other Ambulatory Visit (INDEPENDENT_AMBULATORY_CARE_PROVIDER_SITE_OTHER): Payer: BLUE CROSS/BLUE SHIELD

## 2016-12-19 DIAGNOSIS — E230 Hypopituitarism: Secondary | ICD-10-CM

## 2016-12-19 LAB — CBC
HCT: 43.8 % (ref 39.0–52.0)
Hemoglobin: 14.6 g/dL (ref 13.0–17.0)
MCHC: 33.4 g/dL (ref 30.0–36.0)
MCV: 91 fl (ref 78.0–100.0)
Platelets: 286 10*3/uL (ref 150.0–400.0)
RBC: 4.82 Mil/uL (ref 4.22–5.81)
RDW: 12.6 % (ref 11.5–15.5)
WBC: 5.9 10*3/uL (ref 4.0–10.5)

## 2016-12-19 LAB — PSA: PSA: 0.59 ng/mL (ref 0.10–4.00)

## 2016-12-22 LAB — TESTOSTERONE, FREE, TOTAL, SHBG
SEX HORMONE BINDING: 93.5 nmol/L — AB (ref 19.3–76.4)
TESTOSTERONE FREE: 11.6 pg/mL (ref 7.2–24.0)
TESTOSTERONE: 974 ng/dL — AB (ref 264–916)

## 2017-01-07 ENCOUNTER — Telehealth: Payer: Self-pay | Admitting: Adult Health

## 2017-01-07 DIAGNOSIS — R5383 Other fatigue: Secondary | ICD-10-CM

## 2017-01-07 DIAGNOSIS — S0990XS Unspecified injury of head, sequela: Secondary | ICD-10-CM

## 2017-01-07 MED ORDER — ADDERALL 30 MG PO TABS: 30.0000 mg | ORAL_TABLET | Freq: Two times a day (BID) | ORAL | 0 refills | Status: DC

## 2017-01-07 NOTE — Telephone Encounter (Signed)
Patient's friend [name] requesting refill of  ADDERALL 30 MG tablet for the patient.

## 2017-01-07 NOTE — Telephone Encounter (Signed)
Placed refill order, CD

## 2017-01-07 NOTE — Addendum Note (Signed)
Addended by: Larey Seat on: 01/07/2017 04:03 PM   Modules accepted: Orders

## 2017-02-09 ENCOUNTER — Telehealth: Payer: Self-pay | Admitting: Neurology

## 2017-02-09 ENCOUNTER — Other Ambulatory Visit: Payer: Self-pay | Admitting: Neurology

## 2017-02-09 DIAGNOSIS — R5383 Other fatigue: Secondary | ICD-10-CM

## 2017-02-09 DIAGNOSIS — S0990XS Unspecified injury of head, sequela: Secondary | ICD-10-CM

## 2017-02-09 MED ORDER — ADDERALL 30 MG PO TABS: 30.0000 mg | ORAL_TABLET | Freq: Two times a day (BID) | ORAL | 0 refills | Status: DC

## 2017-02-09 NOTE — Telephone Encounter (Signed)
Script will be ready for pick up after lunch today

## 2017-02-09 NOTE — Telephone Encounter (Signed)
Pt calling for refill of ADDERALL 30 MG tablet

## 2017-03-10 ENCOUNTER — Other Ambulatory Visit: Payer: Self-pay | Admitting: Adult Health

## 2017-03-10 ENCOUNTER — Telehealth: Payer: Self-pay | Admitting: Adult Health

## 2017-03-10 DIAGNOSIS — S0990XS Unspecified injury of head, sequela: Secondary | ICD-10-CM

## 2017-03-10 DIAGNOSIS — R5383 Other fatigue: Secondary | ICD-10-CM

## 2017-03-10 NOTE — Telephone Encounter (Signed)
Patient's friend requesting refill of LORazepam (ATIVAN) 1 MG tablet.

## 2017-03-10 NOTE — Telephone Encounter (Signed)
Pt's friend request refill for ADDERALL 30 MG tablet

## 2017-03-11 MED ORDER — ADDERALL 30 MG PO TABS: 30.0000 mg | ORAL_TABLET | Freq: Two times a day (BID) | ORAL | 0 refills | Status: DC

## 2017-03-11 NOTE — Addendum Note (Signed)
Addended by: Trudie Buckler on: 03/11/2017 04:30 PM   Modules accepted: Orders

## 2017-03-11 NOTE — Telephone Encounter (Signed)
Pt's friend called inquiring if meds are ready to pick. Please call to confirm when adderall and clonazepam are ready to pick up. Thank you

## 2017-03-11 NOTE — Telephone Encounter (Signed)
Adderall printed. [place] drug registry checked.   He was suppose to decrease 1/2 tablet (0.5 mg ) of ativan. Please call patient to ensure that he is taking this way and then I will send in prescription.

## 2017-03-11 NOTE — Telephone Encounter (Addendum)
LVM for friend Phillip [name], on [place] and informed her Adderall Rx is ready for pick up. Megan NP came into RN pod; discussed request for Ativan refill with her. Called Phillip [name] back to ask how patient is taking since he had discussed tapering off at last office visit with NP. Phillip [name] stated he was unable to taper off, even with going down to 14/ tab. This RN advised will let NP know tomorrow and Rx can be faxed to pharmacy. Reminded her to pick up Adderall Rx. She verbalized understanding, appreciation of call.

## 2017-03-12 ENCOUNTER — Other Ambulatory Visit: Payer: Self-pay | Admitting: Adult Health

## 2017-03-12 MED ORDER — LORAZEPAM 1 MG PO TABS: 2.0000 mg | ORAL_TABLET | Freq: Every day | ORAL | 5 refills | Status: DC

## 2017-03-12 NOTE — Telephone Encounter (Signed)
Ativan refill Rx successfully faxed to [facility], [place].

## 2017-03-13 ENCOUNTER — Encounter: Payer: Self-pay | Admitting: Family Medicine

## 2017-03-13 ENCOUNTER — Ambulatory Visit: Payer: BLUE CROSS/BLUE SHIELD | Admitting: Family Medicine

## 2017-03-13 VITALS — BP 130/84 | HR 63 | Temp 97.8°F | Resp 20 | Wt 188.0 lb

## 2017-03-13 DIAGNOSIS — J40 Bronchitis, not specified as acute or chronic: Secondary | ICD-10-CM | POA: Diagnosis not present

## 2017-03-13 MED ORDER — AZITHROMYCIN 250 MG PO TABS
ORAL_TABLET | ORAL | 0 refills | Status: DC
Start: 2017-03-13 — End: 2017-03-17

## 2017-03-13 NOTE — Progress Notes (Signed)
Phillip [name] , [birth year], 61 y.o., male MRN: [id number] Patient Care Team    Relationship Specialty Notifications Start End  [name], DO PCP - General Family Medicine  11/12/16   [name], MD Consulting Physician Rheumatology  04/11/16   [name], MD Consulting Physician Internal Medicine  11/13/16    Comment: Testosterone  [name], MD Consulting Physician Urology  11/13/16   [name], NP Registered Nurse Neurology  11/13/16    Comment: Dr. Rush Landmark, [name], MD Consulting Physician Cardiology  11/13/16   [name], MD Consulting Physician Gastroenterology  12/10/16     Chief Complaint  Patient presents with  . URI    congestion,yellow sputum x 10 days     Subjective: Pt presents for an OV with complaints of chest burning cough, that is productive with yellow sputum, nausea, nasal congestion and sore throat. He admits to mild nausea as well. He denies fever, chills, diarrhea or rash. He has tried nothing over-the-counter. He is concerned because this has been present for 10 days and he is prone to pneumonia.  Depression screen [facility] 2/9 12/10/2016 11/12/2016 08/29/2015 06/20/2015 01/03/2015  Decreased Interest 0 0 0 0 0  Down, Depressed, Hopeless 0 0 1 0 0  PHQ - 2 Score 0 0 1 0 0  Altered sleeping - - - - -  Tired, decreased energy - - - - -  Change in appetite - - - - -  Feeling bad or failure about yourself  - - - - -  Trouble concentrating - - - - -  Moving slowly or fidgety/restless - - - - -  Suicidal thoughts - - - - -  PHQ-9 Score - - - - -    Allergies  Allergen Reactions  . Amphetamines     Intolerant to specific formulations: [employer] amphetamine TEVA dextroamp amphetamine  . Lorazepam     Mylan lorazepam; others ok  . Methylprednisolone   . Penicillins Hives  . Propofol     Cognitive delay and emotional   Social History   Tobacco Use  . Smoking status: Former Smoker    Packs/day: 1.50    Years: 25.00   Pack years: 37.50    Types: Cigarettes    Last attempt to quit: 11/22/2014    Years since quitting: 2.3  . Smokeless tobacco: Never Used  . Tobacco comment: Encouraged to remain smoke free  Substance Use Topics  . Alcohol use: No    Alcohol/week: 0.0 oz    Comment: 8 drinks   Past Medical History:  Diagnosis Date  . ADD (attention deficit disorder)    on adderrall managed by neurology  . Anxiety    on ativan managed by Neurology  . 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 Cellulitis   . Chronic traumatic encephalopathy    right frontal-temproal lobe  . Depression   . Erectile dysfunction 11/2016   follows with urology; sildenafil prescribed  . Meningoencephalitis 1994  . Pneumonia   . Primary hypogonadism in male   . Raynaud's disease 09/13/2015  . Restless legs 09/13/2015  . [place] spotted fever 2017  . TBI (traumatic brain injury) ([place]) 1997, 2009   "concussions"   Past Surgical History:  Procedure Laterality Date  . TONSILLECTOMY  1965   Family History  Problem Relation Age of Onset  . Hyperlipidemia Father   .  Heart disease Maternal Grandfather   . Cancer Paternal Grandfather   . Mental retardation Mother   . Mental illness Brother    Allergies as of 03/13/2017      Reactions   Amphetamines    Intolerant to specific formulations: [employer] amphetamine TEVA dextroamp amphetamine   Lorazepam    Mylan lorazepam; others ok   Methylprednisolone    Penicillins Hives   Propofol    Cognitive delay and emotional      Medication List        Accurate as of 03/13/17  1:48 PM. Always use your most recent med list.          ADDERALL 30 MG tablet Generic drug:  amphetamine-dextroamphetamine Take 1 tablet 2 (two) times daily by mouth.   amLODipine 2.5 MG tablet Commonly known as:  NORVASC Take 1 tablet (2.5 mg total) by mouth daily.   LORazepam 1 MG  tablet Commonly known as:  ATIVAN Take 2 tablets (2 mg total) at bedtime by mouth.   TESTOSTERONE IM Inject 0.2-0.3 mg into the muscle once a week.       All past medical history, surgical history, allergies, family history, immunizations andmedications were updated in the EMR today and reviewed under the history and medication portions of their EMR.     ROS: Negative, with the exception of above mentioned in HPI   Objective:  BP 130/84 (BP Location: Right Arm, Patient Position: Sitting, Cuff Size: Large)   Pulse 63   Temp 97.8 F (36.6 C)   Resp 20   Wt 188 lb (85.3 kg)   SpO2 96%   BMI 24.80 kg/m  Body mass index is 24.8 kg/m. Gen: Afebrile. No acute distress. Nontoxic in appearance, well developed, well nourished. Pleasant Caucasian male. HENT: AT. [place]. Bilateral TM visualized without erythema or bulging. MMM, no oral lesions. Bilateral nares mild erythema, mild swelling, no drainage. Throat without erythema or exudates. Postnasal drip present, cough present, hoarseness present. No sinus pressure. Eyes:Pupils Equal Round Reactive to light, Extraocular movements intact,  Conjunctiva with redness, no discharge or icterus. Neck/lymp/endocrine: Supple, no lymphadenopathy CV: RRR no murmur, no edema Chest: CTAB, no wheeze or crackles. Good air movement, normal resp effort.  Neuro:  Normal gait. PERLA. EOMi. Alert. Oriented x3  No exam data present No results found. No results found for this or any previous visit (from the past 24 hour(s)).  Assessment/Plan: Phillip [name] is a 60 y.o. male present for OV for  Bronchitis Rest, hydrate. Z-Pak. Start Flonase over-the-counter. Possibly viral versus allergy initially with bilateral eyes itchy and red. - Follow-up in 2 weeks if not resolved.    Reviewed expectations re: course of current medical issues.  Discussed self-management of symptoms.  Outlined signs and symptoms indicating need for more acute intervention.  Patient  verbalized understanding and all questions were answered.  Patient received an After-Visit Summary.    No orders of the defined types were placed in this encounter.    Note is dictated utilizing voice recognition software. Although note has been proof read prior to signing, occasional typographical errors still can be missed. If any questions arise, please do not hesitate to call for verification.   electronically signed by:  Howard Pouch, DO  [place]

## 2017-03-13 NOTE — Patient Instructions (Signed)
Start z-pack today and take as directed for 5 days.  Start flonase nasal spray (over the counter) as instructions dictate. This will help with allergy aspect, use for at least 2 weeks.       Acute Bronchitis, Adult Acute bronchitis is when air tubes (bronchi) in the lungs suddenly get swollen. The condition can make it hard to breathe. It can also cause these symptoms:  A cough.  Coughing up clear, yellow, or green mucus.  Wheezing.  Chest congestion.  Shortness of breath.  A fever.  Body aches.  Chills.  A sore throat.  Follow these instructions at home: Medicines  Take over-the-counter and prescription medicines only as told by your doctor.  If you were prescribed an antibiotic medicine, take it as told by your doctor. Do not stop taking the antibiotic even if you start to feel better. General instructions  Rest.  Drink enough fluids to keep your pee (urine) clear or pale yellow.  Avoid smoking and secondhand smoke. If you smoke and you need help quitting, ask your doctor. Quitting will help your lungs heal faster.  Use an inhaler, cool mist vaporizer, or humidifier as told by your doctor.  Keep all follow-up visits as told by your doctor. This is important. How is this prevented? To lower your risk of getting this condition again:  Wash your hands often with soap and water. If you cannot use soap and water, use hand sanitizer.  Avoid contact with people who have cold symptoms.  Try not to touch your hands to your mouth, nose, or eyes.  Make sure to get the flu shot every year.  Contact a doctor if:  Your symptoms do not get better in 2 weeks. Get help right away if:  You cough up blood.  You have chest pain.  You have very bad shortness of breath.  You become dehydrated.  You faint (pass out) or keep feeling like you are going to pass out.  You keep throwing up (vomiting).  You have a very bad headache.  Your fever or chills gets  worse. This information is not intended to replace advice given to you by your health care provider. Make sure you discuss any questions you have with your health care provider. Document Released: 10/08/2007 Document Revised: 11/28/2015 Document Reviewed: 10/10/2015 Elsevier Interactive Patient Education  2017 [employer].

## 2017-03-16 ENCOUNTER — Ambulatory Visit: Payer: BLUE CROSS/BLUE SHIELD | Admitting: Neurology

## 2017-03-16 ENCOUNTER — Encounter: Payer: Self-pay | Admitting: Neurology

## 2017-03-16 VITALS — BP 141/83 | HR 69 | Ht 73.0 in | Wt 186.0 lb

## 2017-03-16 DIAGNOSIS — R5383 Other fatigue: Secondary | ICD-10-CM

## 2017-03-16 DIAGNOSIS — T387X1A Poisoning by androgens and anabolic congeners, accidental (unintentional), initial encounter: Secondary | ICD-10-CM | POA: Insufficient documentation

## 2017-03-16 DIAGNOSIS — G3184 Mild cognitive impairment, so stated: Secondary | ICD-10-CM

## 2017-03-16 DIAGNOSIS — F901 Attention-deficit hyperactivity disorder, predominantly hyperactive type: Secondary | ICD-10-CM

## 2017-03-16 DIAGNOSIS — T387X1D Poisoning by androgens and anabolic congeners, accidental (unintentional), subsequent encounter: Secondary | ICD-10-CM

## 2017-03-16 DIAGNOSIS — S0990XS Unspecified injury of head, sequela: Secondary | ICD-10-CM

## 2017-03-16 MED ORDER — LORAZEPAM 1 MG PO TABS: 1.0000 mg | ORAL_TABLET | Freq: Every day | ORAL | 3 refills | Status: DC

## 2017-03-16 MED ORDER — ADDERALL 30 MG PO TABS: 30.0000 mg | ORAL_TABLET | Freq: Two times a day (BID) | ORAL | 0 refills | Status: DC

## 2017-03-16 MED ORDER — AMITRIPTYLINE HCL 25 MG PO TABS
25.0000 mg | ORAL_TABLET | Freq: Every day | ORAL | 3 refills | Status: DC
Start: 2017-03-16 — End: 2017-09-14

## 2017-03-16 NOTE — Patient Instructions (Signed)
Amphetamine; Dextroamphetamine tablets What is this medicine? AMPHETAMINE; DEXTROAMPHETAMINE(am FET a meen; dex troe am FET a meen) is used to treat attention-deficit hyperactivity disorder (ADHD). It may also be used for narcolepsy. Federal law prohibits giving this medicine to any person other than the person for whom it was prescribed. Do not share this medicine with anyone else. This medicine may be used for other purposes; ask your health care provider or pharmacist if you have questions. COMMON BRAND NAME(S): Adderall What should I tell my health care provider before I take this medicine? They need to know if you have any of these conditions: -anxiety or panic attacks -circulation problems in fingers and toes -glaucoma -hardening or blockages of the arteries or heart blood vessels -heart disease or a heart defect -high blood pressure -history of a drug or alcohol abuse problem -history of stroke -kidney disease -liver disease -mental illness -seizures -suicidal thoughts, plans, or attempt; a previous suicide attempt by you or a family member -thyroid disease -Tourette's syndrome -an unusual or allergic reaction to dextroamphetamine, other amphetamines, other medicines, foods, dyes, or preservatives -pregnant or trying to get pregnant -breast-feeding How should I use this medicine? Take this medicine by mouth with a glass of water. Follow the directions on the prescription label. Take your doses at regular intervals. Do not take your medicine more often than directed. Do not suddenly stop your medicine. You must gradually reduce the dose or you may feel withdrawal effects. Ask your doctor or health care professional for advice. Talk to your pediatrician regarding the use of this medicine in children. Special care may be needed. While this drug may be prescribed for children as young as 3 years for selected conditions, precautions do apply. Overdosage: If you think you have taken too  much of this medicine contact a poison control center or emergency room at once. NOTE: This medicine is only for you. Do not share this medicine with others. What if I miss a dose? If you miss a dose, take it as soon as you can. If it is almost time for your next dose, take only that dose. Do not take double or extra doses. What may interact with this medicine? Do not take this medicine with any of the following medications: -MAOIS like Carbex, Eldepryl, Marplan, Nardil, and Parnate -other stimulant medicines for attention disorders, weight loss, or to stay awake This medicine may also interact with the following medications: -acetazolamide -ammonium chloride -antacids -ascorbic acid -atomoxetine -caffeine -certain medicines for blood pressure -certain medicines for depression, anxiety, or psychotic disturbances -certain medicines for seizures like carbamazepine, phenobarbital, phenytoin -certain medicines for stomach problems like cimetidine, famotidine, omeprazole, lansoprazole -cold or allergy medicines -glutamic acid -lithium -meperidine -methenamine; sodium acid phosphate -narcotic medicines for pain -norepinephrine -phenothiazines like chlorpromazine, mesoridazine, prochlorperazine, thioridazine -sodium acid phosphate -sodium bicarbonate This list may not describe all possible interactions. Give your health care provider a list of all the medicines, herbs, non-prescription drugs, or dietary supplements you use. Also tell them if you smoke, drink alcohol, or use illegal drugs. Some items may interact with your medicine. What should I watch for while using this medicine? Visit your doctor or health care professional for regular checks on your progress. This prescription requires that you follow special procedures with your doctor and pharmacy. You will need to have a new written prescription from your doctor every time you need a refill. This medicine may affect your  concentration, or hide signs of tiredness. Until you know how this   medicine affects you, do not drive, ride a bicycle, use machinery, or do anything that needs mental alertness. Tell your doctor or health care professional if this medicine loses its effects, or if you feel you need to take more than the prescribed amount. Do not change the dosage without talking to your doctor or health care professional. Decreased appetite is a common side effect when starting this medicine. Eating small, frequent meals or snacks can help. Talk to your doctor if you continue to have poor eating habits. Height and weight growth of a child taking this medicine will be monitored closely. Do not take this medicine close to bedtime. It may prevent you from sleeping. If you are going to need surgery, a MRI, CT scan, or other procedure, tell your doctor that you are taking this medicine. You may need to stop taking this medicine before the procedure. Tell your doctor or healthcare professional right away if you notice unexplained wounds on your fingers and toes while taking this medicine. You should also tell your healthcare provider if you experience numbness or pain, changes in the skin color, or sensitivity to temperature in your fingers or toes. What side effects may I notice from receiving this medicine? Side effects that you should report to your doctor or health care professional as soon as possible: -allergic reactions like skin rash, itching or hives, swelling of the face, lips, or tongue -changes in vision -chest pain or chest tightness -confusion, trouble speaking or understanding -fast, irregular heartbeat -fingers or toes feel numb, cool, painful -hallucination, loss of contact with reality -high blood pressure -males: prolonged or painful erection -seizures -severe headaches -shortness of breath -suicidal thoughts or other mood changes -trouble walking, dizziness, loss of balance or  coordination -uncontrollable head, mouth, neck, arm, or leg movements Side effects that usually do not require medical attention (report to your doctor or health care professional if they continue or are bothersome): -anxious -headache -loss of appetite -nausea, vomiting -trouble sleeping -weight loss This list may not describe all possible side effects. Call your doctor for medical advice about side effects. You may report side effects to FDA at 1-800-FDA-1088. Where should I keep my medicine? Keep out of the reach of children. This medicine can be abused. Keep your medicine in a safe place to protect it from theft. Do not share this medicine with anyone. Selling or giving away this medicine is dangerous and against the law. Store at room temperature between 15 and 30 degrees C (59 and 86 degrees F). Keep container tightly closed. Throw away any unused medicine after the expiration date. Dispose of properly. This medicine may cause accidental overdose and death if it is taken by other adults, children, or pets. Mix any unused medicine with a substance like cat litter or coffee grounds. Then throw the medicine away in a sealed container like a sealed bag or a coffee can with a lid. Do not use the medicine after the expiration date. NOTE: This sheet is a summary. It may not cover all possible information. If you have questions about this medicine, talk to your doctor, pharmacist, or health care provider.  2018 Elsevier/Gold Standard (2014-02-22 18:44:41)  
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
## 2017-03-17 ENCOUNTER — Telehealth: Payer: Self-pay | Admitting: Family Medicine

## 2017-03-17 MED ORDER — AZITHROMYCIN 250 MG PO TABS: ORAL_TABLET | ORAL | 0 refills | Status: DC

## 2017-03-17 NOTE — Telephone Encounter (Signed)
Called in 2 days worth of script for him so he can have complete course. Also remind pt even though this medication is completed in 5 days, it continues to work in his system for much longer.

## 2017-03-17 NOTE — Telephone Encounter (Signed)
Patient states he had 2 doses of Z pac left and his dog chewed them.  He states he still doesn't feel 100% and is not sure what he should.  Please advise.

## 2017-03-17 NOTE — Telephone Encounter (Signed)
Notified patient reviewed information.

## 2017-03-31 ENCOUNTER — Telehealth: Payer: Self-pay | Admitting: Acute Care

## 2017-04-01 ENCOUNTER — Other Ambulatory Visit: Payer: Self-pay | Admitting: Acute Care

## 2017-04-01 DIAGNOSIS — Z122 Encounter for screening for malignant neoplasm of respiratory organs: Secondary | ICD-10-CM

## 2017-04-01 DIAGNOSIS — Z87891 Personal history of nicotine dependence: Secondary | ICD-10-CM

## 2017-04-01 NOTE — Telephone Encounter (Signed)
Rodena Piety please advise if CT has been scheduled and if encounter can be closed. Thanks.

## 2017-04-01 NOTE — Telephone Encounter (Signed)
Will forward to Rodena Piety to have her call pt to schedule CT.

## 2017-04-02 NOTE — Telephone Encounter (Signed)
I called the patient yesterday 11/28 and had to LVM for him to call me back and at this point in time I have not heard back from him

## 2017-04-03 NOTE — Telephone Encounter (Signed)
ATC pt, no answer. Left message for pt to call back.  

## 2017-04-06 ENCOUNTER — Ambulatory Visit: Payer: BLUE CROSS/BLUE SHIELD | Admitting: Family Medicine

## 2017-04-06 ENCOUNTER — Encounter: Payer: Self-pay | Admitting: Family Medicine

## 2017-04-06 VITALS — BP 142/79 | HR 76 | Temp 97.6°F | Wt 186.0 lb

## 2017-04-06 DIAGNOSIS — J029 Acute pharyngitis, unspecified: Secondary | ICD-10-CM | POA: Diagnosis not present

## 2017-04-06 DIAGNOSIS — J0100 Acute maxillary sinusitis, unspecified: Secondary | ICD-10-CM | POA: Diagnosis not present

## 2017-04-06 LAB — POCT RAPID STREP A (OFFICE): Rapid Strep A Screen: NEGATIVE

## 2017-04-06 NOTE — Patient Instructions (Addendum)
Rest, hydrate. HYDRATE.  + flonase, mucinex (DM if cough), nettie pot or nasal saline.  Start allegra daily (antihistamine).  I will call you in 2 days with throat culture results if positive will treat, if negative but you are still having symptoms with the above regimen, then we will treat your sinus infection.   F/U 2 weeks of not improved.      Sinusitis, Adult Sinusitis is soreness and inflammation of your sinuses. Sinuses are hollow spaces in the bones around your face. They are located:  Around your eyes.  In the middle of your forehead.  Behind your nose.  In your cheekbones.  Your sinuses and nasal passages are lined with a stringy fluid (mucus). Mucus normally drains out of your sinuses. When your nasal tissues get inflamed or swollen, the mucus can get trapped or blocked so air cannot flow through your sinuses. This lets bacteria, viruses, and funguses grow, and that leads to infection. Follow these instructions at home: Medicines  Take, use, or apply over-the-counter and prescription medicines only as told by your doctor. These may include nasal sprays.  If you were prescribed an antibiotic medicine, take it as told by your doctor. Do not stop taking the antibiotic even if you start to feel better. Hydrate and Humidify  Drink enough water to keep your pee (urine) clear or pale yellow.  Use a cool mist humidifier to keep the humidity level in your home above 50%.  Breathe in steam for 10-15 minutes, 3-4 times a day or as told by your doctor. You can do this in the bathroom while a hot shower is running.  Try not to spend time in cool or dry air. Rest  Rest as much as possible.  Sleep with your head raised (elevated).  Make sure to get enough sleep each night. General instructions  Put a warm, moist washcloth on your face 3-4 times a day or as told by your doctor. This will help with discomfort.  Wash your hands often with soap and water. If there is no soap  and water, use hand sanitizer.  Do not smoke. Avoid being around people who are smoking (secondhand smoke).  Keep all follow-up visits as told by your doctor. This is important. Contact a doctor if:  You have a fever.  Your symptoms get worse.  Your symptoms do not get better within 10 days. Get help right away if:  You have a very bad headache.  You cannot stop throwing up (vomiting).  You have pain or swelling around your face or eyes.  You have trouble seeing.  You feel confused.  Your neck is stiff.  You have trouble breathing. This information is not intended to replace advice given to you by your health care provider. Make sure you discuss any questions you have with your health care provider. Document Released: 10/08/2007 Document Revised: 12/16/2015 Document Reviewed: 02/14/2015 Elsevier Interactive Patient Education  [employer].

## 2017-04-06 NOTE — Progress Notes (Signed)
Phillip [name] , [birth year], 61 y.o., male MRN: [id number] Patient Care Team    Relationship Specialty Notifications Start End  [name], DO PCP - General Family Medicine  11/12/16   [name], MD Consulting Physician Rheumatology  04/11/16   [name], MD Consulting Physician Internal Medicine  11/13/16    Comment: Testosterone  [name], MD Consulting Physician Urology  11/13/16   [name], NP Registered Nurse Neurology  11/13/16    Comment: Dr. Rush Landmark, [name], MD Consulting Physician Cardiology  11/13/16   [name], MD Consulting Physician Gastroenterology  12/10/16     Chief Complaint  Patient presents with  . URI    sore throat,nausea,diarrhea      Subjective:  Patient returns today after being treated for bronchitis with Z-Pak approximately 3 weeks ago with continued symptoms. He reports sore throat, nausea and diarrhea. She did use the Flonase nasal spray but only for a couple days. He did finish the Z-Pak and had some improvement, however his symptoms returned. He did not start an antihistamine. He did not start Mucinex. He was using eyedrops which were helpful. He denies fever, chills. He feels out of sorts, and has noticed his chronic neurological symptoms increasing. Prior note: Pt presents for an OV with complaints of chest burning cough, that is productive with yellow sputum, nausea, nasal congestion and sore throat. He admits to mild nausea as well. He denies fever, chills, diarrhea or rash. He has tried nothing over-the-counter. He is concerned because this has been present for 10 days and he is prone to pneumonia.  Depression screen [facility] 2/9 12/10/2016 11/12/2016 08/29/2015 06/20/2015 01/03/2015  Decreased Interest 0 0 0 0 0  Down, Depressed, Hopeless 0 0 1 0 0  PHQ - 2 Score 0 0 1 0 0  Altered sleeping - - - - -  Tired, decreased energy - - - - -  Change in appetite - - - - -  Feeling bad or failure about yourself  - - - - -    Trouble concentrating - - - - -  Moving slowly or fidgety/restless - - - - -  Suicidal thoughts - - - - -  PHQ-9 Score - - - - -    Allergies  Allergen Reactions  . Amphetamines     Intolerant to specific formulations: [employer] amphetamine TEVA dextroamp amphetamine  . Lorazepam     Mylan lorazepam; others ok  . Methylprednisolone   . Penicillins Hives  . Propofol     Cognitive delay and emotional   Social History   Tobacco Use  . Smoking status: Former Smoker    Packs/day: 1.50    Years: 25.00    Pack years: 37.50    Types: Cigarettes    Last attempt to quit: 11/22/2014    Years since quitting: 2.3  . Smokeless tobacco: Never Used  . Tobacco comment: Encouraged to remain smoke free  Substance Use Topics  . Alcohol use: No    Alcohol/week: 0.0 oz    Comment: 8 drinks   Past Medical History:  Diagnosis Date  . ADD (attention deficit disorder)    on adderrall managed by neurology  . Anxiety    on ativan managed by Neurology  . 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 Cellulitis   . Chronic traumatic encephalopathy    right frontal-temproal lobe  .  Depression   . Erectile dysfunction 11/2016   follows with urology; sildenafil prescribed  . Meningoencephalitis 1994  . Pneumonia   . Primary hypogonadism in male   . Raynaud's disease 09/13/2015  . Restless legs 09/13/2015  . Rocky Mountain spotted fever 2017  . TBI (traumatic brain injury) ([place]) 1997, 2009   "concussions"   Past Surgical History:  Procedure Laterality Date  . TEE WITHOUT CARDIOVERSION N/A 09/06/2015   Procedure: TRANSESOPHAGEAL ECHOCARDIOGRAM (TEE);  Surgeon: Jerline Pain, MD;  R/O endocarditis;  . TONSILLECTOMY  1965   Family History  Problem Relation Age of Onset  . Hyperlipidemia Father   . Heart disease Maternal Grandfather   . Cancer Paternal Grandfather   . Mental retardation Mother   . Mental  illness Brother    Allergies as of 04/06/2017      Reactions   Amphetamines    Intolerant to specific formulations: [employer] amphetamine TEVA dextroamp amphetamine   Lorazepam    Mylan lorazepam; others ok   Methylprednisolone    Penicillins Hives   Propofol    Cognitive delay and emotional      Medication List        Accurate as of 04/06/17  1:54 PM. Always use your most recent med list.          ADDERALL 30 MG tablet Generic drug:  amphetamine-dextroamphetamine Take 1 tablet 2 (two) times daily by mouth.   amitriptyline 25 MG tablet Commonly known as:  ELAVIL Take 1 tablet (25 mg total) at bedtime by mouth.   amLODipine 2.5 MG tablet Commonly known as:  NORVASC Take 1 tablet (2.5 mg total) by mouth daily.   LORazepam 1 MG tablet Commonly known as:  ATIVAN Take 1 tablet (1 mg total) at bedtime by mouth.   TESTOSTERONE IM Inject 3 mLs once a week into the muscle.       All past medical history, surgical history, allergies, family history, immunizations andmedications were updated in the EMR today and reviewed under the history and medication portions of their EMR.     ROS: Negative, with the exception of above mentioned in HPI   Objective:  BP (!) 142/79 (BP Location: Right Arm, Patient Position: Sitting, Cuff Size: Normal)   Pulse 76   Temp 97.6 F (36.4 C)   Wt 186 lb (84.4 kg)   SpO2 98%   BMI 24.54 kg/m  Body mass index is 24.54 kg/m.  Gen: Afebrile. No acute distress. Nontoxic in appearance, well developed, well nourished. Pleasant caucasian male.  HENT: AT. [place]. Bilateral TM visualized and normal in appearance. MMM. Bilateral nares with erythema and drainage right greater than left. Throat without erythema or exudates. Mild postnasal drip. No cough, no hoarseness. No maxillary sinus pressure. Eyes:Pupils Equal Round Reactive to light, Extraocular movements intact,  Conjunctiva without redness, discharge or icterus. Neck/lymp/endocrine: Supple, no  lymphadenopathy CV: RRR  Chest: CTAB, no wheeze or crackles Abd: Soft. NTND. BS present. no Masses palpated.  Skin: No rashes, purpura or petechiae.  Neuro: Normal gait. PERLA. EOMi. Alert. Oriented x3  No exam data present No results found. No results found for this or any previous visit (from the past 24 hour(s)).  Assessment/Plan: Phillip [name] is a 61 y.o. male present for OV for  Sore throat - POCT rapid strep A>> negative - Upper Respiratory Culture>> await culture to treat Acute maxillary sinusitis, recurrence not specified Patients with signs and symptoms of mild maxillary sinusitis. Could consider treatment with doxycycline. Would  like to wait until throat culture is available. Meantime patient is to use Flonase, Mucinex and maintain hydration. - If throat culture positive we'll treat appropriately - If throat culture negative but patient still having signs and symptoms of sinusitis after starting above regimen, and we treated with doxycycline. - Hopefully he gets symptomatic relief and no additional antibiotics are needed.   Reviewed expectations re: course of current medical issues.  Discussed self-management of symptoms.  Outlined signs and symptoms indicating need for more acute intervention.  Patient verbalized understanding and all questions were answered.  Patient received an After-Visit Summary.    Orders Placed This Encounter  Procedures  . POCT rapid strep A     Note is dictated utilizing voice recognition software. Although note has been proof read prior to signing, occasional typographical errors still can be missed. If any questions arise, please do not hesitate to call for verification.   electronically signed by:  Howard Pouch, DO  [place]

## 2017-04-06 NOTE — Telephone Encounter (Signed)
The patient has been scheduled for CT at [place] location on 04/07/2017 @ 9:00am arrival time 8:40am LVM about this appt and location

## 2017-04-07 ENCOUNTER — Ambulatory Visit: Payer: BLUE CROSS/BLUE SHIELD

## 2017-04-08 ENCOUNTER — Other Ambulatory Visit: Payer: Self-pay | Admitting: Adult Health

## 2017-04-08 DIAGNOSIS — R5383 Other fatigue: Secondary | ICD-10-CM

## 2017-04-08 DIAGNOSIS — S0990XS Unspecified injury of head, sequela: Secondary | ICD-10-CM

## 2017-04-08 MED ORDER — ADDERALL 30 MG PO TABS: 30.0000 mg | ORAL_TABLET | Freq: Two times a day (BID) | ORAL | 0 refills | Status: DC

## 2017-04-08 NOTE — Telephone Encounter (Signed)
Patient/'s friend [name] requesting refill of ADDERALL 30 MG tablet.

## 2017-04-08 NOTE — Addendum Note (Signed)
Addended by: Brandon Melnick on: 04/08/2017 03:37 PM   Modules accepted: Orders

## 2017-04-09 ENCOUNTER — Other Ambulatory Visit: Payer: Self-pay | Admitting: *Deleted

## 2017-04-09 LAB — CULTURE, UPPER RESPIRATORY
MICRO NUMBER:: 81358873
SPECIMEN QUALITY:: ADEQUATE

## 2017-04-09 NOTE — Telephone Encounter (Signed)
This up front for pick up.

## 2017-04-09 NOTE — Telephone Encounter (Signed)
Old prescription 03/16/17 of adderall in file as well for pick up, I took and shredded.

## 2017-04-09 NOTE — Telephone Encounter (Signed)
Adderall refill Rx at front desk for pick up.

## 2017-04-15 ENCOUNTER — Encounter: Payer: Self-pay | Admitting: *Deleted

## 2017-04-16 ENCOUNTER — Ambulatory Visit
Admission: RE | Admit: 2017-04-16 | Discharge: 2017-04-16 | Disposition: A | Discharge status: Home / Self Care | Payer: BLUE CROSS/BLUE SHIELD | Source: Ambulatory Visit | Attending: Acute Care | Admitting: Acute Care

## 2017-04-16 DIAGNOSIS — Z87891 Personal history of nicotine dependence: Secondary | ICD-10-CM

## 2017-04-16 DIAGNOSIS — Z122 Encounter for screening for malignant neoplasm of respiratory organs: Secondary | ICD-10-CM

## 2017-04-23 ENCOUNTER — Telehealth: Payer: Self-pay | Admitting: Acute Care

## 2017-04-23 DIAGNOSIS — Z87891 Personal history of nicotine dependence: Secondary | ICD-10-CM

## 2017-04-23 DIAGNOSIS — Z122 Encounter for screening for malignant neoplasm of respiratory organs: Secondary | ICD-10-CM

## 2017-04-23 NOTE — Telephone Encounter (Signed)
Pt informed of CT results per Sarah Groce, NP.  PT verbalized understanding.  Copy sent to PCP.  Order placed for 1 yr f/u CT.  

## 2017-05-07 ENCOUNTER — Telehealth: Payer: Self-pay | Admitting: Neurology

## 2017-05-07 ENCOUNTER — Other Ambulatory Visit: Payer: Self-pay | Admitting: Neurology

## 2017-05-07 DIAGNOSIS — R5383 Other fatigue: Secondary | ICD-10-CM

## 2017-05-07 DIAGNOSIS — S0990XS Unspecified injury of head, sequela: Secondary | ICD-10-CM

## 2017-05-07 MED ORDER — ADDERALL 30 MG PO TABS: 30.0000 mg | ORAL_TABLET | Freq: Two times a day (BID) | ORAL | 0 refills | Status: DC

## 2017-05-07 NOTE — Telephone Encounter (Signed)
I have sent this request to the work in doc and will have it at the front desk for pick up.

## 2017-05-07 NOTE — Telephone Encounter (Signed)
Pt's wife request refill for ADDERALL 30 MG tablet. If possible she would like to pick it up tomorrow. She is aware the clinic closes at noon tomorrow. Please call her if RX will not be ready for pick up

## 2017-05-08 NOTE — Telephone Encounter (Signed)
If pt or [name] calls back who is on dpr. The rx is at the front desk. Ready for pick up, vm was left.

## 2017-05-08 NOTE — Telephone Encounter (Addendum)
Pt's girlfriend called today, she is wanting to pick it up today. She is working on Monday and will not be able to pick it up, he will be out on Tuesday. Today is the best day for her to pick it up. Please call to advise her at [phone]

## 2017-05-08 NOTE — Telephone Encounter (Signed)
Left vm for patients girlfriend Phillip [name] that rx is sign,and is at the front desk for pick up.The rx is adderall. Rn left on vm that office close at 1200pm today.

## 2017-05-15 ENCOUNTER — Other Ambulatory Visit: Payer: Self-pay | Admitting: *Deleted

## 2017-05-15 MED ORDER — AMLODIPINE BESYLATE 2.5 MG PO TABS: 2.5000 mg | ORAL_TABLET | Freq: Every day | ORAL | 0 refills | Status: DC

## 2017-05-25 ENCOUNTER — Telehealth: Payer: Self-pay | Admitting: Family Medicine

## 2017-05-25 NOTE — Telephone Encounter (Signed)
Please call pt: -He has been seeing Dr. Renne Crigler (endocrine) for this condition. Last seen 12/2016 and she has been monitoring and providing testosterone. My confusion is she is not in [place] as pt stated.   I do recommend he remain with endocrine for testosterone, given his complicated history surrounding the development of his condition.

## 2017-05-25 NOTE — Telephone Encounter (Signed)
Please advise patient if Dr Raoul Pitch agrees or disagrees.

## 2017-05-25 NOTE — Telephone Encounter (Signed)
Copied from Jessie [phone]. Topic: Quick Communication - See Telephone Encounter >> May 25, 2017 11:53 AM Ether Griffins B wrote: CRM for notification. See Telephone encounter for:  Pt requesting Dr. Raoul Pitch take over his Testosterone cypionate injections 2000mg  / 59mL. Originally got them from a doctor in [place] and pt is no longer seeing him as frequently as he was. He was just wanting the prescription to be with his primary. The doctor in [place] was treating him for rocky mountain spotted fever and lyme and he is no longer needing to see him. They are just hoping to move it closer to home. If [name] doesn't answer please try [facility]'s number.  05/25/17.

## 2017-05-25 NOTE — Telephone Encounter (Signed)
Spoke with patient wife reviewed information. Patient's wife verbalized understanding. They will follow up with Dr Cruzita Lederer.

## 2017-05-26 ENCOUNTER — Telehealth: Payer: Self-pay | Admitting: Internal Medicine

## 2017-05-26 NOTE — Telephone Encounter (Signed)
Patient need a prescription for Testosterone cypionate 2000 mg/ml Send to [id number] - [place], [place] - [address]

## 2017-05-27 NOTE — Telephone Encounter (Signed)
OK to refill. Dose per my last instructions:  " let's try to reduce the dose of your testosterone injection to 0.2 alternating with 0.3 mg every other week"  Ty, C

## 2017-05-27 NOTE — Telephone Encounter (Signed)
Contacted pharmacy and gave RX verbally.  Pt's friend, [name] aware.  Okay per DPR.

## 2017-05-27 NOTE — Telephone Encounter (Signed)
Please advise refill? 

## 2017-06-01 ENCOUNTER — Telehealth: Payer: Self-pay | Admitting: Internal Medicine

## 2017-06-01 NOTE — Telephone Encounter (Signed)
Had to call pharmacy to clarify dosage and the pharmacy will call pt when ready

## 2017-06-01 NOTE — Telephone Encounter (Signed)
Patient wife stated that pharmacy haven't received patient medication for Testosterone   Please advise

## 2017-06-05 DIAGNOSIS — Z9861 Coronary angioplasty status: Secondary | ICD-10-CM

## 2017-06-05 DIAGNOSIS — I2510 Atherosclerotic heart disease of native coronary artery without angina pectoris: Secondary | ICD-10-CM

## 2017-06-05 HISTORY — DX: Atherosclerotic heart disease of native coronary artery without angina pectoris: I25.10

## 2017-06-05 HISTORY — DX: Coronary angioplasty status: Z98.61

## 2017-06-09 ENCOUNTER — Telehealth: Payer: Self-pay | Admitting: Neurology

## 2017-06-09 ENCOUNTER — Other Ambulatory Visit: Payer: Self-pay | Admitting: Neurology

## 2017-06-09 DIAGNOSIS — R5383 Other fatigue: Secondary | ICD-10-CM

## 2017-06-09 DIAGNOSIS — S0990XS Unspecified injury of head, sequela: Secondary | ICD-10-CM

## 2017-06-09 MED ORDER — ADDERALL 30 MG PO TABS: 30.0000 mg | ORAL_TABLET | Freq: Two times a day (BID) | ORAL | 0 refills | Status: DC

## 2017-06-09 NOTE — Telephone Encounter (Signed)
The script will be ready for pick up after lunch today

## 2017-06-09 NOTE — Telephone Encounter (Signed)
Pt's friend [name]) request refill for ADDERALL 30 MG tablet .

## 2017-06-11 ENCOUNTER — Ambulatory Visit: Payer: BLUE CROSS/BLUE SHIELD | Admitting: Internal Medicine

## 2017-06-11 ENCOUNTER — Encounter: Payer: Self-pay | Admitting: Internal Medicine

## 2017-06-11 VITALS — BP 130/70 | HR 86 | Temp 97.4°F | Ht 73.0 in | Wt 190.4 lb

## 2017-06-11 DIAGNOSIS — E230 Hypopituitarism: Secondary | ICD-10-CM | POA: Diagnosis not present

## 2017-06-11 DIAGNOSIS — N529 Male erectile dysfunction, unspecified: Secondary | ICD-10-CM

## 2017-06-11 NOTE — Progress Notes (Signed)
Patient ID: Phillip [name], male   DOB: [birth year], 62 y.o.   MRN: [id number]   HPI: Phillip [name] is a 62 y.o.-year-old man, initially referred by his neurologist, Dr. [name], now returning for follow-up for hypogonadism. Last visit 6 months ago.  We are trying to slowly reduce his testosterone supplementation (he is interested in coming off testosterone) as his pituitary function improved in the last year (please see history below).  She did not feel good after we try to reduce the testosterone to 0.2 mLweekly in 03/2016.  His testosterone was also low at that time, so we increased the dose back to 0.3 mL weekly 09/2016.  At last visit, in 12/2016, his testosterone level was normal, and he was feeling well, with fair libido, so I advised him again to try to decrease the dose, 0.2 alternating with 0.3 mL every other week.  He and his wife are bringing the syringe that they use today's visit, and they use a 3 mL one.  Therefore, it is difficult to dose testosterone at these low volumes.  Per their demonstration, he is usually alternating 0.2 with 0.4 mL every other week.  Of note, his PSA and CBC were normal at last visit, in 12/2016.  He continues to have occasional hot flashes, not bothersome.  No breast tenderness.  He has ED, but libido is fair.  He is seeing Dr. Diona Fanti with urology.  He was started on Viagra.  DRE was normal.  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 He had a subsequent visit with his PCP in 09/2015 >> a testosterone level was found to be extremely low.   Received records from urology >> reviewed: Patient's initial free testosterone level obtained by PCP was 0.4 pg/mL (09/17/2015). At that time, he complained of low libido, testicular atrophy, difficulty obtaining and maintaining an erection. A repeat testosterone level obtained by an integrative medicine provider in [place] (09/25/2015) showed again very low testosterone level: Total Testosterone <3 ng/dL, and the free testosterone of 0.2 ng/dL. DHEAS was 122.4 (48.9-344.2). Of note, no LH and FSH levels were drawn at that time. At that point, he was started on testosterone cypionate 8 IM 0.8 mL every other week. This has not improved his libido significantly, but it helped him obtaining morning erections.  A genital exam was performed on 11/09/2015 by his urologist: Atrophic left and right testes. No tenderness, swelling, masses, varicocele, or hydrocele. No abnormality of the penis. Circumcised status. Prostate: 2+ in size, 40 g. Normal consistency  Dr. Diona Fanti obtain the following labs - 11/09/2015 - collection time: 3:34 PM  Total testosterone 1125.7 (300-890) Free testosterone 175.3 (47-244) SHBG 66.3 (10-57)  FSH 0.1, LH 0.1 Prolactin 6.3 Of note, at the time of the above collection, patient was taking 0.8 mL testosterone every other week in his deltoid. The dose was changed at that time to 0.5 mL every week in his quadriceps, after which his fatigue improved a little, and he had more energy.   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 He saw Dr. [name], who obtained a  brain  + pituitary MRI (10/24/2015). 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 There was no abnormality in the pituitary gland.  Further reviewed history: He admits for previous decreased libido >>  normal now Had difficulty obtaining or maintaining an erection >> not anymore No trauma to testes, testicular irradiation or surgery No h/o of mumps orchitis/h/o autoimmune ds. No h/o cryptorchidism He grew and went through puberty like his peers + shrinking of testes. No very small testes (<5 ml) No incomplete/delayed sexual development     No  breast discomfort/gynecomastia    No loss of body hair (axillary/pubic)/decreased need for shaving No height loss No abnormal sense of smell  + hot flushes, resolved No vision problems, other than blurry vision in R eye, also photopsia - started 11/2015 No worst HA of his life He does have a history of head trauma in 1997, when a tree fell on top of his head and he lost consciousness for more than 30 minutes; in 2009 he fell off ladder and landed on back of his head No FH of hypogonadism/infertility No personal h/o infertility - has 2 children: 52 and 56 years old  No FH of hemochromatosis or pituitary tumors No excessive weight gain or loss.  No chronic diseases, but recently diagnosed with RMSF Spring 2017 No chronic pain. Not on opiates, does not take steroids, had a course this spring - ~1 week He was drinking Alcohol: ~2 drinks a day up to 2015 >>  stopped completely since then No anabolic steroids use No herbal medicines. Not on antidepressants  No AI ds in his family, no FH of MS. He does not have family history of early cardiac disease.  Reviewed pertinent labs: Component     Latest Ref Rng & Units 03/31/2016 09/26/2016 12/19/2016  Testosterone     264 - 916 ng/dL 762 243 (L) 974 (H)  Testosterone Free     7.2 - 24.0 pg/mL 8.5 2.8 (L) 11.6  Sex Horm Binding Glob, Serum     19.3 - 76.4 nmol/L 85.3 (H) 102.4 (H) 93.5 (H)   Previously: Component     Latest Ref Rng & Units 01/28/2016  Testosterone     264 - 916 ng/dL 1,103 (H)  Testosterone Free     7.2 - 24.0 pg/mL 15.1  Sex Horm Binding Glob, Serum     19.3 - 76.4 nmol/L 80.8 (H)  TSH     0.35 - 4.50 uIU/mL 1.90  Triiodothyronine,Free,Serum     2.3 - 4.2 pg/mL 3.9  T4,Free(Direct)     0.60 - 1.60 ng/dL 0.80  FSH     1.4 - 18.1 mIU/ML 0.1 (L)  Cortisol, Plasma     ug/dL 13.1  LH     1.50 - 9.30 mIU/mL 0.03 (L)  hCG Quant     0 - 3 mIU/mL <1  Total testosterone level is elevated, however free testosterone is  normal. SHBG is high, which is desirable. Cortisol level is normal, no elevated beta hCG. Normal thyroid tests.  Component     Latest Ref Rng & Units 01/28/2016  IGF-I, LC/MS     50 - 317 ng/mL 140  Z-Score (Male)     -2.0 - 2.0 SD 0.1  Estradiol, Free     ADULTS: < OR = 0.45 pg/mL 1.06 (H)  Estradiol     ADULTS: < OR = 29 pg/mL 53 (H)  Prolactin     2.0 - 18.0 ng/mL 8.1   IGF-I is normal. Estrogen level is high, Likely secondary to testosterone therapy. Prolactin level is normal.  ROS: Constitutional: + Intended weight gain, +, also please see HPI Eyes: no blurry vision, no xerophthalmia ENT: + sore throat (  allergies), no nodules palpated in throat, no dysphagia, no odynophagia, no hoarseness Cardiovascular: no CP/no SOB/no palpitations/no leg swelling Respiratory: no cough/no SOB/no wheezing Gastrointestinal: no N/no V/no D/no C/no acid reflux Musculoskeletal: no muscle aches/no joint aches Skin: no rashes, no hair loss Neurological: no tremors/no numbness/no tingling/no dizziness  I reviewed pt's medications, allergies, PMH, social hx, family hx, and changes were documented in the history of present illness. Otherwise, unchanged from my initial visit note.  Past Medical History:  Diagnosis Date  . ADD (attention deficit disorder)    on adderrall managed by neurology  . Anxiety    on ativan managed by Neurology  . 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 Cellulitis   . Chronic traumatic encephalopathy    right frontal-temproal lobe  . Depression   . Erectile dysfunction 11/2016   follows with urology; sildenafil prescribed  . Meningoencephalitis 1994  . Pneumonia   . Primary hypogonadism in male   . Raynaud's disease 09/13/2015  . Restless legs 09/13/2015  . Rocky Mountain spotted fever 2017  . TBI (traumatic brain injury) ([place]) 1997, 2009   "concussions"   Past Surgical History:   Procedure Laterality Date  . TEE WITHOUT CARDIOVERSION N/A 09/06/2015   Procedure: TRANSESOPHAGEAL ECHOCARDIOGRAM (TEE);  Surgeon: Jerline Pain, MD;  R/O endocarditis;  . TONSILLECTOMY  1965   Social History   Social History  . Marital status: Single    Spouse name: N/A  . Number of children: 2   Occupational History  . n/a   Social History Main Topics  . Smoking status: Former Smoker    Packs/day: 1.50    Years: 25.00    Types: Cigarettes    Quit date: 2002  . Smokeless tobacco: Never Used     Comment: Encouraged to remain smoke free  . Alcohol use No     Comment: 8 drinks  . Drug use: No   Social History Narrative   Divorced. Education: [employer].   Current Outpatient Medications on File Prior to Visit  Medication Sig Dispense Refill  . ADDERALL 30 MG tablet Take 1 tablet by mouth 2 (two) times daily. 60 tablet 0  . amitriptyline (ELAVIL) 25 MG tablet Take 1 tablet (25 mg total) at bedtime by mouth. (Patient not taking: Reported on 04/06/2017) 30 tablet 3  . amLODipine (NORVASC) 2.5 MG tablet Take 1 tablet (2.5 mg total) by mouth daily. Needs office visit prior to anymore refills 90 tablet 0  . LORazepam (ATIVAN) 1 MG tablet Take 1 tablet (1 mg total) at bedtime by mouth. 90 tablet 3  . TESTOSTERONE IM Inject 3 mLs once a week into the muscle.      No current facility-administered medications on file prior to visit.    Allergies  Allergen Reactions  . Amphetamines     Intolerant to specific formulations: [employer] amphetamine TEVA dextroamp amphetamine  . Lorazepam     Mylan lorazepam; others ok  . Methylprednisolone   . Penicillins Hives  . Propofol     Cognitive delay and emotional   Family History  Problem Relation Age of Onset  . Hyperlipidemia Father   . Heart disease Maternal Grandfather   . Cancer Paternal Grandfather   . Mental retardation Mother   . Mental illness Brother    PE: BP 130/70 (BP Location: Left Arm, Patient Position: Sitting, Cuff  Size: Normal)   Pulse 86  Temp (!) 97.4 F (36.3 C) (Oral)   Ht 6\' 1"  (1.854 m)   Wt 190 lb 6.4 oz (86.4 kg)   SpO2 97%   BMI 25.12 kg/m  Wt Readings from Last 3 Encounters:  06/11/17 190 lb 6.4 oz (86.4 kg)  04/06/17 186 lb (84.4 kg)  03/16/17 186 lb (84.4 kg)   Constitutional: Normal weight, in NAD Eyes: PERRLA, EOMI, no exophthalmos ENT: moist mucous membranes, no thyromegaly, no cervical lymphadenopathy Cardiovascular: RRR, No MRG Respiratory: CTA B Gastrointestinal: abdomen soft, NT, ND, BS+ Musculoskeletal: no deformities, strength intact in all 4 Skin: moist, warm, no rashes Neurological: no tremor with outstretched hands, DTR normal in all 4  ASSESSMENT: 1. 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 Labs were drawn soon after diagnosis of RMSF, which, for him, was a fulminant, systemic, disease. Usually, the pituitary gland will shunt the production of testosterone or other hormones deemed "unnecessary" during acute illness. Testosterone levels could be quite low in this situation. 2. Labs were drawn also after a prednisone course, but I do not have a clear time sequence 3. He has a history of head trauma x2, which could have caused pituitary insufficiency 4. He has a history of alcohol use, however, he relates that he stopped in 2015  2. ED  PLAN:  1. Hypogonadism - History is obtained from patient and also from his wife, who clarifies information about his symptoms, testosterone administration and past history. 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  He was started on testosterone afterwards and referred to me for further investigation.  We checked his pituitary status and his pituitary hormones along with his MRI were normal.  His LH and FSH were low, as expected on  testosterone treatment. - As his RMSF episode resolved, we discussed about trying to taper his testosterone down to off. We have gradually decreased the doses, now alternating 0.2 with 0.4 mL every other week.  At this visit, we discussed about obtaining a 1 mL syringe for more accurate dosing and injecting 0.2 alternating with 0.3 mL every other week.  He will then come back for another testosterone level, fasting, at 8 AM, 1.5 months after he changes the dose.  These doses are very low, so if he tolerates this dose is well, I think in the near future he can come off the testosterone. - Another option is to try Clomid, but I think for now we can continue with reducing his testosterone dosing.  His estrogen levels were slightly high, and we will need to recheck them at next visit - Reviewed recent PSA and CBC with patient and wife and these were normal at 6 months ago.  Will not repeat them now. - will see him back in 6 months  2.ED - Continues PDE 5 inhibitor for Dr. Diona Fanti - He tolerates this well  [name], MD PhD [facility] Endocr[facility]

## 2017-06-11 NOTE — Patient Instructions (Addendum)
Please get the 1 ml syringes.  Try to alternate 0.2 with 0.3 mL testosterone every other week.  Come back in 6 weeks fasting in am for labs in 1.5 months.  Please come back for a follow-up appointment in 6 months

## 2017-06-15 ENCOUNTER — Encounter: Payer: Self-pay | Admitting: Internal Medicine

## 2017-07-08 ENCOUNTER — Other Ambulatory Visit: Payer: Self-pay | Admitting: Neurology

## 2017-07-08 ENCOUNTER — Telehealth: Payer: Self-pay | Admitting: Neurology

## 2017-07-08 DIAGNOSIS — S0990XS Unspecified injury of head, sequela: Secondary | ICD-10-CM

## 2017-07-08 DIAGNOSIS — R5383 Other fatigue: Secondary | ICD-10-CM

## 2017-07-08 MED ORDER — ADDERALL 30 MG PO TABS: 30.0000 mg | ORAL_TABLET | Freq: Two times a day (BID) | ORAL | 0 refills | Status: DC

## 2017-07-08 NOTE — Telephone Encounter (Signed)
Script will be ready for pick up this afternoon.

## 2017-07-08 NOTE — Telephone Encounter (Signed)
Pt's friend request refill for ADDERALL 30 MG tablet

## 2017-07-09 ENCOUNTER — Ambulatory Visit: Payer: Self-pay

## 2017-07-09 NOTE — Telephone Encounter (Signed)
Patient called with c/o "purple area on lip. It came up overnight. I had some dental work abut 9 days ago and I don't know if it's related or not, but this is worrisome to me." I asked is it painful, he says "no." I asked him does it look like a bruise, he said "no, not really. It's about the size of a dime, not raised, kind of dry feeling." I asked does it itch, he says "no." I asked about other symptoms, he says "I have sore places in my mouth and a sore throat, but no fever or swelling." According to protocol, see PCP within 2 weeks, appointment made for tomorrow at [facility] with Dr. Raoul Pitch, care advice given, patient verbalized understanding.   Reason for Disposition . All other mouth symptoms (Exceptions: dry mouth from not drinking enough liquids, chapped lips)  Answer Assessment - Initial Assessment Questions 1. SYMPTOM: "What's the main symptom you're concerned about?" (e.g., dry mouth. chapped lips, lump)     Purple dime sized place on bottom lip and below lip 2. ONSET: "When did the  ________  start?"     Overnight 3. PAIN: "Is there any pain?" If so, ask: "How bad is it?" (Scale: 1-10; mild, moderate, severe)     No 4. CAUSE: "What do you think is causing the symptoms?"    I don't know 5. OTHER SYMPTOMS: "Do you have any other symptoms?" (e.g., fever, sore throat, toothache, swelling)     Sore throat, sore places in mouth 6. PREGNANCY: "Is there any chance you are pregnant?" "When was your last menstrual period?"     N/A  Protocols used: MOUTH [facility]

## 2017-07-10 ENCOUNTER — Encounter: Payer: Self-pay | Admitting: Family Medicine

## 2017-07-10 ENCOUNTER — Ambulatory Visit: Payer: BLUE CROSS/BLUE SHIELD | Admitting: Family Medicine

## 2017-07-10 VITALS — BP 134/82 | HR 68 | Temp 97.7°F | Ht 73.0 in | Wt 192.8 lb

## 2017-07-10 DIAGNOSIS — K130 Diseases of lips: Secondary | ICD-10-CM | POA: Diagnosis not present

## 2017-07-10 MED ORDER — CLOTRIMAZOLE 1 % EX OINT: TOPICAL_OINTMENT | CUTANEOUS | 0 refills | Status: DC

## 2017-07-10 NOTE — Patient Instructions (Signed)
Start cream to angle of mouth twice a day.  Use chapstick also.  This could be from your dental visit.   This should resolve in 1-2 weeks.    Use mucinex and flonase for nasal drainage.

## 2017-07-10 NOTE — Telephone Encounter (Signed)
Copied from [place] [id number]. Topic: Inquiry >> Jul 10, 2017 10:29 AM Scherrie Gerlach wrote: Reason for CRM:  karen the caregiver called to go over the pt's appointment and what was discussed with the dr.  [name] states she was unable to come today, and needs to document this information.  Would like a call back. Please call on the home number if it is this morning.  Spoke with [name] explained to her information and instructions were provided to patient on his AVS. She verbalized understanding.

## 2017-07-10 NOTE — Progress Notes (Signed)
Phillip [name] , 03-03-1956, 62 y.o., male MRN: [id number] Patient Care Team    Relationship Specialty Notifications Start End  [name], DO PCP - General Family Medicine  11/12/16   [name], MD Consulting Physician Rheumatology  04/11/16   [name], MD Consulting Physician Internal Medicine  11/13/16    Comment: Testosterone  [name], MD Consulting Physician Urology  11/13/16   [name], NP Registered Nurse Neurology  11/13/16    Comment: Dr. Rush Landmark, [name], MD Consulting Physician Cardiology  11/13/16   [name], MD Consulting Physician Gastroenterology  12/10/16     Chief Complaint  Patient presents with  . Lips Changes    pt c/o of purple discoloration on the Lower lip X 1 day.      Subjective: Pt presents for an OV with complaints of right lower lip discoloration of 1 day duration.  Associated symptoms include nothing. He did have a recent dental procedure last week. Area is not painful or itchy. He has tried nothing.  Depression screen [facility] 2/9 07/10/2017 12/10/2016 11/12/2016 08/29/2015 06/20/2015  Decreased Interest 0 0 0 0 0  Down, Depressed, Hopeless 0 0 0 1 0  PHQ - 2 Score 0 0 0 1 0  Altered sleeping - - - - -  Tired, decreased energy - - - - -  Change in appetite - - - - -  Feeling bad or failure about yourself  - - - - -  Trouble concentrating - - - - -  Moving slowly or fidgety/restless - - - - -  Suicidal thoughts - - - - -  PHQ-9 Score - - - - -    Allergies  Allergen Reactions  . Amphetamines     Intolerant to specific formulations: [employer] amphetamine TEVA dextroamp amphetamine  . Lorazepam     Mylan lorazepam; others ok  . Methylprednisolone   . Penicillins Hives  . Propofol     Cognitive delay and emotional   Social History   Tobacco Use  . Smoking status: Former Smoker    Packs/day: 1.50    Years: 25.00    Pack years: 37.50    Types: Cigarettes    Last attempt to quit: 11/22/2014    Years  since quitting: 2.6  . Smokeless tobacco: Never Used  . Tobacco comment: Encouraged to remain smoke free  Substance Use Topics  . Alcohol use: No    Alcohol/week: 0.0 oz    Comment: 8 drinks   Past Medical History:  Diagnosis Date  . ADD (attention deficit disorder)    on adderrall managed by neurology  . Anxiety    on ativan managed by Neurology  . 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 Cellulitis   . Chronic traumatic encephalopathy    right frontal-temproal lobe  . Depression   . Erectile dysfunction 11/2016   follows with urology; sildenafil prescribed  . Meningoencephalitis 1994  . Pneumonia   . Primary hypogonadism in male   . Raynaud's disease 09/13/2015  . Restless legs 09/13/2015  . Rocky Mountain spotted fever 2017  . TBI (traumatic brain injury) ([place]) 1997, 2009   "concussions"   Past Surgical History:  Procedure Laterality Date  . TEE WITHOUT CARDIOVERSION N/A 09/06/2015   Procedure: TRANSESOPHAGEAL ECHOCARDIOGRAM (TEE);  Surgeon: Jerline Pain, MD;  R/O endocarditis;  . TONSILLECTOMY  1965  Family History  Problem Relation Age of Onset  . Hyperlipidemia Father   . Heart disease Maternal Grandfather   . Cancer Paternal Grandfather   . Mental retardation Mother   . Mental illness Brother    Allergies as of 07/10/2017      Reactions   Amphetamines    Intolerant to specific formulations: [employer] amphetamine TEVA dextroamp amphetamine   Lorazepam    Mylan lorazepam; others ok   Methylprednisolone    Penicillins Hives   Propofol    Cognitive delay and emotional      Medication List        Accurate as of 07/10/17  8:51 AM. Always use your most recent med list.          ADDERALL 30 MG tablet Generic drug:  amphetamine-dextroamphetamine Take 1 tablet by mouth 2 (two) times daily.   amitriptyline 25 MG tablet Commonly known as:  ELAVIL Take 1 tablet (25 mg  total) at bedtime by mouth.   amLODipine 2.5 MG tablet Commonly known as:  NORVASC Take 1 tablet (2.5 mg total) by mouth daily. Needs office visit prior to anymore refills   LORazepam 1 MG tablet Commonly known as:  ATIVAN Take 1 tablet (1 mg total) at bedtime by mouth.   TESTOSTERONE IM Inject 3 mLs once a week into the muscle.       All past medical history, surgical history, allergies, family history, immunizations andmedications were updated in the EMR today and reviewed under the history and medication portions of their EMR.     ROS: Negative, with the exception of above mentioned in HPI   Objective:  BP 134/82 (BP Location: Left Arm, Patient Position: Sitting, Cuff Size: Large)   Pulse 68   Temp 97.7 F (36.5 C) (Oral)   Ht 6\' 1"  (1.854 m)   Wt 192 lb 12.8 oz (87.5 kg)   SpO2 98%   BMI 25.44 kg/m  Body mass index is 25.44 kg/m. Gen: Afebrile. No acute distress. Nontoxic in appearance, well developed, well nourished.  HENT: AT. [place].MMM, no oral lesions. Righ tlower lip with small 3 mm light purple area consistent with bruising below vermilion border right lower lip. Mild angular cheilitis/crack right mouth angle. Small line of purplish bruising just proximal lower lip. No exam data present No results found. No results found for this or any previous visit (from the past 24 hour(s)).  Assessment/Plan: Phillip [name] is a 62 y.o. male present for OV for  Angular cheilitis - Question if patient bit his lip or if this could have been secondary to retraction use during dental procedure last week. Advised patient to use Chapstick liberally and Chlortrimazole ointment provided today of her angle/ fissure Should improve over 1-2 weeks, if worsening please follow-up.   Reviewed expectations re: course of current medical issues.  Discussed self-management of symptoms.  Outlined signs and symptoms indicating need for more acute intervention.  Patient verbalized  understanding and all questions were answered.  Patient received an After-Visit Summary.    No orders of the defined types were placed in this encounter.    Note is dictated utilizing voice recognition software. Although note has been proof read prior to signing, occasional typographical errors still can be missed. If any questions arise, please do not hesitate to call for verification.   electronically signed by:  Howard Pouch, DO  [place]

## 2017-07-20 ENCOUNTER — Other Ambulatory Visit: Payer: BLUE CROSS/BLUE SHIELD

## 2017-07-20 DIAGNOSIS — E230 Hypopituitarism: Secondary | ICD-10-CM | POA: Diagnosis not present

## 2017-07-21 LAB — TESTOSTERONE, FREE, TOTAL, SHBG
SEX HORMONE BINDING: 100.7 nmol/L — AB (ref 19.3–76.4)
Testosterone, Free: 4.1 pg/mL — ABNORMAL LOW (ref 7.2–24.0)
Testosterone: 350 ng/dL (ref 264–916)

## 2017-07-24 ENCOUNTER — Telehealth: Payer: Self-pay | Admitting: *Deleted

## 2017-07-24 NOTE — Telephone Encounter (Signed)
Clotrimazole 1% Rx'd on 07/10/17 is not available in ointment only available in cream pharmacy is requesting alternative. Please advise

## 2017-07-24 NOTE — Telephone Encounter (Signed)
Cream is ok.

## 2017-07-24 NOTE — Telephone Encounter (Signed)
Pharmacy notified allright to change to cream

## 2017-07-27 ENCOUNTER — Encounter: Payer: Self-pay | Admitting: Internal Medicine

## 2017-07-28 ENCOUNTER — Other Ambulatory Visit: Payer: Self-pay | Admitting: Internal Medicine

## 2017-07-28 DIAGNOSIS — E230 Hypopituitarism: Secondary | ICD-10-CM

## 2017-07-29 DIAGNOSIS — F321 Major depressive disorder, single episode, moderate: Secondary | ICD-10-CM | POA: Diagnosis not present

## 2017-08-05 ENCOUNTER — Other Ambulatory Visit: Payer: Self-pay | Admitting: Neurology

## 2017-08-05 DIAGNOSIS — R5383 Other fatigue: Secondary | ICD-10-CM

## 2017-08-05 DIAGNOSIS — S0990XS Unspecified injury of head, sequela: Secondary | ICD-10-CM

## 2017-08-05 MED ORDER — ADDERALL 30 MG PO TABS: 30.0000 mg | ORAL_TABLET | Freq: Two times a day (BID) | ORAL | 0 refills | Status: DC

## 2017-08-05 NOTE — Telephone Encounter (Signed)
I have sent the request to Dr [name] for review. Will have the pt pick up at the pharmacy when Dr [name] has sent.

## 2017-08-05 NOTE — Telephone Encounter (Signed)
Phillip [name](on DPR) has called to request refill of ADDERALL 30 MG tablet please be sent to  [id number] - [place], [place] - [address] [phone] (Phone) [phone] (Fax)     For patient

## 2017-08-10 MED ORDER — ADDERALL 30 MG PO TABS: 30.0000 mg | ORAL_TABLET | Freq: Two times a day (BID) | ORAL | 0 refills | Status: DC

## 2017-08-10 NOTE — Telephone Encounter (Signed)
Pt called to advise [facility] is out of the adderall and rx cannot be transferred. He said [facility] has it and needs rx sent there please. He is out of the medication.

## 2017-08-10 NOTE — Addendum Note (Signed)
Addended by: Darleen Crocker on: 08/10/2017 11:22 AM   Modules accepted: Orders

## 2017-08-10 NOTE — Telephone Encounter (Signed)
Called and LVM for Phillip [name] Glad to make her aware that the script has been sent to the pharmacy they requested.

## 2017-09-07 ENCOUNTER — Other Ambulatory Visit: Payer: Self-pay | Admitting: Adult Health

## 2017-09-07 ENCOUNTER — Telehealth: Payer: Self-pay | Admitting: Neurology

## 2017-09-07 DIAGNOSIS — R5383 Other fatigue: Secondary | ICD-10-CM

## 2017-09-07 DIAGNOSIS — S0990XS Unspecified injury of head, sequela: Secondary | ICD-10-CM

## 2017-09-07 MED ORDER — ADDERALL 30 MG PO TABS: 30.0000 mg | ORAL_TABLET | Freq: Two times a day (BID) | ORAL | 0 refills | Status: DC

## 2017-09-07 NOTE — Telephone Encounter (Signed)
I have sent the request to Dr [name] for review.

## 2017-09-07 NOTE — Telephone Encounter (Signed)
Pt request refill for ADDERALL 30 MG tablet sent to [facility]

## 2017-09-08 ENCOUNTER — Telehealth: Payer: Self-pay | Admitting: Neurology

## 2017-09-08 ENCOUNTER — Other Ambulatory Visit: Payer: Self-pay | Admitting: Neurology

## 2017-09-08 NOTE — Telephone Encounter (Signed)
I have sent this request to Dr [name] to review since the most recent office notes described him weaning off the medication. I will wait to hear what Dr [name] decides and then contact the pt once discussing.

## 2017-09-08 NOTE — Telephone Encounter (Signed)
Pt friend(on DPR) has called [id number] (ATIVAN) 1 MG tablet  She states pt is in need of a refill but has been told by pharmacy it has been refused.  Friend states that pt tried to wein off LORazepam (ATIVAN) 1 MG tablet  But could not and just last month was given a refill.  Pt friend states Pt takes 2 of this medication at night.  She was informed that Dr [name] is not in the office today but that a message would be sent to her and NP Megan as well on this request for a refill.  She will wait for call back

## 2017-09-09 ENCOUNTER — Other Ambulatory Visit: Payer: Self-pay | Admitting: Neurology

## 2017-09-09 MED ORDER — LORAZEPAM 1 MG PO TABS: 1.0000 mg | ORAL_TABLET | Freq: Every day | ORAL | 5 refills | Status: DC

## 2017-09-09 NOTE — Telephone Encounter (Signed)
FYI Pt friend called back and the update from RN Myriam Jacobson was given to her.  No questions, no request for a call back

## 2017-09-09 NOTE — Telephone Encounter (Signed)
Discussed with Dr [name] and she is fine with continuing the 2 mg dose since the pt was unable to wean. [facility] [place] [place]

## 2017-09-14 ENCOUNTER — Ambulatory Visit: Payer: BLUE CROSS/BLUE SHIELD | Admitting: Neurology

## 2017-09-14 ENCOUNTER — Encounter: Payer: Self-pay | Admitting: Neurology

## 2017-09-14 VITALS — BP 140/88 | HR 68 | Ht 73.0 in | Wt 188.0 lb

## 2017-09-14 DIAGNOSIS — Z79899 Other long term (current) drug therapy: Secondary | ICD-10-CM

## 2017-09-14 DIAGNOSIS — F5104 Psychophysiologic insomnia: Secondary | ICD-10-CM | POA: Diagnosis not present

## 2017-09-14 DIAGNOSIS — F070 Personality change due to known physiological condition: Secondary | ICD-10-CM

## 2017-09-14 DIAGNOSIS — S0990XS Unspecified injury of head, sequela: Secondary | ICD-10-CM

## 2017-09-14 DIAGNOSIS — R5383 Other fatigue: Secondary | ICD-10-CM | POA: Diagnosis not present

## 2017-09-14 MED ORDER — ADDERALL 30 MG PO TABS: 15.0000 mg | ORAL_TABLET | Freq: Two times a day (BID) | ORAL | 0 refills | Status: DC

## 2017-09-14 MED ORDER — LORAZEPAM 1 MG PO TABS: 2.0000 mg | ORAL_TABLET | Freq: Every day | ORAL | 5 refills | Status: DC
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
## 2017-09-14 NOTE — Patient Instructions (Signed)

## 2017-10-05 ENCOUNTER — Other Ambulatory Visit: Payer: BLUE CROSS/BLUE SHIELD

## 2017-10-07 ENCOUNTER — Other Ambulatory Visit: Payer: BLUE CROSS/BLUE SHIELD

## 2017-10-12 ENCOUNTER — Other Ambulatory Visit: Payer: BLUE CROSS/BLUE SHIELD

## 2017-10-12 DIAGNOSIS — E230 Hypopituitarism: Secondary | ICD-10-CM | POA: Diagnosis not present

## 2017-10-18 LAB — TESTOSTERONE, FREE AND TOTAL (INCLUDES SHBG)-(MALES)
% Free Testosterone: 0.8 %
Free Testosterone, S: 57 pg/mL
Sex Hormone Binding Globulin: 83.5 nmol/L — ABNORMAL HIGH
Testosterone, Serum (Total): 718 ng/dL

## 2017-11-23 DIAGNOSIS — N5201 Erectile dysfunction due to arterial insufficiency: Secondary | ICD-10-CM | POA: Diagnosis not present

## 2017-11-23 DIAGNOSIS — E291 Testicular hypofunction: Secondary | ICD-10-CM | POA: Diagnosis not present

## 2017-11-25 ENCOUNTER — Encounter: Payer: Self-pay | Admitting: Internal Medicine

## 2017-11-25 NOTE — Progress Notes (Signed)
Received last visit note from Dr. 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  He was given refills of generic sildenafil. -Was advised to continue 0.3 cc of testosterone cypionate weekly (200 mg / 1 mL) -On genital exam, he had mildly atrophic right and left testicles and a prostate of approximately 30 g,  without abnormalities

## 2017-12-04 ENCOUNTER — Telehealth: Payer: Self-pay | Admitting: Neurology

## 2017-12-04 DIAGNOSIS — R5383 Other fatigue: Secondary | ICD-10-CM

## 2017-12-04 DIAGNOSIS — S0990XS Unspecified injury of head, sequela: Secondary | ICD-10-CM

## 2017-12-04 MED ORDER — ADDERALL 30 MG PO TABS: 15.0000 mg | ORAL_TABLET | Freq: Two times a day (BID) | ORAL | 0 refills | Status: DC

## 2017-12-04 NOTE — Telephone Encounter (Signed)
Pt's friend Karen/DPR said his adderall is on backorder (needs it to be manufactured by [employer]). She said [facility] [place] [facility] and [facility] tabs. [facility] this medication. She is hoping by the time this runs out it won't be on back order anymore.  She is asking for RX for #25 sent to [facility] and #8 to be sent to [facility]. She said he will be out of the medication tomorrow. Please call to advise.

## 2017-12-04 NOTE — Telephone Encounter (Signed)
Will send 25 pill order to pharmacy today, that should cover his need.

## 2017-12-04 NOTE — Telephone Encounter (Signed)
I called the listed number but cannot hear any voice, just sounds. Unable to give him the message to pick up the pills in [place].

## 2017-12-07 ENCOUNTER — Other Ambulatory Visit: Payer: Self-pay | Admitting: Neurology

## 2017-12-07 DIAGNOSIS — S0990XS Unspecified injury of head, sequela: Secondary | ICD-10-CM

## 2017-12-07 DIAGNOSIS — R5383 Other fatigue: Secondary | ICD-10-CM

## 2017-12-07 MED ORDER — ADDERALL 30 MG PO TABS: 15.0000 mg | ORAL_TABLET | Freq: Two times a day (BID) | ORAL | 0 refills | Status: DC

## 2017-12-07 NOTE — Telephone Encounter (Signed)
Phillip [name] called, she gave the wrong pharmacy for the script. It should be sent [facility] for 25 pill order. She can be reached at [phone] if needed.

## 2017-12-07 NOTE — Telephone Encounter (Signed)
Routed this request to Dr [name] to review and re send the script to the pharmacy at [place].

## 2017-12-07 NOTE — Telephone Encounter (Signed)
Changed pharmacy

## 2017-12-15 ENCOUNTER — Other Ambulatory Visit: Payer: Self-pay | Admitting: Neurology

## 2017-12-15 ENCOUNTER — Telehealth: Payer: Self-pay | Admitting: Neurology

## 2017-12-15 ENCOUNTER — Ambulatory Visit (INDEPENDENT_AMBULATORY_CARE_PROVIDER_SITE_OTHER): Payer: BLUE CROSS/BLUE SHIELD | Admitting: Family Medicine

## 2017-12-15 ENCOUNTER — Encounter: Payer: Self-pay | Admitting: Family Medicine

## 2017-12-15 VITALS — BP 150/100 | HR 73 | Temp 98.0°F | Resp 20 | Ht 73.0 in | Wt 190.0 lb

## 2017-12-15 DIAGNOSIS — F419 Anxiety disorder, unspecified: Secondary | ICD-10-CM

## 2017-12-15 DIAGNOSIS — Z130 Encounter for screening for diseases of the blood and blood-forming organs and certain disorders involving the immune mechanism: Secondary | ICD-10-CM | POA: Diagnosis not present

## 2017-12-15 DIAGNOSIS — Z0000 Encounter for general adult medical examination without abnormal findings: Secondary | ICD-10-CM | POA: Insufficient documentation

## 2017-12-15 DIAGNOSIS — Z131 Encounter for screening for diabetes mellitus: Secondary | ICD-10-CM | POA: Diagnosis not present

## 2017-12-15 DIAGNOSIS — Z0001 Encounter for general adult medical examination with abnormal findings: Secondary | ICD-10-CM | POA: Diagnosis not present

## 2017-12-15 DIAGNOSIS — Z13220 Encounter for screening for lipoid disorders: Secondary | ICD-10-CM

## 2017-12-15 DIAGNOSIS — I7300 Raynaud's syndrome without gangrene: Secondary | ICD-10-CM

## 2017-12-15 DIAGNOSIS — I10 Essential (primary) hypertension: Secondary | ICD-10-CM

## 2017-12-15 DIAGNOSIS — Z79899 Other long term (current) drug therapy: Secondary | ICD-10-CM

## 2017-12-15 LAB — CBC WITH DIFFERENTIAL/PLATELET
Basophils Absolute: 0.1 10*3/uL (ref 0.0–0.1)
Basophils Relative: 1.2 % (ref 0.0–3.0)
EOS ABS: 0.2 10*3/uL (ref 0.0–0.7)
Eosinophils Relative: 4.1 % (ref 0.0–5.0)
HEMATOCRIT: 43.6 % (ref 39.0–52.0)
Hemoglobin: 14.7 g/dL (ref 13.0–17.0)
LYMPHS PCT: 34 % (ref 12.0–46.0)
Lymphs Abs: 1.8 10*3/uL (ref 0.7–4.0)
MCHC: 33.8 g/dL (ref 30.0–36.0)
MCV: 92 fl (ref 78.0–100.0)
Monocytes Absolute: 0.5 10*3/uL (ref 0.1–1.0)
Monocytes Relative: 10.5 % (ref 3.0–12.0)
Neutro Abs: 2.6 10*3/uL (ref 1.4–7.7)
Neutrophils Relative %: 50.2 % (ref 43.0–77.0)
Platelets: 315 10*3/uL (ref 150.0–400.0)
RBC: 4.74 Mil/uL (ref 4.22–5.81)
RDW: 13.8 % (ref 11.5–15.5)
WBC: 5.2 10*3/uL (ref 4.0–10.5)

## 2017-12-15 LAB — COMPREHENSIVE METABOLIC PANEL
ALT: 44 U/L (ref 0–53)
AST: 64 U/L — AB (ref 0–37)
Albumin: 4.4 g/dL (ref 3.5–5.2)
Alkaline Phosphatase: 86 U/L (ref 39–117)
BUN: 19 mg/dL (ref 6–23)
CALCIUM: 9.4 mg/dL (ref 8.4–10.5)
CO2: 32 meq/L (ref 19–32)
CREATININE: 0.94 mg/dL (ref 0.40–1.50)
Chloride: 101 mEq/L (ref 96–112)
GFR: 86.9 mL/min (ref >60.00)
Glucose, Bld: 100 mg/dL — ABNORMAL HIGH (ref 70–99)
Potassium: 4.9 mEq/L (ref 3.5–5.1)
Sodium: 136 mEq/L (ref 135–145)
Total Bilirubin: 0.8 mg/dL (ref 0.2–1.2)
Total Protein: 6.8 g/dL (ref 6.0–8.3)

## 2017-12-15 LAB — LIPID PANEL
Cholesterol: 211 mg/dL — ABNORMAL HIGH (ref 0–200)
HDL: 61.6 mg/dL (ref >39.00)
LDL Cholesterol: 133 mg/dL — ABNORMAL HIGH (ref 0–99)
NONHDL: 149.28
Total CHOL/HDL Ratio: 3
Triglycerides: 83 mg/dL (ref 0.0–149.0)
VLDL: 16.6 mg/dL (ref 0.0–40.0)

## 2017-12-15 LAB — TSH: TSH: 2.67 u[IU]/mL (ref 0.35–4.50)

## 2017-12-15 LAB — HEMOGLOBIN A1C: Hgb A1c MFr Bld: 5.5 % (ref 4.6–6.5)

## 2017-12-15 MED ORDER — AMPHETAMINE-DEXTROAMPHETAMINE 30 MG PO TABS: 30.0000 mg | ORAL_TABLET | Freq: Every day | ORAL | 0 refills | Status: DC

## 2017-12-15 MED ORDER — AMLODIPINE BESYLATE 2.5 MG PO TABS: 2.5000 mg | ORAL_TABLET | Freq: Every day | ORAL | 0 refills | Status: DC

## 2017-12-15 NOTE — Patient Instructions (Signed)
Follow up in 1 month for blood pressure recheck. Restart the amlodipine 2.5 mg daily.       Health Maintenance, Male A healthy lifestyle and preventive care is important for your health and wellness. Ask your health care provider about what schedule of regular examinations is right for you. What should I know about weight and diet? Eat a Healthy Diet  Eat plenty of vegetables, fruits, whole grains, low-fat dairy products, and lean protein.  Do not eat a lot of foods high in solid fats, added sugars, or salt.  Maintain a Healthy Weight Regular exercise can help you achieve or maintain a healthy weight. You should:  Do at least 150 minutes of exercise each week. The exercise should increase your heart rate and make you sweat (moderate-intensity exercise).  Do strength-training exercises at least twice a week.  Watch Your Levels of Cholesterol and Blood Lipids  Have your blood tested for lipids and cholesterol every 5 years starting at 60 years of age. If you are at high risk for heart disease, you should start having your blood tested when you are 60 years old. You may need to have your cholesterol levels checked more often if: ? Your lipid or cholesterol levels are high. ? You are older than 60 years of age. ? You are at high risk for heart disease.  What should I know about cancer screening? Many types of cancers can be detected early and may often be prevented. Lung Cancer  You should be screened every year for lung cancer if: ? You are a current smoker who has smoked for at least 30 years. ? You are a former smoker who has quit within the past 15 years.  Talk to your health care provider about your screening options, when you should start screening, and how often you should be screened.  Colorectal Cancer  Routine colorectal cancer screening usually begins at 60 years of age and should be repeated every 5-10 years until you are 60 years old. You may need to be screened more  often if early forms of precancerous polyps or small growths are found. Your health care provider may recommend screening at an earlier age if you have risk factors for colon cancer.  Your health care provider may recommend using home test kits to check for hidden blood in the stool.  A small camera at the end of a tube can be used to examine your colon (sigmoidoscopy or colonoscopy). This checks for the earliest forms of colorectal cancer.  Prostate and Testicular Cancer  Depending on your age and overall health, your health care provider may do certain tests to screen for prostate and testicular cancer.  Talk to your health care provider about any symptoms or concerns you have about testicular or prostate cancer.  Skin Cancer  Check your skin from head to toe regularly.  Tell your health care provider about any new moles or changes in moles, especially if: ? There is a change in a mole's size, shape, or color. ? You have a mole that is larger than a pencil eraser.  Always use sunscreen. Apply sunscreen liberally and repeat throughout the day.  Protect yourself by wearing long sleeves, pants, a wide-brimmed hat, and sunglasses when outside.  What should I know about heart disease, diabetes, and high blood pressure?  If you are 42-92 years of age, have your blood pressure checked every 3-5 years. If you are 91 years of age or older, have your blood pressure checked  every year. You should have your blood pressure measured twice-once when you are at a hospital or clinic, and once when you are not at a hospital or clinic. Record the average of the two measurements. To check your blood pressure when you are not at a hospital or clinic, you can use: ? An automated blood pressure machine at a pharmacy. ? A home blood pressure monitor.  Talk to your health care provider about your target blood pressure.  If you are between 66-78 years old, ask your health care provider if you should take  aspirin to prevent heart disease.  Have regular diabetes screenings by checking your fasting blood sugar level. ? If you are at a normal weight and have a low risk for diabetes, have this test once every three years after the age of 69. ? If you are overweight and have a high risk for diabetes, consider being tested at a younger age or more often.  A one-time screening for abdominal aortic aneurysm (AAA) by ultrasound is recommended for men aged 64-75 years who are current or former smokers. What should I know about preventing infection? Hepatitis B If you have a higher risk for hepatitis B, you should be screened for this virus. Talk with your health care provider to find out if you are at risk for hepatitis B infection. Hepatitis C Blood testing is recommended for:  Everyone born from [birth year] through 1965.  Anyone with known risk factors for hepatitis C.  Sexually Transmitted Diseases (STDs)  You should be screened each year for STDs including gonorrhea and chlamydia if: ? You are sexually active and are younger than 60 years of age. ? You are older than 60 years of age and your health care provider tells you that you are at risk for this type of infection. ? Your sexual activity has changed since you were last screened and you are at an increased risk for chlamydia or gonorrhea. Ask your health care provider if you are at risk.  Talk with your health care provider about whether you are at high risk of being infected with HIV. Your health care provider may recommend a prescription medicine to help prevent HIV infection.  What else can I do?  Schedule regular health, dental, and eye exams.  Stay current with your vaccines (immunizations).  Do not use any tobacco products, such as cigarettes, chewing tobacco, and e-cigarettes. If you need help quitting, ask your health care provider.  Limit alcohol intake to no more than 2 drinks per day. One drink equals 12 ounces of beer, 5 ounces of  wine, or 1 ounces of hard liquor.  Do not use street drugs.  Do not share needles.  Ask your health care provider for help if you need support or information about quitting drugs.  Tell your health care provider if you often feel depressed.  Tell your health care provider if you have ever been abused or do not feel safe at home. This information is not intended to replace advice given to you by your health care provider. Make sure you discuss any questions you have with your health care provider. Document Released: 10/18/2007 Document Revised: 12/19/2015 Document Reviewed: 01/23/2015 Elsevier Interactive Patient Education  [employer].

## 2017-12-15 NOTE — Addendum Note (Signed)
Addended by: Larey Seat on: 12/15/2017 05:17 PM   Modules accepted: Orders

## 2017-12-15 NOTE — Progress Notes (Signed)
Patient ID: Phillip [name], male  DOB: March 15, 1956, 62 y.o.   MRN: [id number] Patient Care Team    Relationship Specialty Notifications Start End  [name], DO PCP - General Family Medicine  11/12/16   [name], MD Consulting Physician Rheumatology  04/11/16   [name], MD Consulting Physician Internal Medicine  11/13/16    Comment: Testosterone  [name], MD Consulting Physician Urology  11/13/16   [name], NP Registered Nurse Neurology  11/13/16    Comment: Dr. Rush Landmark, [name], MD Consulting Physician Cardiology  11/13/16   [name], MD Consulting Physician Gastroenterology  12/10/16     Chief Complaint  Patient presents with  . Annual Exam    Subjective:  Phillip [name] is a 62 y.o. male present for CPE. All past medical history, surgical history, allergies, family history, immunizations, medications and social history were updated in the electronic medical record today. All recent labs, ED visits and hospitalizations within the last year were reviewed.  Health maintenance:  Colonoscopy: last screen 07/2014, recommend follow up 5 year; resulted 2 polyps and diverticulosis.. Completed by Dr. Benson Norway. Immunizations:  tdap 2012 UTD, influenza (not a candidate),  Shingrix 2 part series discussed. He will think about it and discuss with neurologist as well.  Infectious disease screening: HIV declined , Hep C completed PSA:  Lab Results  Component Value Date   PSA 0.59 12/19/2016   PSA 0.72 12/10/2016   PSA 0.57 04/12/2014  , pt was counseled on prostate cancer screenings.  Assistive device: None Oxygen GTX:MIWO Patient has a Dental home. Hospitalizations/ED visits: none  Depression screen [facility] 2/9 12/15/2017 07/10/2017 12/10/2016 11/12/2016 08/29/2015  Decreased Interest 0 0 0 0 0  Down, Depressed, Hopeless 0 0 0 0 1  PHQ - 2 Score 0 0 0 0 1  Altered sleeping - - - - -  Tired, decreased energy - - - - -  Change in appetite - - - - -    Feeling bad or failure about yourself  - - - - -  Trouble concentrating - - - - -  Moving slowly or fidgety/restless - - - - -  Suicidal thoughts - - - - -  PHQ-9 Score - - - - -   No flowsheet data found.   Current Exercise Habits: Structured exercise class, Type of exercise: strength training/weights, Time (Minutes): 60, Frequency (Times/Week): 3, Weekly Exercise (Minutes/Week): 180, Intensity: Moderate Exercise limited by: None identified Fall Risk  12/15/2017 12/10/2016 08/29/2015 06/20/2015 04/12/2014  Falls in the past year? No No No No No      Immunization History  Administered Date(s) Administered  . Tdap 05/05/2010     Past Medical History:  Diagnosis Date  . ADD (attention deficit disorder)    on adderrall managed by neurology  . Anxiety    on ativan managed by Neurology  . 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 Cellulitis   . Chronic traumatic encephalopathy    right frontal-temproal lobe  . Depression   . Erectile dysfunction 11/2016   follows with urology; sildenafil prescribed  . Meningoencephalitis 1994  . Pneumonia   . Primary hypogonadism in male   . Raynaud's disease 09/13/2015  . Restless legs 09/13/2015  . [place] spotted fever 2017  . TBI (traumatic brain injury) ([place]) 1997, 2009   "concussions"   Allergies  Allergen Reactions  . Amphetamines     Intolerant to specific formulations: [employer] amphetamine TEVA dextroamp amphetamine  . Lorazepam     Mylan lorazepam; others ok  . Methylprednisolone   . Penicillins Hives  . Propofol     Cognitive delay and emotional   Past Surgical History:  Procedure Laterality Date  . TEE WITHOUT CARDIOVERSION N/A 09/06/2015   Procedure: TRANSESOPHAGEAL ECHOCARDIOGRAM (TEE);  Surgeon: Jerline Pain, MD;  R/O endocarditis;  . TONSILLECTOMY  1965   Family History  Problem Relation Age of Onset  . Hyperlipidemia  Father   . Heart disease Maternal Grandfather   . Cancer Paternal Grandfather   . Mental retardation Mother   . Mental illness Brother    Social History   Socioeconomic History  . Marital status: Divorced    Spouse name: Not on file  . Number of children: 2  . Years of education: 91  . Highest education level: Not on file  Occupational History  . Occupation: Retired  Scientific laboratory technician  . Financial resource strain: Not on file  . Food insecurity:    Worry: Not on file    Inability: Not on file  . Transportation needs:    Medical: Not on file    Non-medical: Not on file  Tobacco Use  . Smoking status: Former Smoker    Packs/day: 1.50    Years: 25.00    Pack years: 37.50    Types: Cigarettes    Last attempt to quit: 11/22/2014    Years since quitting: 3.0  . Smokeless tobacco: Never Used  . Tobacco comment: Encouraged to remain smoke free  Substance and Sexual Activity  . Alcohol use: No    Alcohol/week: 0.0 standard drinks    Comment: 8 drinks  . Drug use: No  . Sexual activity: Yes    Partners: Female  Lifestyle  . Physical activity:    Days per week: Not on file    Minutes per session: Not on file  . Stress: Not on file  Relationships  . Social connections:    Talks on phone: Not on file    Gets together: Not on file    Attends religious service: Not on file    Active member of club or organization: Not on file    Attends meetings of clubs or organizations: Not on file    Relationship status: Not on file  . Intimate partner violence:    Fear of current or ex partner: Not on file    Emotionally abused: Not on file    Physically abused: Not on file    Forced sexual activity: Not on file  Other Topics Concern  . Not on file  Social History Narrative   Divorced. B.A. degree. Retired.   Drinks caffeine, uses herbal remedies, takes a daily vitamin.   Wears his seatbelt.  Smoke detector in the home.   Firearms locked in the home.   Feels safe in relationships.     Allergies as of 12/15/2017      Reactions   Amphetamines    Intolerant to specific formulations: [employer] amphetamine TEVA dextroamp amphetamine   Lorazepam    Mylan lorazepam; others ok   Methylprednisolone    Penicillins Hives   Propofol    Cognitive delay and emotional      Medication List        Accurate as of 12/15/17 10:11 AM. Always use your most recent med list.  amLODipine 2.5 MG tablet Commonly known as:  NORVASC Take 1 tablet (2.5 mg total) by mouth daily. Needs office visit prior to anymore refills   LORazepam 1 MG tablet Commonly known as:  ATIVAN Take 2 tablets (2 mg total) by mouth at bedtime.   TESTOSTERONE IM Inject 3 mLs once a week into the muscle.      All past medical history, surgical history, allergies, family history, immunizations andmedications were updated in the EMR today and reviewed under the history and medication portions of their EMR.     Recent Results (from the past 2160 hour(s))  Testosterone Free with SHBG     Status: Abnormal   Collection Time: 10/12/17  9:41 AM  Result Value Ref Range   Testosterone, Serum (Total) 718 ng/dL    Comment: Reference Range: Adult Males >18 years    264 - 916 This [facility] LC/MS-MS method is currently certified by the [facility] Hormone Standardization Program (HoST).  Adult male reference interval is based on a population of healthy nonobese males (BMI <30) between 81 and 7 years old. Maximiano Coss 5809,983;3825-0539 PMID: [id number].    % Free Testosterone 0.8 %    Comment: Reference Range: Adult Males: 1.5 - 3.2    Free Testosterone, S 57 pg/mL    Comment: Reference Range: Adult Males: 56 - 280    Sex Hormone Binding Globulin 83.5 (H) nmol/L    Comment: Reference Range: >49y: 19.3 - 76.4     ROS: 14 pt review of systems performed and negative (unless mentioned in an HPI)  Objective: BP (!) 150/100 (BP Location: Left Arm, Patient Position: Sitting, Cuff Size: Large)    Pulse 73   Temp 98 F (36.7 C)   Resp 20   Ht 6' 1"  (1.854 m)   Wt 190 lb (86.2 kg)   SpO2 99%   BMI 25.07 kg/m  Gen: Afebrile. No acute distress. Nontoxic in appearance, well-developed, well-nourished,  Pleasant caucasian male.  HENT: AT. [place]. Bilateral TM visualized and normal in appearance, normal external auditory canal. MMM, no oral lesions, adequate dentition. Bilateral nares within normal limits. Throat without erythema, ulcerations or exudates. no Cough on exam, no hoarseness on exam. Eyes:Pupils Equal Round Reactive to light, Extraocular movements intact,  Conjunctiva without redness, discharge or icterus. Neck/lymp/endocrine: Supple,no lymphadenopathy, no thyromegaly CV: RRR no murmur, no edema, +2/4 P posterior tibialis pulses. no carotid bruits. No JVD. Chest: CTAB, no wheeze, rhonchi or crackles. normal Respiratory effort. good Air movement. Abd: Soft. flat. NTND. BS present. no Masses palpated. No hepatosplenomegaly. No rebound tenderness or guarding. Skin: no rashes, purpura or petechiae. Warm and well-perfused. Skin intact. Neuro/Msk:  Normal gait. PERLA. EOMi. Alert. Oriented x3.  Cranial nerves II through XII intact. Muscle strength 5/5 upper/lower extremity. DTRs equal bilaterally. Psych: Normal affect, dress and demeanor. Normal speech. Normal thought content and judgment.  No exam data present  Assessment/plan: TRAVELL DESAULNIERS is a 62 y.o. male present for CPE Encounter for long-term current use of medication - Comp Met (CMET) Screening for diabetes mellitus - HgB A1c Lipid screening - Lipid panel Screening for iron deficiency anemia/on testosterone supplement - CBC w/Diff Anxiety - TSH Raynaud's disease without gangrene/Essential hypertension - Restart amlodipine 2.5 mg QD. Was initially provided for raynaud's, but since he stopped taking his BP has increased.  - f/u 1 month  Encounter for general adult medical examination with abnormal findings Patient was  encouraged to exercise greater than 150 minutes a week. Patient was encouraged  to choose a diet filled with fresh fruits and vegetables, and lean meats. AVS provided to patient today for education/recommendation on gender specific health and safety maintenance. Colonoscopy: last screen 07/2014, recommend follow up 5 year; resulted 2 polyps and diverticulosis.. Completed by Dr. Benson Norway. Immunizations:  tdap 2012 UTD, influenza (not a candidate),  Shingrix 2 part series discussed. He will think about it and discuss with neurologist as well.  Infectious disease screening: HIV declined , Hep C completed PSA collected at uro  Return in about 1 year (around 12/16/2018) for CPE. F/U 1 month for provider appt on BP recheck.   Note is dictated utilizing voice recognition software. Although note has been proof read prior to signing, occasional typographical errors still can be missed. If any questions arise, please do not hesitate to call for verification.  Electronically signed by: Howard Pouch, DO [place]

## 2017-12-15 NOTE — Telephone Encounter (Signed)
I have routed this request to Dr Dohmeier for review. The pt is due for the medication and [place] registry was verified.  

## 2017-12-15 NOTE — Addendum Note (Signed)
Addended by: Gerline Legacy C on: 12/15/2017 04:00 PM   Modules accepted: Orders

## 2017-12-15 NOTE — Telephone Encounter (Signed)
Pt's girlfriend called request new rx for adderall 30mg  (plain) tabs #35. Please send to [facility]. This should complete the request for August.

## 2017-12-16 ENCOUNTER — Encounter: Payer: Self-pay | Admitting: Family Medicine

## 2017-12-16 ENCOUNTER — Telehealth: Payer: Self-pay | Admitting: Family Medicine

## 2017-12-16 NOTE — Telephone Encounter (Signed)
Spoke with patient reviewed lab results and instructions. Patient verbalized understanding. 

## 2017-12-16 NOTE — Telephone Encounter (Signed)
Please inform patient the following information: His labs are stable, with the exception of an elevated AST- which is a liver enzyme.  - many causes are possible for elevated AST including tylenol and alcohol consumption.  - he was asked to follow up w/ provider appt in 3-4 weeks for his BP, we will recheck his liver enzymes at that visit as well to ensure returned to normal.

## 2017-12-22 ENCOUNTER — Encounter: Payer: Self-pay | Admitting: Internal Medicine

## 2017-12-22 ENCOUNTER — Ambulatory Visit: Payer: BLUE CROSS/BLUE SHIELD | Admitting: Internal Medicine

## 2017-12-22 VITALS — BP 158/82 | HR 70 | Ht 73.0 in | Wt 192.4 lb

## 2017-12-22 DIAGNOSIS — N529 Male erectile dysfunction, unspecified: Secondary | ICD-10-CM

## 2017-12-22 DIAGNOSIS — E230 Hypopituitarism: Secondary | ICD-10-CM

## 2017-12-22 DIAGNOSIS — E280 Estrogen excess: Secondary | ICD-10-CM

## 2017-12-22 LAB — PSA: PSA: 0.8 ng/mL (ref 0.10–4.00)

## 2017-12-22 NOTE — Patient Instructions (Addendum)
Please continue 0.3 ml/week Testosterone.  Stop at the lab.  Please come back in June, between 8-9 am, fasting.

## 2017-12-22 NOTE — Progress Notes (Signed)
Patient ID: Phillip [name], male   DOB: 05-30-1955, 62 y.o.   MRN: [id number]   HPI: Phillip [name] is a 62 y.o.-year-old man, initially referred by his neurologist, Dr. [name], now returning for follow-up for hypogonadism. Last visit 6 mo ago.  He was recently dx'ed with HTN. He takes Amlodipine 2.5 mg daily.  He continues on 0.3 mL of testosterone cypionate weekly.  He had a recent testosterone level on 10/12/2017, which was normal: Component     Latest Ref Rng & Units 10/12/2017  Testosterone, Serum (Total)     ng/dL 718  % Free Testosterone     % 0.8  Free Testosterone, S     pg/mL 57  Sex Hormone Binding Globulin     nmol/L 83.5 (H)   We tried to reduce the dose to 0.2 mL weekly but he did not tolerate this well due to fatigue.  Of note, his PSA (12/2016) and CBC were normal (12/2017).  No breast tenderness.  He has ED, but libido is fair.  He is seeing Dr. Diona Fanti with urology.  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  He was given refills of generic sildenafil. 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 He had a subsequent visit with his PCP in 09/2015 >> a testosterone level was found to be extremely low.   Received records from urology >> reviewed: Patient's initial free testosterone level obtained by PCP was 0.4 pg/mL (09/17/2015). At that time, he complained of low libido, testicular atrophy, difficulty obtaining and maintaining an erection. A repeat testosterone level obtained by an integrative medicine provider in [place] (09/25/2015) showed again very low testosterone level:  Total Testosterone <3 ng/dL, and the free testosterone of 0.2 ng/dL. DHEAS was 122.4 (48.9-344.2). Of note, no LH and FSH levels were drawn at that time. At that point, he was started on testosterone cypionate 8 IM 0.8 mL every other week. This has not improved his libido significantly, but it helped him obtaining morning erections.  A genital exam was performed on 11/09/2015 by his urologist: Atrophic left and right testes. No tenderness, swelling, masses, varicocele, or hydrocele. No abnormality of the penis. Circumcised status. Prostate: 2+ in size, 40 g. Normal consistency  Dr. Diona Fanti obtain the following labs - 11/09/2015 - collection time: 3:34 PM  Total testosterone 1125.7 (300-890) Free testosterone 175.3 (47-244) SHBG 66.3 (10-57)  FSH 0.1, LH 0.1 Prolactin 6.3 Of note, at the time of the above collection, patient was taking 0.8 mL testosterone every other week in his deltoid. The dose was changed at that time to 0.5 mL every week in his quadriceps, after which his fatigue improved a little, and he had more energy.   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 He saw Dr. [name], who obtained a  brain  + pituitary MRI (10/24/2015). 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 There was no abnormality in the pituitary gland.  Further reviewed history: He admits for previous decreased libido >> normal  now Had difficulty obtaining or maintaining an erection >> not anymore No trauma to testes, testicular irradiation or surgery No h/o of mumps orchitis/h/o autoimmune ds. No h/o cryptorchidism He grew and went through puberty like his peers + shrinking of testes. No very small testes (<5 ml) No incomplete/delayed sexual development     No breast discomfort/gynecomastia    No loss of body hair  (axillary/pubic)/decreased need for shaving No height loss No abnormal sense of smell  + hot flushes, resolved No vision problems, other than blurry vision in R eye, also photopsia - started 11/2015 No worst HA of his life He does have a history of head trauma in 1997, when a tree fell on top of his head and he lost consciousness for more than 30 minutes; in 2009 he fell off ladder and landed on back of his head No FH of hypogonadism/infertility No personal h/o infertility - has 2 children: 25 and 18 years old  No FH of hemochromatosis or pituitary tumors No excessive weight gain or loss.  No chronic diseases, but recently diagnosed with RMSF Spring 2017 No chronic pain. Not on opiates, does not take steroids, had a course this spring - ~1 week He was drinking Alcohol: ~2 drinks a day up to 2015 >>  stopped completely since then No anabolic steroids use No herbal medicines. Not on antidepressants  No AI ds in his family, no FH of MS. He does not have family history of early cardiac disease.  Reviewed pertinent labs: Component     Latest Ref Rng & Units 03/31/2016 09/26/2016 12/19/2016  Testosterone     264 - 916 ng/dL 762 243 (L) 974 (H)  Testosterone Free     7.2 - 24.0 pg/mL 8.5 2.8 (L) 11.6  Sex Horm Binding Glob, Serum     19.3 - 76.4 nmol/L 85.3 (H) 102.4 (H) 93.5 (H)   Previously: Component     Latest Ref Rng & Units 01/28/2016  Testosterone     264 - 916 ng/dL 1,103 (H)  Testosterone Free     7.2 - 24.0 pg/mL 15.1  Sex Horm Binding Glob, Serum     19.3 - 76.4 nmol/L 80.8 (H)  TSH     0.35 - 4.50 uIU/mL 1.90  Triiodothyronine,Free,Serum     2.3 - 4.2 pg/mL 3.9  T4,Free(Direct)     0.60 - 1.60 ng/dL 0.80  FSH     1.4 - 18.1 mIU/ML 0.1 (L)  Cortisol, Plasma     ug/dL 13.1  LH     1.50 - 9.30 mIU/mL 0.03 (L)  hCG Quant     0 - 3 mIU/mL <1  Total testosterone level is elevated, however free testosterone is normal. SHBG is high, which is desirable. Cortisol level is  normal, no elevated beta hCG. Normal thyroid tests.  Component     Latest Ref Rng & Units 01/28/2016  IGF-I, LC/MS     50 - 317 ng/mL 140  Z-Score (Male)     -2.0 - 2.0 SD 0.1  Estradiol, Free     ADULTS: < OR = 0.45 pg/mL 1.06 (H)  Estradiol     ADULTS: < OR = 29 pg/mL 53 (H)  Prolactin     2.0 - 18.0 ng/mL 8.1   IGF-I is normal. Estrogen level is high, Likely secondary to testosterone therapy. Prolactin level is normal.  ROS:  + See HPI Constitutional: no weight gain/no weight loss, no fatigue, no subjective hyperthermia, no subjective hypothermia Eyes: no blurry  vision, no xerophthalmia ENT: no sore throat, no nodules palpated in throat, no dysphagia, no odynophagia, no hoarseness Cardiovascular: no CP/no SOB/no palpitations/no leg swelling Respiratory: no cough/no SOB/no wheezing Gastrointestinal: no N/no V/no D/no C/no acid reflux Musculoskeletal: no muscle aches/no joint aches Skin: no rashes, no hair loss Neurological: no tremors/no numbness/no tingling/no dizziness  I reviewed pt's medications, allergies, PMH, social hx, family hx, and changes were documented in the history of present illness. Otherwise, unchanged from my initial visit note.  Past Medical History:  Diagnosis Date  . ADD (attention deficit disorder)    on adderrall managed by neurology  . Anxiety    on ativan managed by Neurology  . 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 Cellulitis   . Chronic traumatic encephalopathy    right frontal-temproal lobe  . Depression   . Erectile dysfunction 11/2016   follows with urology; sildenafil prescribed  . Meningoencephalitis 1994  . Pneumonia   . Primary hypogonadism in male   . Raynaud's disease 09/13/2015  . Restless legs 09/13/2015  . [place] spotted fever 2017  . TBI (traumatic brain injury) ([place]) 1997, 2009   "concussions"   Past Surgical History:   Procedure Laterality Date  . TEE WITHOUT CARDIOVERSION N/A 09/06/2015   Procedure: TRANSESOPHAGEAL ECHOCARDIOGRAM (TEE);  Surgeon: Jerline Pain, MD;  R/O endocarditis;  . TONSILLECTOMY  1965   Social History   Social History  . Marital status: Single    Spouse name: N/A  . Number of children: 2   Occupational History  . n/a   Social History Main Topics  . Smoking status: Former Smoker    Packs/day: 1.50    Years: 25.00    Types: Cigarettes    Quit date: 2002  . Smokeless tobacco: Never Used     Comment: Encouraged to remain smoke free  . Alcohol use No     Comment: 8 drinks  . Drug use: No   Social History Narrative   Divorced. Education: [employer].   Current Outpatient Medications on File Prior to Visit  Medication Sig Dispense Refill  . amLODipine (NORVASC) 2.5 MG tablet Take 1 tablet (2.5 mg total) by mouth daily. Needs office visit prior to anymore refills 90 tablet 0  . amphetamine-dextroamphetamine (ADDERALL) 30 MG tablet Take 1-2 tablets by mouth daily. 35 tablet 0  . LORazepam (ATIVAN) 1 MG tablet Take 2 tablets (2 mg total) by mouth at bedtime. 60 tablet 5  . TESTOSTERONE IM Inject 3 mLs once a week into the muscle.      No current facility-administered medications on file prior to visit.    Allergies  Allergen Reactions  . Amphetamines     Intolerant to specific formulations: [employer] amphetamine TEVA dextroamp amphetamine  . Lorazepam     Mylan lorazepam; others ok  . Methylprednisolone   . Penicillins Hives  . Propofol     Cognitive delay and emotional   Family History  Problem Relation Age of Onset  . Hyperlipidemia Father   . Heart disease Maternal Grandfather   . Cancer Paternal Grandfather   . Mental illness Mother   . Mental illness Brother    PE: BP (!) 158/82   Pulse 70   Ht 6\' 1"  (1.854 m)   Wt 192 lb 6.4 oz (87.3 kg)   SpO2 98%   BMI 25.38 kg/m  Wt Readings from Last 3 Encounters:  12/22/17  192 lb 6.4 oz (87.3 kg)  12/15/17  190 lb (86.2 kg)  09/14/17 188 lb (85.3 kg)   Constitutional: Normal weight, in NAD Eyes: PERRLA, EOMI, no exophthalmos ENT: moist mucous membranes, no thyromegaly, no cervical lymphadenopathy Cardiovascular: RRR, No MRG Respiratory: CTA B Gastrointestinal: abdomen soft, NT, ND, BS+ Musculoskeletal: no deformities, strength intact in all 4 Skin: moist, warm, no rashes Neurological: no tremor with outstretched hands, DTR normal in all 4'  ASSESSMENT: 1. 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 Labs were drawn soon after diagnosis of RMSF, which, for him, was a fulminant, systemic, disease. Usually, the pituitary gland will shunt the production of testosterone or other hormones deemed "unnecessary" during acute illness. Testosterone levels could be quite low in this situation. 2. Labs were drawn also after a prednisone course, but I do not have a clear time sequence 3. He has a history of head trauma x2, which could have caused pituitary insufficiency 4. He has a history of alcohol use, however, he relates that he stopped in 2015  2. ED  PLAN:  1. Hypogonadism - History is obtained from the patient and also from his wife, who clarifies information about his symptoms, testosterone administration and past history - He was diagnosed with hypogonadotropic hypogonadism approximately 2 years ago during an episode of RMSF, during which his testosterone was checked and it was in the castrate level.  He was started on testosterone afterwards and referred to me for further investigation.  We checked his pituitary status - pituitary hormone levels along with a pituitary MRI were normal.  His LH and FSH were low, as expected on testosterone treatment.  At that time, we discussed about the fact that he is hypothalamic-pituitary-testicular axis will most likely start to recover, but it is unclear whether it would recover completely.  We started to  decrease his testosterone dosing but so far the lowest dose that he could tolerate was 0.3 mL weekly.  On this dose, recent testosterone level was normal in the urologist office.  He would be interested in coming off testosterone, however, he does feel more tired when he is trying to reduce the dose.  We did discuss about trying Clomid, which is an option especially since his estrogen levels were slightly high.  We will need to repeat this at the next lab draw.  However, for now, I suggested to stay on the current dose of 0.3 mL weekly, which he tolerates well.  Patient and his wife agree with the plan - Reviewed his recent testosterone level from June and this was normal - Received last visit note from Dr. 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  He was given refills of generic sildenafil. -Was advised to continue 0.3 cc of testosterone cypionate weekly (200 mg / 1 mL) -On genital exam, he had mildly atrophic right and left testicles and a prostate of approximately 30 g,  without abnormalities - At this visit, we will check a PSA and another estradiol level - will see him back in 10 months  2. ED -Vascular origin, worsening, per notes from Dr. Diona Fanti, his urologist -Continues PDE 5 inhibitor per Dr. Diona Fanti -He tolerates this well  Office Visit on 12/22/2017  Component Date Value Ref Range Status  . Estradiol, Free 12/22/2017 0.38  pg/mL Final   Comment: Reference Range: ADULTS: < OR = 0.45   . Estradiol 12/22/2017 19  pg/mL Final   Comment: Reference Range: ADULTS: < OR = 29 . This test was developed and its analytical performance characteristics have been determined by [facility]. It has not been cleared or approved by FDA. This assay has been validated pursuant to the CLIA regulations and is used for clinical purposes.   Marland Kitchen PSA 12/22/2017 0.80  0.10 - 4.00 ng/mL  Final   Test performed using Access Hybritech PSA Assay, a parmagnetic partical, chemiluminecent immunoassay.   Labs are great, with normalized estradiol and stable PSA.  [name], MD PhD [facility]

## 2017-12-23 NOTE — Telephone Encounter (Signed)
error 

## 2017-12-29 LAB — ESTRADIOL, FREE
Estradiol, Free: 0.38 pg/mL
Estradiol: 19 pg/mL

## 2017-12-30 ENCOUNTER — Encounter: Payer: Self-pay | Admitting: Internal Medicine

## 2018-01-05 ENCOUNTER — Other Ambulatory Visit: Payer: Self-pay | Admitting: Neurology

## 2018-01-05 MED ORDER — AMPHETAMINE-DEXTROAMPHETAMINE 30 MG PO TABS: 30.0000 mg | ORAL_TABLET | Freq: Every day | ORAL | 0 refills | Status: DC

## 2018-01-05 NOTE — Telephone Encounter (Signed)
I have routed this request to Dr Dohmeier for review. The pt is due for the medication and [place] registry was verified.  

## 2018-01-05 NOTE — Telephone Encounter (Signed)
Patient's friend [name] (on [place]) requesting refill for amphetamine-dextroamphetamine (ADDERALL) 30 MG tablet takes twice daily [facility] [place].

## 2018-01-06 MED ORDER — AMPHETAMINE-DEXTROAMPHETAMINE 30 MG PO TABS
30.0000 mg | ORAL_TABLET | Freq: Every day | ORAL | 0 refills | Status: DC
Start: 2018-01-06 — End: 2018-02-02

## 2018-01-06 NOTE — Addendum Note (Signed)
Addended by: Darleen Crocker on: 01/06/2018 01:11 PM   Modules accepted: Orders

## 2018-01-06 NOTE — Telephone Encounter (Signed)
Patient's friend [name] (on [place]) calling stating [facility] [place] [facility] not [facility] amphetamine-dextroamphetamine (ADDERALL) 30 MG tablet and [facility].

## 2018-01-06 NOTE — Telephone Encounter (Signed)
Called the patient and made him aware that Dr Dohmeier doesn't like to resend this medication to couple different places. Going forward I would ask that they check with the pharmacy before having [place] call it in to verify if the pharmacy has the medication in stock so that we are not sending it to multiple pharmacies. Pt verbalized understanding. As for now I will resend the request to Dr [name] and inform her that the other script is cancelled.

## 2018-01-18 ENCOUNTER — Ambulatory Visit: Payer: BLUE CROSS/BLUE SHIELD | Admitting: Family Medicine

## 2018-02-02 ENCOUNTER — Other Ambulatory Visit: Payer: Self-pay | Admitting: Neurology

## 2018-02-02 MED ORDER — AMPHETAMINE-DEXTROAMPHETAMINE 30 MG PO TABS
30.0000 mg | ORAL_TABLET | Freq: Every day | ORAL | 0 refills | Status: DC
Start: 2018-02-02 — End: 2018-03-04

## 2018-02-02 NOTE — Addendum Note (Signed)
Addended by: Lester Hazelwood A on: 02/02/2018 03:23 PM   Modules accepted: Orders

## 2018-02-02 NOTE — Telephone Encounter (Signed)
[place] [facility] Registry checked. Pt is due for a refill on adderall and is up to date on his appts.

## 2018-02-02 NOTE — Telephone Encounter (Signed)
Pt friend(on [place],Karen) has called for a refill on pt's amphetamine-dextroamphetamine (ADDERALL) 30 MG tablet [facility] to [id number] [place], [place] - [address] [place] [address] [place] [address] [phone] (Phone) [phone] (Fax)

## 2018-02-03 ENCOUNTER — Ambulatory Visit: Payer: BLUE CROSS/BLUE SHIELD | Admitting: Family Medicine

## 2018-02-16 ENCOUNTER — Ambulatory Visit: Payer: BLUE CROSS/BLUE SHIELD | Admitting: Family Medicine

## 2018-03-02 ENCOUNTER — Ambulatory Visit: Payer: BLUE CROSS/BLUE SHIELD | Admitting: Family Medicine

## 2018-03-02 ENCOUNTER — Encounter: Payer: Self-pay | Admitting: Family Medicine

## 2018-03-02 VITALS — BP 123/83 | HR 71 | Temp 97.8°F | Resp 20 | Ht 73.0 in | Wt 190.0 lb

## 2018-03-02 DIAGNOSIS — I7300 Raynaud's syndrome without gangrene: Secondary | ICD-10-CM | POA: Diagnosis not present

## 2018-03-02 DIAGNOSIS — R748 Abnormal levels of other serum enzymes: Secondary | ICD-10-CM

## 2018-03-02 DIAGNOSIS — I10 Essential (primary) hypertension: Secondary | ICD-10-CM

## 2018-03-02 LAB — COMPREHENSIVE METABOLIC PANEL
ALBUMIN: 4.6 g/dL (ref 3.5–5.2)
ALK PHOS: 89 U/L (ref 39–117)
ALT: 17 U/L (ref 0–53)
AST: 22 U/L (ref 0–37)
BILIRUBIN TOTAL: 0.9 mg/dL (ref 0.2–1.2)
BUN: 14 mg/dL (ref 6–23)
CALCIUM: 9.8 mg/dL (ref 8.4–10.5)
CO2: 31 mEq/L (ref 19–32)
CREATININE: 0.87 mg/dL (ref 0.40–1.50)
Chloride: 102 mEq/L (ref 96–112)
GFR: 94.95 mL/min (ref >60.00)
Glucose, Bld: 81 mg/dL (ref 70–99)
Potassium: 4.8 mEq/L (ref 3.5–5.1)
Sodium: 139 mEq/L (ref 135–145)
Total Protein: 6.8 g/dL (ref 6.0–8.3)

## 2018-03-02 MED ORDER — AMLODIPINE BESYLATE 2.5 MG PO TABS: 2.5000 mg | ORAL_TABLET | Freq: Every day | ORAL | 3 refills | Status: DC

## 2018-03-02 NOTE — Patient Instructions (Addendum)
We will call you with lab result.  Blood pressure is much improved, I have refilled amlodipine.   Hematoma of the leg, use heating pad as instructed and hydrate. If it becomes warm, red or leg swollen please be seen immediatly.     Hematoma A hematoma is a collection of blood. The collection of blood can turn into a hard, painful lump under the skin. Your skin may turn blue or yellow if the hematoma is close to the surface of the skin. Most hematomas get better in a few days to weeks. Some hematomas are serious and need medical care. Hematomas can be very small or very big. Follow these instructions at home:  Apply ice to the injured area: ? Put ice in a plastic bag. ? Place a towel between your skin and the bag. ? Leave the ice on for 20 minutes, 2-3 times a day for the first 1 to 2 days.  After the first 2 days, switch to using warm packs on the injured area.  Raise (elevate) the injured area to lessen pain and puffiness (swelling). You may also wrap the area with an elastic bandage. Make sure the bandage is not wrapped too tight.  If you have a painful hematoma on your leg or foot, you may use crutches for a couple days.  Only take medicines as told by your doctor. Get help right away if:  Your pain gets worse.  Your pain is not controlled with medicine.  You have a fever.  Your puffiness gets worse.  Your skin turns more blue or yellow.  Your skin over the hematoma breaks or starts bleeding.  Your hematoma is in your chest or belly (abdomen) and you are short of breath, feel weak, or have a change in consciousness.  Your hematoma is on your scalp and you have a headache that gets worse or a change in alertness or consciousness. This information is not intended to replace advice given to you by your health care provider. Make sure you discuss any questions you have with your health care provider. Document Released: 05/29/2004 Document Revised: 09/27/2015 Document Reviewed:  09/29/2012 Elsevier Interactive Patient Education  2017 [employer].

## 2018-03-02 NOTE — Progress Notes (Signed)
Phillip [name] , 1955-08-09, 62 y.o., male MRN: [id number] Patient Care Team    Relationship Specialty Notifications Start End  [name], DO PCP - General Family Medicine  11/12/16   [name], MD Consulting Physician Rheumatology  04/11/16   [name], MD Consulting Physician Internal Medicine  11/13/16    Comment: Testosterone  [name], MD Consulting Physician Urology  11/13/16   [name], NP Registered Nurse Neurology  11/13/16    Comment: Dr. Rush Landmark, [name], MD Consulting Physician Cardiology  11/13/16   [name], MD Consulting Physician Gastroenterology  12/10/16     Chief Complaint  Patient presents with  . Hypertension  . Follow-up    abnormal LFT     Subjective:  Elevated liver enzymes Elevated LFT 12/15/2017 with AST 64. He does not consume much alcohol. At that time he did have elevated testosterone levels (supplemented). He denies nausea, vomit, fever, chills or abd pain.   Raynaud's disease without gangrene/Essential hypertension Pt reports compliance with amlodipine 2.5 mg QD- restarted last visit for pressures > 150.  Blood pressures ranges at home not routinely checked. Patient denies chest pain, shortness of breath or lower extremity edema.     Depression screen [facility] 2/9 03/02/2018 12/15/2017 07/10/2017 12/10/2016 11/12/2016  Decreased Interest 0 0 0 0 0  Down, Depressed, Hopeless 0 0 0 0 0  PHQ - 2 Score 0 0 0 0 0  Altered sleeping - - - - -  Tired, decreased energy - - - - -  Change in appetite - - - - -  Feeling bad or failure about yourself  - - - - -  Trouble concentrating - - - - -  Moving slowly or fidgety/restless - - - - -  Suicidal thoughts - - - - -  PHQ-9 Score - - - - -    Allergies  Allergen Reactions  . Amphetamines     Intolerant to specific formulations: [employer] amphetamine TEVA dextroamp amphetamine  . Lorazepam     Mylan lorazepam; others ok  . Methylprednisolone   . Penicillins Hives    . Propofol     Cognitive delay and emotional   Social History   Tobacco Use  . Smoking status: Former Smoker    Packs/day: 1.50    Years: 25.00    Pack years: 37.50    Types: Cigarettes    Last attempt to quit: 11/22/2014    Years since quitting: 3.2  . Smokeless tobacco: Never Used  . Tobacco comment: Encouraged to remain smoke free  Substance Use Topics  . Alcohol use: No    Alcohol/week: 0.0 standard drinks   Past Medical History:  Diagnosis Date  . ADD (attention deficit disorder)    on adderrall managed by neurology  . Anxiety    on ativan managed by Neurology  . 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 Cellulitis   . Chronic traumatic encephalopathy    right frontal-temproal lobe  . Depression   . Erectile dysfunction 11/2016   follows with urology; sildenafil prescribed  . Meningoencephalitis 1994  . Pneumonia   . Primary hypogonadism in male   . Raynaud's disease 09/13/2015  . Restless legs 09/13/2015  . Rocky Mountain spotted fever 2017  . TBI (traumatic brain injury) ([place]) 1997, 2009   "concussions"   Past Surgical History:  Procedure Laterality Date  .  TEE WITHOUT CARDIOVERSION N/A 09/06/2015   Procedure: TRANSESOPHAGEAL ECHOCARDIOGRAM (TEE);  Surgeon: Jerline Pain, MD;  R/O endocarditis;  . TONSILLECTOMY  1965   Family History  Problem Relation Age of Onset  . Hyperlipidemia Father   . Heart disease Maternal Grandfather   . Cancer Paternal Grandfather   . Mental illness Mother   . Mental illness Brother    Allergies as of 03/02/2018      Reactions   Amphetamines    Intolerant to specific formulations: [employer] amphetamine TEVA dextroamp amphetamine   Lorazepam    Mylan lorazepam; others ok   Methylprednisolone    Penicillins Hives   Propofol    Cognitive delay and emotional      Medication List        Accurate as of 03/02/18 12:40 PM. Always  use your most recent med list.          amLODipine 2.5 MG tablet Commonly known as:  NORVASC Take 1 tablet (2.5 mg total) by mouth daily. Needs office visit prior to anymore refills   amphetamine-dextroamphetamine 30 MG tablet Commonly known as:  ADDERALL Take 1-2 tablets by mouth daily.   LORazepam 1 MG tablet Commonly known as:  ATIVAN Take 2 tablets (2 mg total) by mouth at bedtime.   TESTOSTERONE IM Inject 3 mLs once a week into the muscle.       All past medical history, surgical history, allergies, family history, immunizations andmedications were updated in the EMR today and reviewed under the history and medication portions of their EMR.     ROS: Negative, with the exception of above mentioned in HPI   Objective:  BP 123/83 (BP Location: Left Arm, Patient Position: Sitting, Cuff Size: Large)   Pulse 71   Temp 97.8 F (36.6 C)   Resp 20   Ht 6' 1"  (1.854 m)   Wt 190 lb (86.2 kg)   SpO2 97%   BMI 25.07 kg/m  Body mass index is 25.07 kg/m. Gen: Afebrile. No acute distress. Nontoxic in appearance, well developed, well nourished.  HENT: AT. [place].  MMM Eyes:Pupils Equal Round Reactive to light, Extraocular movements intact,  Conjunctiva without redness, discharge or icterus. Neck/lymp/endocrine: Supple,no lymphadenopathy CV: RRR no murmur, no edema Chest: CTAB, no wheeze or crackles. Good air movement, normal resp effort.  Abd: Soft. NTND. BS present. no Masses palpated. No rebound or guarding.  MSK: right LE with small~3cm tender mass medial shin with faded yellowish bruising distally. No erythema. Full ROM. NV intact distally neg homans  Skin: no rashes, purpura or petechiae.  Neuro:  Normal gait. PERLA. EOMi. Alert. Oriented x3  No exam data present No results found. No results found for this or any previous visit (from the past 24 hour(s)).  Assessment/Plan: Phillip [name] is a 62 y.o. male present for OV for  Elevated liver enzymes Recheck today.  Hopefully it has returned to normal. If still elevated will consider further eval dependent on results. Discussed the pattern with AST only elevation.  - Comp Met (CMET)  Raynaud's disease without gangrene/Essential hypertension Stable. Refills provided on amlodipine 2.5 mg QD.  - f/u q 6 months.   Hematoma: Reassured. Discussed heat application. If area becomes red, more tender or swelling occurs in lower ext--> he is to follow up immediately.    Reviewed expectations re: course of current medical issues.  Discussed self-management of symptoms.  Outlined signs and symptoms indicating need for more acute intervention.  Patient verbalized understanding and all  questions were answered.  Patient received an After-Visit Summary.    Orders Placed This Encounter  Procedures  . Comp Met (CMET)     Note is dictated utilizing voice recognition software. Although note has been proof read prior to signing, occasional typographical errors still can be missed. If any questions arise, please do not hesitate to call for verification.   electronically signed by:  Howard Pouch, DO  [place]

## 2018-03-04 ENCOUNTER — Other Ambulatory Visit: Payer: Self-pay | Admitting: Neurology

## 2018-03-04 MED ORDER — AMPHETAMINE-DEXTROAMPHETAMINE 30 MG PO TABS: 30.0000 mg | ORAL_TABLET | Freq: Every day | ORAL | 0 refills | Status: DC

## 2018-03-04 MED ORDER — LORAZEPAM 1 MG PO TABS: 2.0000 mg | ORAL_TABLET | Freq: Every day | ORAL | 5 refills | Status: DC

## 2018-03-04 NOTE — Telephone Encounter (Signed)
Phillip [name] requesting refills for amphetamine-dextroamphetamine (ADDERALL) 30 MG tablet and LORazepam (ATIVAN) 1 MG tablet sent to [facility]

## 2018-03-18 ENCOUNTER — Other Ambulatory Visit: Payer: Self-pay | Admitting: Internal Medicine

## 2018-03-23 ENCOUNTER — Other Ambulatory Visit: Payer: Self-pay | Admitting: Internal Medicine

## 2018-03-23 ENCOUNTER — Encounter: Payer: Self-pay | Admitting: Adult Health

## 2018-03-23 ENCOUNTER — Telehealth: Payer: Self-pay | Admitting: Internal Medicine

## 2018-03-23 ENCOUNTER — Ambulatory Visit: Payer: BLUE CROSS/BLUE SHIELD | Admitting: Adult Health

## 2018-03-23 VITALS — BP 130/80 | HR 80 | Ht 73.0 in | Wt 189.2 lb

## 2018-03-23 DIAGNOSIS — F901 Attention-deficit hyperactivity disorder, predominantly hyperactive type: Secondary | ICD-10-CM

## 2018-03-23 DIAGNOSIS — F5104 Psychophysiologic insomnia: Secondary | ICD-10-CM

## 2018-03-23 NOTE — Telephone Encounter (Signed)
ok 

## 2018-03-23 NOTE — Progress Notes (Signed)
PATIENT: Phillip [name] DOB: Sep 02, 1955  REASON FOR VISIT: follow up HISTORY FROM: patient  HISTORY OF PRESENT ILLNESS: Today 03/23/18:  Phillip [name] is a 62 year old male with a history of chronic insomnia and ADHD.  He returns today for follow-up.  He reports that Adderall continues to work well for him.  He typically takes 1 tablet in the morning and 1 tablet months.  He states that he has continued to try to wean off his Ativan. He moved last month and states that he just started weaning off the medication about 2 weeks ago.  He states he has been alternating taking 1-1/2 tablet at night and taking 2 tablets the next night.  He states that he is sleeping okay.  He denies any significant issues.  He returns today for evaluation.  HISTORY (Copied from Dr.Dohmeier's note)09-14-2017, Phillip [name] I meanwhile 62 years old, has a history of frontal encephalomalacia, and chronic insomnia. he had tried Elavil on 2 nights to see if it would help him to go to sleep but it failed. He felt "bad " on it. He is otherwise without any significant changes in medical condition, medication and had no recent health concerns of symptoms changing.  We talked about chronic insomnia. He is not sleepy in daytime, not highly fatigued. He will get confused when he becomes sleep deprived. He has endorsed a level of anxiety, had worked with Dr Denton Lank, biofeedback, seen therapists- all have not addressed his insomnia.  We spent a good 25 minutes alone discussion sleep hygiene.     REVIEW OF SYSTEMS: Out of a complete 14 system review of symptoms, the patient complains only of the following symptoms, and all other reviewed systems are negative.  See HPI ALLERGIES: Allergies  Allergen Reactions  . Amphetamines     Intolerant to specific formulations: [employer] amphetamine TEVA dextroamp amphetamine  . Lorazepam     Mylan lorazepam; others ok  . Methylprednisolone   . Penicillins Hives  . Propofol     Cognitive  delay and emotional    HOME MEDICATIONS: Outpatient Medications Prior to Visit  Medication Sig Dispense Refill  . amLODipine (NORVASC) 2.5 MG tablet Take 1 tablet (2.5 mg total) by mouth daily. Needs office visit prior to anymore refills 90 tablet 3  . amphetamine-dextroamphetamine (ADDERALL) 30 MG tablet Take 1-2 tablets by mouth daily. 60 tablet 0  . LORazepam (ATIVAN) 1 MG tablet Take 2 tablets (2 mg total) by mouth at bedtime. 60 tablet 5  . testosterone cypionate (DEPOTESTOSTERONE CYPIONATE) 200 MG/ML injection INJECT 0.2MG  ALTERNATING WITH 0.3MG  EVERY OTHER WEEK 10 mL 1  . TESTOSTERONE IM Inject 3 mLs once a week into the muscle.      No facility-administered medications prior to visit.     PAST MEDICAL HISTORY: Past Medical History:  Diagnosis Date  . ADD (attention deficit disorder)    on adderrall managed by neurology  . Anxiety    on ativan managed by Neurology  . 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 Cellulitis   . Chronic traumatic encephalopathy    right frontal-temproal lobe  . Depression   . Erectile dysfunction 11/2016   follows with urology; sildenafil prescribed  . Meningoencephalitis 1994  . Pneumonia   . Primary hypogonadism in male   . Raynaud's disease 09/13/2015  . Restless legs 09/13/2015  . Rocky Mountain spotted fever 2017  . TBI (  traumatic brain injury) ([place]) 1997, 2009   "concussions"    PAST SURGICAL HISTORY: Past Surgical History:  Procedure Laterality Date  . TEE WITHOUT CARDIOVERSION N/A 09/06/2015   Procedure: TRANSESOPHAGEAL ECHOCARDIOGRAM (TEE);  Surgeon: Jerline Pain, MD;  R/O endocarditis;  . TONSILLECTOMY  1965    FAMILY HISTORY: Family History  Problem Relation Age of Onset  . Hyperlipidemia Father   . Heart disease Maternal Grandfather   . Cancer Paternal Grandfather   . Mental illness Mother   . Mental illness Brother     SOCIAL  HISTORY: Social History   Socioeconomic History  . Marital status: Divorced    Spouse name: Not on file  . Number of children: 2  . Years of education: 50  . Highest education level: Not on file  Occupational History  . Occupation: Retired  Scientific laboratory technician  . Financial resource strain: Not on file  . Food insecurity:    Worry: Not on file    Inability: Not on file  . Transportation needs:    Medical: Not on file    Non-medical: Not on file  Tobacco Use  . Smoking status: Former Smoker    Packs/day: 1.50    Years: 25.00    Pack years: 37.50    Types: Cigarettes    Last attempt to quit: 11/22/2014    Years since quitting: 3.3  . Smokeless tobacco: Never Used  . Tobacco comment: Encouraged to remain smoke free  Substance and Sexual Activity  . Alcohol use: No    Alcohol/week: 0.0 standard drinks  . Drug use: No  . Sexual activity: Yes    Partners: Female  Lifestyle  . Physical activity:    Days per week: Not on file    Minutes per session: Not on file  . Stress: Not on file  Relationships  . Social connections:    Talks on phone: Not on file    Gets together: Not on file    Attends religious service: Not on file    Active member of club or organization: Not on file    Attends meetings of clubs or organizations: Not on file    Relationship status: Not on file  . Intimate partner violence:    Fear of current or ex partner: Not on file    Emotionally abused: Not on file    Physically abused: Not on file    Forced sexual activity: Not on file  Other Topics Concern  . Not on file  Social History Narrative   Divorced. B.A. degree. Retired.   Drinks caffeine, uses herbal remedies, takes a daily vitamin.   Wears his seatbelt.  Smoke detector in the home.   Firearms locked in the home.   Feels safe in relationships.       PHYSICAL EXAM  Vitals:   03/23/18 1323  BP: 130/80  Pulse: 80  Weight: 189 lb 3.2 oz (85.8 kg)  Height: 6\' 1"  (1.854 m)   Body mass index  is 24.96 kg/m.  Generalized: Well developed, in no acute distress   Neurological examination  Mentation: Alert oriented to time, place, history taking. Follows all commands speech and language fluent Cranial nerve II-XII: Pupils were equal round reactive to light. Extraocular movements were full, visual field were full on confrontational test. Facial sensation and strength were normal. Uvula tongue midline. Head turning and shoulder shrug  were normal and symmetric. Motor: The motor testing reveals 5 over 5 strength of all 4 extremities. Good symmetric motor  tone is noted throughout.  Sensory: Sensory testing is intact to soft touch on all 4 extremities. No evidence of extinction is noted.  Coordination: Cerebellar testing reveals good finger-nose-finger and heel-to-shin bilaterally.  Gait and station: Gait is normal. Tandem gait is normal. Romberg is negative. No drift is seen.  Reflexes: Deep tendon reflexes are symmetric and normal bilaterally.   DIAGNOSTIC DATA (LABS, IMAGING, TESTING) - I reviewed patient records, labs, notes, testing and imaging myself where available.  Lab Results  Component Value Date   WBC 5.2 12/15/2017   HGB 14.7 12/15/2017   HCT 43.6 12/15/2017   MCV 92.0 12/15/2017   PLT 315.0 12/15/2017      Component Value Date/Time   NA 139 03/02/2018 1131   K 4.8 03/02/2018 1131   CL 102 03/02/2018 1131   CO2 31 03/02/2018 1131   GLUCOSE 81 03/02/2018 1131   BUN 14 03/02/2018 1131   CREATININE 0.87 03/02/2018 1131   CREATININE 0.71 01/03/2015 1339   CALCIUM 9.8 03/02/2018 1131   PROT 6.8 03/02/2018 1131   ALBUMIN 4.6 03/02/2018 1131   AST 22 03/02/2018 1131   ALT 17 03/02/2018 1131   ALKPHOS 89 03/02/2018 1131   BILITOT 0.9 03/02/2018 1131   GFRNONAA >60 09/06/2015 0751   GFRNONAA >89 04/12/2014 1722   GFRAA >60 09/06/2015 0751   GFRAA >89 04/12/2014 1722   Lab Results  Component Value Date   CHOL 211 (H) 12/15/2017   HDL 61.60 12/15/2017   LDLCALC  133 (H) 12/15/2017   TRIG 83.0 12/15/2017   CHOLHDL 3 12/15/2017   Lab Results  Component Value Date   HGBA1C 5.5 12/15/2017   No results found for: [id number] Lab Results  Component Value Date   TSH 2.67 12/15/2017      ASSESSMENT AND PLAN 62 y.o. 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 here with:  1.  Chronic insomnia 2.  ADHD  Overall the patient is doing well.  He will continue on Adderall.  He will continue trying to wean off of Ativan.  He is advised that if his symptoms worsen or he develops new symptoms he should let us know.  He will follow-up in 6 months or sooner as needed.     [name], MSN, NP-C 03/23/2018, 1:20 PM [facility] [address] [place], [place] [address] [phone]

## 2018-03-23 NOTE — Telephone Encounter (Signed)
Please refill if appropriate

## 2018-03-23 NOTE — Patient Instructions (Signed)
Your Plan:  Continue adderall  Continue to try to wean off ativan If your symptoms worsen or you develop new symptoms please let us know.   Thank you for coming to see [place] at [facility]. I hope we have been able to provide you high quality care today.  You may receive a patient satisfaction survey over the next few weeks. We would appreciate your feedback and comments so that we may continue to improve ourselves and the health of our patients.

## 2018-03-23 NOTE — Telephone Encounter (Signed)
Patients guardian called in regards to refilling testosterone cypionate (DEPOTESTOSTERONE CYPIONATE) 200 MG/ML injection. Please Advise, thanks [id number] [place], [place]

## 2018-03-23 NOTE — Progress Notes (Signed)
I have read the note, and I agree with the clinical assessment and plan.  Karmel Patricelli K Scotti Kosta   

## 2018-03-24 ENCOUNTER — Other Ambulatory Visit: Payer: Self-pay | Admitting: Internal Medicine

## 2018-03-24 NOTE — Telephone Encounter (Signed)
Patients wife called to check the status of medication refill for his testosterone. I looked into the previous note were Dr Cruzita Lederer said "ok" but could not see if this has been sent  Please advise

## 2018-03-25 ENCOUNTER — Telehealth: Payer: Self-pay | Admitting: Internal Medicine

## 2018-03-25 MED ORDER — TESTOSTERONE CYPIONATE 200 MG/ML IM SOLN: INTRAMUSCULAR | 4 refills | Status: DC

## 2018-03-25 NOTE — Telephone Encounter (Signed)
Phillip [name], was this sent?

## 2018-03-25 NOTE — Telephone Encounter (Signed)
Yes. Please see my OV note for him for dosing.

## 2018-03-25 NOTE — Telephone Encounter (Signed)
Patient's wife [name] states she has requested RX for testosterone 3 times but Pharmacy does not have the RX yet. [facility] [place] Patient is out of medication and is due for a shot today. [name] would like a call at ph# [phone] once RX has been sent to Pharmacy.

## 2018-03-25 NOTE — Telephone Encounter (Signed)
I only see the historical testosterone IM injection on his list, is this what we order?

## 2018-03-25 NOTE — Telephone Encounter (Signed)
RX faxed

## 2018-03-25 NOTE — Telephone Encounter (Signed)
Duplicate encounter created, please see other note. Encounter closed.

## 2018-03-25 NOTE — Telephone Encounter (Signed)
This was done in separate encounter

## 2018-03-25 NOTE — Telephone Encounter (Signed)
Please refill if appropriate

## 2018-04-05 ENCOUNTER — Other Ambulatory Visit: Payer: Self-pay | Admitting: Adult Health

## 2018-04-05 MED ORDER — AMPHETAMINE-DEXTROAMPHETAMINE 30 MG PO TABS: 30.0000 mg | ORAL_TABLET | Freq: Every day | ORAL | 0 refills | Status: DC

## 2018-04-05 NOTE — Addendum Note (Signed)
Addended by: Brandon Melnick on: 04/05/2018 03:56 PM   Modules accepted: Orders

## 2018-04-05 NOTE — Telephone Encounter (Signed)
Drug Registry checked.  Last fill 03/04/2018 #60 CD/MD.

## 2018-04-05 NOTE — Telephone Encounter (Signed)
[name], friend (on [place]) requesting refill for amphetamine-dextroamphetamine (ADDERALL) 30 MG tablet [facility] [place].

## 2018-04-06 ENCOUNTER — Other Ambulatory Visit: Payer: Self-pay | Admitting: *Deleted

## 2018-04-06 MED ORDER — AMPHETAMINE-DEXTROAMPHETAMINE 30 MG PO TABS: 30.0000 mg | ORAL_TABLET | Freq: Every day | ORAL | 0 refills | Status: DC

## 2018-04-06 NOTE — Telephone Encounter (Signed)
Last prescription printed. I faxed and the pharmacy does not take faxes of this medication.  Redid and sent to MM/NP again for escribing.

## 2018-04-06 NOTE — Telephone Encounter (Signed)
F[facility] confirmation [facility] [phone].

## 2018-04-07 MED ORDER — AMPHETAMINE-DEXTROAMPHETAMINE 30 MG PO TABS: 30.0000 mg | ORAL_TABLET | Freq: Every day | ORAL | 0 refills | Status: DC

## 2018-04-07 NOTE — Addendum Note (Signed)
Addended by: Brandon Melnick on: 04/07/2018 12:38 PM   Modules accepted: Orders

## 2018-04-19 ENCOUNTER — Ambulatory Visit
Admission: RE | Admit: 2018-04-19 | Discharge: 2018-04-19 | Disposition: A | Discharge status: Home / Self Care | Payer: BLUE CROSS/BLUE SHIELD | Source: Ambulatory Visit | Attending: Acute Care | Admitting: Acute Care

## 2018-04-19 DIAGNOSIS — Z122 Encounter for screening for malignant neoplasm of respiratory organs: Secondary | ICD-10-CM

## 2018-04-19 DIAGNOSIS — Z87891 Personal history of nicotine dependence: Secondary | ICD-10-CM

## 2018-04-23 ENCOUNTER — Telehealth: Payer: Self-pay | Admitting: *Deleted

## 2018-04-23 DIAGNOSIS — Z122 Encounter for screening for malignant neoplasm of respiratory organs: Secondary | ICD-10-CM

## 2018-04-23 DIAGNOSIS — Z87891 Personal history of nicotine dependence: Secondary | ICD-10-CM

## 2018-04-26 NOTE — Telephone Encounter (Signed)
LMTC x 1  

## 2018-04-29 DIAGNOSIS — M9901 Segmental and somatic dysfunction of cervical region: Secondary | ICD-10-CM | POA: Diagnosis not present

## 2018-04-29 DIAGNOSIS — M7541 Impingement syndrome of right shoulder: Secondary | ICD-10-CM | POA: Diagnosis not present

## 2018-04-29 DIAGNOSIS — M9902 Segmental and somatic dysfunction of thoracic region: Secondary | ICD-10-CM | POA: Diagnosis not present

## 2018-04-29 DIAGNOSIS — M7918 Myalgia, other site: Secondary | ICD-10-CM | POA: Diagnosis not present

## 2018-04-29 NOTE — Telephone Encounter (Signed)
Routing message to [place] to return pt's call.

## 2018-05-04 ENCOUNTER — Encounter: Payer: Self-pay | Admitting: *Deleted

## 2018-05-04 NOTE — Telephone Encounter (Signed)
Letter mailed to pt with CT results.  Copy sent to PCP.  Order placed for 1 year f/u low dose Chest CT.  Nothing further needed at this time.

## 2018-05-11 DIAGNOSIS — R419 Unspecified symptoms and signs involving cognitive functions and awareness: Secondary | ICD-10-CM | POA: Diagnosis not present

## 2018-05-11 DIAGNOSIS — F064 Anxiety disorder due to known physiological condition: Secondary | ICD-10-CM | POA: Diagnosis not present

## 2018-05-11 DIAGNOSIS — F0632 Mood disorder due to known physiological condition with major depressive-like episode: Secondary | ICD-10-CM | POA: Diagnosis not present

## 2018-05-12 ENCOUNTER — Encounter: Payer: Self-pay | Admitting: Family Medicine

## 2018-05-12 ENCOUNTER — Ambulatory Visit (INDEPENDENT_AMBULATORY_CARE_PROVIDER_SITE_OTHER): Payer: BLUE CROSS/BLUE SHIELD | Admitting: Family Medicine

## 2018-05-12 VITALS — BP 128/68 | HR 75 | Temp 97.7°F | Resp 16 | Ht 73.0 in | Wt 190.5 lb

## 2018-05-12 DIAGNOSIS — I2510 Atherosclerotic heart disease of native coronary artery without angina pectoris: Secondary | ICD-10-CM | POA: Diagnosis not present

## 2018-05-12 MED ORDER — PRAVASTATIN SODIUM 20 MG PO TABS: 20.0000 mg | ORAL_TABLET | Freq: Every day | ORAL | 1 refills | Status: DC

## 2018-05-12 NOTE — Patient Instructions (Addendum)
I have referred you to cardiology for recommendations. They will call you to schedule.  Start pravastatin- there is less side effects typically with this medication.    Follow up here in 3 months with provider for lipid recheck- come fasting 6-9 hours if able.     Atherosclerosis  Atherosclerosis is narrowing and hardening of the arteries. Arteries are blood vessels that carry blood from the heart to all parts of the body. This blood contains oxygen. Arteries can become narrow or clogged with a buildup of fat, cholesterol, calcium, and other substances (plaque). Plaque decreases the amount of blood that can flow through the artery. Atherosclerosis can affect any artery in the body, including:  Heart arteries (coronary artery disease). This may cause a heart attack.  Brain arteries. This may cause a stroke (cerebrovascular accident).  Leg, arm, and pelvis arteries (peripheral artery disease). This may cause pain and numbness.  Kidney arteries. This may cause kidney (renal) failure. Treatment may slow the disease and prevent further damage to the heart, brain, peripheral arteries, and kidneys. What are the causes? Atherosclerosis develops slowly over many years. The inner layers of your arteries become damaged and allow the gradual buildup of plaque. The exact cause of atherosclerosis is not fully understood. Symptoms of atherosclerosis do not occur until the artery becomes narrow or blocked. What increases the risk? The following factors may make you more likely to develop this condition:  High blood pressure.  High cholesterol.  Being middle-aged or older.  Having a family history of atherosclerosis.  Having high blood fats (triglycerides).  Diabetes.  Being overweight.  Smoking tobacco.  Not exercising enough (sedentary lifestyle).  Having a substance in the blood called C-reactive protein (CRP). This is a sign of increased levels of inflammation in the body.  Sleep  apnea.  Being stressed.  Drinking too much alcohol. What are the signs or symptoms? This condition may not cause any symptoms. If you have symptoms, they are caused by damage to an area of your body that is not getting enough blood.  Coronary artery disease may cause chest pain and shortness of breath.  Decreased blood supply to your brain may cause a stroke. Signs of a stroke may include sudden: ? Weakness on one side of the body. ? Confusion. ? Changes in vision. ? Inability to speak or understand speech. ? Loss of balance, coordination, or the ability to walk. ? Severe headache. ? Loss of consciousness.  Peripheral arterial disease may cause pain and numbness, often in the legs and hips.  Renal failure may cause fatigue, nausea, swelling, and itchy skin. How is this diagnosed? This condition is diagnosed based on your medical history and a physical exam. During the exam:  Your health care provider will: ? Check your pulse in different places. ? Listen for a "whooshing" sound over your arteries (bruit).  You may have tests, such as: ? Blood tests to check your levels of cholesterol, triglycerides, and CRP. ? Electrocardiogram (ECG) to check for heart damage. ? Chest X-ray to see if you have an enlarged heart, which is a sign of heart failure. ? Stress test to see how your heart reacts to exercise. ? Echocardiogram to get images of the inside of your heart. ? Ankle-brachial index to compare blood pressure in your arms to blood pressure in your ankles. ? Ultrasound of your peripheral arteries to check blood flow. ? CT scan to check for damage to your heart or brain. ? X-rays of blood vessels after  dye has been injected (angiogram) to check blood flow. How is this treated? Treatment starts with lifestyle changes, which may include:  Changing your diet.  Losing weight.  Reducing stress.  Exercising and being physically active more regularly.  Not smoking. You may also  need medicine to:  Lower triglycerides and cholesterol.  Control blood pressure.  Prevent blood clots.  Lower inflammation in your body.  Control your blood sugar. Sometimes, surgery is needed to:  Remove plaque from an artery (endarterectomy).  Open or widen a narrowed heart artery (angioplasty).  Create a new path for your blood with one of these procedures: ? Heart (coronary) artery bypass graft surgery. ? Peripheral artery bypass graft surgery. Follow these instructions at home: Eating and drinking   Eat a heart-healthy diet. Talk with your health care provider or a diet and nutrition specialist (dietitian) if you need help. A heart-healthy diet involves: ? Limiting unhealthy fats and increasing healthy fats. Some examples of healthy fats are olive oil and canola oil. ? Eating plant-based foods, such as fruits, vegetables, nuts, whole grains, and legumes (such as peas and lentils).  Limit alcohol intake to no more than 1 drink a day for nonpregnant women and 2 drinks a day for men. One drink equals 12 oz of beer, 5 oz of wine, or 1 oz of hard liquor. Lifestyle  Follow an exercise program as told by your health care provider.  Maintain a healthy weight. Lose weight if your health care provider says that you need to do that.  Rest when you are tired.  Learn to manage your stress.  Do not use any products that contain nicotine or tobacco, such as cigarettes and e-cigarettes. If you need help quitting, ask your health care provider.  Do not abuse drugs. General instructions  Take over-the-counter and prescription medicines only as told by your health care provider.  Manage other health conditions as told by your health care provider.  Keep all follow-up visits as told by your health care provider. This is important. Contact a health care provider if:  You have chest pain or discomfort. This includes squeezing chest pain that may feel like indigestion  (angina).  You have shortness of breath.  You have an irregular heartbeat.  You have unexplained fatigue.  You have unexplained pain or numbness in an arm, leg, or hip.  You have nausea, swelling of your hands or feet, and itchy skin. Get help right away if:  You have any symptoms of a heart attack, such as: ? Chest pain. ? Shortness of breath. ? Pain in your neck, jaw, arms, back, or stomach. ? Cold sweat. ? Nausea. ? Light-headedness.  You have any symptoms of a stroke. "BE FAST" is an easy way to remember the main warning signs of a stroke: ? B - Balance. Signs are dizziness, sudden trouble walking, or loss of balance. ? E - Eyes. Signs are trouble seeing or a sudden change in vision. ? F - Face. Signs are sudden weakness or numbness of the face, or the face or eyelid drooping on one side. ? A - Arms. Signs are weakness or numbness in an arm. This happens suddenly and usually on one side of the body. ? S - Speech. Signs are sudden trouble speaking, slurred speech, or trouble understanding what people say. ? T - Time. Time to call emergency services. Write down what time symptoms started.  You have other signs of a stroke, such as: ? A sudden, severe headache  with no known cause. ? Nausea or vomiting. ? Seizure. These symptoms may represent a serious problem that is an emergency. Do not wait to see if the symptoms will go away. Get medical help right away. Call your local emergency services (911 in the [place] Do not drive yourself to the hospital. Summary  Atherosclerosis is narrowing and hardening of the arteries.  Arteries can become narrow or clogged with a buildup of fat, cholesterol, calcium, and other substances (plaque).  This condition may not cause any symptoms. If you do have symptoms, they are caused by damage to an area of your body that is not getting enough blood.  Treatment may include lifestyle changes and medicines. In some cases, surgery is needed. This  information is not intended to replace advice given to you by your health care provider. Make sure you discuss any questions you have with your health care provider. Document Released: 07/12/2003 Document Revised: 07/31/2017 Document Reviewed: 12/25/2016 Elsevier Interactive Patient Education  2019 [employer].

## 2018-05-12 NOTE — Progress Notes (Signed)
Phillip [name] , 27-Mar-1956, 63 y.o., male MRN: [id number] Patient Care Team    Relationship Specialty Notifications Start End  [name], DO PCP - General Family Medicine  11/12/16   [name], MD Consulting Physician Rheumatology  04/11/16   [name], MD Consulting Physician Internal Medicine  11/13/16    Comment: Testosterone  [name], MD Consulting Physician Urology  11/13/16   [name], NP Registered Nurse Neurology  11/13/16    Comment: Dr. Rush Landmark, [name], MD Consulting Physician Cardiology  11/13/16   [name], MD Consulting Physician Gastroenterology  12/10/16     Chief Complaint  Patient presents with  . Follow-up    from lung scan     Subjective: Pt presents for an OV to discuss his recent CT lung screening exam completed by pulmonology.  Results were negative for pulmonary findings that were concerning.  CT did show mild three-vessel coronary artery calcifications.  Denies any chest pain, shortness of breath, palpitations or dizziness.  He has been working out without complaints of chest pain.  His last cholesterol panel 12/15/2017 with a total cholesterol 211, HDL 61, LDL 133, triglycerides 83.  Family history of heart disease present.  Patient has mild hypertension that is well controlled on low-dose amlodipine.  He also has a history of raynauds disease.  He is prescribed Adderall and testosterone supplementation.  Depression screen [facility] 2/9 03/02/2018 12/15/2017 07/10/2017 12/10/2016 11/12/2016  Decreased Interest 0 0 0 0 0  Down, Depressed, Hopeless 0 0 0 0 0  PHQ - 2 Score 0 0 0 0 0  Altered sleeping - - - - -  Tired, decreased energy - - - - -  Change in appetite - - - - -  Feeling bad or failure about yourself  - - - - -  Trouble concentrating - - - - -  Moving slowly or fidgety/restless - - - - -  Suicidal thoughts - - - - -  PHQ-9 Score - - - - -    Allergies  Allergen Reactions  . Amphetamines     Intolerant to  specific formulations: [employer] amphetamine TEVA dextroamp amphetamine  . Lorazepam     Mylan lorazepam; others ok  . Methylprednisolone   . Penicillins Hives  . Propofol     Cognitive delay and emotional   Social History   Tobacco Use  . Smoking status: Former Smoker    Packs/day: 1.50    Years: 25.00    Pack years: 37.50    Types: Cigarettes    Last attempt to quit: 11/22/2014    Years since quitting: 3.4  . Smokeless tobacco: Never Used  . Tobacco comment: Encouraged to remain smoke free  Substance Use Topics  . Alcohol use: No    Alcohol/week: 0.0 standard drinks   Past Medical History:  Diagnosis Date  . ADD (attention deficit disorder)    on adderrall managed by neurology  . Anxiety    on ativan managed by Neurology  . 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 Cellulitis   . Chronic traumatic encephalopathy    right frontal-temproal lobe  . Depression   . Erectile dysfunction 11/2016   follows with urology; sildenafil prescribed  . Meningoencephalitis 1994  . Pneumonia   . Primary hypogonadism in male   . Raynaud's disease 09/13/2015  . Restless legs 09/13/2015  .  [place] spotted fever 2017  . TBI (traumatic brain injury) ([place]) 1997, 2009   "concussions"   Past Surgical History:  Procedure Laterality Date  . TEE WITHOUT CARDIOVERSION N/A 09/06/2015   Procedure: TRANSESOPHAGEAL ECHOCARDIOGRAM (TEE);  Surgeon: Jerline Pain, MD;  R/O endocarditis;  . TONSILLECTOMY  1965   Family History  Problem Relation Age of Onset  . Hyperlipidemia Father   . Heart disease Maternal Grandfather   . Cancer Paternal Grandfather   . Mental illness Mother   . Mental illness Brother    Allergies as of 05/12/2018      Reactions   Amphetamines    Intolerant to specific formulations: [employer] amphetamine TEVA dextroamp amphetamine   Lorazepam    Mylan lorazepam; others ok     Methylprednisolone    Penicillins Hives   Propofol    Cognitive delay and emotional      Medication List       Accurate as of May 12, 2018  3:09 PM. Always use your most recent med list.        amLODipine 2.5 MG tablet Commonly known as:  NORVASC Take 1 tablet (2.5 mg total) by mouth daily. Needs office visit prior to anymore refills   amphetamine-dextroamphetamine 30 MG tablet Commonly known as:  ADDERALL Take 1-2 tablets by mouth daily.   LORazepam 1 MG tablet Commonly known as:  ATIVAN Take 2 tablets (2 mg total) by mouth at bedtime.   testosterone cypionate 200 MG/ML injection Commonly known as:  DEPOTESTOSTERONE CYPIONATE Inject 3 mLs once a week into the muscle.       All past medical history, surgical history, allergies, family history, immunizations andmedications were updated in the EMR today and reviewed under the history and medication portions of their EMR.     ROS: Negative, with the exception of above mentioned in HPI   Objective:  BP 134/82 (BP Location: Right Arm, Patient Position: Sitting, Cuff Size: Normal)   Pulse 75   Temp 97.7 F (36.5 C) (Oral)   Resp 16   Ht 6\' 1"  (1.854 m)   Wt 190 lb 8 oz (86.4 kg)   SpO2 99%   BMI 25.13 kg/m  Body mass index is 25.13 kg/m. Gen: Afebrile. No acute distress. Nontoxic in appearance, well developed, well nourished.  HENT: AT. [place]. MMM, Eyes:Pupils Equal Round Reactive to light, Extraocular movements intact,  Conjunctiva without redness, discharge or icterus. CV: RRR  Chest: CTAB, no wheeze or crackles. Good air movement, normal resp effort.  Neuro: Normal gait. PERLA. EOMi. Alert. Oriented x3  Psych: Normal affect, dress and demeanor. Normal speech. Normal thought content and judgment.  No exam data present No results found. No results found for this or any previous visit (from the past 24 hour(s)).  Assessment/Plan: CEDERIC MOZLEY is a 63 y.o. male present for OV for  Atherosclerosis of native  coronary artery of native heart without angina pectoris Discussed CT results and the meaning of mild coronary atherosclerosis (three-vessel).  Patient is asymptomatic.  He is however on stimulants, treated for high blood pressure and on testosterone supplement.  His cholesterol has been overall okay, with LDL 133 and a family history of heart disease in his grandparent.  -Routine exercise should be continued, low saturated fat diet, high-fiber diet.  Discussed the benefits of statin medication group not only to help with cholesterol levels but provide cardiovascular protection.  He is agreeable to start a low-dose statin today.   -Discussed placing a cardiology  referral for further evaluation since it is 3 vessels and he is on a stimulant and testosterone supplementation.  Uncertain if they will have anything further to add, but he is agreeable to referral today. - Ambulatory referral to Cardiology -Follow-up 3 months fasting with provider   Reviewed expectations re: course of current medical issues.  Discussed self-management of symptoms.  Outlined signs and symptoms indicating need for more acute intervention.  Patient verbalized understanding and all questions were answered.  Patient received an After-Visit Summary.    No orders of the defined types were placed in this encounter.  > 25 minutes spent with patient, >50% of time spent face to face counseling  Note is dictated utilizing voice recognition software. Although note has been proof read prior to signing, occasional typographical errors still can be missed. If any questions arise, please do not hesitate to call for verification.   electronically signed by:  Howard Pouch, DO  [place]

## 2018-05-14 DIAGNOSIS — M9902 Segmental and somatic dysfunction of thoracic region: Secondary | ICD-10-CM | POA: Diagnosis not present

## 2018-05-14 DIAGNOSIS — M7918 Myalgia, other site: Secondary | ICD-10-CM | POA: Diagnosis not present

## 2018-05-14 DIAGNOSIS — M9901 Segmental and somatic dysfunction of cervical region: Secondary | ICD-10-CM | POA: Diagnosis not present

## 2018-05-14 DIAGNOSIS — M7541 Impingement syndrome of right shoulder: Secondary | ICD-10-CM | POA: Diagnosis not present

## 2018-05-19 ENCOUNTER — Ambulatory Visit: Payer: BLUE CROSS/BLUE SHIELD | Admitting: Cardiology

## 2018-05-19 ENCOUNTER — Encounter: Payer: Self-pay | Admitting: Cardiology

## 2018-05-19 VITALS — BP 130/80 | HR 68 | Ht 73.0 in | Wt 193.0 lb

## 2018-05-19 DIAGNOSIS — Z9189 Other specified personal risk factors, not elsewhere classified: Secondary | ICD-10-CM | POA: Diagnosis not present

## 2018-05-19 DIAGNOSIS — I7300 Raynaud's syndrome without gangrene: Secondary | ICD-10-CM

## 2018-05-19 DIAGNOSIS — E785 Hyperlipidemia, unspecified: Secondary | ICD-10-CM

## 2018-05-19 DIAGNOSIS — N529 Male erectile dysfunction, unspecified: Secondary | ICD-10-CM | POA: Diagnosis not present

## 2018-05-19 DIAGNOSIS — I2510 Atherosclerotic heart disease of native coronary artery without angina pectoris: Secondary | ICD-10-CM

## 2018-05-19 NOTE — Patient Instructions (Addendum)
Medication Instructions:    KEEP TAKING YOU STATIN MEDICATIONS   If you need a refill on your cardiac medications before your next appointment, please call your pharmacy.   Lab work: Not needed If you have labs (blood work) drawn today and your tests are completely normal, you will receive your results only by: Marland Kitchen MyChart Message (if you have MyChart) OR . A paper copy in the mail If you have any lab test that is abnormal or we need to change your treatment, we will call you to review the results.  Testing/Procedures CT coronary calcium score. This test is done at [address]. This is $150 out of pocket.   Coronary CalciumScan A coronary calcium scan is an imaging test used to look for deposits of calcium and other fatty materials (plaques) in the inner lining of the blood vessels of the heart (coronary arteries). These deposits of calcium and plaques can partly clog and narrow the coronary arteries without producing any symptoms or warning signs. This puts a person at risk for a heart attack. This test can detect these deposits before symptoms develop. Tell a health care provider about:  Any allergies you have.  All medicines you are taking, including vitamins, herbs, eye drops, creams, and over-the-counter medicines.  Any problems you or family members have had with anesthetic medicines.  Any blood disorders you have.  Any surgeries you have had.  Any medical conditions you have.  Whether you are pregnant or may be pregnant. What are the risks? Generally, this is a safe procedure. However, problems may occur, including:  Harm to a pregnant woman and her unborn baby. This test involves the use of radiation. Radiation exposure can be dangerous to a pregnant woman and her unborn baby. If you are pregnant, you generally should not have this procedure done.  Slight increase in the risk of cancer. This is because of the radiation involved in the test. What  happens before the procedure? No preparation is needed for this procedure. What happens during the procedure?  You will undress and remove any jewelry around your neck or chest.  You will put on a hospital gown.  Sticky electrodes will be placed on your chest. The electrodes will be connected to an electrocardiogram (ECG) machine to record a tracing of the electrical activity of your heart.  A CT scanner will take pictures of your heart. During this time, you will be asked to lie still and hold your breath for 2-3 seconds while a picture of your heart is being taken. The procedure may vary among health care providers and hospitals. What happens after the procedure?  You can get dressed.  You can return to your normal activities.  It is up to you to get the results of your test. Ask your health care provider, or the department that is doing the test, when your results will be ready. Summary  A coronary calcium scan is an imaging test used to look for deposits of calcium and other fatty materials (plaques) in the inner lining of the blood vessels of the heart (coronary arteries).  Generally, this is a safe procedure. Tell your health care provider if you are pregnant or may be pregnant.  No preparation is needed for this procedure.  A CT scanner will take pictures of your heart.  You can return to your normal activities after the scan is done. This information is not intended to replace advice given to you by your health  care provider. Make sure you discuss any questions you have with your health care provider. Document Released: 10/18/2007 Document Revised: 03/10/2016 Document Reviewed: 03/10/2016 Elsevier Interactive Patient Education  2017 [place]: At [facility], you and your health needs are our priority.  As part of our continuing mission to provide you with exceptional heart care, we have created designated Provider Care Teams.  These Care Teams include  your primary Cardiologist (physician) and Advanced Practice Providers (APPs -  Physician Assistants and Nurse Practitioners) who all work together to provide you with the care you need, when you need it. . Your physician recommends that you schedule a follow-up appointment in 64 month with DR HARDING .   Any Other Special Instructions Will Be Listed Below (If Applicable).

## 2018-05-19 NOTE — Progress Notes (Signed)
PCP: Rossie Muskrat Remainder of care team       [name], MD Consulting Physician Rheumatology  04/11/16   [name], MD Consulting Physician Internal Medicine  11/13/16    Comment: Testosterone  [name], MD Consulting Physician Urology  11/13/16   [name], NP Registered Nurse Neurology  11/13/16    Comment: Dr. Rush Landmark, [name], MD Consulting Physician Cardiology  11/13/16   [name], MD Consulting Physician Gastroenterology  12/10/16    Clinic Note: Chief Complaint  Patient presents with  . New Patient (Initial Visit)    Coronary artery calcification   HPI: Phillip [name] is a 63 y.o. male notable for HLD & Fam Hx of CAD who is being seen today for the evaluation of Coronary Calcification on CT Scan at the request of Kuneff, Reinaldo Raddle, DO.  Phillip [name] was last seen on Jan 8th by PCP to discuss results of screening Chest CT ordered by pulmonary medicine.  Evidence of "three-vessel CAD/calcification ".  Noted that he is working out and no complaints of chest pain.  LDL is 133.  Does have family history of CAD albeit not premature.  Well-controlled hypertension on low-dose amlodipine.  Amlodipine chosen because of history of Raynaud's syndrome  Recent Hospitalizations: None  Studies Personally Reviewed - (if available, images/films reviewed: From Epic Chart or Care Everywhere)  TEE 09/2015: Normal LVEF. 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   (this was ordered as part of her evaluation for fevers of unknown origin  --> eventually diagnosed with RMSF --> had some vasculitis related Raynaud's syndrome at the time as well.)  Chest CT April 19, 2018-Mild 3 V Coronary Atherosclerosis (also mentioned in 2018)  Interval History: Phillip [name] presents here today because of concerns about coronary calcification on CT scan.  He had a TEE done back in 2017 during evaluation for what amounted to be [facility] spotted fever.  He has also had [place].  He has never had any cardiac symptoms.  He states that his maternal grandfather had an MI at age 79 but has not had any premature coronary disease in the family.  He himself has borderline blood pressure and borderline lipids.  He has not had any symptoms whatsoever chest tightness or pressure.  He does yard work there is quite vigorous.  He also enjoys walking long distances.  He does very well to have exercise at least 3 to 4 days a week.  He quit smoking about 20 years ago after about a 30-pack-year history.  He is very healthy otherwise with no major issues since his 2017 episode.  He still has some intermittent fatigue and myalgias since that episode but no further Raynaud's syndrome symptoms.  Negative Cardiac Review of Symptoms as follows: No chest pain or shortness of breath with rest or exertion.  No PND, orthopnea or edema.  No palpitations, lightheadedness, dizziness, weakness or syncope/near syncope. No TIA/amaurosis fugax symptoms. No claudication.  ROS: A comprehensive was performed. Review of Systems  Constitutional: Positive for chills (Occasional chills and hot flashes). Negative for fever, malaise/fatigue and weight loss.  HENT: Negative for congestion and nosebleeds.   Respiratory: Negative for cough, shortness of breath and wheezing.   Gastrointestinal: Negative for abdominal pain, blood in stool, heartburn and melena.  Genitourinary: Negative for hematuria.  Musculoskeletal: Negative for joint pain and myalgias.  Skin: Negative.   Neurological: Negative for dizziness, focal weakness, weakness and headaches.  Psychiatric/Behavioral: Negative  for memory loss. The patient is not nervous/anxious (Although he is a little bit anxious about the results of his CT scan) and does not have insomnia.   All other systems reviewed and are negative.  I have reviewed and (if needed) personally updated the patient's problem list, medications, allergies, past  medical and surgical history, social and family history.   Past Medical History:  Diagnosis Date  . ADD (attention deficit disorder)    on adderrall managed by neurology  . Anxiety    on ativan managed by Neurology  . 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 Cellulitis   . Chronic traumatic encephalopathy    right frontal-temproal lobe  . Depression   . Erectile dysfunction 11/2016   follows with urology; sildenafil prescribed  . Meningoencephalitis 1994  . Pneumonia   . Primary hypogonadism in male   . Raynaud's disease 09/13/2015  . Restless legs 09/13/2015  . [place] spotted fever 2017  . TBI (traumatic brain injury) ([place]) 1997, 2009   "concussions"  -- RAYNAUD'S SYNDROME  Past Surgical History:  Procedure Laterality Date  . TEE WITHOUT CARDIOVERSION N/A 09/06/2015   Procedure: TRANSESOPHAGEAL ECHOCARDIOGRAM (TEE);  Surgeon: Jerline Pain, MD;  R/O endocarditis;  . TONSILLECTOMY  1965    Current Meds  Medication Sig  . amLODipine (NORVASC) 2.5 MG tablet Take 1 tablet (2.5 mg total) by mouth daily. Needs office visit prior to anymore refills  . amphetamine-dextroamphetamine (ADDERALL) 30 MG tablet Take 1-2 tablets by mouth daily.  Marland Kitchen LORazepam (ATIVAN) 1 MG tablet Take 2 tablets (2 mg total) by mouth at bedtime.  . pravastatin (PRAVACHOL) 20 MG tablet Take 1 tablet (20 mg total) by mouth daily.  Marland Kitchen testosterone cypionate (DEPOTESTOSTERONE CYPIONATE) 200 MG/ML injection Inject 3 mLs once a week into the muscle. (Patient taking differently: Inject 0.3 mLs once a week into the muscle.)    Allergies  Allergen Reactions  . Amphetamines     Intolerant to specific formulations: [employer] amphetamine TEVA dextroamp amphetamine  . Lorazepam     Mylan lorazepam; others ok  . Methylprednisolone   . Penicillins Hives  . Propofol     Cognitive delay and emotional    Social  History   Tobacco Use  . Smoking status: Former Smoker    Packs/day: 1.50    Years: 25.00    Pack years: 37.50    Types: Cigarettes    Last attempt to quit: 11/22/2014    Years since quitting: 3.4  . Smokeless tobacco: Never Used  . Tobacco comment: Encouraged to remain smoke free  Substance Use Topics  . Alcohol use: No    Alcohol/week: 0.0 standard drinks  . Drug use: No   Social History   Social History Narrative   Divorced. B.A. degree. Retired.   Drinks caffeine, uses herbal remedies, takes a daily vitamin.   Wears his seatbelt.  Smoke detector in the home.   Firearms locked in the home.   Feels safe in relationships.     family history includes Cancer in his paternal grandfather; Heart disease in his maternal grandfather; Hyperlipidemia in his father; Mental illness in his brother and mother.  Wt Readings from Last 3 Encounters:  05/19/18 193 lb (87.5 kg)  05/12/18 190 lb 8 oz (86.4 kg)  03/23/18 189 lb 3.2 oz (85.8 kg)    PHYSICAL EXAM BP 130/80 (BP Location: Right Arm, Patient  Position: Sitting, Cuff Size: Normal)   Pulse 68   Ht 6\' 1"  (1.854 m)   Wt 193 lb (87.5 kg)   BMI 25.46 kg/m  Physical Exam  Constitutional: He is oriented to person, place, and time. He appears well-developed and well-nourished. No distress.  Healthy-appearing.  Well-groomed  HENT:  Head: Normocephalic and atraumatic.  Mouth/Throat: Oropharynx is clear and moist.  Eyes: Pupils are equal, round, and reactive to light. Conjunctivae and EOM are normal. No scleral icterus.  Neck: Normal range of motion. Neck supple. No hepatojugular reflux and no JVD present. Carotid bruit is not present. No tracheal deviation present. No thyromegaly present.  Cardiovascular: Normal rate, regular rhythm, normal heart sounds and intact distal pulses.  No extrasystoles are present. PMI is not displaced. Exam reveals no gallop and no friction rub.  No murmur heard. Pulmonary/Chest: Breath sounds normal. No  respiratory distress. He has no wheezes. He has no rales.  Abdominal: Soft. Bowel sounds are normal. He exhibits no distension. There is no abdominal tenderness. There is no rebound.  Musculoskeletal: Normal range of motion.        General: No edema.  Neurological: He is alert and oriented to person, place, and time. No cranial nerve deficit.  Psychiatric: He has a normal mood and affect. His behavior is normal. Judgment and thought content normal.  Vitals reviewed.    Adult ECG Report  Rate: 68 ;  Rhythm: normal sinus rhythm; normal axis, intervals and durations  Narrative Interpretation: Normal EKG   Other studies Reviewed: Additional studies/ records that were reviewed today include:  Recent Labs:   Lab Results  Component Value Date   CREATININE 0.87 03/02/2018   BUN 14 03/02/2018   NA 139 03/02/2018   K 4.8 03/02/2018   CL 102 03/02/2018   CO2 31 03/02/2018   Lab Results  Component Value Date   CHOL 211 (H) 12/15/2017   HDL 61.60 12/15/2017   LDLCALC 133 (H) 12/15/2017   TRIG 83.0 12/15/2017   CHOLHDL 3 12/15/2017    ASSESSMENT / PLAN: Problem List Items Addressed This Visit    Coronary artery calcification seen on CAT scan - Primary (Chronic)    Three-vessel calcification noted on screening chest CT.  Previous CTs have also shown coronary calcification. I spent some time explaining to him the pathophysiology of atherosclerosis and what coronary calcification means. He is a totally asymptomatic patient with no real risk factors besides borderline hypertension and hyperlipidemia and being a male at age 10.  I do not think that a stress test would be very beneficial as he likely would do very well and it would be nonischemic. For baseline screening however, I think coronary calcium score would be warranted to quantify the amount of coronary calcification seen.  If that level is high, or if there are concerning features, would consider coronary CTA at that  time.  Discussed risk factor modification including blood pressure and cholesterol control.  He has quit smoking. Continue aspirin and statin.      Relevant Orders   EKG 12-Lead (Completed)   CT CARDIAC SCORING   Erectile dysfunction (Chronic)    This is a feature that is somewhat concerning for cardiovascular risk. Blood pressure control and statin is important. Cardiac evaluation with coronary calcium score.      Hyperlipidemia with target LDL less than 100 (Chronic)    With evidence of CAD on CT scan, would warrant a little more aggressive lipid management.  He had been reluctant  to stay on a statin, however I reassured him this is probably a smart idea.  His most recent LDL was 133, and I think we should probably shoot for less than 100.  Hopefully he can reach that goal with pravastatin otherwise he would want to switch to a more potent statin.  More recommendations based on coronary calcium score.      Raynaud's disease    Controlled with aspirin and amlodipine      Relevant Orders   EKG 12-Lead (Completed)   CT CARDIAC SCORING    Other Visit Diagnoses    Other specified personal risk factors, not elsewhere classified       Relevant Orders   CT CARDIAC SCORING       I spent a total of 35 minutes with the patient and ~10-15 chart review / annotation. >  50% of the time was spent in direct patient consultation. We discussed what coronary calcification means, the pathophysiology of atherosclerosis and management going forward.  We discussed his prior history with RMSF etc.  Current medicines are reviewed at length with the patient today.  (+/- concerns) none The following changes have been made:  None.  Recommend that he does keep taking statin.  Patient Instructions  Medication Instructions:    KEEP TAKING YOU STATIN MEDICATIONS   If you need a refill on your cardiac medications before your next appointment, please call your pharmacy.   Lab work: Not needed If  you have labs (blood work) drawn today and your tests are completely normal, you will receive your results only by: Marland Kitchen MyChart Message (if you have MyChart) OR . A paper copy in the mail If you have any lab test that is abnormal or we need to change your treatment, we will call you to review the results.  Testing/Procedures CT coronary calcium score. This test is done at [address]. This is $150 out of pocket.  Follow-Up: At [facility], you and your health needs are our priority.  As part of our continuing mission to provide you with exceptional heart care, we have created designated Provider Care Teams.  These Care Teams include your primary Cardiologist (physician) and Advanced Practice Providers (APPs -  Physician Assistants and Nurse Practitioners) who all work together to provide you with the care you need, when you need it. . Your physician recommends that you schedule a follow-up appointment in 49 month with DR  . If coronary calcium score is significantly abnormal, will see sooner.  Any Other Special Instructions Will Be Listed Below (If Applicable).  Studies Ordered:   Orders Placed This Encounter  Procedures  . CT CARDIAC SCORING  . EKG 12-Lead      [name], M.D., M.S. Interventional Cardiologist   Pager # [phone] Phone # [phone] [address] [place], [place] [address]   Thank you for choosing Heartcare at [facility]!!

## 2018-05-21 ENCOUNTER — Inpatient Hospital Stay: Admission: RE | Admit: 2018-05-21 | Payer: BLUE CROSS/BLUE SHIELD | Source: Ambulatory Visit

## 2018-05-22 ENCOUNTER — Encounter: Payer: Self-pay | Admitting: Cardiology

## 2018-05-22 DIAGNOSIS — E785 Hyperlipidemia, unspecified: Secondary | ICD-10-CM | POA: Insufficient documentation

## 2018-05-22 NOTE — Assessment & Plan Note (Signed)
This is a feature that is somewhat concerning for cardiovascular risk. Blood pressure control and statin is important. Cardiac evaluation with coronary calcium score.

## 2018-05-22 NOTE — Assessment & Plan Note (Addendum)
Three-vessel calcification noted on screening chest CT.  Previous CTs have also shown coronary calcification. I spent some time explaining to him the pathophysiology of atherosclerosis and what coronary calcification means. He is a totally asymptomatic patient with no real risk factors besides borderline hypertension and hyperlipidemia and being a male at age 63.  I do not think that a stress test would be very beneficial as he likely would do very well and it would be nonischemic. For baseline screening however, I think coronary calcium score would be warranted to quantify the amount of coronary calcification seen.  If that level is high, or if there are concerning features, would consider coronary CTA at that time.  Discussed risk factor modification including blood pressure and cholesterol control.  He has quit smoking. Continue aspirin and statin.

## 2018-05-22 NOTE — Assessment & Plan Note (Signed)
Controlled with aspirin and amlodipine

## 2018-05-22 NOTE — Assessment & Plan Note (Signed)
With evidence of CAD on CT scan, would warrant a little more aggressive lipid management.  He had been reluctant to stay on a statin, however I reassured him this is probably a smart idea.  His most recent LDL was 133, and I think we should probably shoot for less than 100.  Hopefully he can reach that goal with pravastatin otherwise he would want to switch to a more potent statin.  More recommendations based on coronary calcium score.

## 2018-05-26 DIAGNOSIS — M7918 Myalgia, other site: Secondary | ICD-10-CM | POA: Diagnosis not present

## 2018-05-26 DIAGNOSIS — M9901 Segmental and somatic dysfunction of cervical region: Secondary | ICD-10-CM | POA: Diagnosis not present

## 2018-05-26 DIAGNOSIS — M9902 Segmental and somatic dysfunction of thoracic region: Secondary | ICD-10-CM | POA: Diagnosis not present

## 2018-05-26 DIAGNOSIS — M7541 Impingement syndrome of right shoulder: Secondary | ICD-10-CM | POA: Diagnosis not present

## 2018-05-31 DIAGNOSIS — F064 Anxiety disorder due to known physiological condition: Secondary | ICD-10-CM | POA: Diagnosis not present

## 2018-05-31 DIAGNOSIS — R419 Unspecified symptoms and signs involving cognitive functions and awareness: Secondary | ICD-10-CM | POA: Diagnosis not present

## 2018-05-31 DIAGNOSIS — F0632 Mood disorder due to known physiological condition with major depressive-like episode: Secondary | ICD-10-CM | POA: Diagnosis not present

## 2018-06-02 ENCOUNTER — Other Ambulatory Visit: Payer: Self-pay | Admitting: Neurology

## 2018-06-02 ENCOUNTER — Telehealth: Payer: Self-pay | Admitting: Neurology

## 2018-06-02 MED ORDER — AMPHETAMINE-DEXTROAMPHETAMINE 30 MG PO TABS: 30.0000 mg | ORAL_TABLET | Freq: Every day | ORAL | 0 refills | Status: DC

## 2018-06-02 NOTE — Telephone Encounter (Signed)
Yes, please - send all. CD

## 2018-06-02 NOTE — Telephone Encounter (Signed)
The patient's assistant [name]) called and requested a refill for his adderral rx. She would like to know if could be sent over for 3 months for the pharmacy to hold. She states this was done last time!

## 2018-06-02 NOTE — Telephone Encounter (Signed)
I have routed this request to Dr [name] for review. The pt is due for the medication and [place] registry was verified. I sent only this month rx for now and I will ask if Dr [name] is ok with me refilling for 2 other months. If so then I will send

## 2018-06-02 NOTE — Telephone Encounter (Signed)
Dr [name] has already sent the script for the month of jan. Will send the other two months for the patient for the pharmacy to place on hold.

## 2018-06-05 HISTORY — PX: CT CTA CORONARY W/CA SCORE W/CM &/OR WO/CM: HXRAD787

## 2018-06-09 ENCOUNTER — Ambulatory Visit (INDEPENDENT_AMBULATORY_CARE_PROVIDER_SITE_OTHER)
Admission: RE | Admit: 2018-06-09 | Discharge: 2018-06-09 | Disposition: A | Discharge status: Home / Self Care | Payer: Self-pay | Source: Ambulatory Visit | Attending: Cardiology | Admitting: Cardiology

## 2018-06-09 DIAGNOSIS — I2510 Atherosclerotic heart disease of native coronary artery without angina pectoris: Secondary | ICD-10-CM

## 2018-06-09 DIAGNOSIS — Z9189 Other specified personal risk factors, not elsewhere classified: Secondary | ICD-10-CM

## 2018-06-09 DIAGNOSIS — I7300 Raynaud's syndrome without gangrene: Secondary | ICD-10-CM

## 2018-06-10 ENCOUNTER — Telehealth: Payer: Self-pay | Admitting: *Deleted

## 2018-06-10 ENCOUNTER — Encounter: Payer: Self-pay | Admitting: Family Medicine

## 2018-06-10 DIAGNOSIS — I2510 Atherosclerotic heart disease of native coronary artery without angina pectoris: Secondary | ICD-10-CM

## 2018-06-10 DIAGNOSIS — Z01818 Encounter for other preprocedural examination: Secondary | ICD-10-CM

## 2018-06-10 DIAGNOSIS — R931 Abnormal findings on diagnostic imaging of heart and coronary circulation: Secondary | ICD-10-CM

## 2018-06-10 DIAGNOSIS — E785 Hyperlipidemia, unspecified: Secondary | ICD-10-CM

## 2018-06-10 MED ORDER — METOPROLOL TARTRATE 50 MG PO TABS: 100.0000 mg | ORAL_TABLET | Freq: Once | ORAL | 0 refills | Status: DC

## 2018-06-10 NOTE — Telephone Encounter (Signed)
-----   Message from [name], MD sent at 06/09/2018  9:49 PM EST ----- Coronary calcium score results shows aortic atherosclerosis (with mild dilation) as well as left main and three-vessel coronary artery calcification.  The calcium score is 1168 which is very high risk amount of calcium. Based on these findings, I would recommend proceeding with a coronary CT angiogram (a more detailed study with Intravenous Xray contrast to show the actually heart arteries) to assess for any significant coronary artery blockages --this will allow [place] to search for potential blockages and the physiologic significance of these blockages.  We will also need to consider reevaluating a CT scan of simply the aorta in 6 months based on mild dilation and atherosclerosis of the aorta.  For the most part the episode of this is that it does appear that there is significant heart artery disease, it may not be occlusive (in other words, a blockage greater than 70%).   These findings mean that I would recommend aggressive risk factor modification with cholesterol, blood sugar and blood pressure management despite what the CT Angiogram shows.  It would make sense to do this prior to f/u visit.  [name]

## 2018-06-10 NOTE — Telephone Encounter (Addendum)
LEFT MESSAGE ON SECURE VOICEMAIL-  TEST  Was SOMEWHAT ABNORMAL AND NEED TO DO ANOTHER TEST  PLEASE CALL  FOR INSTRUCTIONS  NEED TO SCEHDULE CCTA PRIOR TO  AN FOLLOW UP APPT  IS NEEDED TO DISCUSS RESULTS

## 2018-06-10 NOTE — Telephone Encounter (Signed)
Patient  And friend walked into the office with copy of mychart test results. Patient very anxious about results,  Information given and instructions. Patient is aware of the process of test being schedule and follow up appointment Schedule with Dr Ellyn Hack. PATIENT REQUEST TO SEE IF TEST CAN BE DONE AS SOON AS POSIBLE.  RN informed patient will send message to scheduling a nd per cert department

## 2018-06-14 DIAGNOSIS — F0632 Mood disorder due to known physiological condition with major depressive-like episode: Secondary | ICD-10-CM | POA: Diagnosis not present

## 2018-06-14 DIAGNOSIS — F064 Anxiety disorder due to known physiological condition: Secondary | ICD-10-CM | POA: Diagnosis not present

## 2018-06-14 DIAGNOSIS — R419 Unspecified symptoms and signs involving cognitive functions and awareness: Secondary | ICD-10-CM | POA: Diagnosis not present

## 2018-06-15 DIAGNOSIS — Z01818 Encounter for other preprocedural examination: Secondary | ICD-10-CM | POA: Diagnosis not present

## 2018-06-15 DIAGNOSIS — E785 Hyperlipidemia, unspecified: Secondary | ICD-10-CM | POA: Diagnosis not present

## 2018-06-15 DIAGNOSIS — R931 Abnormal findings on diagnostic imaging of heart and coronary circulation: Secondary | ICD-10-CM | POA: Diagnosis not present

## 2018-06-15 DIAGNOSIS — I2510 Atherosclerotic heart disease of native coronary artery without angina pectoris: Secondary | ICD-10-CM | POA: Diagnosis not present

## 2018-06-16 ENCOUNTER — Telehealth (HOSPITAL_COMMUNITY): Payer: Self-pay | Admitting: Emergency Medicine

## 2018-06-16 LAB — BASIC METABOLIC PANEL
BUN/Creatinine Ratio: 17 (ref 10–24)
BUN: 16 mg/dL (ref 8–27)
CO2: 23 mmol/L (ref 20–29)
Calcium: 9.5 mg/dL (ref 8.6–10.2)
Chloride: 102 mmol/L (ref 96–106)
Creatinine, Ser: 0.93 mg/dL (ref 0.76–1.27)
GFR calc Af Amer: 103 mL/min/{1.73_m2} (ref >59)
GFR calc non Af Amer: 89 mL/min/{1.73_m2} (ref >59)
GLUCOSE: 107 mg/dL — AB (ref 65–99)
POTASSIUM: 5.2 mmol/L (ref 3.5–5.2)
SODIUM: 141 mmol/L (ref 134–144)

## 2018-06-16 NOTE — Telephone Encounter (Signed)
Reaching out to patient to offer assistance regarding upcoming cardiac imaging study; pt verbalizes understanding of appt date/time, parking situation and where to check in, pre-test NPO status and medications ordered, and verified current allergies; name and call back number provided for further questions should they arise Marchia Bond RN Navigator Cardiac Imaging [facility] [phone] office [phone] cell   Pt to take metoprolol 2 hr prior to scan

## 2018-06-16 NOTE — Telephone Encounter (Signed)
Left message on voicemail with name and callback number Sher Shampine RN Navigator Cardiac Imaging [facility] [phone] Office [phone] Cell  

## 2018-06-18 ENCOUNTER — Ambulatory Visit (HOSPITAL_COMMUNITY)
Admission: RE | Admit: 2018-06-18 | Discharge: 2018-06-18 | Disposition: A | Discharge status: Home / Self Care | Payer: BLUE CROSS/BLUE SHIELD | Source: Ambulatory Visit | Attending: Cardiology | Admitting: Cardiology

## 2018-06-18 ENCOUNTER — Telehealth: Payer: Self-pay | Admitting: Cardiology

## 2018-06-18 DIAGNOSIS — R931 Abnormal findings on diagnostic imaging of heart and coronary circulation: Secondary | ICD-10-CM | POA: Insufficient documentation

## 2018-06-18 DIAGNOSIS — I2510 Atherosclerotic heart disease of native coronary artery without angina pectoris: Secondary | ICD-10-CM | POA: Diagnosis not present

## 2018-06-18 DIAGNOSIS — E785 Hyperlipidemia, unspecified: Secondary | ICD-10-CM | POA: Diagnosis not present

## 2018-06-18 MED ORDER — NITROGLYCERIN 0.4 MG SL SUBL
0.8000 mg | SUBLINGUAL_TABLET | Freq: Once | SUBLINGUAL | Status: AC
  Administered 2018-06-18: 0.8 mg via SUBLINGUAL
  Filled 2018-06-18: qty 25

## 2018-06-18 MED ORDER — NITROGLYCERIN 0.4 MG SL SUBL
SUBLINGUAL_TABLET | SUBLINGUAL | Status: AC
  Filled 2018-06-18: qty 2

## 2018-06-18 MED ORDER — IOPAMIDOL (ISOVUE-370) INJECTION 76%
80.0000 mL | Freq: Once | INTRAVENOUS | Status: AC | PRN
  Administered 2018-06-18: 80 mL via INTRAVENOUS

## 2018-06-18 NOTE — Telephone Encounter (Signed)
Returned pt call. Pt sts that he picked up his Metoprolol prescription in anticipation of his Coronary CT that is scheduled today at [place] that his pre-testing instructions sts that he is to take 2 tablets (100mg ) 2 hours prior to his test, but only 1 tablet was given. The Rx was written for 2 tabs, but the quantity sent in was 1 tab. Adv pt to go ahead a take the 50mg  now, adv him that if his HR is not slowed enough when he gets there they can give him some additional. Pt verbalizes understanding and voiced appreciation for the call back.

## 2018-06-18 NOTE — Telephone Encounter (Signed)
Patient has a test this morning and he has a question about the medication he is to take this morning before the test.

## 2018-06-21 ENCOUNTER — Ambulatory Visit (HOSPITAL_COMMUNITY)
Admission: RE | Admit: 2018-06-21 | Discharge: 2018-06-21 | Disposition: A | Discharge status: Home / Self Care | Payer: BLUE CROSS/BLUE SHIELD | Source: Ambulatory Visit | Attending: Cardiology | Admitting: Cardiology

## 2018-06-21 DIAGNOSIS — E785 Hyperlipidemia, unspecified: Secondary | ICD-10-CM | POA: Diagnosis not present

## 2018-06-21 DIAGNOSIS — I2510 Atherosclerotic heart disease of native coronary artery without angina pectoris: Secondary | ICD-10-CM | POA: Diagnosis not present

## 2018-06-21 DIAGNOSIS — R931 Abnormal findings on diagnostic imaging of heart and coronary circulation: Secondary | ICD-10-CM | POA: Diagnosis not present

## 2018-06-24 ENCOUNTER — Telehealth: Payer: Self-pay | Admitting: *Deleted

## 2018-06-24 ENCOUNTER — Other Ambulatory Visit: Payer: Self-pay | Admitting: *Deleted

## 2018-06-24 DIAGNOSIS — Z01818 Encounter for other preprocedural examination: Secondary | ICD-10-CM

## 2018-06-24 DIAGNOSIS — I2510 Atherosclerotic heart disease of native coronary artery without angina pectoris: Secondary | ICD-10-CM

## 2018-06-24 DIAGNOSIS — R9389 Abnormal findings on diagnostic imaging of other specified body structures: Secondary | ICD-10-CM

## 2018-06-24 NOTE — Telephone Encounter (Signed)
Per order, schedule  Cardiac cath for feb 26,2020 at 8:30 am  Arrival at 6:30 am. Spoke to patient with instructions  sent instruction via mychart. Patient verbalized understanding

## 2018-06-24 NOTE — Telephone Encounter (Signed)
-----   Message from [name], MD sent at 06/23/2018  7:31 PM EST ----- Patient called.  Plan to proceed with cardiac catheterization.  He is fine with doing this and not coming back for follow-up visit.  We will schedule for next week Wednesday or Thursday.  [name], MD

## 2018-06-24 NOTE — Progress Notes (Signed)
ORDER PLACED FOR UPCOMING CARDIAC CATH

## 2018-06-28 ENCOUNTER — Other Ambulatory Visit: Payer: Self-pay | Admitting: *Deleted

## 2018-06-28 ENCOUNTER — Telehealth: Payer: Self-pay | Admitting: *Deleted

## 2018-06-28 DIAGNOSIS — Z01818 Encounter for other preprocedural examination: Secondary | ICD-10-CM | POA: Diagnosis not present

## 2018-06-28 DIAGNOSIS — I2510 Atherosclerotic heart disease of native coronary artery without angina pectoris: Secondary | ICD-10-CM | POA: Diagnosis not present

## 2018-06-28 DIAGNOSIS — R9389 Abnormal findings on diagnostic imaging of other specified body structures: Secondary | ICD-10-CM | POA: Diagnosis not present

## 2018-06-28 LAB — CBC
Hematocrit: 42.3 % (ref 37.5–51.0)
Hemoglobin: 14.7 g/dL (ref 13.0–17.7)
MCH: 31.6 pg (ref 26.6–33.0)
MCHC: 34.8 g/dL (ref 31.5–35.7)
MCV: 91 fL (ref 79–97)
PLATELETS: 285 10*3/uL (ref 150–450)
RBC: 4.65 x10E6/uL (ref 4.14–5.80)
RDW: 12.4 % (ref 11.6–15.4)
WBC: 5.3 10*3/uL (ref 3.4–10.8)

## 2018-06-28 NOTE — Telephone Encounter (Signed)
Patient request a call in regards to  Upcoming cardiac cath. RN spoke to patient and fiend [name] by speaker phone.  Question answered concerning procedure . Patient wanted Dr Ellyn Hack to know to thinks -  explanation of  finding need to relayed to family friend  and patient- has hx of head injury, also patient in the past history of cellulitis in arm and knees in the past. Conversation lasted for over 15 minutes all question answered.  Dr Ellyn Hack aware of conversation.

## 2018-06-28 NOTE — Progress Notes (Signed)
LABS ORDER FOR CARDIAC  CATH

## 2018-06-29 ENCOUNTER — Telehealth: Payer: Self-pay | Admitting: *Deleted

## 2018-06-29 LAB — BASIC METABOLIC PANEL
BUN/Creatinine Ratio: 15 (ref 10–24)
BUN: 14 mg/dL (ref 8–27)
CHLORIDE: 101 mmol/L (ref 96–106)
CO2: 25 mmol/L (ref 20–29)
Calcium: 9.7 mg/dL (ref 8.6–10.2)
Creatinine, Ser: 0.95 mg/dL (ref 0.76–1.27)
GFR calc Af Amer: 100 mL/min/{1.73_m2} (ref >59)
GFR calc non Af Amer: 87 mL/min/{1.73_m2} (ref >59)
Glucose: 87 mg/dL (ref 65–99)
Potassium: 5.1 mmol/L (ref 3.5–5.2)
Sodium: 141 mmol/L (ref 134–144)

## 2018-06-29 NOTE — Telephone Encounter (Signed)
Pt contacted pre-catheterization scheduled at [facility] for: Wednesday June 30, 2018 8:30 AM Verified arrival time and place: [facility] at: 6:30 AM  No solid food after midnight prior to cath, clear liquids until 5 AM day of procedure. Contrast allergy: no Verified no diabetes medications.  AM meds can be  taken pre-cath with sip of water including: ASA 81 mg  Confirmed patient has responsible person to drive home post procedure and observe 24 hours after arriving home: yes

## 2018-06-30 ENCOUNTER — Encounter (HOSPITAL_COMMUNITY)
Admission: RE | Disposition: A | Discharge status: Home / Self Care | Payer: Self-pay | Source: Home / Self Care | Attending: Cardiology

## 2018-06-30 ENCOUNTER — Other Ambulatory Visit: Payer: Self-pay

## 2018-06-30 ENCOUNTER — Encounter (HOSPITAL_COMMUNITY): Payer: Self-pay | Admitting: Cardiology

## 2018-06-30 ENCOUNTER — Ambulatory Visit (HOSPITAL_COMMUNITY)
Admission: RE | Admit: 2018-06-30 | Discharge: 2018-07-01 | Disposition: A | Discharge status: Home / Self Care | Payer: BLUE CROSS/BLUE SHIELD | Attending: Cardiology | Admitting: Cardiology

## 2018-06-30 DIAGNOSIS — G2581 Restless legs syndrome: Secondary | ICD-10-CM | POA: Insufficient documentation

## 2018-06-30 DIAGNOSIS — Z01818 Encounter for other preprocedural examination: Secondary | ICD-10-CM

## 2018-06-30 DIAGNOSIS — R079 Chest pain, unspecified: Secondary | ICD-10-CM | POA: Diagnosis not present

## 2018-06-30 DIAGNOSIS — R931 Abnormal findings on diagnostic imaging of heart and coronary circulation: Secondary | ICD-10-CM | POA: Diagnosis not present

## 2018-06-30 DIAGNOSIS — Z880 Allergy status to penicillin: Secondary | ICD-10-CM | POA: Insufficient documentation

## 2018-06-30 DIAGNOSIS — Z87891 Personal history of nicotine dependence: Secondary | ICD-10-CM | POA: Insufficient documentation

## 2018-06-30 DIAGNOSIS — Z888 Allergy status to other drugs, medicaments and biological substances status: Secondary | ICD-10-CM | POA: Insufficient documentation

## 2018-06-30 DIAGNOSIS — Z7982 Long term (current) use of aspirin: Secondary | ICD-10-CM | POA: Diagnosis not present

## 2018-06-30 DIAGNOSIS — I2510 Atherosclerotic heart disease of native coronary artery without angina pectoris: Secondary | ICD-10-CM | POA: Diagnosis not present

## 2018-06-30 DIAGNOSIS — N529 Male erectile dysfunction, unspecified: Secondary | ICD-10-CM | POA: Diagnosis not present

## 2018-06-30 DIAGNOSIS — Z8249 Family history of ischemic heart disease and other diseases of the circulatory system: Secondary | ICD-10-CM | POA: Diagnosis not present

## 2018-06-30 DIAGNOSIS — Z87820 Personal history of traumatic brain injury: Secondary | ICD-10-CM | POA: Diagnosis not present

## 2018-06-30 DIAGNOSIS — Z955 Presence of coronary angioplasty implant and graft: Secondary | ICD-10-CM

## 2018-06-30 DIAGNOSIS — R9389 Abnormal findings on diagnostic imaging of other specified body structures: Secondary | ICD-10-CM

## 2018-06-30 DIAGNOSIS — I10 Essential (primary) hypertension: Secondary | ICD-10-CM | POA: Diagnosis not present

## 2018-06-30 DIAGNOSIS — I7300 Raynaud's syndrome without gangrene: Secondary | ICD-10-CM | POA: Diagnosis not present

## 2018-06-30 DIAGNOSIS — Z79899 Other long term (current) drug therapy: Secondary | ICD-10-CM | POA: Insufficient documentation

## 2018-06-30 DIAGNOSIS — R0789 Other chest pain: Secondary | ICD-10-CM | POA: Diagnosis not present

## 2018-06-30 DIAGNOSIS — E785 Hyperlipidemia, unspecified: Secondary | ICD-10-CM | POA: Diagnosis present

## 2018-06-30 DIAGNOSIS — R7989 Other specified abnormal findings of blood chemistry: Secondary | ICD-10-CM | POA: Diagnosis not present

## 2018-06-30 DIAGNOSIS — I4510 Unspecified right bundle-branch block: Secondary | ICD-10-CM | POA: Diagnosis not present

## 2018-06-30 DIAGNOSIS — Z9861 Coronary angioplasty status: Secondary | ICD-10-CM

## 2018-06-30 HISTORY — DX: Other complications of anesthesia, initial encounter: T88.59XA

## 2018-06-30 HISTORY — DX: Adverse effect of unspecified anesthetic, initial encounter: T41.45XA

## 2018-06-30 HISTORY — PX: CORONARY STENT INTERVENTION: CATH118234

## 2018-06-30 HISTORY — PX: LEFT HEART CATH AND CORONARY ANGIOGRAPHY: CATH118249

## 2018-06-30 LAB — POCT ACTIVATED CLOTTING TIME
Activated Clotting Time: 257 seconds
Activated Clotting Time: 274 seconds

## 2018-06-30 SURGERY — LEFT HEART CATH AND CORONARY ANGIOGRAPHY
Anesthesia: LOCAL

## 2018-06-30 MED ORDER — SODIUM CHLORIDE 0.9% FLUSH: 3.0000 mL | Freq: Two times a day (BID) | INTRAVENOUS | Status: DC

## 2018-06-30 MED ORDER — LIDOCAINE HCL (PF) 1 % IJ SOLN
INTRAMUSCULAR | Status: AC
  Filled 2018-06-30: qty 30

## 2018-06-30 MED ORDER — FENTANYL CITRATE (PF) 100 MCG/2ML IJ SOLN
INTRAMUSCULAR | Status: AC
  Filled 2018-06-30: qty 2

## 2018-06-30 MED ORDER — CLOPIDOGREL BISULFATE 300 MG PO TABS
ORAL_TABLET | ORAL | Status: AC
  Filled 2018-06-30: qty 2

## 2018-06-30 MED ORDER — THE SENSUOUS HEART BOOK
Freq: Once | Status: AC
  Administered 2018-06-30: 1
  Filled 2018-06-30: qty 1

## 2018-06-30 MED ORDER — NITROGLYCERIN 1 MG/10 ML FOR IR/CATH LAB
INTRA_ARTERIAL | Status: DC | PRN
  Administered 2018-06-30: 200 ug

## 2018-06-30 MED ORDER — IOHEXOL 350 MG/ML SOLN
INTRAVENOUS | Status: DC | PRN
  Administered 2018-06-30: 200 mL via INTRAVENOUS

## 2018-06-30 MED ORDER — SODIUM CHLORIDE 0.9 % IV SOLN: INTRAVENOUS | Status: DC

## 2018-06-30 MED ORDER — MIDAZOLAM HCL 2 MG/2ML IJ SOLN
INTRAMUSCULAR | Status: AC
  Filled 2018-06-30: qty 2

## 2018-06-30 MED ORDER — B COMPLEX-C PO TABS
1.0000 | ORAL_TABLET | Freq: Every day | ORAL | Status: DC
  Filled 2018-06-30 (×2): qty 1

## 2018-06-30 MED ORDER — VERAPAMIL HCL 2.5 MG/ML IV SOLN
INTRAVENOUS | Status: DC | PRN
  Administered 2018-06-30: 10 mL via INTRA_ARTERIAL

## 2018-06-30 MED ORDER — LABETALOL HCL 5 MG/ML IV SOLN: 10.0000 mg | INTRAVENOUS | Status: DC | PRN

## 2018-06-30 MED ORDER — HEPARIN SODIUM (PORCINE) 1000 UNIT/ML IJ SOLN
INTRAMUSCULAR | Status: DC | PRN
  Administered 2018-06-30: 4000 [IU] via INTRAVENOUS
  Administered 2018-06-30: 2000 [IU] via INTRAVENOUS
  Administered 2018-06-30: 5000 [IU] via INTRAVENOUS

## 2018-06-30 MED ORDER — ACETAMINOPHEN 325 MG PO TABS: 650.0000 mg | ORAL_TABLET | ORAL | Status: DC | PRN

## 2018-06-30 MED ORDER — HYDRALAZINE HCL 20 MG/ML IJ SOLN: 5.0000 mg | INTRAMUSCULAR | Status: DC | PRN

## 2018-06-30 MED ORDER — ASPIRIN EC 81 MG PO TBEC
81.0000 mg | DELAYED_RELEASE_TABLET | Freq: Every day | ORAL | Status: DC
  Administered 2018-07-01: 10:00:00 81 mg via ORAL
  Filled 2018-06-30: qty 1

## 2018-06-30 MED ORDER — LORAZEPAM 1 MG PO TABS
2.0000 mg | ORAL_TABLET | Freq: Every day | ORAL | Status: DC
  Administered 2018-06-30: 2 mg via ORAL
  Filled 2018-06-30: qty 2

## 2018-06-30 MED ORDER — CLOPIDOGREL BISULFATE 75 MG PO TABS
75.0000 mg | ORAL_TABLET | Freq: Every day | ORAL | Status: DC
  Administered 2018-07-01: 10:00:00 75 mg via ORAL
  Filled 2018-06-30: qty 1

## 2018-06-30 MED ORDER — MIDAZOLAM HCL 2 MG/2ML IJ SOLN
INTRAMUSCULAR | Status: DC | PRN
  Administered 2018-06-30: 1 mg via INTRAVENOUS
  Administered 2018-06-30: 2 mg via INTRAVENOUS

## 2018-06-30 MED ORDER — NITROGLYCERIN 1 MG/10 ML FOR IR/CATH LAB
INTRA_ARTERIAL | Status: AC
  Filled 2018-06-30: qty 10

## 2018-06-30 MED ORDER — LIDOCAINE HCL (PF) 1 % IJ SOLN
INTRAMUSCULAR | Status: DC | PRN
  Administered 2018-06-30: 2 mL

## 2018-06-30 MED ORDER — PRAVASTATIN SODIUM 40 MG PO TABS: 40.0000 mg | ORAL_TABLET | Freq: Every day | ORAL | Status: DC

## 2018-06-30 MED ORDER — CLOPIDOGREL BISULFATE 300 MG PO TABS
ORAL_TABLET | ORAL | Status: DC | PRN
  Administered 2018-06-30: 600 mg via ORAL

## 2018-06-30 MED ORDER — HEPARIN (PORCINE) IN NACL 1000-0.9 UT/500ML-% IV SOLN
INTRAVENOUS | Status: AC
  Filled 2018-06-30: qty 1000

## 2018-06-30 MED ORDER — SODIUM CHLORIDE 0.9% FLUSH: 3.0000 mL | INTRAVENOUS | Status: DC | PRN

## 2018-06-30 MED ORDER — FENTANYL CITRATE (PF) 100 MCG/2ML IJ SOLN
INTRAMUSCULAR | Status: DC | PRN
  Administered 2018-06-30 (×2): 25 ug via INTRAVENOUS

## 2018-06-30 MED ORDER — FAMOTIDINE IN NACL 20-0.9 MG/50ML-% IV SOLN
INTRAVENOUS | Status: AC | PRN
  Administered 2018-06-30: 20 mg via INTRAVENOUS

## 2018-06-30 MED ORDER — SODIUM CHLORIDE 0.9 % IV SOLN
INTRAVENOUS | Status: DC
  Administered 2018-06-30: 07:00:00 via INTRAVENOUS

## 2018-06-30 MED ORDER — AMLODIPINE BESYLATE 2.5 MG PO TABS
2.5000 mg | ORAL_TABLET | Freq: Every day | ORAL | Status: DC
  Filled 2018-06-30: qty 1

## 2018-06-30 MED ORDER — HEPARIN SODIUM (PORCINE) 1000 UNIT/ML IJ SOLN
INTRAMUSCULAR | Status: AC
  Filled 2018-06-30: qty 1

## 2018-06-30 MED ORDER — ANGIOPLASTY BOOK
Freq: Once | Status: AC
  Administered 2018-06-30: 20:00:00 1
  Filled 2018-06-30: qty 1

## 2018-06-30 MED ORDER — ONDANSETRON HCL 4 MG/2ML IJ SOLN: 4.0000 mg | Freq: Four times a day (QID) | INTRAMUSCULAR | Status: DC | PRN

## 2018-06-30 MED ORDER — VERAPAMIL HCL 2.5 MG/ML IV SOLN
INTRAVENOUS | Status: AC
  Filled 2018-06-30: qty 2

## 2018-06-30 MED ORDER — HEPARIN (PORCINE) IN NACL 1000-0.9 UT/500ML-% IV SOLN
INTRAVENOUS | Status: DC | PRN
  Administered 2018-06-30 (×2): 500 mL

## 2018-06-30 MED ORDER — SODIUM CHLORIDE 0.9 % IV SOLN: 250.0000 mL | INTRAVENOUS | Status: DC | PRN

## 2018-06-30 MED ORDER — FAMOTIDINE IN NACL 20-0.9 MG/50ML-% IV SOLN
INTRAVENOUS | Status: AC
  Filled 2018-06-30: qty 50

## 2018-06-30 SURGICAL SUPPLY — 20 items
BALLN SAPPHIRE 2.5X15 (BALLOONS) ×2
BALLN SAPPHIRE ~~LOC~~ 3.0X18 (BALLOONS) ×1 IMPLANT
BALLOON SAPPHIRE 2.5X15 (BALLOONS) IMPLANT
CATH INFINITI 5FR ANG PIGTAIL (CATHETERS) ×1 IMPLANT
CATH INFINITI JR4 5F (CATHETERS) ×1 IMPLANT
CATH OPTITORQUE TIG 4.0 5F (CATHETERS) ×1 IMPLANT
CATH VISTA GUIDE 6FR XBLAD3.5 (CATHETERS) ×1 IMPLANT
DEVICE RAD COMP TR BAND LRG (VASCULAR PRODUCTS) ×1 IMPLANT
GLIDESHEATH SLEND SS 6F .021 (SHEATH) ×1 IMPLANT
GUIDEWIRE INQWIRE 1.5J.035X260 (WIRE) IMPLANT
INQWIRE 1.5J .035X260CM (WIRE) ×2
KIT ENCORE 26 ADVANTAGE (KITS) ×1 IMPLANT
KIT HEART LEFT (KITS) ×2 IMPLANT
PACK CARDIAC CATHETERIZATION (CUSTOM PROCEDURE TRAY) ×2 IMPLANT
SHEATH PROBE COVER 6X72 (BAG) ×1 IMPLANT
STENT SYNERGY DES 2.75X38 (Permanent Stent) ×1 IMPLANT
SYR MEDRAD MARK 7 150ML (SYRINGE) ×2 IMPLANT
TRANSDUCER W/STOPCOCK (MISCELLANEOUS) ×2 IMPLANT
TUBING CIL FLEX 10 FLL-RA (TUBING) ×2 IMPLANT
WIRE ASAHI PROWATER 180CM (WIRE) ×1 IMPLANT

## 2018-06-30 NOTE — Interval H&P Note (Signed)
History and Physical Interval Note:  06/30/2018 8:24 AM  Phillip [name]  has presented today for surgery, with the diagnosis of ABNORMAL CARDIAC CT ANGIOGRAPHY.    The various methods of treatment have been discussed with the patient and family. After consideration of risks, benefits and other options for treatment, the patient has consented to  Procedure(s): LEFT HEART CATH AND CORONARY ANGIOGRAPHY (N/A) with possible PERCUTANEOUS CORONARY INTERVENTION as a surgical intervention .    The patient's history has been reviewed, patient examined, no change in status, stable for surgery.  I have reviewed the patient's chart and labs.  Questions were answered to the patient's satisfaction.    Cath Lab Visit (complete for each Cath Lab visit)  Clinical Evaluation Leading to the Procedure:   ACS: No.  Non-ACS:   Anginal Classification: CCS I  Anti-ischemic medical therapy: Minimal Therapy (1 class of medications)  Non-Invasive Test Results: High-risk stress test findings: cardiac mortality >3%/year  Prior CABG: No previous CABG    [name]

## 2018-06-30 NOTE — Progress Notes (Signed)
TR BAND REMOVAL  LOCATION:    right radial  DEFLATED PER PROTOCOL:    Yes.    TIME BAND OFF / DRESSING APPLIED:  1445  SITE UPON ARRIVAL:    Level 0  SITE AFTER BAND REMOVAL:    Level 0  CIRCULATION SENSATION AND MOVEMENT:    Within Normal Limits   Yes.    COMMENTS:   Rechecked remainder of shift with no change noted.

## 2018-06-30 NOTE — H&P (Signed)
PCP: Rossie Muskrat Remainder of care team       [name], MD Consulting Physician Rheumatology  04/11/16   [name], MD Consulting Physician Internal Medicine  11/13/16    Comment: Testosterone  [name], MD Consulting Physician Urology  11/13/16   [name], NP Registered Nurse Neurology  11/13/16    Comment: Dr. Rush Landmark, [name], MD Consulting Physician Cardiology  11/13/16   [name], MD Consulting Physician Gastroenterology  12/10/16    Clinic Note: No chief complaint on file.  HPI: Phillip [name] is a 63 y.o. male notable for HLD & Fam Hx of CAD who presents today for invasive Coronary Evaluation with LHF-Cors & Possible PCI after having sequential evaluation from Coronary Calcium Score to Coronary CT Angiogram that showed significant LAD disease (+ FFR) as well as distal PL OM.    Phillip [name] was last seen on Jan 8th by PCP to discuss results of screening Chest CT ordered by pulmonary medicine.  Evidence of "three-vessel CAD/calcification ".  Noted that he is working out and no complaints of chest pain.  LDL is 133.  Does have family history of CAD albeit not premature & has a distant smoking history.  Well-controlled hypertension on low-dose amlodipine.  Amlodipine chosen because of history of Raynaud's syndrome -  He was seen by me for initial consultation on Jan 15th -- he denied any notable CV symptoms (but on retrospect may have noted some DOE).  He told me that he remains active doing yard work & walking "long-distances" 3-4 d/week.  He has had Raynaud's symptoms, but not recently.   Remainder of cardiac review of symptoms: No PND, orthopnea or edema.  No palpitations, lightheadedness, dizziness, weakness or syncope/near syncope. No TIA/amaurosis fugax symptoms. No claudication.  Studies Personally Reviewed - (if available, images/films reviewed: From Epic Chart or Care Everywhere)  TEE 09/2015: Normal LVEF. Mild P2  MVP with Mild MR.   (this was ordered as part of her evaluation for fevers of unknown origin  --> eventually diagnosed with RMSF --> had some vasculitis related Raynaud's syndrome at the time as well.)  Chest CT April 19, 2018-Mild 3 V Coronary Atherosclerosis (also mentioned in 2018)  ROS: A comprehensive was performed. Review of Systems  Constitutional: Positive for chills (Occasional chills and hot flashes). Negative for fever, malaise/fatigue and weight loss.  HENT: Negative for congestion and nosebleeds.   Respiratory: Negative for cough, shortness of breath and wheezing.   Gastrointestinal: Negative for abdominal pain, blood in stool, heartburn and melena.  Genitourinary: Negative for hematuria.  Musculoskeletal: Negative for joint pain and myalgias.  Skin: Negative.   Neurological: Negative for dizziness, focal weakness, weakness and headaches.  Psychiatric/Behavioral: Negative for memory loss. The patient is not nervous/anxious (Although he is a little bit anxious about the results of his CT scan) and does not have insomnia.   All other systems reviewed and are negative.  I have reviewed and (if needed) personally updated the patient's problem list, medications, allergies, past medical and surgical history, social and family history.   Past Medical History:  Diagnosis Date  . ADD (attention deficit disorder)    on adderrall managed by neurology  . Anxiety    on ativan managed by Neurology  . 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 Cellulitis   . Chronic traumatic encephalopathy  right frontal-temproal lobe  . Depression   . Erectile dysfunction 11/2016   follows with urology; sildenafil prescribed  . Meningoencephalitis 1994   [place]  . Pneumonia   . Primary hypogonadism in male   . Raynaud's disease 71/10/2692   As complication of RMSF  . Restless legs 09/13/2015  . [place] spotted fever 2017  . TBI (traumatic brain injury) ([place]) 1997, 2009   "concussions"  -- RAYNAUD'S SYNDROME  Past Surgical History:  Procedure Laterality Date  . TEE WITHOUT CARDIOVERSION N/A 09/06/2015   Procedure: TRANSESOPHAGEAL ECHOCARDIOGRAM (TEE);  Surgeon: Jerline Pain, MD;  R/O endocarditis;  . TONSILLECTOMY  1965    Current Meds  Medication Sig  . amLODipine (NORVASC) 2.5 MG tablet Take 1 tablet (2.5 mg total) by mouth daily. Needs office visit prior to anymore refills  . [START ON 08/01/2018] amphetamine-dextroamphetamine (ADDERALL) 30 MG tablet Take 1-2 tablets by mouth daily. (Patient taking differently: Take 30 mg by mouth 2 (two) times daily. Morning & afternoon.)  . Ascorbic Acid (VITAMIN C PO) Take 1 tablet by mouth 2 (two) times daily.  Marland Kitchen aspirin EC 81 MG tablet Take 81 mg by mouth daily.  . B Complex-C (B-COMPLEX WITH VITAMIN C) tablet Take 1 tablet by mouth daily.  . Coenzyme Q10 (COQ10) 100 MG CAPS Take 100 mg by mouth 2 (two) times daily.  Marland Kitchen LORazepam (ATIVAN) 1 MG tablet Take 2 tablets (2 mg total) by mouth at bedtime.  . Misc Natural Products (PROSTATE SUPPORT PO) Take 1 capsule by mouth every evening. Prostate Plus  . NON FORMULARY Take 1 capsule by mouth daily. MK-7 Supplements  . Omega-3 Fatty Acids (SUPER OMEGA 3 EPA/DHA PO) Take 1 capsule by mouth daily.  Marland Kitchen OVER THE COUNTER MEDICATION Take 1 capsule by mouth daily. Nitric oxide supplements  . pravastatin (PRAVACHOL) 20 MG tablet Take 1 tablet (20 mg total) by mouth daily. (Patient taking differently: Take 20 mg by mouth daily at 6 PM. )  . testosterone cypionate (DEPOTESTOSTERONE CYPIONATE) 200 MG/ML injection Inject 3 mLs once a week into the muscle. (Patient taking differently: Inject 60 mg into the muscle every Thursday. Inject 0.3 mLs once a week into the muscle.)  . vitamin E 400 UNIT capsule Take 400 Units by mouth daily.    Allergies  Allergen Reactions  . Penicillins Hives    Did it involve  swelling of the face/tongue/throat, SOB, or low BP? Unknown Did it involve sudden or severe rash/hives, skin peeling, or any reaction on the inside of your mouth or nose? Yes Did you need to seek medical attention at a hospital or doctor's office? Unknown When did it last happen?Occurred at age 74 or 54 If all above answers are "NO", may proceed with cephalosporin use.   . Methylprednisolone Other (See Comments)    Unsure of exact reaction type  . Amphetamines Other (See Comments)    Intolerant to specific formulations: [employer] amphetamine TEVA dextroamp amphetamine  . Lorazepam Other (See Comments)    Mylan lorazepam; others ok  . Propofol Other (See Comments)    Cognitive delay and emotional    Social History   Tobacco Use  . Smoking status: Former Smoker    Packs/day: 1.50    Years: 25.00    Pack years: 37.50    Types: Cigarettes    Last attempt to quit: 11/22/2014    Years since quitting: 3.6  . Smokeless tobacco: Never Used  . Tobacco comment: Encouraged to remain smoke free  Substance Use Topics  . Alcohol use: No    Alcohol/week: 0.0 standard drinks  . Drug use: No   Social History   Social History Narrative   Divorced. B.A. degree. Retired.   Drinks caffeine, uses herbal remedies, takes a daily vitamin.   Wears his seatbelt.  Smoke detector in the home.   Firearms locked in the home.   Feels safe in relationships.     family history includes Cancer in his paternal grandfather; Heart disease (age of onset: 85) in his maternal grandfather; Hyperlipidemia in his father; Mental illness in his brother and mother.  Wt Readings from Last 3 Encounters:  05/19/18 87.5 kg  05/12/18 86.4 kg  03/23/18 85.8 kg    PHYSICAL EXAM There were no vitals taken for this visit. Physical Exam  Constitutional: He is oriented to person, place, and time. He appears well-developed and well-nourished. No distress.  Healthy-appearing.  Well-groomed  HENT:  Head: Normocephalic  and atraumatic.  Mouth/Throat: Oropharynx is clear and moist.  Eyes: Pupils are equal, round, and reactive to light. Conjunctivae and EOM are normal. No scleral icterus.  Neck: Normal range of motion. Neck supple. No hepatojugular reflux and no JVD present. Carotid bruit is not present. No tracheal deviation present. No thyromegaly present.  Cardiovascular: Normal rate, regular rhythm, normal heart sounds and intact distal pulses.  No extrasystoles are present. PMI is not displaced. Exam reveals no gallop and no friction rub.  No murmur heard. Pulmonary/Chest: Breath sounds normal. No respiratory distress. He has no wheezes. He has no rales.  Abdominal: Soft. Bowel sounds are normal. He exhibits no distension. There is no abdominal tenderness. There is no rebound.  Musculoskeletal: Normal range of motion.        General: No edema.  Neurological: He is alert and oriented to person, place, and time. No cranial nerve deficit.  Psychiatric: He has a normal mood and affect. His behavior is normal. Judgment and thought content normal.  Vitals reviewed. Mallampati 1.   Adult ECG Report n/a   Other studies Reviewed: Additional studies/ records that were reviewed today include:  Recent Labs:   Lab Results  Component Value Date   CREATININE 0.95 06/28/2018   BUN 14 06/28/2018   NA 141 06/28/2018   K 5.1 06/28/2018   CL 101 06/28/2018   CO2 25 06/28/2018   Lab Results  Component Value Date   CHOL 211 (H) 12/15/2017   HDL 61.60 12/15/2017   LDLCALC 133 (H) 12/15/2017   TRIG 83.0 12/15/2017   CHOLHDL 3 12/15/2017    ASSESSMENT / PLAN: Principal Problem:   Abnormal cardiac CT angiography Active Problems:   Coronary artery calcification seen on CAT scan   Hyperlipidemia with target LDL less than 100   Erectile dysfunction   IMPRESSION & PLAN The patients' history has been reviewed, patient examined, no change in status from most recent note, stable for surgery. I have reviewed the  patients' chart and labs. Questions were answered to the patient's satisfaction.    Phillip [name] has presented today for Cardiac Catheterization, with the diagnosis of ABNORMAL CARDIAC CT ANGIOGRAPHY.. The various methods of treatment have been discussed with the patient and family.  We talked over the telephone & he has also spoken with Dr. Dorris Carnes (a friend of a friend) & Trixie Dredge, RN (my clinic RN).  Risks / Complications include, but not limited to: Death, MI, CVA/TIA, VF/VT (with defibrillation), Bradycardia (need for temporary pacer placement), contrast induced nephropathy, bleeding /  bruising / hematoma / pseudoaneurysm, vascular or coronary injury (with possible emergent CT or Vascular Surgery), adverse medication reactions, infection.     After consideration of risks, benefits and other options for treatment, the patient has consented to Procedure(s):   LEFT HEART CATHETERIZATION AND CORONARY ANGIOGRAPHY +/- [facility] [place]   as a surgical intervention.   We will proceed with the planned procedure.     [name], M.D., M.S. Interventional Cardiologist   Pager # [phone] Phone # [phone] [address]. [place], [place] [address]        .dhin

## 2018-07-01 ENCOUNTER — Telehealth: Payer: Self-pay | Admitting: Internal Medicine

## 2018-07-01 ENCOUNTER — Emergency Department (HOSPITAL_COMMUNITY): Payer: BLUE CROSS/BLUE SHIELD

## 2018-07-01 ENCOUNTER — Emergency Department (HOSPITAL_COMMUNITY)
Admission: EM | Admit: 2018-07-01 | Discharge: 2018-07-02 | Disposition: A | Discharge status: Home / Self Care | Payer: BLUE CROSS/BLUE SHIELD | Source: Home / Self Care | Attending: Emergency Medicine | Admitting: Emergency Medicine

## 2018-07-01 DIAGNOSIS — Z9861 Coronary angioplasty status: Secondary | ICD-10-CM | POA: Diagnosis not present

## 2018-07-01 DIAGNOSIS — Z87891 Personal history of nicotine dependence: Secondary | ICD-10-CM | POA: Insufficient documentation

## 2018-07-01 DIAGNOSIS — I10 Essential (primary) hypertension: Secondary | ICD-10-CM | POA: Diagnosis not present

## 2018-07-01 DIAGNOSIS — E785 Hyperlipidemia, unspecified: Secondary | ICD-10-CM

## 2018-07-01 DIAGNOSIS — R0789 Other chest pain: Secondary | ICD-10-CM

## 2018-07-01 DIAGNOSIS — I2510 Atherosclerotic heart disease of native coronary artery without angina pectoris: Secondary | ICD-10-CM

## 2018-07-01 DIAGNOSIS — R079 Chest pain, unspecified: Secondary | ICD-10-CM | POA: Diagnosis not present

## 2018-07-01 DIAGNOSIS — I4510 Unspecified right bundle-branch block: Secondary | ICD-10-CM | POA: Diagnosis not present

## 2018-07-01 DIAGNOSIS — Z79899 Other long term (current) drug therapy: Secondary | ICD-10-CM

## 2018-07-01 DIAGNOSIS — R7989 Other specified abnormal findings of blood chemistry: Secondary | ICD-10-CM

## 2018-07-01 DIAGNOSIS — Z955 Presence of coronary angioplasty implant and graft: Secondary | ICD-10-CM

## 2018-07-01 DIAGNOSIS — R778 Other specified abnormalities of plasma proteins: Secondary | ICD-10-CM

## 2018-07-01 DIAGNOSIS — R931 Abnormal findings on diagnostic imaging of heart and coronary circulation: Secondary | ICD-10-CM | POA: Diagnosis not present

## 2018-07-01 LAB — BASIC METABOLIC PANEL
ANION GAP: 8 (ref 5–15)
BUN: 16 mg/dL (ref 6–20)
CHLORIDE: 104 mmol/L (ref 98–111)
CO2: 28 mmol/L (ref 22–32)
Calcium: 9.3 mg/dL (ref 8.9–10.3)
Creatinine, Ser: 0.96 mg/dL (ref 0.61–1.24)
GFR calc Af Amer: >60 mL/min (ref >60)
GFR calc non Af Amer: >60 mL/min (ref >60)
Glucose, Bld: 109 mg/dL — ABNORMAL HIGH (ref 70–99)
POTASSIUM: 5.1 mmol/L (ref 3.5–5.1)
Sodium: 140 mmol/L (ref 135–145)

## 2018-07-01 LAB — CBC
HEMATOCRIT: 42.5 % (ref 39.0–52.0)
Hemoglobin: 14.2 g/dL (ref 13.0–17.0)
MCH: 30.1 pg (ref 26.0–34.0)
MCHC: 33.4 g/dL (ref 30.0–36.0)
MCV: 90.2 fL (ref 80.0–100.0)
Platelets: 265 10*3/uL (ref 150–400)
RBC: 4.71 MIL/uL (ref 4.22–5.81)
RDW: 12.3 % (ref 11.5–15.5)
WBC: 7.7 10*3/uL (ref 4.0–10.5)
nRBC: 0 % (ref 0.0–0.2)

## 2018-07-01 LAB — I-STAT TROPONIN, ED: Troponin i, poc: 0.14 ng/mL (ref 0.00–0.08)

## 2018-07-01 MED ORDER — ROSUVASTATIN CALCIUM 20 MG PO TABS
20.0000 mg | ORAL_TABLET | Freq: Every day | ORAL | Status: DC
  Administered 2018-07-01: 10:00:00 20 mg via ORAL
  Filled 2018-07-01: qty 1

## 2018-07-01 MED ORDER — SODIUM CHLORIDE 0.9% FLUSH: 3.0000 mL | Freq: Once | INTRAVENOUS | Status: DC

## 2018-07-01 MED ORDER — ROSUVASTATIN CALCIUM 20 MG PO TABS: 20.0000 mg | ORAL_TABLET | Freq: Every day | ORAL | 0 refills | Status: DC

## 2018-07-01 MED ORDER — NITROGLYCERIN 0.4 MG SL SUBL: 0.4000 mg | SUBLINGUAL_TABLET | SUBLINGUAL | 3 refills | Status: DC | PRN

## 2018-07-01 MED ORDER — ASPIRIN EC 81 MG PO TBEC: 81.0000 mg | DELAYED_RELEASE_TABLET | Freq: Every day | ORAL | 3 refills | Status: DC

## 2018-07-01 MED ORDER — AMLODIPINE BESYLATE 5 MG PO TABS: 5.0000 mg | ORAL_TABLET | Freq: Every day | ORAL | 1 refills | Status: DC

## 2018-07-01 MED ORDER — CLOPIDOGREL BISULFATE 75 MG PO TABS: 75.0000 mg | ORAL_TABLET | Freq: Every day | ORAL | 11 refills | Status: DC

## 2018-07-01 MED ORDER — AMLODIPINE BESYLATE 5 MG PO TABS
5.0000 mg | ORAL_TABLET | Freq: Every day | ORAL | Status: DC
  Administered 2018-07-01: 10:00:00 5 mg via ORAL
  Filled 2018-07-01: qty 1

## 2018-07-01 MED FILL — ROSUVASTATIN CALCIUM 20 MG: 20 | 30 days supply | Qty: 30 | Fill #0 | Status: TO

## 2018-07-01 MED FILL — AMLODIPINE BESYLATE 5 MG TA: 5 | 90 days supply | Qty: 90 | Fill #0 | Status: TO

## 2018-07-01 MED FILL — ASPIRIN LOW DOSE 81 MG TBEC: 81 | 90 days supply | Qty: 90 | Fill #0 | Status: TO

## 2018-07-01 MED FILL — CLOPIDOGREL 75 MG TABLET: 75 | 30 days supply | Qty: 30 | Fill #0 | Status: TO

## 2018-07-01 MED FILL — NITROGLYCERIN 0.4 MG TAB SL: 0.4 | 8 days supply | Qty: 25 | Fill #0 | Status: TO

## 2018-07-01 NOTE — Discharge Summary (Addendum)
Discharge Summary    Patient ID: Phillip [name],  MRN: [id number], DOB/AGE: [birth year] 63 y.o.  Admit date: 06/30/2018 Discharge date: 07/01/2018  Primary Care Provider: Howard Pouch A Primary Cardiologist: Dr. Ellyn Hack   Discharge Diagnoses    Principal Problem:   Abnormal cardiac CT angiography Active Problems:   Erectile dysfunction   Coronary artery calcification seen on CAT scan   Hyperlipidemia with target LDL less than 100   CAD S/P percutaneous coronary angioplasty   Abnormal computed tomography angiography (CTA)   Allergies Allergies  Allergen Reactions  . Penicillins Hives    Did it involve swelling of the face/tongue/throat, SOB, or low BP? Unknown Did it involve sudden or severe rash/hives, skin peeling, or any reaction on the inside of your mouth or nose? Yes Did you need to seek medical attention at a hospital or doctor's office? Unknown When did it last happen?Occurred at age 72 or 50 If all above answers are "NO", may proceed with cephalosporin use.   . Methylprednisolone Other (See Comments)    Unsure of exact reaction type  . Amphetamines Other (See Comments)    Intolerant to specific formulations: [employer] amphetamine TEVA dextroamp amphetamine  . Lorazepam Other (See Comments)    Mylan lorazepam; others ok  . Propofol Other (See Comments)    Cognitive delay and emotional    Diagnostic Studies/Procedures    Cath: 06/30/2018   Prox LAD to Mid LAD lesion is 80% stenosed. Mid LAD lesion is 70% stenosed.  A drug-eluting stent was successfully placed covering both lesions using a STENT SYNERGY DES 2.56L89 -postdilated to 3.0 mm.  Post intervention, there is a 0% residual stenosis for both lesions  ----------------------  Prox LAD lesion is 55% stenosed prior to1st Diag. Dist LAD lesion is 40% stenosed.  Mid Cx lesion is 60% stenosed.  Mid RCA-1 lesion is 30% stenosed. Mid RCA-2 lesion is 40% stenosed.  ----------------------  The left  ventricular systolic function is normal. The left ventricular ejection fraction is 55-65% by visual estimate.  Mid LAD lesion is 70% stenosed.   SUMMARY  Multivessel coronary artery disease involving severe tandem lesions in the proximal-mid LAD as well as moderate to severe lesions in the mid-distal Circumflex and moderate lesions in the mid RCA -> findings consistent with Coronary CTA.  The circumflex lesion did not seem as significant angiographically--more consistent with resistance in series. ? Successful DES PCI of the proximal to mid LAD with 1 single DES stent covering tandem lesions (Synergy 2.75 mm x 38 mm postdilated to 3.0 mm)  Normal LV size and function with normal EDP.  RECOMMENDATIONS  The patient does not have someone to stay with him over the night, therefore we will keep him here tonight for monitoring post PCI.  Increase pravastatin dose to 40 mg  No beta-blocker because of bradycardia, continue amlodipine  [name], MD _____________   History of Present Illness     63 y.o. male notable for HLD & Fam Hx of CAD who presented to the office with Dr. Ellyn Hack for invasive Coronary Evaluation with LHF-Cors & Possible PCI after having sequential evaluation from Coronary Calcium Score to Coronary CT Angiogram that showed significant LAD disease (+ FFR) as well as distal PL OM.    EFE [name] was last seen on Jan 8th by PCP to discuss results of screening Chest CT ordered by pulmonary medicine.  Evidence of "three-vessel CAD/calcification ".  Noted that he is working out and no complaints  of chest pain.  LDL is 133.  Does have family history of CAD albeit not premature & has a distant smoking history.  Well-controlled hypertension on low-dose amlodipine.  Amlodipine chosen because of history of Raynaud's syndrome -  He was seen by Dr. Ellyn Hack for initial consultation on Jan 15th -- he denied any notable CV symptoms (but on retrospect may have noted some DOE).  He  stated that he remained active doing yard work & walking "long-distances" 3-4 d/week. Given findings on coronary CT, he was set up for outpatient cardiac cath.   Hospital Course     Underwent cardiac cath noted above with successful PCI/DES to the mLAD, with mild non-obstructive disease in the RCA, LCx, and and 55% pLAD. Placed on DAPT with ASA/plavix for at least one year. No room for BB therapy as he is bradycardiac. Was on pravastatin prior to admission, but agreed to switch to Crestor 20mg  daily. Worked well with cardiac rehab without recurrent chest pain. Increased amlodipine to 5mg  daily. Asked that he monitor his blood pressures and bring to follow up.   General: Well developed, well nourished, male appearing in no acute distress. Head: Normocephalic, atraumatic.  Neck: Supple without bruits, JVD. Lungs:  Resp regular and unlabored, CTA. Heart: RRR, S1, S2, no S3, S4, or murmur; no rub. Abdomen: Soft, non-tender, non-distended with normoactive bowel sounds. No hepatomegaly. No rebound/guarding. No obvious abdominal masses. Extremities: No clubbing, cyanosis, edema. Distal pedal pulses are 2+ bilaterally. Right radial cath site stable without bruising or hematoma Neuro: Alert and oriented X 3. Moves all extremities spontaneously. Psych: Normal affect.  [name] was seen by Dr. Irish Lack and determined stable for discharge home. Follow up in the office has been arranged. Medications are listed below.   _____________  Discharge Vitals Blood pressure (!) 150/95, pulse 60, temperature (!) 97.5 F (36.4 C), temperature source Oral, resp. rate 14, height 6\' 1"  (1.854 m), weight 86.7 kg, SpO2 99 %.  Filed Weights   06/30/18 0638 07/01/18 0702  Weight: 86.2 kg 86.7 kg    Labs & Radiologic Studies    CBC Recent Labs    06/28/18 1118 07/01/18 0542  WBC 5.3 7.7  HGB 14.7 14.2  HCT 42.3 42.5  MCV 91 90.2  PLT 285 277   Basic Metabolic Panel Recent Labs    06/28/18 1117  07/01/18 0542  NA 141 140  K 5.1 5.1  CL 101 104  CO2 25 28  GLUCOSE 87 109*  BUN 14 16  CREATININE 0.95 0.96  CALCIUM 9.7 9.3   Liver Function Tests No results for input(s): AST, ALT, ALKPHOS, BILITOT, PROT, ALBUMIN in the last 72 hours. No results for input(s): LIPASE, AMYLASE in the last 72 hours. Cardiac Enzymes No results for input(s): CKTOTAL, CKMB, CKMBINDEX, TROPONINI in the last 72 hours. BNP Invalid input(s): POCBNP D-Dimer No results for input(s): DDIMER in the last 72 hours. Hemoglobin A1C No results for input(s): HGBA1C in the last 72 hours. Fasting Lipid Panel No results for input(s): CHOL, HDL, LDLCALC, TRIG, CHOLHDL, LDLDIRECT in the last 72 hours. Thyroid Function Tests No results for input(s): TSH, T4TOTAL, T3FREE, THYROIDAB in the last 72 hours.  Invalid input(s): FREET3 _____________  Ct Cardiac Scoring  Addendum Date: 06/09/2018   ADDENDUM REPORT: 06/09/2018 15:22 EXAM: OVER-READ INTERPRETATION  CT CHEST The following report is an over-read performed by radiologist Dr. Rebekah Chesterfield [facility] on 06/09/2018. This over-read does not include interpretation of cardiac or coronary anatomy or  pathology. The coronary calcium score interpretation by the cardiologist is attached. COMPARISON:  Chest CT 04/19/2018. FINDINGS: Aortic atherosclerosis. Within the visualized portions of the thorax there are no suspicious appearing pulmonary nodules or masses, there is no acute consolidative airspace disease, no pleural effusions, no pneumothorax and no lymphadenopathy. Visualized portions of the upper abdomen are unremarkable. There are no aggressive appearing lytic or blastic lesions noted in the visualized portions of the skeleton. IMPRESSION: 1.  Aortic Atherosclerosis (ICD10-I70.0). Electronically Signed   By: Vinnie Langton M.D.   On: 06/09/2018 15:22   Result Date: 06/09/2018 CLINICAL DATA:  Risk stratification EXAM: Coronary Calcium Score TECHNIQUE: The  patient was scanned on a Siemens Somatom 64 slice scanner. Axial non-contrast 3 mm slices were carried out through the heart. The data set was analyzed on a dedicated work station and scored using the [place]. FINDINGS: Non-cardiac: See separate report from [facility]. Ascending aorta: Dilated 4.2 cm Pericardium: Normal Coronary arteries: LM and 3 vessel calcium noted IMPRESSION: Coronary calcium score of 1168. This was 44 th percentile for age and sex matched control. Consider f/u perfusion study given how high calcium score is and f/u CTA of aorta in 6 months Jenkins Rouge Electronically Signed: By: Jenkins Rouge M.D. On: 06/09/2018 15:10   Ct Coronary Morph W/cta Cor W/score W/ca W/cm &/or Wo/cm  Addendum Date: 06/19/2018   ADDENDUM REPORT: 06/19/2018 15:25 CLINICAL DATA:  Chest pain EXAM: Cardiac CTA MEDICATIONS: Sub lingual nitro. 4mg  x 2 TECHNIQUE: The patient was scanned on a Siemens 161 slice scanner. Gantry rotation speed was 250 msecs. Collimation was 0.6 mm. A 100 kV prospective scan was triggered in the ascending thoracic aorta at 35-75% of the R-R interval. Average HR during the scan was 60 bpm. The 3D data set was interpreted on a dedicated work station using MPR, MIP and VRT modes. A total of 80cc of contrast was used. FINDINGS: Non-cardiac: See separate report from [facility]. Pulmonary veins drain normally to the left atrium. Calcium Score: 1318 Agatston units. Coronary Arteries: Right dominant with no anomalies LM: Mixed plaque with mild (<50%) stenosis. LAD system: Extensive mixed plaque in the proximal LAD. Possible moderate (51-69%) stenosis. Could be worse but blooming artifact from calcification makes quantification difficult. Moderate D1 with mild (<50%) proximal stenosis. Circumflex system: Calcified plaque in the proximal LCx, mild stenosis (<50%). Large PLOM with calcified plaque proximally, possible moderate (51-69%) stenosis. RCA system: Mixed plaque in the  mid RCA, possible moderate (51-69%) stenosis. IMPRESSION: 1.Coronary artery calcium score 1318 Agatston units. This places the patient in the 98th percentile for age and gender, suggesting high risk for future cardiac events. 2. Moderate coronary disease. Will send for FFR to assess for hemodynamic significance. Dalton Mclean Electronically Signed   By: Loralie Champagne M.D.   On: 06/19/2018 15:25   Result Date: 06/19/2018 EXAM: OVER-READ INTERPRETATION  CT CHEST The following report is an over-read performed by radiologist Dr. Abigail Miyamoto of [facility], [place] on 06/18/2018. This over-read does not include interpretation of cardiac or coronary anatomy or pathology. The coronary CTA interpretation by the cardiologist is attached. COMPARISON:  06/09/2018 calcium score. 04/19/2018 screening chest CT FINDINGS: Vascular: Aortic atherosclerosis. No dissection or aneurysm. No central pulmonary embolism, on this non-dedicated study. Mediastinum/Nodes: No imaged thoracic adenopathy. Lungs/Pleura: No imaged pleural fluid.  Clear imaged lungs. Upper Abdomen: Normal imaged portions of the liver, spleen. Musculoskeletal: Mild thoracic spondylosis. IMPRESSION: 1.  No acute findings in the imaged extracardiac chest. 2.  Aortic Atherosclerosis (ICD10-I70.0). Electronically Signed: By: Abigail Miyamoto M.D. On: 06/18/2018 13:22   Ct Coronary Fractional Flow Reserve Fluid Analysis  Result Date: 06/22/2018 CLINICAL DATA:  Chest pain EXAM: CT FFR MEDICATIONS: No additional medications TECHNIQUE: The coronary CTA was sent for FFR. FINDINGS: FFR mid RCA 0.84 FFR mid LAD 0.64 FFR PLOM 0.72 IMPRESSION: This study suggests hemodynamically significant stenosis in the proximal LAD and in the proximal PLOM. Dalton Mclean Electronically Signed   By: Loralie Champagne M.D.   On: 06/22/2018 13:33   Disposition   Pt is being discharged home today in good condition.  Follow-up Plans & Appointments    Follow-up Information    [name], MD Follow up on 07/14/2018.   Specialty:  Cardiology Why:  at 1:40pm for your follow up appt.  Contact information: [address] [place] [address] [phone]          Discharge Instructions    Amb Referral to Cardiac Rehabilitation   Complete by:  As directed    Diagnosis:  Coronary Stents   Diet - low sodium heart healthy   Complete by:  As directed    Discharge instructions   Complete by:  As directed    Radial Site Care Refer to this sheet in the next few weeks. These instructions provide you with information on caring for yourself after your procedure. Your caregiver may also give you more specific instructions. Your treatment has been planned according to current medical practices, but problems sometimes occur. Call your caregiver if you have any problems or questions after your procedure. HOME CARE INSTRUCTIONS You may shower the day after the procedure.Remove the bandage (dressing) and gently wash the site with plain soap and water.Gently pat the site dry.  Do not apply powder or lotion to the site.  Do not submerge the affected site in water for 3 to 5 days.  Inspect the site at least twice daily.  Do not flex or bend the affected arm for 24 hours.  No lifting over 5 pounds (2.3 kg) for 5 days after your procedure.  Do not drive home if you are discharged the same day of the procedure. Have someone else drive you.  You may drive 24 hours after the procedure unless otherwise instructed by your caregiver.  What to expect: Any bruising will usually fade within 1 to 2 weeks.  Blood that collects in the tissue (hematoma) may be painful to the touch. It should usually decrease in size and tenderness within 1 to 2 weeks.  SEEK IMMEDIATE MEDICAL CARE IF: You have unusual pain at the radial site.  You have redness, warmth, swelling, or pain at the radial site.  You have drainage (other than a small amount of blood on the dressing).  You have  chills.  You have a fever or persistent symptoms for more than 72 hours.  You have a fever and your symptoms suddenly get worse.  Your arm becomes pale, cool, tingly, or numb.  You have heavy bleeding from the site. Hold pressure on the site.   PLEASE DO NOT MISS ANY DOSES OF YOUR PLAVIX!!!!! Also keep a log of you blood pressures and bring back to your follow up appt. Please call the office with any questions.   Patients taking blood thinners should generally stay away from medicines like ibuprofen, Advil, Motrin, naproxen, and Aleve due to risk of stomach bleeding. You may take Tylenol as directed or talk to your primary doctor  about alternatives.   Increase activity slowly   Complete by:  As directed        Discharge Medications     Medication List    STOP taking these medications   metoprolol tartrate 50 MG tablet Commonly known as:  LOPRESSOR   pravastatin 20 MG tablet Commonly known as:  PRAVACHOL   testosterone cypionate 200 MG/ML injection Commonly known as:  DEPOTESTOSTERONE CYPIONATE     TAKE these medications   amLODipine 5 MG tablet Commonly known as:  NORVASC Take 1 tablet (5 mg total) by mouth daily. Needs office visit prior to anymore refills What changed:    medication strength  how much to take Notes to patient:  Lowers blood pressure Decreases chest pain  DOSE CHANGE: Increased to 5mg    amphetamine-dextroamphetamine 30 MG tablet Commonly known as:  ADDERALL Take 1-2 tablets by mouth daily. Start taking on:  August 01, 2018 What changed:    how much to take  when to take this  additional instructions   aspirin EC 81 MG tablet Take 1 tablet (81 mg total) by mouth daily. Notes to patient:  Blood thinner Prevents clotting in stent and heart attack   B-complex with vitamin C tablet Take 1 tablet by mouth daily. Notes to patient:  Supplement    clopidogrel 75 MG tablet Commonly known as:  PLAVIX Take 1 tablet (75 mg total) by mouth daily  with breakfast. Notes to patient:  Blood thinner Prevents clotting in stent and heart attack   CoQ10 100 MG Caps Take 100 mg by mouth 2 (two) times daily. Notes to patient:  Supplement    LORazepam 1 MG tablet Commonly known as:  ATIVAN Take 2 tablets (2 mg total) by mouth at bedtime. Notes to patient:  For anxiety and sleep    nitroGLYCERIN 0.4 MG SL tablet Commonly known as:  NITROSTAT Place 1 tablet (0.4 mg total) under the tongue every 5 (five) minutes as needed for chest pain.   NON FORMULARY Take 1 capsule by mouth daily. MK-7 Supplements   OVER THE COUNTER MEDICATION Take 1 capsule by mouth daily. Nitric oxide supplements   PROSTATE SUPPORT PO Take 1 capsule by mouth every evening. Prostate Plus   rosuvastatin 20 MG tablet Commonly known as:  CRESTOR Take 1 tablet (20 mg total) by mouth daily at 6 PM. Notes to patient:  Lowers cholesterol and triglycerides  REPLACES pravastatin (Pravachol)   SUPER OMEGA 3 EPA/DHA PO Take 1 capsule by mouth daily. Notes to patient:  Supplement    VITAMIN C PO Take 1 tablet by mouth 2 (two) times daily. Notes to patient:  Supplement    vitamin E 400 UNIT capsule Take 400 Units by mouth daily. Notes to patient:  Supplement       Acute coronary syndrome (MI, NSTEMI, STEMI, etc) this admission?: No.   Outstanding Labs/Studies   FLP/LFTs in 6 weeks with changing of statin  Duration of Discharge Encounter   Greater than 30 minutes including physician time.  Signed, Reino Bellis NP-C 07/01/2018, 10:57 AM  I have examined the patient and reviewed assessment and plan and discussed with patient.  Agree with above as stated.    GEN: Well nourished, well developed, in no acute distress  HEENT: normal  Neck: no JVD, carotid bruits, or masses Cardiac: RRR; no murmurs, rubs, or gallops,no edema  Respiratory:  clear to auscultation bilaterally, normal work of breathing GI: soft, nontender, nondistended,  MS: no deformity or  atrophy ;  no right radial hematoma Skin: warm and dry, no rash Neuro:  Strength and sensation are intact Psych: euthymic mood, full affect  Stressed importance of DAPT along with secondary prevention.     Larae Grooms

## 2018-07-01 NOTE — Progress Notes (Signed)
CARDIAC REHAB PHASE I   PRE:  Rate/Rhythm: 60 SR  BP:  Sitting: 145/86      SaO2: 96 RA  MODE:  Ambulation: 800 ft   POST:  Rate/Rhythm: 69 SR  BP:  Sitting: 157/96    SaO2: 98 RA  Pt ambulated 864ft in hallway independently with ease. Pt denies CP or SOB. Pt educated on importance of ASA, Plavix, and statin. Pt given stent card and heart healthy diet. Reviewed restrictions and exercise guidelines. Pt concerned over his blood pressure. Encouraged pt to take his blood pressure at home at different times of day and keep a log to take back to cardiologists office. Will refer to CRP II [place] Rufina Falco, RN BSN 07/01/2018 8:42 AM

## 2018-07-01 NOTE — Telephone Encounter (Signed)
Can you please ask him to call his cardiologist and specifically ask whether it is mandatory for him to be off testosterone?  If he is not allowed to be on testosterone, per his cardiologist recommendations, we cannot continue.

## 2018-07-01 NOTE — Telephone Encounter (Signed)
Per Sherrilee Gilles, patient Phillip [name] was released from hospital today after having a stent placed.  Dr Ellyn Hack did procedure   Per discharging doctor testosterone should not be taken by patient, but per patient he wants to talk with Dr Renne Crigler about his testosterone usage and how to proceed

## 2018-07-01 NOTE — ED Triage Notes (Signed)
Pt here with central chest pain that radiates to his back.  Had stent placed today then discharged from hospital pain free.  Pain started at 10pm and progressively getting worse.

## 2018-07-01 NOTE — ED Notes (Signed)
Patient transported to X-ray 

## 2018-07-02 ENCOUNTER — Telehealth: Payer: Self-pay | Admitting: Cardiology

## 2018-07-02 ENCOUNTER — Other Ambulatory Visit: Payer: Self-pay

## 2018-07-02 LAB — CBC
HCT: 44.7 % (ref 39.0–52.0)
Hemoglobin: 14.8 g/dL (ref 13.0–17.0)
MCH: 30.4 pg (ref 26.0–34.0)
MCHC: 33.1 g/dL (ref 30.0–36.0)
MCV: 91.8 fL (ref 80.0–100.0)
Platelets: 297 10*3/uL (ref 150–400)
RBC: 4.87 MIL/uL (ref 4.22–5.81)
RDW: 12.4 % (ref 11.5–15.5)
WBC: 8 10*3/uL (ref 4.0–10.5)
nRBC: 0 % (ref 0.0–0.2)

## 2018-07-02 LAB — BASIC METABOLIC PANEL
Anion gap: 7 (ref 5–15)
BUN: 17 mg/dL (ref 6–20)
CO2: 25 mmol/L (ref 22–32)
CREATININE: 0.98 mg/dL (ref 0.61–1.24)
Calcium: 9.2 mg/dL (ref 8.9–10.3)
Chloride: 103 mmol/L (ref 98–111)
GFR calc Af Amer: >60 mL/min (ref >60)
GFR calc non Af Amer: >60 mL/min (ref >60)
Glucose, Bld: 106 mg/dL — ABNORMAL HIGH (ref 70–99)
Potassium: 3.8 mmol/L (ref 3.5–5.1)
Sodium: 135 mmol/L (ref 135–145)

## 2018-07-02 MED ORDER — ASPIRIN 81 MG PO CHEW
324.0000 mg | CHEWABLE_TABLET | Freq: Once | ORAL | Status: AC
  Administered 2018-07-02: 324 mg via ORAL
  Filled 2018-07-02: qty 4

## 2018-07-02 MED ORDER — NITROGLYCERIN 0.4 MG SL SUBL
0.4000 mg | SUBLINGUAL_TABLET | SUBLINGUAL | Status: DC | PRN
  Administered 2018-07-02: 0.4 mg via SUBLINGUAL
  Filled 2018-07-02: qty 1

## 2018-07-02 NOTE — Telephone Encounter (Signed)
New Message:     Phillip [name] would like to talk to [name]. about pt's blood pressure please.. Pt went to [facility] ER yesterday.

## 2018-07-02 NOTE — Telephone Encounter (Signed)
Notified patient of message from Dr. Cruzita Lederer, patient expressed understanding and agreement. No further questions.  Patient will contact his cardiologist asap and let us know.

## 2018-07-02 NOTE — Telephone Encounter (Signed)
Spoke to patient and friend [name]. Both had question in regards to patient going to  ER  Last night. Patient stats blood pressure increased to 180/?  Develop chest pain .  Blood pressure  161 /93 today and recently 132/89.   Reassured patient  To monitor blood pressure  Will need a trend of blood pressure staying in the  180 's arrange. Per [name], patient was informed he could take an additional AMLODIPINE  5 MG  IF BLOOD pressure was elevated, arrange of above 180.   direction on how to use NTG tablets sublingual was given and verbalized understanding.  all question answered , may call back if needed. Verbalized understanding.

## 2018-07-02 NOTE — ED Provider Notes (Signed)
[facility] EMERGENCY DEPARTMENT Provider Note  CSN: [id number] Arrival date & time: 07/01/18 2316  Chief Complaint(s) Chest Pain  HPI Phillip [name] is a 63 y.o. male with a history of CAD status post stenting x2 in the LAD on 26 February who presents with chest pain.  Patient reports that he was checking his blood pressure earlier this evening just to see what it was and noted that it was elevated.  After this, he began having chest tightness.  The history is provided by the patient.  Chest Pain  Pain location:  Substernal area Pain quality: tightness   Pain radiates to:  Does not radiate Pain severity:  Moderate Onset quality:  Gradual Duration:  2 hours Timing:  Constant Progression:  Waxing and waning Chronicity:  New Context comment:  S/p DES x2 to LAD on 2/26 Relieved by:  Nothing Worsened by:  Nothing Associated symptoms: no altered mental status, no back pain, no cough, no diaphoresis, no fatigue, no fever, no nausea, no shortness of breath and no syncope   Risk factors: coronary artery disease and hypertension    Patient reports that the pain has improved since initial onset without intervention.  Past Medical History Past Medical History:  Diagnosis Date  . ADD (attention deficit disorder)    on adderrall managed by neurology  . Anxiety    on ativan managed by Neurology  . 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 Cellulitis   . Chronic traumatic encephalopathy    right frontal-temproal lobe  . Complication of anesthesia    " BRAIN FOG "  . Coronary artery disease   . Depression   . Erectile dysfunction 11/2016   follows with urology; sildenafil prescribed  . Meningoencephalitis 1994   [place]  . Pneumonia   . Primary hypogonadism in male   . Raynaud's disease 40/98/1191   As complication of RMSF  . Restless legs 09/13/2015  . [place]  spotted fever 2017  . TBI (traumatic brain injury) ([place]) 1997, 2009   "concussions"   Patient Active Problem List   Diagnosis Date Noted  . Abnormal cardiac CT angiography 06/30/2018  . CAD S/P percutaneous coronary angioplasty 06/30/2018  . Abnormal computed tomography angiography (CTA)   . Hyperlipidemia with target LDL less than 100 05/22/2018  . Coronary artery calcification seen on CAT scan 05/12/2018  . 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 Chronic prescription benzodiazepine use 09/14/2017  . Chronic insomnia 09/14/2017  . Accidental testosterone overdose 03/16/2017  . Erectile dysfunction 12/09/2016  . 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 Encephalopathy, traumatic 10/08/2015  . Raynaud's disease 10/08/2015  . 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 Anxiety   . Cognitive impairment 01/31/2013  . 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 ADD (attention deficit disorder) 03/10/2012   Home Medication(s) Prior to Admission medications   Medication Sig Start Date End Date Taking? Authorizing Provider  amLODipine (NORVASC) 5 MG tablet Take 1 tablet (5 mg total) by mouth daily. Needs office visit prior to anymore refills 07/01/18   Reino Bellis B, NP  amphetamine-dextroamphetamine (ADDERALL) 30 MG tablet Take 1-2 tablets by mouth daily. Patient taking differently: Take 30 mg by mouth 2 (two) times daily. Morning & afternoon. 08/01/18   Dohmeier, Asencion Partridge, MD  Ascorbic Acid (VITAMIN C PO) Take 1 tablet by mouth 2 (two) times daily.    [provider]  aspirin EC 81 MG tablet Take  1 tablet (81 mg total) by mouth daily. 07/01/18   Cheryln Manly, NP  B Complex-C (B-COMPLEX WITH VITAMIN C) tablet Take 1 tablet by mouth daily.    [provider]  clopidogrel (PLAVIX) 75 MG tablet Take 1 tablet (75 mg total) by mouth daily with breakfast. 07/01/18   Reino Bellis B, NP  Coenzyme Q10 (COQ10) 100 MG CAPS Take 100 mg by mouth 2 (two) times daily.    [provider]  LORazepam (ATIVAN) 1 MG tablet Take 2 tablets (2 mg total) by mouth at bedtime. 03/04/18   Dohmeier, Asencion Partridge, MD  Misc Natural Products (PROSTATE SUPPORT PO) Take 1 capsule by mouth every evening. Prostate Plus    [provider]  nitroGLYCERIN (NITROSTAT) 0.4 MG SL tablet Place 1 tablet (0.4 mg total) under the tongue every 5 (five) minutes as needed for chest pain. 07/01/18 07/01/19  Cheryln Manly, NP  NON FORMULARY Take 1 capsule by mouth daily. MK-7 Supplements    [provider]  Omega-3 Fatty Acids (SUPER OMEGA 3 EPA/DHA PO) Take 1 capsule by mouth daily.    [provider]  OVER THE COUNTER MEDICATION Take 1 capsule by mouth daily. Nitric oxide supplements    [provider]  rosuvastatin (CRESTOR) 20 MG tablet Take 1 tablet (20 mg total) by mouth daily at 6 PM. 07/01/18   Cheryln Manly, NP  vitamin E 400 UNIT capsule Take 400 Units by mouth daily.    [provider]                                                                                                                                    Past Surgical History Past Surgical History:  Procedure Laterality Date  . CORONARY STENT INTERVENTION N/A 06/30/2018   Procedure: CORONARY STENT INTERVENTION;  Surgeon: [name], MD;  Location: [facility];  Service: Cardiovascular;  Laterality: N/A;  LAD   . LEFT HEART CATH AND CORONARY ANGIOGRAPHY N/A 06/30/2018   Procedure: LEFT HEART CATH AND CORONARY ANGIOGRAPHY;  Surgeon: [name], MD;  Location: [facility];  Service: Cardiovascular;  Laterality: N/A;  . TEE WITHOUT CARDIOVERSION N/A 09/06/2015   Procedure: TRANSESOPHAGEAL ECHOCARDIOGRAM (TEE);  Surgeon: Jerline Pain, MD;  R/O endocarditis;  . TONSILLECTOMY  1965   Family History Family History  Problem Relation Age of Onset  . Hyperlipidemia Father   . Heart disease Maternal Grandfather 75  . Cancer Paternal Grandfather   . Mental illness  Mother   . Mental illness Brother     Social History Social History   Tobacco Use  . Smoking status: Former Smoker    Packs/day: 1.50    Years: 25.00    Pack years: 37.50    Types: Cigarettes    Last attempt to quit: 11/22/2014    Years since quitting: 3.6  . Smokeless tobacco: Never Used  .  Tobacco comment: Encouraged to remain smoke free  Substance Use Topics  . Alcohol use: No    Alcohol/week: 0.0 standard drinks  . Drug use: No   Allergies Penicillins; Methylprednisolone; Amphetamines; Lorazepam; and Propofol  Review of Systems Review of Systems  Constitutional: Negative for diaphoresis, fatigue and fever.  Respiratory: Negative for cough and shortness of breath.   Cardiovascular: Positive for chest pain. Negative for syncope.  Gastrointestinal: Negative for nausea.  Musculoskeletal: Negative for back pain.   All other systems are reviewed and are negative for acute change except as noted in the HPI  Physical Exam Vital Signs  I have reviewed the triage vital signs BP (!) 137/95   Pulse 98   Temp 98.2 F (36.8 C) (Oral)   Resp 18   SpO2 100%   Physical Exam Vitals signs reviewed.  Constitutional:      General: He is not in acute distress.    Appearance: He is well-developed. He is not diaphoretic.  HENT:     Head: Normocephalic and atraumatic.     Nose: Nose normal.  Eyes:     General: No scleral icterus.       Right eye: No discharge.        Left eye: No discharge.     Conjunctiva/sclera: Conjunctivae normal.     Pupils: Pupils are equal, round, and reactive to light.  Neck:     Musculoskeletal: Normal range of motion and neck supple.  Cardiovascular:     Rate and Rhythm: Normal rate and regular rhythm.     Heart sounds: No murmur. No friction rub. No gallop.   Pulmonary:     Effort: Pulmonary effort is normal. No respiratory distress.     Breath sounds: Normal breath sounds. No stridor. No rales.  Abdominal:     General: There is no distension.      Palpations: Abdomen is soft.     Tenderness: There is no abdominal tenderness.  Musculoskeletal:        General: No tenderness.  Skin:    General: Skin is warm and dry.     Findings: No erythema or rash.  Neurological:     Mental Status: He is alert and oriented to person, place, and time.     ED Results and Treatments Labs (all labs ordered are listed, but only abnormal results are displayed) Labs Reviewed  BASIC METABOLIC PANEL - Abnormal; Notable for the following components:      Result Value   Glucose, Bld 106 (*)    All other components within normal limits  I-STAT TROPONIN, ED - Abnormal; Notable for the following components:   Troponin i, poc 0.14 (*)    All other components within normal limits  CBC                                                                                                                         EKG  EKG Interpretation  Date/Time:  Thursday July 01 2018 23:24:10 EST Ventricular Rate:  72 PR Interval:  178 QRS Duration: 98 QT Interval:  372 QTC Calculation: 407 R Axis:   73 Text Interpretation:  Normal sinus rhythm Incomplete right bundle branch block Minimal voltage criteria for LVH, may be normal variant Borderline ECG No significant change was found Confirmed by Addison Lank [phone]) on 07/01/2018 11:44:30 PM      Radiology Dg Chest 2 View  Result Date: 07/02/2018 CLINICAL DATA:  Chest pain and hypertension tonight. Stent placed yesterday. EXAM: CHEST - 2 VIEW COMPARISON:  CT chest 06/18/2018 and 06/09/2018 FINDINGS: Normal heart size and pulmonary vascularity. Coronary artery stents. No focal airspace disease or consolidation in the lungs. No blunting of costophrenic angles. No pneumothorax. Mediastinal contours appear intact. IMPRESSION: No active cardiopulmonary disease. Electronically Signed   By: Lucienne Capers M.D.   On: 07/02/2018 00:00   Pertinent labs & imaging results that were available during my care of the patient  were reviewed by me and considered in my medical decision making (see chart for details).  Medications Ordered in ED Medications  sodium chloride flush (NS) 0.9 % injection 3 mL (has no administration in time range)  nitroGLYCERIN (NITROSTAT) SL tablet 0.4 mg (0.4 mg Sublingual Given 07/02/18 0020)  aspirin chewable tablet 324 mg (324 mg Oral Given 07/02/18 0024)                                                                                                                                    Procedures Procedures  (including critical care time)  Medical Decision Making / ED Course I have reviewed the nursing notes for this encounter and the patient's prior records (if available in EHR or on provided paperwork).    EKG without acute ischemic changes or evidence of pericarditis.  Troponin mildly elevated, likely secondary to catheterization.  Chest x-ray without evidence suggestive of pneumonia, pneumothorax, pneumomediastinum.  No abnormal contour of the mediastinum to suggest dissection. No evidence of acute injuries.  Doubt pulmonary embolism.  Presentation not classic for aortic dissection or esophageal perforation.  Rest of the labs are reassuring without leukocytosis or anemia.  No significant electrolyte derangements or renal sufficiency.  Dr. Elson Areas from Cardiology consulted who evaluated the patient in the emergency department.  He feels that his pain was likely due to anxiety versus demand ischemia from the high blood pressure.  Has low suspicion for other causes.  He cleared the patient for discharge home and instructed him to call the office in the morning.  He did not want any troponin trending at this time.    Final Clinical Impression(s) / ED Diagnoses Final diagnoses:  Atypical chest pain  Elevated troponin   Disposition: Discharge  Condition: Good  I have discussed the results, Dx and Tx plan with the patient who expressed understanding and agree(s) with the plan.  Discharge instructions discussed at great length. The patient was given strict return  precautions who verbalized understanding of the instructions. No further questions at time of discharge.    ED Discharge Orders    None       Follow Up: [place] [id number] [phone] Call today For close follow up     This chart was dictated using voice recognition software.  Despite best efforts to proofread,  errors can occur which can change the documentation meaning.   Fatima Blank, MD 07/02/18 [phone]

## 2018-07-02 NOTE — Discharge Instructions (Addendum)

## 2018-07-06 ENCOUNTER — Telehealth: Payer: Self-pay | Admitting: Adult Health

## 2018-07-06 ENCOUNTER — Other Ambulatory Visit: Payer: Self-pay | Admitting: Neurology

## 2018-07-06 ENCOUNTER — Telehealth: Payer: Self-pay | Admitting: Cardiology

## 2018-07-06 NOTE — Telephone Encounter (Signed)
Returned call to [name] left message on personal voice mail Dr.Harding's advice.

## 2018-07-06 NOTE — Telephone Encounter (Signed)
Patient recently had a stent done and was released from the hospital on 2/27, and they told him not to take his testosterone shots.  [name] is stating patient really needs to take them. She wants to know what does Dr. Ellyn Hack think about this, if he can start taking them again.

## 2018-07-06 NOTE — Telephone Encounter (Signed)
I have routed this request to Dr Dohmeier for review. The pt is due for the medication and [place] registry was verified.  

## 2018-07-06 NOTE — Telephone Encounter (Signed)
[name],Phillip has called for a refill for pt's  amphetamine-dextroamphetamine (ADDERALL) 30 MG tablet  [id number] - [place], [place] - [address]

## 2018-07-06 NOTE — Telephone Encounter (Signed)
Returned call to [name] she wanted to ask Dr.Harding if ok for patient to restart testosterone injections.Stated he has been taking since 2017.Advised I will send message to [place] for advice.

## 2018-07-06 NOTE — Telephone Encounter (Signed)
Hold off until seen in f/u.  We can discuss.  [name], MD

## 2018-07-07 MED ORDER — AMPHETAMINE-DEXTROAMPHETAMINE 30 MG PO TABS: 30.0000 mg | ORAL_TABLET | Freq: Every day | ORAL | 0 refills | Status: DC

## 2018-07-08 ENCOUNTER — Other Ambulatory Visit: Payer: Self-pay | Admitting: Neurology

## 2018-07-08 MED ORDER — AMPHETAMINE-DEXTROAMPHETAMINE 30 MG PO TABS: 30.0000 mg | ORAL_TABLET | Freq: Every day | ORAL | 0 refills | Status: DC

## 2018-07-08 NOTE — Telephone Encounter (Addendum)
Pt's  friend Karen/DPR called said the patient is out of the medication. RX reads not to refill prior to 3/29. She said he takes 2 day. Please call [place] to advise at [phone]

## 2018-07-08 NOTE — Telephone Encounter (Signed)
Apologize for the inconvience. I have resent the request to Dr [name] and will make sure it is sent for the patient today.

## 2018-07-09 NOTE — Progress Notes (Signed)
Transitions of Care Follow Up Call Note  Phillip [name] is an 63 y.o. male who presented to [facility] on 07/01/2018.  The patient had the following prescriptions filled at [facility]: NORVASC, PLAVIX, ASA ,NTG    []  Patient's prescriptions filled at the [facility] Trans[facility] were transferred to the following pharmacy:  [facility] [place]  [x]  Patient unable to be reached after calling three times and prescriptions filled at the [facility] Transitions of Care Pharmacy were transferred to preferred pharmacy found within their chart.   Mills Koller 07/09/2018, 5:28 PM Transitions of Care Pharmacy Hours: Monday - Friday 8:30am to 5:00 PM  Phone - [phone]

## 2018-07-14 ENCOUNTER — Ambulatory Visit: Payer: BLUE CROSS/BLUE SHIELD | Admitting: Cardiology

## 2018-07-14 ENCOUNTER — Other Ambulatory Visit: Payer: Self-pay

## 2018-07-14 ENCOUNTER — Encounter: Payer: Self-pay | Admitting: Cardiology

## 2018-07-14 VITALS — BP 142/84 | HR 78 | Ht 73.0 in | Wt 193.6 lb

## 2018-07-14 DIAGNOSIS — Z9861 Coronary angioplasty status: Secondary | ICD-10-CM

## 2018-07-14 DIAGNOSIS — E785 Hyperlipidemia, unspecified: Secondary | ICD-10-CM | POA: Diagnosis not present

## 2018-07-14 DIAGNOSIS — R4189 Other symptoms and signs involving cognitive functions and awareness: Secondary | ICD-10-CM | POA: Diagnosis not present

## 2018-07-14 DIAGNOSIS — R931 Abnormal findings on diagnostic imaging of heart and coronary circulation: Secondary | ICD-10-CM | POA: Diagnosis not present

## 2018-07-14 DIAGNOSIS — E230 Hypopituitarism: Secondary | ICD-10-CM | POA: Diagnosis not present

## 2018-07-14 DIAGNOSIS — I2510 Atherosclerotic heart disease of native coronary artery without angina pectoris: Secondary | ICD-10-CM | POA: Diagnosis not present

## 2018-07-14 MED ORDER — AMLODIPINE BESYLATE 10 MG PO TABS: 10.0000 mg | ORAL_TABLET | Freq: Every day | ORAL | 3 refills | Status: DC

## 2018-07-14 NOTE — Progress Notes (Signed)
PCP: Rossie Muskrat Remainder of care team       [name], MD Consulting Physician Rheumatology  04/11/16   [name], MD Consulting Physician Internal Medicine  11/13/16    Comment: Katherine Basset, MD Consulting Physician Urology  11/13/16   [name], NP Registered Nurse Neurology  11/13/16    Comment: Dr. Rush Landmark, [name], MD Consulting Physician Cardiology  11/13/16   [name], MD Consulting Physician Gastroenterology  12/10/16    Clinic Note: Chief Complaint  Patient presents with   Hospitalization Follow-up    Post-cath follow-up; multiple questions   Coronary Artery Disease    LAD PCI   HPI: Phillip [name] is a 63 y.o. male notable for HLD & Fam Hx of CAD who is being seen today for postop-cath/PCI follow-up after initial evaluation of Coronary Calcification on CT Scan at the request of Kuneff, Reinaldo Raddle, DO.  [name] was then here first on Jan 15th after referral by his PCP to discuss results of screening Chest CT ordered by pulmonary medicine -suggesting "three-vessel CAD/calcification ".  Was asymptomatic from a angina standpoint.  LDL is 133.  Does have family history of CAD albeit not premature.   Well-controlled hypertension on low-dose amlodipine.  Amlodipine chosen because of history of Raynaud's syndrome.  -->  I recommended coronary calcium score to determine the extent of coronary calcification.  This came back is highly positive and we therefore continued evaluation with a coronary CT angiogram that suggested significant LAD and circumflex disease.  He was therefore taken directly to catheterization lab after several telephone conversations with myself and Dorris Carnes who is a friend of a friend.  As it turned out he had been having some exertional dyspnea noticing some intermittent chest tightness that he had previously sort of "blown off".  He does have some memory issues and therefore did not  necessarily remember anything in specific detail.  He underwent cardiac catheterization on February 26 with PCI to LAD reviewed below.  Recent Hospitalizations:   Cath-PCI February 26th  ER visit on February 27 for atypical chest pain.  Was noted to be hypertensive.  Ruled out for MI.  Discharged home  Studies Personally Reviewed - (if available, images/films reviewed: From Epic Chart or Care Everywhere)  Coronary Calcium Score June 09, 2018: Score 1168.  Very high risk.  Referred for coronary CTA (also noted aortic atherosclerosis)  Coronary CTA -June 21, 2018: Cardiac cath score read 1318.  Moderate coronary disease sent for FFR: mRCA 0.84 (non-significant), mLAD 0.64 (signifiant), pLOM(Cx) - 0,72 (significant - but distal lesion);  Cardiac Cath  06/30/2018:  pLAD1 55% (not significant), p-mLAD2 80% & mLAD 70% (tandem lesions). mCx ~60%, mRCA 30% & 40%.  ER 55-60%.  p-mLAD2 80% & mLAD 70% (tandem lesions) - DES PCI Synergy DES 2.75 x 38 (3.0 mm).  mCx lesion & pLAD1 lesions - recommend Medical Management. Diagnostic        Intervention          Interval History: Phillip [name] presents here today post-cath overall doing quite well, but he is accompanied by 2 male friends and between he and his friends, they have multiple questions.  He has his coronary CTA, echo and cath reports with him asking multiple questions.  We discussed the cath findings reviewing images together.  He indicated that he really is not having any chest pain or pressure.  He is having some anxiety about these findings, but  has not necessarily noted any anginal symptoms.  He is concerned about how quickly get into exercise because of the cardiac rehab people telling him he needed to go slowly.  He is already working out 40 minutes a day with walking, having skipped the initial few stages. 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  Without it, he feels quite fatigued and tired.  He had some atypical chest pain that caused him to go to the emergency room, but he indicates that is probably just anxiety related having been told that he had coronary disease was quite stressful thing for him. He is not having any more episodes of chest pain or pressure with rest or exertion.  No exertional dyspnea.  No rapid irregular heartbeats palpitations.  No heart failure symptoms of PND, orthopnea or edema.  No TIA or amaurosis fugax or syncope/near syncope symptoms.  He does have mild bruising on Plavix, but is otherwise doing fine.  We also reviewed his lipids and other medical management options.  I decided not to use metoprolol because of bradycardia and fatigue issues in the past.  Continuing amlodipine only.  No claudication.  ROS: A comprehensive was performed. Review of Systems  Constitutional: Positive for malaise/fatigue (relating to no Testosterone supplementation). Negative for chills (Occasional chills and hot flashes), fever and weight loss.  HENT: Negative for congestion and nosebleeds.   Respiratory: Negative for cough, shortness of breath and wheezing.   Gastrointestinal: Negative for abdominal pain, blood in stool, heartburn and melena.  Genitourinary: Negative for hematuria.  Musculoskeletal: Negative for joint pain and myalgias.  Skin: Negative.   Neurological: Negative for dizziness, focal weakness, weakness and headaches.  Psychiatric/Behavioral: Negative for memory loss. The patient is nervous/anxious (Was already anxious before coming for initial visit - now even more anxious). The patient does not have insomnia.   All other systems reviewed and are negative.  I have reviewed and (if needed) personally updated the patient's problem list, medications, allergies, past medical and surgical history, social and family history.   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 Synergy DES 2.75 x 38 -- 3.0 mm).  mCx ~60% - med Rx, mRCA 30% & 40%.  ER 55-60%.   Cellulitis    Chronic traumatic encephalopathy    right frontal-temproal lobe   Complication of anesthesia    " BRAIN FOG "   Depression    Erectile dysfunction 11/2016   follows with urology; sildenafil prescribed   Meningoencephalitis 1994   [place]   Pneumonia    Primary hypogonadism in male    Raynaud's disease 72/53/6644   As complication of RMSF   Restless legs 09/13/2015   [facility] spotted fever 2017   TBI (traumatic brain injury) ([place]) 1997, 2009   "concussions"  -- RAYNAUD'S SYNDROME  Past Surgical History:  Procedure Laterality Date   CORONARY STENT INTERVENTION N/A 06/30/2018   Procedure: CORONARY STENT INTERVENTION;  Surgeon: [name],  MD;  Location: [facility] IN[facility];;  p-mLAD2 80% & mLAD 70% (tandem lesions) - DES PCI Synergy DES 2.75 x 38 (3.0 mm).   CT CTA CORONARY W/CA SCORE W/CM &/OR WO/CM  06/2018   Cardiac cath score read 1318.  Moderate coronary disease sent for FFR: mRCA 0.84 (non-significant), mLAD 0.64 (signifiant), pLOM(Cx) - 0,72 (significant - but distal lesion)   LEFT HEART CATH AND CORONARY ANGIOGRAPHY N/A 06/30/2018   Procedure: LEFT HEART CATH AND CORONARY ANGIOGRAPHY;  Surgeon: [name], MD;  Location: [facility] INVASIVE CV LAB;;  pLAD1 55% (not significant), p-mLAD2 80% & mLAD 70% (tandem lesions -> DES PCI). mCx ~60% (med Rx), mRCA 30% & 40%.  ER 55-60%.    TEE WITHOUT CARDIOVERSION N/A 09/06/2015   Procedure: TRANSESOPHAGEAL ECHOCARDIOGRAM (TEE);  Surgeon: Jerline Pain, MD;  R/O endocarditis;   TONSILLECTOMY  1965    Current Meds  Medication Sig   amphetamine-dextroamphetamine (ADDERALL) 30 MG tablet Take 1-2 tablets by mouth daily.   Ascorbic Acid (VITAMIN C PO) Take 1 tablet by mouth 2 (two) times daily.   aspirin EC 81 MG tablet Take 1 tablet (81 mg total) by mouth daily.   B Complex-C (B-COMPLEX WITH VITAMIN C) tablet Take 1 tablet by mouth daily.   clopidogrel (PLAVIX) 75 MG tablet Take 1 tablet (75 mg total) by mouth daily with breakfast.   Coenzyme Q10 (COQ10) 100 MG CAPS Take 100 mg by mouth 2 (two) times daily.   LORazepam (ATIVAN) 1 MG tablet Take 2 tablets (2 mg total) by mouth at bedtime.   Misc Natural Products (PROSTATE SUPPORT PO) Take 1 capsule by mouth every evening. Prostate Plus   NON FORMULARY Take 1 capsule by mouth daily. MK-7 Supplements   Omega-3 Fatty Acids (SUPER OMEGA 3 EPA/DHA PO) Take 1 capsule by mouth daily.   OVER THE COUNTER MEDICATION Take 1 capsule by mouth daily. Nitric oxide supplements   rosuvastatin (CRESTOR) 20 MG tablet Take 1 tablet (20 mg total) by mouth daily at 6 PM. (Patient taking differently: Take 20 mg by mouth daily. )   vitamin E 400 UNIT capsule Take 400 Units by mouth daily.   [DISCONTINUED] amLODipine (NORVASC) 5 MG tablet Take 1 tablet (5 mg total) by mouth daily. Needs office visit prior to anymore refills    Allergies  Allergen Reactions   Penicillins Hives    Did it involve swelling of the face/tongue/throat, SOB, or low BP? Unknown Did it involve sudden or severe rash/hives, skin peeling, or any reaction on the inside of your mouth or nose? Yes Did you need to seek medical attention at a hospital or doctor's office? Unknown When did it last happen?Occurred at age 82 or 57 If all above answers are NO, may proceed with cephalosporin use.    Methylprednisolone Other (See Comments)    Unsure of exact reaction type   Amphetamines Other (See Comments)    Intolerant to  specific formulations: [employer] amphetamine TEVA dextroamp amphetamine   Lorazepam Other (See Comments)    Mylan lorazepam; others ok   Propofol Other (See Comments)    Cognitive delay and emotional    Social History   Tobacco Use   Smoking status: Former Smoker    Packs/day: 1.50    Years: 25.00    Pack years: 37.50    Types: Cigarettes    Last attempt to quit: 11/22/2014    Years since quitting: 3.6   Smokeless tobacco: Never Used   Tobacco comment:  Encouraged to remain smoke free  Substance Use Topics   Alcohol use: No    Alcohol/week: 0.0 standard drinks   Drug use: No   Social History   Social History Narrative   Divorced. B.A. degree. Retired.   Drinks caffeine, uses herbal remedies, takes a daily vitamin.   Wears his seatbelt.  Smoke detector in the home.   Firearms locked in the home.   Feels safe in relationships.     family history includes Cancer in his paternal grandfather; Heart disease (age of onset: 28) in his maternal grandfather; Hyperlipidemia in his father; Mental illness in his brother and mother.  Wt Readings from Last 3 Encounters:  07/14/18 193 lb 9.6 oz (87.8 kg)  07/02/18 191 lb 2.2 oz (86.7 kg)  07/01/18 191 lb 2.2 oz (86.7 kg)    PHYSICAL EXAM BP (!) 142/84 (BP Location: Right Arm)    Pulse 78    Ht 6\' 1"  (1.854 m)    Wt 193 lb 9.6 oz (87.8 kg)    SpO2 99%    BMI 25.54 kg/m  Physical Exam  Constitutional: He is oriented to person, place, and time. He appears well-developed and well-nourished. No distress.  Healthy-appearing.  Well-groomed  HENT:  Head: Normocephalic and atraumatic.  Mouth/Throat: Oropharynx is clear and moist.  Eyes: No scleral icterus.  Neck: Normal range of motion. Neck supple. No hepatojugular reflux and no JVD present. Carotid bruit is not present. No tracheal deviation present. No thyromegaly present.  Cardiovascular: Normal rate, regular rhythm, normal heart sounds and intact distal pulses.  No  extrasystoles are present. PMI is not displaced. Exam reveals no gallop and no friction rub.  No murmur heard. Pulmonary/Chest: Breath sounds normal. No respiratory distress. He has no wheezes. He has no rales.  Abdominal: Soft. Bowel sounds are normal. He exhibits no distension. There is no abdominal tenderness. There is no rebound.  Musculoskeletal: Normal range of motion.        General: No edema.     Comments: Radial cath site has some mild tenderness, but no residual bruising or ecchymosis.  Normal Allen's test.  Neurological: He is alert and oriented to person, place, and time. No cranial nerve deficit.  Psychiatric: He has a normal mood and affect. His behavior is normal. Judgment and thought content normal.  Vitals reviewed.    Adult ECG Report None  Other studies Reviewed: Additional studies/ records that were reviewed today include:  Recent Labs:   Lab Results  Component Value Date   CREATININE 0.98 07/01/2018   BUN 17 07/01/2018   NA 135 07/01/2018   K 3.8 07/01/2018   CL 103 07/01/2018   CO2 25 07/01/2018   Lab Results  Component Value Date   CHOL 211 (H) 12/15/2017   HDL 61.60 12/15/2017   LDLCALC 133 (H) 12/15/2017   TRIG 83.0 12/15/2017   CHOLHDL 3 12/15/2017    ASSESSMENT / PLAN: Problem List Items Addressed This Visit    Abnormal cardiac CT angiography   CAD S/P percutaneous coronary angioplasty - Primary (Chronic)    Findings were pretty similar to what was seen on coronary CTA.  The mid LAD lesions and tandem were relatively significant and I therefore chose to treat these with a DES stent covering both lesions.  The upstream lesion did not seem to be flow-limiting nor did the circumflex lesion.  I chose to treat these medically.  We spent a long time discussing the results and reviewing the images. Plan for  now will be to continue aspirin and Plavix release 6 months at which time we can stop aspirin.  We will continue Plavix for at least 1 if not 2 years  with it being okay to hold Plavix for procedures after August 2020.  He is on amlodipine which was increased to 10 mg a day.  Avoiding beta-blocker because of bradycardia in the Cath Lab.  We will need to see how his heart rate pans out.  He is been having some fatigue issues so I do not confuse that. He is on rosuvastatin which my plan was to increase pending relook labs.      Relevant Medications   amLODipine (NORVASC) 10 MG tablet   Cognitive impairment    Phillip [name] does not have great recall of memory and has a hard time remembering what he is asked him what he is not asked.  He therefore has 2 friends with him both which have multiple questions.  As a result there were several questions being asked and answered.  Different reports were reviewed and notes are being taken.  This was a long process of ensuring that all questions were asked and answered and the appropriate way that he would then be able to process later on.      Coronary artery disease involving native coronary artery of native heart without angina pectoris (Chronic)    Residual nontreated lesions in the LAD and circumflex with plans to treat medically.  Increase amlodipine to 10 mg for antianginal effects.  Continue antiplatelet agents per PCI with their recommendations. On statin with plans to potentially titrate higher.      Relevant Medications   amLODipine (NORVASC) 10 MG tablet   Other Relevant Orders   EKG 12-Lead (Completed)   Lipid panel   Comprehensive metabolic panel   Hyperlipidemia with target LDL less than 70 (Chronic)    He was started on rosuvastatin when his labs were checked in August showing LDL of 133.  Goal now is an LDL less than 70 close to 50.  He should be due to have labs checked soon.  He has definitely adjusted his diet some hoping that we will see a beneficial result.  If not would probably increase rosuvastatin dose.    He is also on omega-3 fatty acids.      Relevant Medications   amLODipine  (NORVASC) 10 MG tablet   Other Relevant Orders   Lipid panel   Comprehensive metabolic panel   Hypogonadotropic hypogonadism (HCC) (Chronic)    He asked about potentially go back on testosterone replacement therapy for hypogonadism.  I think this is probably fine.  The goal here is actually hormone replacement not supplementation.  Target is therapeutic range and not supratherapeutic ranges.  In this regard, I think it is more of a physiologic requirement to replace as opposed to suggestion.  Will defer supplementation to his endocrinologist.         I spent a total of 55-60 minutes with the patient and chart review / annotation. >  50% of the time was spent in direct patient consultation. We reviewed both his coronary CTA and then cardiac catheterization findings and images using the HEART FLOW APP, and Syn[employer] imaging of the cath films.  I reviewed the findings not only of the lesions treated with stents but also the more proximal LAD and circumflex lesions which I chose to treat medically.  He and his friends were very happy about reviewing this data.  All questions were  answered including his questions about testosterone.  With multiple questions, this visit lasted close to an hour.  Current medicines are reviewed at length with the patient today.  (+/- concerns) questions about statin, questions about testosterone supplementation The following changes have been made:  -Continue statin, increase amlodipine, okay to restart testosterone supplementation per endocrinology.  Patient Instructions  Medication Instructions:  INCREASE MEDICATION -- AMLODIPINE 10 MG ONE TABLET DAILY  If you need a refill on your cardiac medications before your next appointment, please call your pharmacy.   Lab work: NEED LABS IN MAY June 2020 WILL MAIL LABSLIP IN MAY 2020 If you have labs (blood work) drawn today and your tests are completely normal, you will receive your results only by:  [place] (if  you have MyChart) OR  A paper copy in the mail If you have any lab test that is abnormal or we need to change your treatment, we will call you to review the results.  Testing/Procedures: NOT NEEDED  Follow-Up: At [facility] you and your health needs are our priority.  As part of our continuing mission to provide you with exceptional heart care, we have created designated Provider Care Teams.  These Care Teams include your primary Cardiologist (physician) and Advanced Practice Providers (APPs -  Physician Assistants and Nurse Practitioners) who all work together to provide you with the care you need, when you need it. You will need a follow up appointment in June OR July 2020.  Please call our office 2 months in advance to schedule this appointment.  You may see [name], MD or one of the following Advanced Practice Providers on your designated Care Team:   Rosaria Ferries, PA-C  Jory Sims, DNP, ANP  Any Other Special Instructions Will Be Listed Below (If Applicable).  , OKAY TO RESTART TESTERONE BUT YOU WILL HAVE DISCUSS WITH DR Renne Crigler-   Studies Ordered:   Orders Placed This Encounter  Procedures   Lipid panel   Comprehensive metabolic panel   EKG 31-DVVO      [name], M.D., M.S. Interventional Cardiologist   Pager # [phone] Phone # [phone] [address] [place], [place] [address]   Thank you for choosing Heartcare at [facility]!!

## 2018-07-14 NOTE — Patient Instructions (Addendum)
Medication Instructions:  INCREASE MEDICATION -- AMLODIPINE 10 MG ONE TABLET DAILY  If you need a refill on your cardiac medications before your next appointment, please call your pharmacy.   Lab work: NEED LABS IN MAY June 2020 WILL MAIL LABSLIP IN MAY 2020 If you have labs (blood work) drawn today and your tests are completely normal, you will receive your results only by: [facility] MyChart Message (if you have MyChart) OR . A paper copy in the mail If you have any lab test that is abnormal or we need to change your treatment, we will call you to review the results.  Testing/Procedures: NOT NEEDED  Follow-Up: At [facility], you and your health needs are our priority.  As part of our continuing mission to provide you with exceptional heart care, we have created designated Provider Care Teams.  These Care Teams include your primary Cardiologist (physician) and Advanced Practice Providers (APPs -  Physician Assistants and Nurse Practitioners) who all work together to provide you with the care you need, when you need it. You will need a follow up appointment in June OR July 2020.  Please call our office 2 months in advance to schedule this appointment.  You may see [name], MD or one of the following Advanced Practice Providers on your designated Care Team:   Rosaria Ferries, PA-C . Jory Sims, DNP, ANP  Any Other Special Instructions Will Be Listed Below (If Applicable).  , OKAY TO RESTART TESTERONE BUT YOU WILL HAVE DISCUSS WITH DR Renne Crigler-

## 2018-07-15 ENCOUNTER — Telehealth: Payer: Self-pay | Admitting: Cardiology

## 2018-07-15 NOTE — Telephone Encounter (Signed)
Pt had a stent put in and he is having a sharp pain on his right side. Pain starts in his ear and descends to the top of his collarbone. Has only had this pain since stent. Site is tender and sore to the touch.Noticed for the first time this morning when he woke up.   Wife wants to know if the pain is common after a stent or if it is a cause for concern.   Woke up at 3am in a heavy sweat. Had some alcohol earlier the evening and wondered if the sweats were caused from the alcohol or not

## 2018-07-15 NOTE — Telephone Encounter (Signed)
Spoke with patient and he stated this morning around 3 am he woke up in a sweat that lasted about 10 minutes. He then felt ok and went back to sleep. He woke up this am with pain from his right ear to his collarbone that hurst worse with movement. Patient stated several time it felt like pain was in the muscle. When he presses on the site it is sore to touch. Patient denies any chest pain or shortness of breath. No fever or chills and stated last night first night he had the sweating. Advised patient this did not sound cardiac related if no improvement contact PCP, verbalized understanding. Will forward to Dr Ellyn Hack for review.

## 2018-07-16 ENCOUNTER — Telehealth (HOSPITAL_COMMUNITY): Payer: Self-pay

## 2018-07-16 NOTE — Telephone Encounter (Signed)
Attempted to call patient in regards to Cardiac Rehab - LM on VM 

## 2018-07-16 NOTE — Telephone Encounter (Signed)
Pt insurance is active and benefits verified through [place]. Co-pay $0.00, DED $250.00/$250.00 met, out of pocket $650.00/$650.00 met, co-insurance 30%. No pre-authorization required. Passport, 07/16/2018 @ 3:34PM, REF# [id number]  Will contact patient to see if he is interested in the Cardiac Rehab Program. If interested, patient will need to complete follow up appt. Once completed, patient will be contacted for scheduling upon review by the RN Navigator.

## 2018-07-17 NOTE — Telephone Encounter (Signed)
This does not sound cardiac.  Agree with PCP evaluation.  [name], MD

## 2018-07-18 ENCOUNTER — Encounter: Payer: Self-pay | Admitting: Cardiology

## 2018-07-18 DIAGNOSIS — I25119 Atherosclerotic heart disease of native coronary artery with unspecified angina pectoris: Secondary | ICD-10-CM | POA: Insufficient documentation

## 2018-07-18 DIAGNOSIS — I2510 Atherosclerotic heart disease of native coronary artery without angina pectoris: Secondary | ICD-10-CM | POA: Insufficient documentation

## 2018-07-18 NOTE — Assessment & Plan Note (Signed)
Residual nontreated lesions in the LAD and circumflex with plans to treat medically.  Increase amlodipine to 10 mg for antianginal effects.  Continue antiplatelet agents per PCI with their recommendations. On statin with plans to potentially titrate higher.

## 2018-07-18 NOTE — Assessment & Plan Note (Signed)
Findings were pretty similar to what was seen on coronary CTA.  The mid LAD lesions and tandem were relatively significant and I therefore chose to treat these with a DES stent covering both lesions.  The upstream lesion did not seem to be flow-limiting nor did the circumflex lesion.  I chose to treat these medically.  We spent a long time discussing the results and reviewing the images. Plan for now will be to continue aspirin and Plavix release 6 months at which time we can stop aspirin.  We will continue Plavix for at least 1 if not 2 years with it being okay to hold Plavix for procedures after August 2020.  He is on amlodipine which was increased to 10 mg a day.  Avoiding beta-blocker because of bradycardia in the Cath Lab.  We will need to see how his heart rate pans out.  He is been having some fatigue issues so I do not confuse that. He is on rosuvastatin which my plan was to increase pending relook labs.

## 2018-07-18 NOTE — Assessment & Plan Note (Signed)
Markail does not have great recall of memory and has a hard time remembering what he is asked him what he is not asked.  He therefore has 2 friends with him both which have multiple questions.  As a result there were several questions being asked and answered.  Different reports were reviewed and notes are being taken.  This was a long process of ensuring that all questions were asked and answered and the appropriate way that he would then be able to process later on.

## 2018-07-18 NOTE — Assessment & Plan Note (Signed)
He asked about potentially go back on testosterone replacement therapy for hypogonadism.  I think this is probably fine.  The goal here is actually hormone replacement not supplementation.  Target is therapeutic range and not supratherapeutic ranges.  In this regard, I think it is more of a physiologic requirement to replace as opposed to suggestion.  Will defer supplementation to his endocrinologist.

## 2018-07-18 NOTE — Assessment & Plan Note (Signed)
He was started on rosuvastatin when his labs were checked in August showing LDL of 133.  Goal now is an LDL less than 70 close to 50.  He should be due to have labs checked soon.  He has definitely adjusted his diet some hoping that we will see a beneficial result.  If not would probably increase rosuvastatin dose.    He is also on omega-3 fatty acids.

## 2018-07-19 NOTE — Telephone Encounter (Signed)
Left message on voice mail- Dr Ellyn Hack in agreement of the advice given last week -  F/U with primary if no improvement

## 2018-07-22 ENCOUNTER — Telehealth: Payer: Self-pay | Admitting: Neurology

## 2018-07-22 NOTE — Telephone Encounter (Signed)
Called the patient and spoke with them. Per out med list the patient would not be due for a refill on adderall until 4/2 at the earliest. The earliest we could refill the ativan is 3/30. Dr Dohmeier is agreeable to sending a 3 mth supply in for the patient on both medications. Advised that insurance may not cover 3 mths at a time and that will be up to insurance discretion.  Pt's friend and pt verbalized understanding.

## 2018-07-22 NOTE — Telephone Encounter (Signed)
Pt's friend [name] (on [place]) is wanting to know what options there is for the pt to be able to receive 2-3 months of his medication at a time due to the COVID-19. The medications she is worried about are LORazepam (ATIVAN) 1 MG tablet and amphetamine-dextroamphetamine (ADDERALL) 30 MG tablet Please advise.

## 2018-07-23 NOTE — Telephone Encounter (Signed)
Attempted to call patient in regards to Cardiac Rehab - to let pt know we are closed at this time due to the COVID-19 and will contact once we have resume scheduling. LMTCB °Gloria W. Support Rep II ° °

## 2018-07-27 ENCOUNTER — Telehealth: Payer: Self-pay | Admitting: Cardiology

## 2018-07-27 NOTE — Telephone Encounter (Signed)
Cell # [phone] pt's cell phone     Pt's caregiver aware of Dr Allison Quarry recommendations and to expect phone call from Pharmacists .Phillip [name]

## 2018-07-27 NOTE — Telephone Encounter (Signed)
Pt has gotten progressively more confused over the past week or two, since starting the statin. He has a previous brain injury, so there is already mild confusion. His caregiver has noticed a change and more extreme confusion than normal. Caregiver wants to know what to do to reduce confusion

## 2018-07-27 NOTE — Telephone Encounter (Signed)
Lets stop the statin for now -- will need to see if we can have him discuss other cholesterol options with our Pharmacist team.    Will ask one of my Pharm-D's to call to discuss other options.  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  Probably needs to avoid statins - noting confusion with statin.  Can u initial PCSK9-I talk with him.  [name] c

## 2018-07-30 NOTE — Telephone Encounter (Signed)
Pt has not heard from either [place] or the pharmacy. He is concerned about what to do about his medication

## 2018-08-02 NOTE — Telephone Encounter (Signed)
Spoke with operator, had patient on phone - patient aware we will call him later this week

## 2018-08-02 NOTE — Telephone Encounter (Signed)
Pt still has not heard from anyone about the drug to take in place of Crestor.

## 2018-08-03 ENCOUNTER — Other Ambulatory Visit: Payer: Self-pay | Admitting: Neurology

## 2018-08-03 NOTE — Telephone Encounter (Signed)
I have routed this request to Dr Dohmeier for review. The pt is due for the medication and [place] registry was verified.  

## 2018-08-03 NOTE — Telephone Encounter (Signed)
Pt's friend [name] request refill for amphetamine-dextroamphetamine (ADDERALL) 30 MG tablet ( 1 mth supply) and LORazepam (ATIVAN) 1 MG tablet (several month refills) [facility]

## 2018-08-04 MED ORDER — AMPHETAMINE-DEXTROAMPHETAMINE 30 MG PO TABS: 30.0000 mg | ORAL_TABLET | Freq: Every day | ORAL | 0 refills | Status: DC

## 2018-08-04 MED ORDER — LORAZEPAM 1 MG PO TABS: 2.0000 mg | ORAL_TABLET | Freq: Every day | ORAL | 5 refills | Status: DC

## 2018-08-11 ENCOUNTER — Ambulatory Visit: Payer: BLUE CROSS/BLUE SHIELD | Admitting: Family Medicine

## 2018-08-11 ENCOUNTER — Other Ambulatory Visit: Payer: Self-pay

## 2018-08-11 ENCOUNTER — Encounter: Payer: Self-pay | Admitting: Family Medicine

## 2018-08-11 ENCOUNTER — Ambulatory Visit (INDEPENDENT_AMBULATORY_CARE_PROVIDER_SITE_OTHER): Payer: BLUE CROSS/BLUE SHIELD | Admitting: Family Medicine

## 2018-08-11 VITALS — BP 134/72 | HR 86 | Temp 96.8°F | Ht 73.0 in | Wt 195.0 lb

## 2018-08-11 DIAGNOSIS — Z9861 Coronary angioplasty status: Secondary | ICD-10-CM

## 2018-08-11 DIAGNOSIS — I10 Essential (primary) hypertension: Secondary | ICD-10-CM | POA: Diagnosis not present

## 2018-08-11 DIAGNOSIS — E785 Hyperlipidemia, unspecified: Secondary | ICD-10-CM

## 2018-08-11 DIAGNOSIS — Z789 Other specified health status: Secondary | ICD-10-CM

## 2018-08-11 DIAGNOSIS — I7300 Raynaud's syndrome without gangrene: Secondary | ICD-10-CM | POA: Diagnosis not present

## 2018-08-11 DIAGNOSIS — I2510 Atherosclerotic heart disease of native coronary artery without angina pectoris: Secondary | ICD-10-CM

## 2018-08-11 DIAGNOSIS — G720 Drug-induced myopathy: Secondary | ICD-10-CM | POA: Insufficient documentation

## 2018-08-11 MED ORDER — ALIROCUMAB 75 MG/ML ~~LOC~~ SOAJ: 75.0000 mg | SUBCUTANEOUS | 11 refills | Status: DC

## 2018-08-11 NOTE — Progress Notes (Signed)
Virtual Visit via Video   I connected with Phillip [name] on 08/11/18 at  1:30 PM EDT by a video enabled telemedicine application and verified that I am speaking with the correct person using two identifiers. Location patient: Home Location provider: [facility], Office Persons participating in the virtual visit: Patient, Dr. Raoul Pitch and R.Baker, LPN  I discussed the limitations of evaluation and management by telemedicine and the availability of in person appointments. The patient expressed understanding and agreed to proceed.  Subjective:   Chief Complaint  Patient presents with   Hyperlipidemia    No complaints or concerns. Pt went off Crestor 2 weeks ago per Dr Ellyn Hack because it was causing pt confusion. Dr Ellyn Hack was supposed to call back and let him know what to do but has not yet.    Hypertension    Pt states his BP has been high and they are trying to get this worked out with cardiology. He went on 03/11 and the cardiology states 140's are okay and pt is very concerned with it being high. He would like your opinion    HPI:   Hypertension/Raynaud's: Pt reports compliance with amlodipine 5 mg QD.  Since he was seen here  January 2020 his blood pressure medicines have increased significantly.  He had been well controlled on amlodipine 2.5 mg daily until his catheterization.  He reports he is to take amlodipine 10 mg daily, however when he did this he experienced swelling in bilateral lower extremities.  So he backed down to the amlodipine 5 mg daily.  His blood pressure reported over the last few days have been mostly in the 196Q systolic.  He is closely monitoring the sodium in his diet and eat a low-sodium diet.  He does admit he put on a few pounds since his catheterization.  He is back to his normal workout routine but is not doing as much work out in the yard and around his house as he usually does.  Patient denies chest pain, shortness of breath or lower extremity edema.  Pt  daily baby ASA and Plavix. Pt is tolerant to  statin. Patient has been evaluated by cardiology secondary to abnormal CT scan with coronary calcification present.  Coronary CT angiogram suggested significant LAD and circumflex disease.  He underwent a cardiac catheterization on February 26 with PCI to LAD. BMP: 07/01/2018 within normal limits CBC:/27/2020 within normal limits TSH: 12/15/2017 within normal limits Lipids:12/15/2017 total cholesterol 211, HDL 61, LDL 133, triglycerides 83. Diet: Low-sodium Exercise: Daily exercise RF: Hypertension, coronary artery disease, for lipidemia, family history of heart disease   Cardiac studies:  Coronary Calcium Score 17-Jun-2018: Score 1168.  Very high risk.  Referred for coronary CTA (also noted aortic atherosclerosis)  Coronary CTA -June 21, 2018: Cardiac cath score read 1318.  Moderate coronary disease sent for FFR: mRCA 0.84 (non-significant), mLAD 0.64 (signifiant), pLOM(Cx) - 0,72 (significant - but distal lesion);  Cardiac Cath  06/30/2018:  pLAD1 55% (not significant), p-mLAD2 80% & mLAD 70% (tandem lesions). mCx ~60%, mRCA 30% & 40%.  ER 55-60%.  p-mLAD2 80% & mLAD 70% (tandem lesions) - DES PCI Synergy DES 2.75 x 38 (3.0 mm).  mCx lesion & pLAD1 lesions - recommend Medical Management.  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   Chronic prescription benzodiazepine use 09/14/2017   Chronic insomnia 09/14/2017   Accidental testosterone overdose 03/16/2017   Erectile dysfunction 12/09/2016    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   Encephalopathy, traumatic 10/08/2015   Raynaud's disease 10/08/2015   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   Anxiety    Cognitive impairment 01/31/2013   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 (Patient taking differently: Take 5 mg by mouth daily. ), Disp: 90 tablet, Rfl: 3   amphetamine-dextroamphetamine (ADDERALL) 30 MG tablet, Take 1-2 tablets by mouth daily., Disp: 60 tablet, Rfl: 0   Ascorbic Acid (VITAMIN C PO), Take 1 tablet by mouth 2 (two) times daily., Disp: , Rfl:    B Complex-C (B-COMPLEX WITH VITAMIN C) tablet, Take 1 tablet by mouth daily., Disp: , Rfl:    clopidogrel (PLAVIX) 75 MG tablet, Take 1 tablet (75 mg total) by mouth daily with breakfast., Disp: 30 tablet, Rfl: 11   Coenzyme Q10 (COQ10) 100 MG CAPS, Take 100 mg by mouth 2 (two) times daily., Disp: , Rfl:    LORazepam (ATIVAN) 1 MG tablet, Take 2 tablets (2 mg total) by mouth at bedtime., Disp: 60 tablet, Rfl: 5   Misc Natural Products (PROSTATE SUPPORT PO), Take 1 capsule by mouth every evening. Prostate Plus, Disp: , Rfl:    nitroGLYCERIN (NITROSTAT) 0.4 MG SL tablet, Place 1 tablet (0.4 mg total) under the tongue every 5 (five) minutes as needed for chest pain., Disp: 25 tablet, Rfl: 3   NON FORMULARY, Take 1 capsule by mouth daily. MK-7 Supplements, Disp: , Rfl:    Omega-3 Fatty Acids (SUPER OMEGA 3 EPA/DHA PO), Take 1  capsule by mouth daily., Disp: , Rfl:    OVER THE COUNTER MEDICATION, Take 1 capsule by mouth daily. Nitric oxide supplements, Disp: , Rfl:    testosterone cypionate (DEPOTESTOSTERONE CYPIONATE) 200 MG/ML injection, 1x weekly  0.59mL, Disp: , Rfl:    vitamin E 400 UNIT capsule, Take 400 Units by mouth daily., Disp: , Rfl:    aspirin EC 81 MG tablet, Take 1 tablet (81 mg total) by mouth daily. (Patient not taking: Reported on 08/11/2018), Disp: 90 tablet, Rfl: 3   rosuvastatin (CRESTOR) 20 MG tablet, Take 1 tablet (20 mg total) by mouth daily at 6 PM. (Patient not taking: Reported on 08/11/2018), Disp: 30 tablet, Rfl: 0  Allergies  Allergen Reactions   Penicillins Hives    Did it involve swelling of the face/tongue/throat, SOB, or low BP? Unknown Did it involve sudden or severe rash/hives, skin peeling, or any reaction on the inside of your mouth or nose? Yes Did you need to seek medical attention at a hospital or doctor's office? Unknown When did  it last happen?Occurred at age 73 or 84 If all above answers are NO, may proceed with cephalosporin use.    Methylprednisolone Other (See Comments)    Unsure of exact reaction type   Amphetamines Other (See Comments)    Intolerant to specific formulations: [employer] amphetamine TEVA dextroamp amphetamine   Lorazepam Other (See Comments)    Mylan lorazepam; others ok   Propofol Other (See Comments)    Cognitive delay and emotional    Objective:  BP 134/72    Pulse 86    Temp (!) 96.8 F (36 C) (Oral)    Ht 6\' 1"  (1.854 m)    Wt 195 lb (88.5 kg)    BMI 25.73 kg/m  Gen: No acute distress. Nontoxic in appearance.  HENT: AT. [place].  MMM.  Eyes:Pupils Equal Round Reactive to light, Extraocular movements intact,  Conjunctiva without redness, discharge or icterus. CV: no edema Chest: Cough or shortness of breath not present Neuro:  Normal gait. Alert. Oriented x3  Psych: Normal affect, dress and demeanor. Normal speech. Normal thought  content and judgment.   Assessment and Plan:  Phillip [name] is a 63 y.o. male present Coronary artery disease involving native coronary artery of native heart without angina pectoris/Hyperlipidemia with target LDL less than 70/CAD S/P percutaneous coronary angioplasty/Raynaud's disease without gangrene/Essential hypertension/Statin intolerance -Patient has had mild hypertension until most recently.  He is unable to tolerate higher doses of amlodipine without side effects. -Continue amlodipine 5 mg daily.  He will monitor his blood pressures closely and if his blood pressures remain in the 093G systolic routinely, I advised him to call back and we will need to discuss a start of additional medication such as losartan.  He is agreeable to this plan, he elected to not start medication today and continue to monitor. - Unable to tolerate statins, discussed attempting Praluent ejections every 2 weeks, and he is agreeable to try praluent in  replacement of the statin.  Discussed with him likely we will need prior authorization-- we will try to get that process started for him today. -Low sodium diet, continue routine exercise - Alirocumab (PRALUENT) 75 MG/ML SOAJ; Inject 75 mg into the skin every 14 (fourteen) days.  Dispense: 1 pen; Refill: 11 -F/U 8 weeks after start of medication with a provider appointment.  Complete lipids, CMP and TSH at that time  > 25 minutes spent with patient, >50% of time spent face to face   Howard Pouch, DO 08/11/2018

## 2018-08-11 NOTE — Patient Instructions (Signed)
Continue to monitor blood pressures goal BP below 135/85.  Routinely above this goal, please call back sooner and we will need to discuss additional medications.  Continue exercise and low-sodium diet.  Prescribed Praluent injections every 2 weeks.  If prior authorization is needed we will start this process and you will hear back from [place] as soon as we can get approval for your medication.  Check with your pharmacy, medication may have went through without prior authorization needed.

## 2018-08-12 ENCOUNTER — Telehealth: Payer: Self-pay

## 2018-08-12 NOTE — Telephone Encounter (Signed)
Phillip [name] called in to inform practice that medication has been denied.

## 2018-08-12 NOTE — Telephone Encounter (Signed)
Prior Auth sent for Praluent 75mg /mL Pen through Cover My meds

## 2018-08-18 ENCOUNTER — Telehealth: Payer: Self-pay | Admitting: Family Medicine

## 2018-08-18 DIAGNOSIS — Z789 Other specified health status: Secondary | ICD-10-CM

## 2018-08-18 DIAGNOSIS — Z9861 Coronary angioplasty status: Secondary | ICD-10-CM

## 2018-08-18 DIAGNOSIS — I2510 Atherosclerotic heart disease of native coronary artery without angina pectoris: Secondary | ICD-10-CM

## 2018-08-18 DIAGNOSIS — E785 Hyperlipidemia, unspecified: Secondary | ICD-10-CM

## 2018-08-18 MED ORDER — EVOLOCUMAB 140 MG/ML ~~LOC~~ SOAJ: 140.0000 mg | SUBCUTANEOUS | 11 refills | Status: DC

## 2018-08-18 NOTE — Telephone Encounter (Signed)
Pt was called and detailed message was left about new medication and PA being denied. Will submit prior auth for new medication.

## 2018-08-18 NOTE — Telephone Encounter (Signed)
Please call patient and advise him that the injectable cholesterol medicine Praluent was not covered by his insurance.  The medication they do cover, that is in the same class as the praluent (PCSK9 inhibitor) is called Repatha.   I will submit the order for that medication and we will also likely need to submit another  prior authorization. The medicine is taken the same- injectable subq once every 2 weeks.  Side effect profile is about the same, with the exception of increased chance of an elevated sugar/ diabetes development (type 2).   Again this is only a potential side effect, just wanted him to be aware.  We would monitor him closely to make sure this does not occur to him.  This particular medication also has better coverage for decreasing his chances of heart attack and stroke than the other

## 2018-08-19 DIAGNOSIS — M9901 Segmental and somatic dysfunction of cervical region: Secondary | ICD-10-CM | POA: Diagnosis not present

## 2018-08-19 DIAGNOSIS — M7541 Impingement syndrome of right shoulder: Secondary | ICD-10-CM | POA: Diagnosis not present

## 2018-08-19 DIAGNOSIS — M7918 Myalgia, other site: Secondary | ICD-10-CM | POA: Diagnosis not present

## 2018-08-19 DIAGNOSIS — M9902 Segmental and somatic dysfunction of thoracic region: Secondary | ICD-10-CM | POA: Diagnosis not present

## 2018-08-19 NOTE — Telephone Encounter (Signed)
PA was completed and submitted in [email or web address]

## 2018-08-20 NOTE — Telephone Encounter (Signed)
Pt was called and detailed message left for patient that medication was approved and he can go and pick up from the pharmacy. Pt was educated that pharmacist can teach how to use the medication or call us if he needed instructions.

## 2018-08-24 ENCOUNTER — Telehealth: Payer: Self-pay | Admitting: Internal Medicine

## 2018-08-24 NOTE — Telephone Encounter (Signed)
Caller states that the Testerone is being sent to pharmacy as 3.19mL and it should be 0.60mL.  [facility] [place] [facility] [address]

## 2018-08-24 NOTE — Telephone Encounter (Signed)
Chart reviewed, it looks like his cardiologist put in the order but did it as a historical. Not sure how it made it to the pharmacy. Please review RX.

## 2018-08-24 NOTE — Telephone Encounter (Signed)
OK to send it as 0.3 ml once a week - as much as he was getting before

## 2018-08-26 ENCOUNTER — Other Ambulatory Visit: Payer: Self-pay | Admitting: Internal Medicine

## 2018-08-26 NOTE — Telephone Encounter (Signed)
Last OV 08/24/18 Testosterone last filled 03/25/18 89mL with 4 refills

## 2018-08-26 NOTE — Telephone Encounter (Signed)
Ok to send

## 2018-08-27 ENCOUNTER — Other Ambulatory Visit: Payer: Self-pay

## 2018-08-27 MED ORDER — TESTOSTERONE CYPIONATE 200 MG/ML IM SOLN: INTRAMUSCULAR | 2 refills | Status: DC

## 2018-08-27 NOTE — Telephone Encounter (Signed)
Medication filled to pharmacy as requested.   

## 2018-08-27 NOTE — Telephone Encounter (Signed)
New RX printed and faxed. 

## 2018-08-31 DIAGNOSIS — M9902 Segmental and somatic dysfunction of thoracic region: Secondary | ICD-10-CM | POA: Diagnosis not present

## 2018-08-31 DIAGNOSIS — M7918 Myalgia, other site: Secondary | ICD-10-CM | POA: Diagnosis not present

## 2018-08-31 DIAGNOSIS — M7541 Impingement syndrome of right shoulder: Secondary | ICD-10-CM | POA: Diagnosis not present

## 2018-08-31 DIAGNOSIS — M9901 Segmental and somatic dysfunction of cervical region: Secondary | ICD-10-CM | POA: Diagnosis not present

## 2018-09-02 ENCOUNTER — Telehealth (HOSPITAL_COMMUNITY): Payer: Self-pay

## 2018-09-02 ENCOUNTER — Encounter: Payer: Self-pay | Admitting: Pharmacist Clinician (PhC)/ Clinical Pharmacy Specialist

## 2018-09-14 DIAGNOSIS — M9902 Segmental and somatic dysfunction of thoracic region: Secondary | ICD-10-CM | POA: Diagnosis not present

## 2018-09-14 DIAGNOSIS — M7918 Myalgia, other site: Secondary | ICD-10-CM | POA: Diagnosis not present

## 2018-09-14 DIAGNOSIS — M9901 Segmental and somatic dysfunction of cervical region: Secondary | ICD-10-CM | POA: Diagnosis not present

## 2018-09-14 DIAGNOSIS — M7541 Impingement syndrome of right shoulder: Secondary | ICD-10-CM | POA: Diagnosis not present

## 2018-09-16 ENCOUNTER — Telehealth: Payer: Self-pay | Admitting: Neurology

## 2018-09-16 ENCOUNTER — Encounter: Payer: Self-pay | Admitting: Neurology

## 2018-09-16 NOTE — Telephone Encounter (Signed)
Called Phillip [name] listed on DPR to inform that pt has a apt on 5/19 1:30 pm and see about transition his video to VV. No answer, LVM for them to call back.

## 2018-09-16 NOTE — Telephone Encounter (Signed)
When pt calls back Dr [name] is leaving out of the office for a dentist apt please schedule pt with NP

## 2018-09-20 ENCOUNTER — Ambulatory Visit (INDEPENDENT_AMBULATORY_CARE_PROVIDER_SITE_OTHER): Payer: BLUE CROSS/BLUE SHIELD | Admitting: Adult Health

## 2018-09-20 ENCOUNTER — Telehealth: Payer: Self-pay | Admitting: Cardiology

## 2018-09-20 ENCOUNTER — Other Ambulatory Visit: Payer: Self-pay

## 2018-09-20 ENCOUNTER — Encounter: Payer: Self-pay | Admitting: Adult Health

## 2018-09-20 DIAGNOSIS — F5104 Psychophysiologic insomnia: Secondary | ICD-10-CM | POA: Diagnosis not present

## 2018-09-20 DIAGNOSIS — F901 Attention-deficit hyperactivity disorder, predominantly hyperactive type: Secondary | ICD-10-CM | POA: Diagnosis not present

## 2018-09-20 NOTE — Telephone Encounter (Signed)
Follow up    Patient is returning your call. Please call patient.

## 2018-09-20 NOTE — Telephone Encounter (Signed)
New Message    Pts wife is calling because the pt is still feeling bad, after stent. She says he has Fatigue and weakness in his arms and legs. She says he has been exercising but its not working. She also says he has had elevated BP. Today was 148/78    Please call back

## 2018-09-20 NOTE — Progress Notes (Signed)
PATIENT: Phillip [name] DOB: [birth year]  REASON FOR VISIT: follow up HISTORY FROM: patient  Virtual Visit via Video Note  I connected with Phillip [name] on 09/20/18 at  1:00 PM EDT by a video enabled telemedicine application located remotely at [facility] and verified that I am speaking with the correct person using two identifiers who was located at their own home.   I discussed the limitations of evaluation and management by telemedicine and the availability of in person appointments. The patient expressed understanding and agreed to proceed.   PATIENT: Phillip [name] DOB: 02-27-1956  REASON FOR VISIT: follow up HISTORY FROM: patient  HISTORY OF PRESENT ILLNESS: Today 09/20/18 :  Phillip [name] is a 63 year old male with a history of insomnia and ADHD.  He returns today for a virtual visit.  He states that in February he had 2 stents placed.  Since then he has had trouble with his blood pressure and cholesterol.  He is currently on Repatha for his cholesterol.  He states that his cardiologist is aware that he is on Adderall and he is okay with this.  The patient reports that he has not felt "great" since his procedure in February.  Reports an increase in fatigue and cognitive disturbance.  He has not seen his cardiologist since March.  The patient continues on Adderall 60 mg daily.  He is currently on Ativan 2 mg at bedtime.  Reports that he has tried to wean off of this in the past but was not successful.  He returns today for the virtual visit  HISTORY 03/23/18:  Phillip [name] is a 63 year old male with a history of chronic insomnia and ADHD.  He returns today for follow-up.  He reports that Adderall continues to work well for him.  He typically takes 1 tablet in the morning and 1 tablet months.  He states that he has continued to try to wean off his Ativan. He moved last month and states that he just started weaning off the medication about 2 weeks ago.  He states he  has been alternating taking 1-1/2 tablet at night and taking 2 tablets the next night.  He states that he is sleeping okay.  He denies any significant issues.  He returns today for evaluation.   REVIEW OF SYSTEMS: Out of a complete 14 system review of symptoms, the patient complains only of the following symptoms, and all other reviewed systems are negative.  See HPI  ALLERGIES: Allergies  Allergen Reactions   Penicillins Hives    Did it involve swelling of the face/tongue/throat, SOB, or low BP? Unknown Did it involve sudden or severe rash/hives, skin peeling, or any reaction on the inside of your mouth or nose? Yes Did you need to seek medical attention at a hospital or doctor's office? Unknown When did it last happen?Occurred at age 17 or 77 If all above answers are NO, may proceed with cephalosporin use.    Statins Other (See Comments)    confusion   Methylprednisolone Other (See Comments)    Unsure of exact reaction type   Amphetamines Other (See Comments)    Intolerant to specific formulations: [employer] amphetamine TEVA dextroamp amphetamine   Lorazepam Other (See Comments)    Mylan lorazepam; others ok   Propofol Other (See Comments)    Cognitive delay and emotional    HOME MEDICATIONS: Outpatient Medications Prior to Visit  Medication Sig Dispense Refill   amLODipine (NORVASC) 10 MG tablet Take 1  tablet (10 mg total) by mouth daily. (Patient taking differently: Take 5 mg by mouth daily. ) 90 tablet 3   amphetamine-dextroamphetamine (ADDERALL) 30 MG tablet Take 1-2 tablets by mouth daily. 60 tablet 0   Ascorbic Acid (VITAMIN C PO) Take 1 tablet by mouth 2 (two) times daily.     aspirin EC 81 MG tablet Take 1 tablet (81 mg total) by mouth daily. 90 tablet 3   B Complex-C (B-COMPLEX WITH VITAMIN C) tablet Take 1 tablet by mouth daily.     clopidogrel (PLAVIX) 75 MG tablet Take 1 tablet (75 mg total) by mouth daily with breakfast. 30 tablet 11   Coenzyme  Q10 (COQ10) 100 MG CAPS Take 100 mg by mouth 2 (two) times daily.     Evolocumab (REPATHA SURECLICK) 324 MG/ML SOAJ Inject 140 mg into the skin every 14 (fourteen) days. 2 pen 11   LORazepam (ATIVAN) 1 MG tablet Take 2 tablets (2 mg total) by mouth at bedtime. 60 tablet 5   Misc Natural Products (PROSTATE SUPPORT PO) Take 1 capsule by mouth every evening. Prostate Plus     nitroGLYCERIN (NITROSTAT) 0.4 MG SL tablet Place 1 tablet (0.4 mg total) under the tongue every 5 (five) minutes as needed for chest pain. 25 tablet 3   NON FORMULARY Take 1 capsule by mouth daily. MK-7 Supplements     Omega-3 Fatty Acids (SUPER OMEGA 3 EPA/DHA PO) Take 1 capsule by mouth daily.     OVER THE COUNTER MEDICATION Take 1 capsule by mouth daily. Nitric oxide supplements     testosterone cypionate (DEPOTESTOSTERONE CYPIONATE) 200 MG/ML injection Inject 0.3 ML once a week 10 mL 2   vitamin E 400 UNIT capsule Take 400 Units by mouth daily.     No facility-administered medications prior to visit.     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 Synergy DES 2.75 x 38 -- 3.0 mm).  mCx ~60% - med Rx, mRCA 30% & 40%.  ER 55-60%.   Cellulitis    Chronic traumatic encephalopathy    right frontal-temproal lobe   Complication of anesthesia    " BRAIN FOG "   Coronary artery calcification seen on CAT scan 05/12/2018   Triple- by CT - lung ca screen 04/2018  Cardio 06/2018: Coronary calcium score results shows aortic atherosclerosis (with mild dilation) as well as left main and three-vessel coronary artery  calcification. The calcium score is 1168 which is very high risk amount of calcium.  Based on these findings, I would recommend proceeding with a coronary CT angiogram (a more detailed study with Intravenous X   Depression    Erectile dysfunction 11/2016   follows with urology; sildenafil prescribed   Meningoencephalitis 1994   [place]   Pneumonia    Primary hypogonadism in male    Raynaud's disease 40/02/2724   As complication of RMSF   Restless legs 09/13/2015   [facility] spotted fever 2017   TBI (traumatic brain injury) ([place]) 1997, 2009   "concussions"    PAST SURGICAL HISTORY: Past Surgical  History:  Procedure Laterality Date   CORONARY STENT INTERVENTION N/A 06/30/2018   Procedure: CORONARY STENT INTERVENTION;  Surgeon: [name], MD;  Location: [facility] IN[facility] LAB;;  p-mLAD2 80% & mLAD 70% (tandem lesions) - DES PCI Synergy DES 2.75 x 38 (3.0 mm).   CT CTA CORONARY W/CA SCORE W/CM &/OR WO/CM  06/2018   Cardiac cath score read 1318.  Moderate coronary disease sent for FFR: mRCA 0.84 (non-significant), mLAD 0.64 (signifiant), pLOM(Cx) - 0,72 (significant - but distal lesion)   LEFT HEART CATH AND CORONARY ANGIOGRAPHY N/A 06/30/2018   Procedure: LEFT HEART CATH AND CORONARY ANGIOGRAPHY;  Surgeon: [name], MD;  Location: [facility] INVASIVE CV LAB;;  pLAD1 55% (not significant), p-mLAD2 80% & mLAD 70% (tandem lesions -> DES PCI). mCx ~60% (med Rx), mRCA 30% & 40%.  ER 55-60%.    TEE WITHOUT CARDIOVERSION N/A 09/06/2015   Procedure: TRANSESOPHAGEAL ECHOCARDIOGRAM (TEE);  Surgeon: Jerline Pain, MD;  R/O endocarditis;   TONSILLECTOMY  1965    FAMILY HISTORY: Family History  Problem Relation Age of Onset   Hyperlipidemia Father    Heart disease Maternal Grandfather 81   Cancer Paternal Grandfather    Mental illness Mother    Mental illness Brother     SOCIAL HISTORY: Social History   Socioeconomic History   Marital status: Divorced    Spouse  name: [name]   Number of children: 2   Years of education: 16   Highest education level: Not on file  Occupational History   Occupation: Retired  Scientist, product/process development strain: Not on file   Food insecurity:    Worry: Not on file    Inability: Not on Lexicographer needs:    Medical: Not on file    Non-medical: Not on file  Tobacco Use   Smoking status: Former Smoker    Packs/day: 1.50    Years: 25.00    Pack years: 37.50    Types: Cigarettes    Last attempt to quit: 11/22/2014    Years since quitting: 3.8   Smokeless tobacco: Never Used   Tobacco comment: Encouraged to remain smoke free  Substance and Sexual Activity   Alcohol use: No    Alcohol/week: 0.0 standard drinks   Drug use: No   Sexual activity: Yes    Partners: Female  Lifestyle   Physical activity:    Days per week: Not on file    Minutes per session: Not on file   Stress: Not on file  Relationships   Social connections:    Talks on phone: Not on file    Gets together: Not on file    Attends religious service: Not on file    Active member of club or organization: Not on file    Attends meetings of clubs or organizations: Not on file    Relationship status: Not on file   Intimate partner violence:    Fear of current or ex partner: Not on file    Emotionally abused: Not on file    Physically abused: Not on file    Forced sexual activity: Not on file  Other Topics Concern   Not on file  Social History Narrative   Divorced. B.A. degree. Retired.   Drinks caffeine, uses herbal remedies, takes a daily vitamin.   Wears his seatbelt.  Smoke detector in the home.   Firearms locked in the home.   Feels safe in relationships.  PHYSICAL EXAM   Generalized: Well developed, in no acute distress   Neurological examination  Mentation: Alert oriented to time, place, history taking. Follows all commands speech and language fluent Cranial nerve II-XII:Extraocular  movements were full. Facial symmetry noted. uvula tongue midline. Head turning and shoulder shrug  were normal and symmetric. Motor: Good strength throughout subjectively per patient Sensory: Sensory testing is intact to soft touch on all 4 extremities subjectively per patient Coordination: Cerebellar testing reveals good finger-nose-finger  Gait and station: Patient is able to stand from a seated position. gait is normal.  Reflexes: UTA  DIAGNOSTIC DATA (LABS, IMAGING, TESTING) - I reviewed patient records, labs, notes, testing and imaging myself where available.  Lab Results  Component Value Date   WBC 8.0 07/01/2018   HGB 14.8 07/01/2018   HCT 44.7 07/01/2018   MCV 91.8 07/01/2018   PLT 297 07/01/2018      Component Value Date/Time   NA 135 07/01/2018 2333   NA 141 06/28/2018 1117   K 3.8 07/01/2018 2333   CL 103 07/01/2018 2333   CO2 25 07/01/2018 2333   GLUCOSE 106 (H) 07/01/2018 2333   BUN 17 07/01/2018 2333   BUN 14 06/28/2018 1117   CREATININE 0.98 07/01/2018 2333   CREATININE 0.71 01/03/2015 1339   CALCIUM 9.2 07/01/2018 2333   PROT 6.8 03/02/2018 1131   ALBUMIN 4.6 03/02/2018 1131   AST 22 03/02/2018 1131   ALT 17 03/02/2018 1131   ALKPHOS 89 03/02/2018 1131   BILITOT 0.9 03/02/2018 1131   GFRNONAA >60 07/01/2018 2333   GFRNONAA >89 04/12/2014 1722   GFRAA >60 07/01/2018 2333   GFRAA >89 04/12/2014 1722   Lab Results  Component Value Date   CHOL 211 (H) 12/15/2017   HDL 61.60 12/15/2017   LDLCALC 133 (H) 12/15/2017   TRIG 83.0 12/15/2017   CHOLHDL 3 12/15/2017   Lab Results  Component Value Date   HGBA1C 5.5 12/15/2017   No results found for: [id number] Lab Results  Component Value Date   TSH 2.67 12/15/2017      ASSESSMENT AND PLAN 63 y.o. 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 spotted fever (2017), and TBI (traumatic brain injury) ([place]) (1997, 2009). here with:  1.  Insomnia 2.  ADHD  The patient will continue on Adderall.  We will try to reduce his dose of Ativan.  I encouraged the patient to decrease to 1-1/2 tablets alternating between 2 tablets nightly for 2 weeks: 1 night he will take 1-1/2 tablet the next night 2 tablets in the morning for 2 weeks he will then reduce to 1/2 tablet nightly for 2 weeks.  If he is tolerating this well he will call and I will give him further instructions.  Have encouraged him to follow-up with his cardiologist.  Patient voiced understanding.  He will follow-up in 6 months or sooner if needed.   I spent 15 minutes with the patient this time spent reviewing his chart prior to his visit, discussing his symptoms and reviewing medication instructions.   [name], MSN, NP-C 09/20/2018, 12:59 PM [facility] [address] [place], [place] [address] [phone]

## 2018-09-20 NOTE — Telephone Encounter (Signed)
I do not think that the catheter having him do with him having weakness in his extremities.  We switched him off of any cholesterol medicine that would have that effect.  He is no longer on statin. I would just go to taking 5 mg of amlodipine if the pressure stable.  If pressure goes up higher, would probably need to choose a different blood pressure medication.  [name], MD

## 2018-09-20 NOTE — Telephone Encounter (Signed)
Spoke with pt and feels that since cath notes weakness to extremities even with exercising no improvement and also notes B/P is around 148/78 even with taking Amlodipine 7.5 mg and prior to cath pt states B/P was lower than this.Pt states did not tolerate the 10 mg of Amlodipine caused swelling .Also pt is watching salt intake and has not really gained a lot of weight maybe 5 lbs since cath.Will forward to Dr Ellyn Hack for review .Adonis Housekeeper

## 2018-09-20 NOTE — Telephone Encounter (Signed)
Lm to call back ./cy 

## 2018-09-21 ENCOUNTER — Ambulatory Visit: Payer: BLUE CROSS/BLUE SHIELD | Admitting: Neurology

## 2018-09-21 NOTE — Telephone Encounter (Signed)
Left message for patient and friend karen to call back

## 2018-09-28 DIAGNOSIS — M9902 Segmental and somatic dysfunction of thoracic region: Secondary | ICD-10-CM | POA: Diagnosis not present

## 2018-09-28 DIAGNOSIS — M7541 Impingement syndrome of right shoulder: Secondary | ICD-10-CM | POA: Diagnosis not present

## 2018-09-28 DIAGNOSIS — M9901 Segmental and somatic dysfunction of cervical region: Secondary | ICD-10-CM | POA: Diagnosis not present

## 2018-09-28 DIAGNOSIS — M7918 Myalgia, other site: Secondary | ICD-10-CM | POA: Diagnosis not present

## 2018-10-06 DIAGNOSIS — Z20828 Contact with and (suspected) exposure to other viral communicable diseases: Secondary | ICD-10-CM | POA: Diagnosis not present

## 2018-10-06 DIAGNOSIS — U071 COVID-19: Secondary | ICD-10-CM | POA: Diagnosis not present

## 2018-10-19 ENCOUNTER — Telehealth: Payer: Self-pay | Admitting: Adult Health

## 2018-10-19 ENCOUNTER — Other Ambulatory Visit: Payer: Self-pay | Admitting: Internal Medicine

## 2018-10-19 DIAGNOSIS — E280 Estrogen excess: Secondary | ICD-10-CM

## 2018-10-19 DIAGNOSIS — E230 Hypopituitarism: Secondary | ICD-10-CM

## 2018-10-19 NOTE — Telephone Encounter (Signed)
Pt is tolerating the tapered dosage of Ativan well, he states he was told to call and report.

## 2018-10-20 DIAGNOSIS — R52 Pain, unspecified: Secondary | ICD-10-CM | POA: Diagnosis not present

## 2018-10-20 NOTE — Telephone Encounter (Signed)
I spoke to Ms. Ramsey.  She advises that he is currently taking Ativan 1-1/2 tablets at night.  We will further decrease his dose alternating between 1-1/2 tablets at night and 1 tablet the next day for 2 weeks.  If he is able to tolerate this he will further decrease alternating between 1 tablet at night and a half a tablet the next night for 2 weeks.

## 2018-10-20 NOTE — Telephone Encounter (Signed)
I called and LVM.

## 2018-10-25 ENCOUNTER — Other Ambulatory Visit (INDEPENDENT_AMBULATORY_CARE_PROVIDER_SITE_OTHER): Payer: BC Managed Care – PPO

## 2018-10-25 ENCOUNTER — Other Ambulatory Visit: Payer: Self-pay

## 2018-10-25 DIAGNOSIS — E280 Estrogen excess: Secondary | ICD-10-CM

## 2018-10-25 DIAGNOSIS — E230 Hypopituitarism: Secondary | ICD-10-CM | POA: Diagnosis not present

## 2018-10-25 LAB — PSA: PSA: 0.51 ng/mL (ref 0.10–4.00)

## 2018-10-26 ENCOUNTER — Telehealth: Payer: Self-pay | Admitting: Cardiology

## 2018-10-26 ENCOUNTER — Telehealth: Payer: Self-pay

## 2018-10-26 NOTE — Telephone Encounter (Signed)
Virtual Visit Pre-Appointment Phone Call Phillip [name], I am calling you today to discuss your upcoming appointment. We are currently trying to limit exposure to the virus that causes COVID-19 by seeing patients at home rather than in the office."  1. "What is the BEST phone number to call the day of the visit?" - include this in appointment notes  2. "Do you have or have access to (through a family member/friend) a smartphone with video capability that we can use for your visit?" a. If yes - list this number in appt notes as "cell" (if different from BEST phone #) and list the appointment type as a VIDEO visit in appointment notes b. If no - list the appointment type as a PHONE visit in appointment notes  Confirm consent - "In the setting of the current Covid19 crisis, you are scheduled for a (phone or video) visit with your provider on (date) at (time).  Just as we do with many in-office visits, in order for you to participate in this visit, we must obtain consent.  If you'd like, I can send this to your mychart (if signed up) or email for you to review.  Otherwise, I can obtain your verbal consent now.  All virtual visits are billed to your insurance company just like a normal visit would be.  By agreeing to a virtual visit, we'd like you to understand that the technology does not allow for your provider to perform an examination, and thus may limit your provider's ability to fully assess your condition. If your provider identifies any concerns that need to be evaluated in person, we will make arrangements to do so.  Finally, though the technology is pretty good, we cannot assure that it will always work on either your or our end, and in the setting of a video visit, we may have to convert it to a phone-only visit.  In either situation, we cannot ensure that we have a secure connection.  Are you willing to proceed?" STAFF: Pt provided verbal consent.  3. Advise patient to be prepared - "Two hours  prior to your appointment, go ahead and check your blood pressure, pulse, oxygen saturation, and your weight (if you have the equipment to check those) and write them all down. When your visit starts, your provider will ask you for this information. If you have an Apple Watch or Kardia device, please plan to have heart rate information ready on the day of your appointment. Please have a pen and paper handy nearby the day of the visit as well."  4. Give patient instructions for MyChart download to smartphone OR Doximity/Doxy.me as below if video visit (depending on what platform provider is using)  5. Inform patient they will receive a phone call 15 minutes prior to their appointment time (may be from unknown caller ID) so they should be prepared to answer    Phillip [name] has been deemed a candidate for a follow-up tele-health visit to limit community exposure during the Covid-19 pandemic. I spoke with the patient via phone to ensure availability of phone/video source, confirm preferred email & phone number, and discuss instructions and expectations.  I reminded Phillip [name] to be prepared with any vital sign and/or heart rhythm information that could potentially be obtained via home monitoring, at the time of his visit. I reminded Phillip [name] to expect a phone call prior to his visit.  Phillip [name], [place] 10/26/2018 1:05 PM  INSTRUCTIONS FOR DOWNLOADING THE MYCHART APP TO SMARTPHONE  - The patient must first make sure to have activated MyChart and know their login information - If Apple, go to [employer] and type in MyChart in the search bar and download the app. If Android, ask patient to go to [employer] and type in [place] in the search bar and download the app. The app is free but as with any other app downloads, their phone may require them to verify saved payment information or Apple/Android password.  - The patient will need to then log into the app with  their MyChart username and password, and select [facility] as their healthcare provider to link the account. When it is time for your visit, go to the MyChart app, find appointments, and click Begin Video Visit. Be sure to Select Allow for your device to access the Microphone and Camera for your visit. You will then be connected, and your provider will be with you shortly.  **If they have any issues connecting, or need assistance please contact MyChart service desk [phone])**  **If using a computer, in order to ensure the best quality for their visit they will need to use either of the following Internet Browsers: [employer], or Google Chrome**  IF USING DOXIMITY or DOXY.ME - The patient will receive a link just prior to their visit by text.     FULL LENGTH CONSENT FOR TELE-HEALTH VISIT   I hereby voluntarily request, consent and authorize [facility] and its employed or contracted physicians, physician assistants, nurse practitioners or other licensed health care professionals (the Practitioner), to provide me with telemedicine health care services (the "Services") as deemed necessary by the treating Practitioner. I acknowledge and consent to receive the Services by the Practitioner via telemedicine. I understand that the telemedicine visit will involve communicating with the Practitioner through live audiovisual communication technology and the disclosure of certain medical information by electronic transmission. I acknowledge that I have been given the opportunity to request an in-person assessment or other available alternative prior to the telemedicine visit and am voluntarily participating in the telemedicine visit.  I understand that I have the right to withhold or withdraw my consent to the use of telemedicine in the course of my care at any time, without affecting my right to future care or treatment, and that the Practitioner or I may terminate the telemedicine  visit at any time. I understand that I have the right to inspect all information obtained and/or recorded in the course of the telemedicine visit and may receive copies of available information for a reasonable fee.  I understand that some of the potential risks of receiving the Services via telemedicine include:  Marland Kitchen Delay or interruption in medical evaluation due to technological equipment failure or disruption; . Information transmitted may not be sufficient (e.g. poor resolution of images) to allow for appropriate medical decision making by the Practitioner; and/or  . In rare instances, security protocols could fail, causing a breach of personal health information.  Furthermore, I acknowledge that it is my responsibility to provide information about my medical history, conditions and care that is complete and accurate to the best of my ability. I acknowledge that Practitioner's advice, recommendations, and/or decision may be based on factors not within their control, such as incomplete or inaccurate data provided by me or distortions of diagnostic images or specimens that may result from electronic transmissions. I understand that the practice of medicine is not an exact science  and that Practitioner makes no warranties or guarantees regarding treatment outcomes. I acknowledge that I will receive a copy of this consent concurrently upon execution via email to the email address I last provided but may also request a printed copy by calling the office of [facility].    I understand that my insurance will be billed for this visit.   I have read or had this consent read to me. . I understand the contents of this consent, which adequately explains the benefits and risks of the Services being provided via telemedicine.  . I have been provided ample opportunity to ask questions regarding this consent and the Services and have had my questions answered to my satisfaction. . I give my informed consent for  the services to be provided through the use of telemedicine in my medical care  By participating in this telemedicine visit I agree to the above.

## 2018-10-26 NOTE — Telephone Encounter (Signed)
Mychart, smartphone, consent, pre reg complete  10/26/18 AF

## 2018-10-27 ENCOUNTER — Telehealth: Payer: Self-pay | Admitting: *Deleted

## 2018-10-27 ENCOUNTER — Telehealth (INDEPENDENT_AMBULATORY_CARE_PROVIDER_SITE_OTHER): Payer: BC Managed Care – PPO | Admitting: Cardiology

## 2018-10-27 VITALS — BP 138/78 | HR 76 | Ht 73.0 in | Wt 188.0 lb

## 2018-10-27 DIAGNOSIS — R4189 Other symptoms and signs involving cognitive functions and awareness: Secondary | ICD-10-CM | POA: Diagnosis not present

## 2018-10-27 DIAGNOSIS — E785 Hyperlipidemia, unspecified: Secondary | ICD-10-CM | POA: Diagnosis not present

## 2018-10-27 DIAGNOSIS — Z789 Other specified health status: Secondary | ICD-10-CM | POA: Diagnosis not present

## 2018-10-27 DIAGNOSIS — I2510 Atherosclerotic heart disease of native coronary artery without angina pectoris: Secondary | ICD-10-CM | POA: Diagnosis not present

## 2018-10-27 DIAGNOSIS — Z9861 Coronary angioplasty status: Secondary | ICD-10-CM

## 2018-10-27 DIAGNOSIS — I10 Essential (primary) hypertension: Secondary | ICD-10-CM

## 2018-10-27 NOTE — Patient Instructions (Addendum)
Medication Instructions:  NO MEDICATION CHANGES  If you need a refill on your cardiac medications before your next appointment, please call your pharmacy.   Lab work: FASTING LIPID AND CMP  NEEDED-  WILL MAIL LABSLIP AND FOLLOW INSTRUCTION  If you have labs (blood work) drawn today and your tests are completely normal, you will receive your results only by: Marland Kitchen MyChart Message (if you have MyChart) OR . A paper copy in the mail If you have any lab test that is abnormal or we need to change your treatment, we will call you to review the results.  Testing/Procedures: NOT NEEDED Follow-Up: At [facility], you and your health needs are our priority.  As part of our continuing mission to provide you with exceptional heart care, we have created designated Provider Care Teams.  These Care Teams include your primary Cardiologist (physician) and Advanced Practice Providers (APPs -  Physician Assistants and Nurse Practitioners) who all work together to provide you with the care you need, when you need it.  . You will need a follow up appointment in   6 TO 8 MONTHS months [id number] - FEB 2021.  Please call our office 2 months in advance to schedule this appointment.  You may see [name], MD or one of the following Advanced Practice Providers on your designated Care Team:   . Rosaria Ferries, PA-C . Jory Sims, DNP, ANP  Any Other Special Instructions Will Be Listed Below (If Applicable).

## 2018-10-27 NOTE — Telephone Encounter (Signed)
SPOKE TO PATIENT - INSTRUCTION GIVEN FROM TELE-VISIT 6/24 -- AVS SUMMARY AND LABSLIP WILL  BE MAILED TO PATIENT , AVS SUMMARY SENT VIA MYCHART. PATIENT VERBALIZED UNDESRSTANDING

## 2018-10-27 NOTE — Progress Notes (Signed)
Virtual Visit via Video Note   This visit type was conducted due to national recommendations for restrictions regarding the COVID-19 Pandemic (e.g. social distancing) in an effort to limit this patient's exposure and mitigate transmission in our community.  Due to his co-morbid illnesses, this patient is at least at moderate risk for complications without adequate follow up.  This format is felt to be most appropriate for this patient at this time.  All issues noted in this document were discussed and addressed.  A limited physical exam was performed with this format.  Please refer to the patient's chart for his consent to telehealth for [facility]   Patient has given verbal permission to conduct this visit via virtual appointment and to bill insurance 11/01/2018 4:55 PM     Evaluation Performed:  Follow-up visit  Date:  11/01/2018   ID:  Phillip [name], Phillip [name] 1955-09-14, MRN [id number]  Patient Location: Home Provider Location: Home  PCP:  [name], DO  Cardiologist:  [name], MD  Electrophysiologist:  None   Chief Complaint: 91-month follow-up-CAD  History of Present Illness:    Phillip [name] is a 63 y.o. male with PMH notable for CAD having PCI who presents via audio/video conferencing for a telehealth visit today.  Phillip [name] was last seen July 14, 2018 for post PCI follow-up.  For his initial evaluation based on family history of CAD we did a coronary calcium score with elevated calcium score and suggestion of three-vessel CAD.  He was then sent for CT angiogram suggesting significant LAD and circumflex disease.  Was therefore taken to the Cath Lab in [facility] PCI of the LAD. 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 --During this visit, multiple questions were asked and answered.  Was noted that he was walking about 40 minutes a day.  Was interested in restarting testosterone therapy for  help with hypergonadism. --> Plan to continue Plavix for 1 if not 2 years based on LAD PCI.  However okay to hold for procedures 6 months post PCI. -> Increase amlodipine to 10 mg daily.  Avoiding beta-blocker due to bradycardia. --> Referred to [facility] (Pharmacist run [facility] - has memory issues with stating) --> Because of patient call him with dizziness, we reduce his amlodipine back to 5 mg.  Interval History:  Phillip [name] is being seen today via telehealth visit.  He is doing quite well now with the recent changes that were made.  Much less dizziness since we decrease his amlodipine.  Edema is also improved. He is not having any problems of any chest tightness or pressure.  None of the atypical type chest discomfort symptoms he was having before.  He is now active walking with no cardiac symptoms.  Cardiovascular ROS: no chest pain or dyspnea on exertion negative for - irregular heartbeat, orthopnea, palpitations, paroxysmal nocturnal dyspnea, rapid heart rate, shortness of breath or Syncope/near syncope, TIA/amaurosis fugax  The patient does not have symptoms concerning for COVID-19 infection (fever, chills, cough, or new shortness of breath).  The patient is practicing social distancing.  ROS:  Please see the history of present illness.    ROS  Past Medical History:  Diagnosis Date  . ADD (attention deficit disorder)    on adderrall managed by neurology  . Anxiety    on ativan managed by Neurology  . 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 CAD S/P percutaneous coronary angioplasty 06/2017   pLAD1 55% (not significant), p-mLAD2 80% & mLAD 70% (tandem lesions - DES PCI . Synergy DES 2.75 x 38 -- 3.0 mm).  mCx ~60% - med Rx, mRCA 30% & 40%.  ER 55-60%.  . Cellulitis   . Chronic traumatic encephalopathy    right frontal-temproal lobe  . Complication of anesthesia    " BRAIN FOG "  . Coronary  artery calcification seen on CAT scan 05/12/2018   Triple- by CT - lung ca screen 04/2018  Cardio 06/2018: Coronary calcium score results shows aortic atherosclerosis (with mild dilation) as well as left main and three-vessel coronary artery calcification. The calcium score is 1168 which is very high risk amount of calcium.  Based on these findings, I would recommend proceeding with a coronary CT angiogram (a more detailed study with Intravenous X  . Depression   . Erectile dysfunction 11/2016   follows with urology; sildenafil prescribed  . Meningoencephalitis 1994   [place]  . Pneumonia   . Primary hypogonadism in male   . Raynaud's disease 02/54/2706   As complication of RMSF  . Restless legs 09/13/2015  . [place] spotted fever 2017  . TBI (traumatic brain injury) ([place]) 1997, 2009   "concussions"   Past Surgical History:  Procedure Laterality Date  . CORONARY STENT INTERVENTION N/A 06/30/2018   Procedure: CORONARY STENT INTERVENTION;  Surgeon: [name], MD;  Location: [facility] INVASIVE CV LAB;;  p-mLAD2 80% & mLAD 70% (tandem lesions) - DES PCI Synergy DES 2.75 x 38 (3.0 mm).  . CT CTA CORONARY W/CA SCORE W/CM &/OR WO/CM  06/2018   Cardiac cath score read 1318.  Moderate coronary disease sent for FFR: mRCA 0.84 (non-significant), mLAD 0.64 (signifiant), pLOM(Cx) - 0,72 (significant - but distal lesion)  . LEFT HEART CATH AND CORONARY ANGIOGRAPHY N/A 06/30/2018   Procedure: LEFT HEART CATH AND CORONARY ANGIOGRAPHY;  Surgeon: [name], MD;  Location: [facility] INVASIVE CV LAB;;  pLAD1 55% (not significant), p-mLAD2 80% & mLAD 70% (tandem lesions -> DES PCI). mCx ~60% (med Rx), mRCA 30% & 40%.  ER 55-60%.   . TEE WITHOUT CARDIOVERSION N/A 09/06/2015   Procedure: TRANSESOPHAGEAL ECHOCARDIOGRAM (TEE);  Surgeon: Jerline Pain, MD;  R/O endocarditis;  . TONSILLECTOMY  1965      Cardiac Cath  06/30/2018:  pLAD1 55% (not significant), p-mLAD2 80% & mLAD 70% (tandem lesions). mCx ~60%, mRCA  30% & 40%.  ER 55-60%.  p-mLAD2 80% & mLAD 70% (tandem lesions) - DES PCI Synergy DES 2.75 x 38 (3.0 mm).  mCx lesion & pLAD1 lesions - recommend Medical Management      Current Meds  Medication Sig  . amLODipine (NORVASC) 5 MG tablet Take 5 mg by mouth daily.  Marland Kitchen amphetamine-dextroamphetamine (ADDERALL) 30 MG tablet Take 1-2 tablets by mouth daily.  . Ascorbic Acid (VITAMIN C PO) Take 1 tablet by mouth 2 (two) times daily.  . B Complex-C (B-COMPLEX WITH VITAMIN C) tablet Take 1 tablet by mouth daily.  . clopidogrel (PLAVIX) 75 MG tablet Take 1 tablet (75 mg total) by mouth daily with breakfast.  . Coenzyme Q10 (COQ10) 100 MG CAPS Take 100 mg by mouth 2 (two) times daily.  Marland Kitchen LORazepam (ATIVAN) 1 MG tablet Take 2 tablets (2 mg total) by mouth at bedtime. (Patient taking differently: Take 1.5 mg by mouth at bedtime. )  . Misc  Natural Products (PROSTATE SUPPORT PO) Take 1 capsule by mouth every evening. Prostate Plus  . nitroGLYCERIN (NITROSTAT) 0.4 MG SL tablet Place 1 tablet (0.4 mg total) under the tongue every 5 (five) minutes as needed for chest pain.  . NON FORMULARY Take 1 capsule by mouth daily. MK-7 Supplements  . Omega-3 Fatty Acids (SUPER OMEGA 3 EPA/DHA PO) Take 1 capsule by mouth daily.  Marland Kitchen OVER THE COUNTER MEDICATION Take 1 capsule by mouth daily. Nitric oxide supplements  . testosterone cypionate (DEPOTESTOSTERONE CYPIONATE) 200 MG/ML injection Inject 0.3 ML once a week  . vitamin E 400 UNIT capsule Take 400 Units by mouth daily.  . [DISCONTINUED] amLODipine (NORVASC) 10 MG tablet Take 1 tablet (10 mg total) by mouth daily. (Patient taking differently: Take 5 mg by mouth daily. )  . [DISCONTINUED] aspirin EC 81 MG tablet Take 1 tablet (81 mg total) by mouth daily.     Allergies:   Penicillins, Statins, Methylprednisolone, Amphetamines, Lorazepam, and Propofol   Social History   Tobacco Use  . Smoking status: Former Smoker    Packs/day: 1.50    Years: 25.00    Pack  years: 37.50    Types: Cigarettes    Quit date: 11/22/2014    Years since quitting: 3.9  . Smokeless tobacco: Never Used  . Tobacco comment: Encouraged to remain smoke free  Substance Use Topics  . Alcohol use: No    Alcohol/week: 0.0 standard drinks  . Drug use: No     Family Hx: The patient's family history includes Cancer in his paternal grandfather; Heart disease (age of onset: 20) in his maternal grandfather; Hyperlipidemia in his father; Mental illness in his brother and mother.   Prior CV studies:   The following studies were reviewed today: . none  Labs/Other Tests and Data Reviewed:    EKG:  No ECG reviewed.  Recent Labs: 12/15/2017: TSH 2.67 03/02/2018: ALT 17 07/01/2018: BUN 17; Creatinine, Ser 0.98; Hemoglobin 14.8; Platelets 297; Potassium 3.8; Sodium 135   Recent Lipid Panel Lab Results  Component Value Date/Time   CHOL 211 (H) 12/15/2017 10:13 AM   TRIG 83.0 12/15/2017 10:13 AM   HDL 61.60 12/15/2017 10:13 AM   CHOLHDL 3 12/15/2017 10:13 AM   LDLCALC 133 (H) 12/15/2017 10:13 AM    Wt Readings from Last 3 Encounters:  10/27/18 188 lb (85.3 kg)  08/11/18 195 lb (88.5 kg)  07/14/18 193 lb 9.6 oz (87.8 kg)     Objective:    Vital Signs:  BP 138/78   Pulse 76   Ht 6\' 1"  (1.854 m)   Wt 188 lb (85.3 kg)   BMI 24.80 kg/m   VITAL SIGNS:  reviewed GEN:  no acute distress RESPIRATORY:  normal respiratory effort, symmetric expansion CARDIOVASCULAR:  no peripheral edema NEURO:  alert and oriented x 3, no obvious focal deficit PSYCH:  normal affect   ASSESSMENT & PLAN:    Problem List Items Addressed This Visit    Statin intolerance   Relevant Orders   Lipid panel   Comprehensive metabolic panel   Hyperlipidemia with target LDL less than 70 - Primary (Chronic)    Unfortunately, unable to tolerate rosuvastatin due to memory issues and fatigue. He has not had lipids checked in a while.  So we will go ahead and check a fasting lipid panel on him and  then have him follow-up with [facility] our to discuss possible options for been bempedoic acid (Nexletol). He apparently has had issues with PCSK9  inhibitors as well.  For now continue omega-3 fatty acids.       Relevant Medications   amLODipine (NORVASC) 5 MG tablet   Other Relevant Orders   Lipid panel   Comprehensive metabolic panel   Essential hypertension (Chronic)    Blood pressures be stable now.  He did not tolerate the higher dose of amlodipine.  For now no changes.  Continue amlodipine at current dose (5 mg)      Relevant Medications   amLODipine (NORVASC) 5 MG tablet   Coronary artery disease involving native coronary artery of native heart without angina pectoris (Chronic)    No recurrent anginal symptoms after PCI of the LAD.  Residual disease in the circumflex seems to be not physiologically significant. Not able to tolerate beta-blocker.  Is on amlodipine. Intolerant of statins, referral to [facility] for further management. Remains on Plavix without aspirin.   As of August 2020, would be okay to hold Plavix 5 to 7 days preop for procedures.      Relevant Medications   amLODipine (NORVASC) 5 MG tablet   Other Relevant Orders   Comprehensive metabolic panel   Cognitive impairment    Had issues with statins making his underlying cognitive impairment worse.  Also had issues with PCSK9 inhibitors.      Relevant Orders   Lipid panel   Comprehensive metabolic panel   CAD S/P percutaneous coronary angioplasty (Chronic)    Continue Plavix.  No longer on aspirin.  Okay to hold Plavix for procedures (5 to 7 days preop) as end of August/early September 2020      Relevant Medications   amLODipine (NORVASC) 5 MG tablet   Other Relevant Orders   Comprehensive metabolic panel      [id number] Education: The signs and symptoms of COVID-19 were discussed with the patient and how to seek care for testing (follow up with PCP or arrange E-visit).   The importance of social  distancing was discussed today.  Time:   Today, I have spent 24 minutes with the patient with telehealth technology discussing the above problems.     Medication Adjustments/Labs and Tests Ordered: Current medicines are reviewed at length with the patient today.  Concerns regarding medicines are outlined above.  Medication Instructions:  None for now.  Will refer to our pharmacy lead lipid clinic ([facility] ) to potentially start a new cholesterol medicine  Tests Ordered: Orders Placed This Encounter  Procedures  . Lipid panel  . Comprehensive metabolic panel    Medication Changes: No orders of the defined types were placed in this encounter.   Disposition:  Follow up 6-8 months    Signed, [name], MD  11/01/2018 4:55 PM    [facility]

## 2018-10-28 ENCOUNTER — Encounter: Payer: Self-pay | Admitting: Internal Medicine

## 2018-10-28 ENCOUNTER — Ambulatory Visit (INDEPENDENT_AMBULATORY_CARE_PROVIDER_SITE_OTHER): Payer: BC Managed Care – PPO | Admitting: Internal Medicine

## 2018-10-28 ENCOUNTER — Other Ambulatory Visit: Payer: Self-pay

## 2018-10-28 DIAGNOSIS — E230 Hypopituitarism: Secondary | ICD-10-CM | POA: Diagnosis not present

## 2018-10-28 DIAGNOSIS — N529 Male erectile dysfunction, unspecified: Secondary | ICD-10-CM

## 2018-10-28 DIAGNOSIS — E280 Estrogen excess: Secondary | ICD-10-CM

## 2018-10-28 NOTE — Progress Notes (Signed)
Patient ID: BOOMER WINDERS, male   DOB: 03/29/1956, 63 y.o.   MRN: [id number]   Patient location: Home My location: Office  I connected with the patient and his friend on 10/28/18 at 11:00 AM EDT by a video enabled telemedicine application and verified that I am speaking with the correct person.   I discussed the limitations of evaluation and management by telemedicine and the availability of in person appointments. The patient expressed understanding and agreed to proceed.  However, after an initial virtual connection was established, we had to convert this to a phone visit due to failing to the video connection.   Details of the encounter are shown below.  HPI: LUQMAN PERRELLI is a 63 y.o.-year-old man, initially referred by his neurologist, Dr. [name], presenting for follow-up for hypogonadism. Last visit 10 mo ago.  Patient has a history of a coronary stent placed in 06/2018 - after he had an elevated CAC score, after which he had to come off testosterone transiently.  With cardiology consent, we restarted testosterone supplement at 0.3 mL weekly as he started to feel poorly.  Of note, we tried to reduce the dose to 0.2 mL weekly, but he did not tolerate this well due to fatigue.    He denies breast tenderness.  He has ED, but libido is fair.  He is on PDE 5 inhibitors prescribed by Dr. Diona Fanti -urology.  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  He was given refills of generic sildenafil. 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 He had a subsequent visit with his PCP in 09/2015 >> a testosterone level was found to be  extremely low.   Received records from urology >> reviewed: Patient's initial free testosterone level obtained by PCP was 0.4 pg/mL (09/17/2015). At that time, he complained of low libido, testicular atrophy, difficulty obtaining and maintaining an erection. A repeat testosterone level obtained by an integrative medicine provider in [place] (09/25/2015) showed again very low testosterone level: Total Testosterone <3 ng/dL, and the free testosterone of 0.2 ng/dL. DHEAS was 122.4 (48.9-344.2). Of note, no LH and FSH levels were drawn at that time. At that point, he was started on testosterone cypionate 8 IM 0.8 mL every other week. This has not improved his libido significantly, but it helped him obtaining morning erections.  A genital exam was performed on 11/09/2015 by his urologist: Atrophic left and right testes. No tenderness, swelling, masses, varicocele, or hydrocele. No abnormality of the penis. Circumcised status. Prostate: 2+ in size, 40 g. Normal consistency  Dr. Diona Fanti obtain the following labs - 11/09/2015 - collection time: 3:34 PM  Total testosterone 1125.7 (300-890) Free testosterone 175.3 (47-244) SHBG 66.3 (10-57)  FSH 0.1, LH 0.1 Prolactin 6.3 Of note, at the time of the above collection, patient was taking 0.8 mL testosterone every other week in his deltoid. The dose was changed at that time to 0.5 mL every week in his quadriceps, after which his fatigue improved a little, and he had more energy.   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 He saw Dr. [name], who obtained a  brain  +  pituitary MRI (10/24/2015). 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 There was no abnormality in the pituitary gland.  He admits for previous decreased libido >> normal now Had difficulty obtaining  or maintaining an erection >> not anymore No trauma to testes, testicular irradiation or surgery No h/o of mumps orchitis/h/o autoimmune ds. No h/o cryptorchidism He grew and went through puberty like his peers + shrinking of testes. No very small testes (<5 ml) No incomplete/delayed sexual development     No breast discomfort/gynecomastia    No loss of body hair (axillary/pubic)/decreased need for shaving No height loss No abnormal sense of smell  + hot flushes, resolved No vision problems, other than blurry vision in R eye, also photopsia - started 11/2015 No worst HA of his life He does have a history of head trauma in 1997, when a tree fell on top of his head and he lost consciousness for more than 30 minutes; in 2009 he fell off ladder and landed on back of his head No FH of hypogonadism/infertility No personal h/o infertility - has 2 children: 75 and 38 years old  No FH of hemochromatosis or pituitary tumors No excessive weight gain or loss.  No chronic diseases, but recently diagnosed with RMSF Spring 2017 No chronic pain. Not on opiates, does not take steroids, had a course this spring - ~1 week He was drinking Alcohol: ~2 drinks a day up to 2015 >>  stopped completely since then No anabolic steroids use No herbal medicines. Not on antidepressants  No AI ds in his family, no FH of MS. He does not have family history of early cardiac disease.  Reviewed pertinent labs: Latest labs show a normal free testosterone and also his estradiol level has normalized: Component     Latest Ref Rng & Units 07/20/2017 10/12/2017 12/22/2017  Testosterone, Serum (Total)     ng/dL  718   % Free Testosterone     %  0.8   Free Testosterone, S     pg/mL  57   Sex Hormone Binding Globulin     nmol/L  83.5 (H)   Testosterone     264 - 916 ng/dL 350    Testosterone Free     7.2 - 24.0 pg/mL 4.1 (L)    Sex Horm Binding Glob, Serum     19.3 - 76.4 nmol/L 100.7 (H)    Estradiol, Free     pg/mL    0.38  Estradiol     pg/mL   19   Initially: Component     Latest Ref Rng & Units 01/28/2016  Testosterone     264 - 916 ng/dL 1,103 (H)  Testosterone Free     7.2 - 24.0 pg/mL 15.1  Sex Horm Binding Glob, Serum     19.3 - 76.4 nmol/L 80.8 (H)  TSH     0.35 - 4.50 uIU/mL 1.90  Triiodothyronine,Free,Serum     2.3 - 4.2 pg/mL 3.9  T4,Free(Direct)     0.60 - 1.60 ng/dL 0.80  FSH     1.4 - 18.1 mIU/ML 0.1 (L)  Cortisol, Plasma     ug/dL 13.1  LH     1.50 - 9.30 mIU/mL 0.03 (L)  hCG Quant     0 - 3 mIU/mL <1  Total testosterone level is elevated, however free testosterone is normal. SHBG is high, which is desirable. Cortisol level is  normal, no elevated beta hCG. Normal thyroid tests.  Component     Latest Ref Rng & Units 01/28/2016  IGF-I, LC/MS     50 - 317 ng/mL 140  Z-Score (Male)     -2.0 - 2.0 SD 0.1  Estradiol, Free     ADULTS: < OR = 0.45 pg/mL 1.06 (H)  Estradiol     ADULTS: < OR = 29 pg/mL 53 (H)  Prolactin     2.0 - 18.0 ng/mL 8.1   Labs normal with the exception of high estradiol.  PSA levels are normal: Lab Results  Component Value Date   PSA 0.51 10/25/2018   PSA 0.80 12/22/2017   PSA 0.59 12/19/2016   PSA 0.72 12/10/2016   PSA 0.57 04/12/2014   PSA 0.57 03/16/2013   Hematocrit normal at last check: Lab Results  Component Value Date   HCT 44.7 07/01/2018   ROS:   Constitutional: no weight gain/no weight loss, no fatigue, no subjective hyperthermia, no subjective hypothermia Eyes: no blurry vision, no xerophthalmia ENT: no sore throat, no nodules palpated in neck, no dysphagia, no odynophagia, no hoarseness Cardiovascular: no CP/no SOB/no palpitations/no leg swelling Respiratory: no cough/no SOB/no wheezing Gastrointestinal: no N/no V/no D/no C/no acid reflux Musculoskeletal: no muscle aches/no joint aches Skin: no rashes, no hair loss Neurological: no tremors/no numbness/no tingling/no dizziness  I reviewed pt's medications, allergies, PMH,  social hx, family hx, and changes were documented in the history of present illness. Otherwise, unchanged from my initial visit note.  Past Medical History:  Diagnosis Date  . ADD (attention deficit disorder)    on adderrall managed by neurology  . Anxiety    on ativan managed by Neurology  . 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 CAD S/P percutaneous coronary angioplasty 06/2017   pLAD1 55% (not significant), p-mLAD2 80% & mLAD 70% (tandem lesions - DES PCI . Synergy DES 2.75 x 38 -- 3.0 mm).  mCx ~60% - med Rx, mRCA 30% & 40%.  ER 55-60%.  . Cellulitis   . Chronic traumatic encephalopathy    right frontal-temproal lobe  . Complication of anesthesia    " BRAIN FOG "  . Coronary artery calcification seen on CAT scan 05/12/2018   Triple- by CT - lung ca screen 04/2018  Cardio 06/2018: Coronary calcium score results shows aortic atherosclerosis (with mild dilation) as well as left main and three-vessel coronary artery calcification. The calcium score is 1168 which is very high risk amount of calcium.  Based on these findings, I would recommend proceeding with a coronary CT angiogram (a more detailed study with Intravenous X  . Depression   . Erectile dysfunction 11/2016   follows with urology; sildenafil prescribed  . Meningoencephalitis 1994   [place]  . Pneumonia   . Primary hypogonadism in male   . Raynaud's disease 54/00/8676   As complication of RMSF  . Restless legs 09/13/2015  . [place] spotted fever 2017  . TBI (traumatic brain injury) ([place]) 1997, 2009   "concussions"   Past Surgical History:  Procedure Laterality Date  . CORONARY STENT INTERVENTION N/A 06/30/2018   Procedure: CORONARY STENT INTERVENTION;  Surgeon: [name], MD;  Location: [facility];;  p-mLAD2 80% & mLAD 70% (tandem lesions) - DES PCI Synergy DES 2.75 x 38 (3.0 mm).  . CT CTA CORONARY W/CA SCORE  W/CM &/OR WO/CM  06/2018  Cardiac cath score read 1318.  Moderate coronary disease sent for FFR: mRCA 0.84 (non-significant), mLAD 0.64 (signifiant), pLOM(Cx) - 0,72 (significant - but distal lesion)  . LEFT HEART CATH AND CORONARY ANGIOGRAPHY N/A 06/30/2018   Procedure: LEFT HEART CATH AND CORONARY ANGIOGRAPHY;  Surgeon: [name], MD;  Location: [facility];;  pLAD1 55% (not significant), p-mLAD2 80% & mLAD 70% (tandem lesions -> DES PCI). mCx ~60% (med Rx), mRCA 30% & 40%.  ER 55-60%.   . TEE WITHOUT CARDIOVERSION N/A 09/06/2015   Procedure: TRANSESOPHAGEAL ECHOCARDIOGRAM (TEE);  Surgeon: Jerline Pain, MD;  R/O endocarditis;  . TONSILLECTOMY  1965   Social History   Social History  . Marital status: Single    Spouse name: N/A  . Number of children: 2   Occupational History  . n/a   Social History Main Topics  . Smoking status: Former Smoker    Packs/day: 1.50    Years: 25.00    Types: Cigarettes    Quit date: 2002  . Smokeless tobacco: Never Used     Comment: Encouraged to remain smoke free  . Alcohol use No     Comment: 8 drinks  . Drug use: No   Social History Narrative   Divorced. Education: [employer].   Current Outpatient Medications on File Prior to Visit  Medication Sig Dispense Refill  . amLODipine (NORVASC) 5 MG tablet Take 5 mg by mouth daily.    Marland Kitchen amphetamine-dextroamphetamine (ADDERALL) 30 MG tablet Take 1-2 tablets by mouth daily. 60 tablet 0  . Ascorbic Acid (VITAMIN C PO) Take 1 tablet by mouth 2 (two) times daily.    . B Complex-C (B-COMPLEX WITH VITAMIN C) tablet Take 1 tablet by mouth daily.    . clopidogrel (PLAVIX) 75 MG tablet Take 1 tablet (75 mg total) by mouth daily with breakfast. 30 tablet 11  . Coenzyme Q10 (COQ10) 100 MG CAPS Take 100 mg by mouth 2 (two) times daily.    . Evolocumab (REPATHA SURECLICK) 782 MG/ML SOAJ Inject 140 mg into the skin every 14 (fourteen) days. (Patient not taking: Reported on 10/27/2018) 2 pen 11  . LORazepam  (ATIVAN) 1 MG tablet Take 2 tablets (2 mg total) by mouth at bedtime. (Patient taking differently: Take 1.5 mg by mouth at bedtime. ) 60 tablet 5  . Misc Natural Products (PROSTATE SUPPORT PO) Take 1 capsule by mouth every evening. Prostate Plus    . nitroGLYCERIN (NITROSTAT) 0.4 MG SL tablet Place 1 tablet (0.4 mg total) under the tongue every 5 (five) minutes as needed for chest pain. 25 tablet 3  . NON FORMULARY Take 1 capsule by mouth daily. MK-7 Supplements    . Omega-3 Fatty Acids (SUPER OMEGA 3 EPA/DHA PO) Take 1 capsule by mouth daily.    Marland Kitchen OVER THE COUNTER MEDICATION Take 1 capsule by mouth daily. Nitric oxide supplements    . testosterone cypionate (DEPOTESTOSTERONE CYPIONATE) 200 MG/ML injection Inject 0.3 ML once a week 10 mL 2  . vitamin E 400 UNIT capsule Take 400 Units by mouth daily.     No current facility-administered medications on file prior to visit.    Allergies  Allergen Reactions  . Penicillins Hives    Did it involve swelling of the face/tongue/throat, SOB, or low BP? Unknown Did it involve sudden or severe rash/hives, skin peeling, or any reaction on the inside of your mouth or nose? Yes Did you need to seek medical attention at a hospital or doctor's office?  Unknown When did it last happen?Occurred at age 11 or 52 If all above answers are "NO", may proceed with cephalosporin use.   . Statins Other (See Comments)    confusion  . Methylprednisolone Other (See Comments)    Unsure of exact reaction type  . Amphetamines Other (See Comments)    Intolerant to specific formulations: [employer] amphetamine TEVA dextroamp amphetamine  . Lorazepam Other (See Comments)    Mylan lorazepam; others ok  . Propofol Other (See Comments)    Cognitive delay and emotional   Family History  Problem Relation Age of Onset  . Hyperlipidemia Father   . Heart disease Maternal Grandfather 75  . Cancer Paternal Grandfather   . Mental illness Mother   . Mental illness Brother     PE: There were no vitals taken for this visit. Wt Readings from Last 3 Encounters:  10/27/18 188 lb (85.3 kg)  08/11/18 195 lb (88.5 kg)  07/14/18 193 lb 9.6 oz (87.8 kg)   Constitutional:  in NAD  The physical exam was not performed (virtual + phone visit).  ASSESSMENT: 1.  Hypogonadotropic hypogonadism - possible reasons for his very low initial testosterone: 1. Labs were drawn soon after diagnosis of RMSF, which, for him, was a fulminant, systemic, disease. Usually, the pituitary gland will shunt the production of testosterone or other hormones deemed "unnecessary" during acute illness. Testosterone levels could be quite low in this situation. 2. Labs were drawn also after a prednisone course, but I do not have a clear time sequence 3. He has a history of head trauma x2, which could have caused pituitary insufficiency 4. He has a history of alcohol use, however, he relates that he stopped in 2015  -Reviewed visit note from Dr. 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  He was given refills of generic sildenafil. -Was advised to continue 0.3 cc of testosterone cypionate weekly (200 mg / 1 mL) -On genital exam, he had mildly atrophic right and left testicles and a prostate of approximately 30 g,  without abnormalities  2. ED  PLAN:  1. Hypogonadism -History is obtained from the patient and also from his wife, who clarifies information about his symptoms, testosterone administration and past medical history -He was diagnosed with hypogonadotropic hypogonadism approximately 3 years ago, during an episode of RMSF, during which his testosterone was checked and it was in the castrate level.  He was started on testosterone afterwards and referred to me for further investigation.  We checked his pituitary status and his hormone levels along with a pituitary MRI were normal.  His LH and FSH were low, as expected on  testosterone treatment.  At that time, we discussed about the fact that his hypothalamic-pituitary-testicular axis will most likely start to recover, but it was unclear whether he would recover completely.  Indeed, he could not tolerate a decrease in the testosterone supplement dose under 0.3 mL weekly.  On this dose, his testosterone level was normal - latest level is from 10/2017.  He was interested in coming off testosterone, however, he was more tired when he was trying to reduce the dose and he opted to remain on testosterone.  The past about trying Clomid, which would have been an option especially since estrogen level was slightly high.  A repeat estrogen level, however, was normal.  -Since last visit he had a coronary stent placed 06/2018 and upon discharge from the hospital, his testosterone was discontinued.  I advised him to call his cardiologist and see if we could restart the testosterone and he obtained consent to restart this by Dr. Ellyn Hack.  We restarted testosterone at the above dose in 08/2018.  He tolerates this well and actually started to feel much better.  No breast tension, headaches, worsening hypertension. He had higher BP after the stent >> could decrease Amlodipine from 10 mg to 5 mg. -I advised him to come back for another testosterone check after he started testosterone again.  He had this 2 days ago, but the results are not back yet. Estradiol is also pending. His PSA has returned and this is normal.   2. ED -Vascular origin, worsening, per notes from Dr. Diona Fanti, his urologist - on PDE 5 inhibitor per Dr. Diona Fanti -he tolerates this well  - time spent with the patient and his friend: 11 min, of which >50% was spent in obtaining information about his symptoms, reviewing his previous labs, evaluations, and treatments, counseling him about his conditions (please see the discussed topics above), and developing a plan to further investigate and treat them; he had a number of  questions which I addressed.  Component     Latest Ref Rng & Units 10/25/2018  Testosterone, Serum (Total)     ng/dL 813  % Free Testosterone     % 1.3  Free Testosterone, S     pg/mL 106  Sex Hormone Binding Globulin     nmol/L 59.2  Estradiol, Free     pg/mL 0.46 (H)  Estradiol     ADULTS: < OR = 0.45 pg/mL 22  PSA     0.10 - 4.00 ng/mL 0.51   Testosterone levels normal.  Free estradiol only slightly elevated.  No change in regimen is needed.  [name], MD PhD [facility]

## 2018-10-29 ENCOUNTER — Telehealth (HOSPITAL_COMMUNITY): Payer: Self-pay

## 2018-10-29 NOTE — Telephone Encounter (Signed)
No response from pt, closed referral. °

## 2018-10-30 LAB — ESTRADIOL, FREE
Estradiol, Free: 0.46 pg/mL — ABNORMAL HIGH
Estradiol: 22 pg/mL

## 2018-11-01 LAB — TESTOSTERONE, FREE AND TOTAL (INCLUDES SHBG)-(MALES)
% Free Testosterone: 1.3 %
Free Testosterone, S: 106 pg/mL
Sex Hormone Binding Globulin: 59.2 nmol/L
Testosterone, Serum (Total): 813 ng/dL

## 2018-11-01 NOTE — Assessment & Plan Note (Signed)
No recurrent anginal symptoms after PCI of the LAD.  Residual disease in the circumflex seems to be not physiologically significant. Not able to tolerate beta-blocker.  Is on amlodipine. Intolerant of statins, referral to [facility] for further management. Remains on Plavix without aspirin.   As of August 2020, would be okay to hold Plavix 5 to 7 days preop for procedures.

## 2018-11-01 NOTE — Assessment & Plan Note (Signed)
Unfortunately, unable to tolerate rosuvastatin due to memory issues and fatigue. He has not had lipids checked in a while.  So we will go ahead and check a fasting lipid panel on him and then have him follow-up with [facility] our to discuss possible options for been bempedoic acid (Nexletol). He apparently has had issues with PCSK9 inhibitors as well.  For now continue omega-3 fatty acids.

## 2018-11-01 NOTE — Assessment & Plan Note (Signed)
Blood pressures be stable now.  He did not tolerate the higher dose of amlodipine.  For now no changes.  Continue amlodipine at current dose (5 mg)

## 2018-11-01 NOTE — Assessment & Plan Note (Signed)
Had issues with statins making his underlying cognitive impairment worse.  Also had issues with PCSK9 inhibitors.

## 2018-11-01 NOTE — Assessment & Plan Note (Signed)
Continue Plavix.  No longer on aspirin.  Okay to hold Plavix for procedures (5 to 7 days preop) as end of August/early September 2020

## 2018-11-04 ENCOUNTER — Other Ambulatory Visit: Payer: Self-pay | Admitting: Adult Health

## 2018-11-04 ENCOUNTER — Other Ambulatory Visit: Payer: Self-pay | Admitting: *Deleted

## 2018-11-04 NOTE — Telephone Encounter (Signed)
Sent refill med enounter.

## 2018-11-04 NOTE — Telephone Encounter (Signed)
Pt is requesting a refill of amphetamine-dextroamphetamine (ADDERALL) 30 MG tablet, to be sent to [id number] - [place], [place] - [address]

## 2018-11-04 NOTE — Telephone Encounter (Signed)
Drug registry checked , last fill 10-04-18 #60 Adderall 30mg  1-2 daily prn.

## 2018-11-08 MED ORDER — AMPHETAMINE-DEXTROAMPHETAMINE 30 MG PO TABS: 30.0000 mg | ORAL_TABLET | Freq: Every day | ORAL | 0 refills | Status: DC

## 2018-11-08 NOTE — Telephone Encounter (Signed)
Drug registry checked last fill 10-04-18 #60 adderall.

## 2018-11-08 NOTE — Addendum Note (Signed)
Addended by: Brandon Melnick on: 11/08/2018 10:56 AM   Modules accepted: Orders

## 2018-11-08 NOTE — Telephone Encounter (Signed)
Pt is calling on the status on his Adderal refill. [facility] states [facility] not received the request from 11/04/18 yet.

## 2018-11-10 ENCOUNTER — Ambulatory Visit (INDEPENDENT_AMBULATORY_CARE_PROVIDER_SITE_OTHER): Payer: BC Managed Care – PPO | Admitting: Physician Assistant

## 2018-11-10 ENCOUNTER — Encounter: Payer: Self-pay | Admitting: Physician Assistant

## 2018-11-10 ENCOUNTER — Telehealth: Payer: Self-pay | Admitting: General Practice

## 2018-11-10 ENCOUNTER — Other Ambulatory Visit: Payer: Self-pay

## 2018-11-10 VITALS — BP 140/78 | HR 72 | Temp 97.3°F | Resp 16 | Ht 73.0 in | Wt 186.0 lb

## 2018-11-10 DIAGNOSIS — Z20828 Contact with and (suspected) exposure to other viral communicable diseases: Secondary | ICD-10-CM

## 2018-11-10 DIAGNOSIS — Z20822 Contact with and (suspected) exposure to covid-19: Secondary | ICD-10-CM

## 2018-11-10 NOTE — Addendum Note (Signed)
Addended by: Dimple Nanas on: 11/10/2018 05:11 PM   Modules accepted: Orders

## 2018-11-10 NOTE — Progress Notes (Signed)
Virtual Visit via Video   I connected with patient on 11/10/18 at 11:30 AM EDT by a video enabled telemedicine application and verified that I am speaking with the correct person using two identifiers.  Location patient: Home Location provider: [facility], Office Persons participating in the virtual visit: Patient, Provider, [place] (Patina Moore)  I discussed the limitations of evaluation and management by telemedicine and the availability of in person appointments. The patient expressed understanding and agreed to proceed.  Subjective:   HPI:   Patient presents via Doxy.Me today to discuss COVID exposure. Patient endorses a close friend tested positive yesterday for COVID. Symptoms started Saturday and they had lunch together a few days before. Person in question was asymptomatic at that time. Patient notes feeling well at present. Denies fever, chills, chest congestion, SOB, URI symptoms, change in taste or smell or GI complaint. Was recommended he should be tested due to his heart history.  ROS:   See pertinent positives and negatives per HPI.  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   Encephalopathy, traumatic 10/08/2015   Raynaud's disease 10/08/2015   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   Anxiety    Cognitive impairment 01/31/2013   Encephalomeningitis  03/10/2012   ADD (attention deficit disorder) 03/10/2012    Social History   Tobacco Use   Smoking status: Former Smoker    Packs/day: 1.50    Years: 25.00    Pack years: 37.50    Types: Cigarettes    Quit date: 11/22/2014    Years since quitting: 3.9   Smokeless tobacco: Never Used   Tobacco comment: Encouraged to remain smoke free  Substance Use Topics   Alcohol use: No    Alcohol/week: 0.0 standard drinks    Current Outpatient Medications:    amLODipine (NORVASC) 5 MG tablet, Take 5 mg by mouth daily., Disp: , Rfl:    amphetamine-dextroamphetamine (ADDERALL) 30 MG tablet, Take 1-2 tablets by mouth daily., Disp: 60 tablet, Rfl: 0   Ascorbic Acid (VITAMIN C PO), Take 1 tablet by mouth 2 (two) times daily., Disp: , Rfl:    B Complex-C (B-COMPLEX WITH VITAMIN C) tablet, Take 1 tablet by mouth daily., Disp: , Rfl:    clopidogrel (PLAVIX) 75 MG tablet, Take 1 tablet (75 mg total) by mouth daily with breakfast., Disp: 30 tablet, Rfl: 11   Coenzyme Q10 (COQ10) 100 MG CAPS, Take 100 mg by mouth 2 (two) times daily., Disp: , Rfl:    Evolocumab (REPATHA SURECLICK) 465 MG/ML SOAJ, Inject 140 mg into the skin every 14 (fourteen) days., Disp: 2 pen, Rfl: 11   LORazepam (ATIVAN) 1 MG tablet, Take 2 tablets (2 mg total) by mouth at bedtime. (Patient taking differently: Take 1.5 mg by mouth at bedtime. ), Disp: 60 tablet, Rfl: 5   Misc Natural Products (PROSTATE SUPPORT PO), Take 1 capsule by mouth every evening. Prostate Plus, Disp: , Rfl:    nitroGLYCERIN (NITROSTAT) 0.4 MG SL tablet, Place 1 tablet (0.4 mg  total) under the tongue every 5 (five) minutes as needed for chest pain., Disp: 25 tablet, Rfl: 3   NON FORMULARY, Take 1 capsule by mouth daily. MK-7 Supplements, Disp: , Rfl:    Omega-3 Fatty Acids (SUPER OMEGA 3 EPA/DHA PO), Take 1 capsule by mouth daily., Disp: , Rfl:    OVER THE COUNTER MEDICATION, Take 1 capsule by mouth daily. Nitric oxide supplements, Disp: , Rfl:     testosterone cypionate (DEPOTESTOSTERONE CYPIONATE) 200 MG/ML injection, Inject 0.3 ML once a week, Disp: 10 mL, Rfl: 2   vitamin E 400 UNIT capsule, Take 400 Units by mouth daily., Disp: , Rfl:   Allergies  Allergen Reactions   Penicillins Hives    Did it involve swelling of the face/tongue/throat, SOB, or low BP? Unknown Did it involve sudden or severe rash/hives, skin peeling, or any reaction on the inside of your mouth or nose? Yes Did you need to seek medical attention at a hospital or doctor's office? Unknown When did it last happen?Occurred at age 33 or 31 If all above answers are NO, may proceed with cephalosporin use.    Statins Other (See Comments)    confusion   Methylprednisolone Other (See Comments)    Unsure of exact reaction type   Amphetamines Other (See Comments)    Intolerant to specific formulations: [employer] amphetamine TEVA dextroamp amphetamine   Lorazepam Other (See Comments)    Mylan lorazepam; others ok   Propofol Other (See Comments)    Cognitive delay and emotional    Objective:   BP 140/78    Pulse 72    Temp (!) 97.3 F (36.3 C) (Oral)    Resp 16    Ht 6\' 1"  (1.854 m)    Wt 186 lb (84.4 kg)    BMI 24.54 kg/m   Patient is well-developed, well-nourished in no acute distress.  Resting comfortably at home.  Head is normocephalic, atraumatic.  No labored breathing.  Speech is clear and coherent with logical contest.  Patient is alert and oriented at baseline.   Assessment and Plan:   1. Close Exposure to Covid-19 Virus Asymptomatic. Higher risk of complication. Will send for testing today. Patient enrolled om MyChart COVId monitoring program. He is to quarantine until results are in and we will make further determination at that time.  - MyChart COVID-19 home monitoring program; Future - Temperature monitoring; Future  Leeanne Rio, PA-C 11/10/2018

## 2018-11-10 NOTE — Progress Notes (Signed)
I have discussed the procedure for the virtual visit with the patient who has given consent to proceed with assessment and treatment.   Phillip [name], CMA     

## 2018-11-10 NOTE — Patient Instructions (Signed)
No AVS today as Doxy.

## 2018-11-10 NOTE — Telephone Encounter (Signed)
Pt has been scheduled for covid testing.  Scheduled appt with pt directly.  Pt was referred by: Brunetta Jeans, PA-C

## 2018-11-11 ENCOUNTER — Other Ambulatory Visit: Payer: BC Managed Care – PPO

## 2018-11-11 DIAGNOSIS — R6889 Other general symptoms and signs: Secondary | ICD-10-CM | POA: Diagnosis not present

## 2018-11-11 DIAGNOSIS — Z20822 Contact with and (suspected) exposure to covid-19: Secondary | ICD-10-CM

## 2018-11-15 LAB — NOVEL CORONAVIRUS, NAA: SARS-CoV-2, NAA: NOT DETECTED

## 2018-11-26 DIAGNOSIS — E291 Testicular hypofunction: Secondary | ICD-10-CM | POA: Diagnosis not present

## 2018-12-01 DIAGNOSIS — E291 Testicular hypofunction: Secondary | ICD-10-CM | POA: Diagnosis not present

## 2018-12-01 DIAGNOSIS — N5201 Erectile dysfunction due to arterial insufficiency: Secondary | ICD-10-CM | POA: Diagnosis not present

## 2018-12-02 ENCOUNTER — Telehealth: Payer: Self-pay | Admitting: Adult Health

## 2018-12-02 MED ORDER — AMPHETAMINE-DEXTROAMPHETAMINE 30 MG PO TABS: 30.0000 mg | ORAL_TABLET | Freq: Every day | ORAL | 0 refills | Status: DC

## 2018-12-02 NOTE — Telephone Encounter (Signed)
Drug registry checked;  Last fill 11-08-18 #60 Take 1-2 tabs daily.

## 2018-12-02 NOTE — Telephone Encounter (Signed)
Pt is needing a refill on his amphetamine-dextroamphetamine (ADDERALL) 30 MG tablet [facility] [place] Pt will not pick it up until the 6th of August

## 2018-12-06 NOTE — Telephone Encounter (Signed)
[name] is calling in stating pts adderall needs to be refilled, advised her per drug registry last fill date is 11/08/2018 with a quantity of 60 tabs he isnt able to fill until 12/09/2018. She then states there are 31 days in July and if I cant fix the problem I need to find someone who can.

## 2018-12-06 NOTE — Telephone Encounter (Signed)
Dr. [name] send the prescription on 7/30 and allowed for the patient to pick up the medication on 8/ 6.  I will allow the patient to pick up on 8/ 5.

## 2018-12-06 NOTE — Telephone Encounter (Addendum)
I called and LMVM for pt/ and [name] that yes (due to July having 31 days it is ok to pick up 12-08-18).  [facility] [place] and spoke to Maudie Mercury (ok to pick up 12-08-18). I spoke to pt and he is going out of town 12-09-18 and needed it prior to that.  He was appreciative.

## 2019-01-05 ENCOUNTER — Other Ambulatory Visit: Payer: Self-pay | Admitting: Adult Health

## 2019-01-05 NOTE — Telephone Encounter (Signed)
[name],Phillip has called for a refill for pt amphetamine-dextroamphetamine (ADDERALL) 30 MG tablet  [id number]

## 2019-01-05 NOTE — Addendum Note (Signed)
Addended by: Brandon Melnick on: 01/05/2019 05:06 PM   Modules accepted: Orders

## 2019-01-05 NOTE — Telephone Encounter (Signed)
Drug registry checked.  Last fill 12-08-18 # 60. Adderall 30mg .

## 2019-01-06 MED ORDER — AMPHETAMINE-DEXTROAMPHETAMINE 30 MG PO TABS: 30.0000 mg | ORAL_TABLET | Freq: Every day | ORAL | 0 refills | Status: DC

## 2019-01-06 NOTE — Telephone Encounter (Signed)
Ramey,Karen has called again to inquire about the status of the refill since it has not been filled just yet. Ms [name] states based on the amount of days in Aug this needs to be filled by tomorrow

## 2019-01-06 NOTE — Telephone Encounter (Signed)
LMVM for pt that prescription sent receipt confirmed received at 1457 on 01-06-19.  [facility] [place]

## 2019-01-13 DIAGNOSIS — T1502XA Foreign body in cornea, left eye, initial encounter: Secondary | ICD-10-CM | POA: Diagnosis not present

## 2019-01-13 DIAGNOSIS — H25013 Cortical age-related cataract, bilateral: Secondary | ICD-10-CM | POA: Diagnosis not present

## 2019-01-13 DIAGNOSIS — H2513 Age-related nuclear cataract, bilateral: Secondary | ICD-10-CM | POA: Diagnosis not present

## 2019-01-13 DIAGNOSIS — H52203 Unspecified astigmatism, bilateral: Secondary | ICD-10-CM | POA: Diagnosis not present

## 2019-02-02 ENCOUNTER — Other Ambulatory Visit: Payer: Self-pay | Admitting: Adult Health

## 2019-02-02 NOTE — Telephone Encounter (Signed)
Last fill adderall 30mg  1-2 tabs daily  #60  Last fill 01-06-19.

## 2019-02-02 NOTE — Telephone Encounter (Signed)
Pt is requesting a refill of amphetamine-dextroamphetamine (ADDERALL) 30 MG tablet, [id number] [place], [place] - [address]

## 2019-02-03 ENCOUNTER — Other Ambulatory Visit: Payer: Self-pay | Admitting: Neurology

## 2019-02-03 MED ORDER — AMPHETAMINE-DEXTROAMPHETAMINE 30 MG PO TABS: 30.0000 mg | ORAL_TABLET | Freq: Every day | ORAL | 0 refills | Status: DC

## 2019-02-03 NOTE — Telephone Encounter (Signed)
Drug Registry checked lorazepam 1mg  (take 2 tabs at bedtime) last fill #60 01-17-19. Spoke to pt, (was trying to get both prescriptions lined up for pick on same date).  He did have enough for 2 more weeks.

## 2019-02-08 ENCOUNTER — Encounter: Payer: Self-pay | Admitting: Family Medicine

## 2019-02-08 LAB — LIPID PANEL
Cholesterol: 197 (ref 0–200)
HDL: 79 — AB (ref 35–70)
LDL Cholesterol: 106
Triglycerides: 57 (ref 40–160)

## 2019-02-16 ENCOUNTER — Encounter: Payer: Self-pay | Admitting: Family Medicine

## 2019-02-16 NOTE — Telephone Encounter (Signed)
Patient send the following message:   Dr. Raoul Pitch, I hope all is well with you. I have been trying to find a therapist who works with patients with neuro issues. Dr. [name] with [facility] was reccomended to me. Can you provide me with a referral to her to expedite this process for me?  Thank you in advance. Sincerely,  Phillip [name]   Please advise, thank you

## 2019-02-23 ENCOUNTER — Ambulatory Visit (INDEPENDENT_AMBULATORY_CARE_PROVIDER_SITE_OTHER): Payer: BC Managed Care – PPO | Admitting: Family Medicine

## 2019-02-23 ENCOUNTER — Other Ambulatory Visit: Payer: Self-pay

## 2019-02-23 ENCOUNTER — Encounter: Payer: Self-pay | Admitting: Family Medicine

## 2019-02-23 VITALS — BP 132/82 | HR 76 | Temp 97.4°F | Resp 16 | Ht 73.0 in | Wt 186.4 lb

## 2019-02-23 DIAGNOSIS — R4189 Other symptoms and signs involving cognitive functions and awareness: Secondary | ICD-10-CM | POA: Diagnosis not present

## 2019-02-23 DIAGNOSIS — F900 Attention-deficit hyperactivity disorder, predominantly inattentive type: Secondary | ICD-10-CM

## 2019-02-23 DIAGNOSIS — F0781 Postconcussional syndrome: Secondary | ICD-10-CM

## 2019-02-23 DIAGNOSIS — G9389 Other specified disorders of brain: Secondary | ICD-10-CM

## 2019-02-23 DIAGNOSIS — G0490 Encephalitis and encephalomyelitis, unspecified: Secondary | ICD-10-CM

## 2019-02-23 NOTE — Progress Notes (Signed)
Phillip [name] , [birth year], 63 y.o., male MRN: [id number] Patient Care Team    Relationship Specialty Notifications Start End  [name], DO PCP - General Family Medicine  11/12/16   Phillip Man, MD PCP - Cardiology Cardiology Admissions 07/01/18   Phillip Cisco, MD Consulting Physician Rheumatology  04/11/16   Phillip Kingdom, MD Consulting Physician Internal Medicine  11/13/16    Comment: Testosterone  Phillip Gallo, MD Consulting Physician Urology  11/13/16   Phillip Givens, NP Registered Nurse Neurology  11/13/16    Comment: Dr. Rush Landmark, Phillip Farr, MD Consulting Physician Cardiology  11/13/16   Phillip Ada, MD Consulting Physician Gastroenterology  12/10/16   Phillip Man, MD Consulting Physician Cardiology  06/10/18     Chief Complaint  Patient presents with  . Referral    [facility] behavorial health- Phillip [name]      Subjective: Pt presents for an OV to discuss a referral to [place] specialist. Patient has difficulty managing his attention deficits and memory changes that are a sequela of his encephalopathy. He reports he is also more emotional. He feels embarrassed when his memory fails him and it has caused him to be less social. He reports he is hard on himself when these movements occur infrequently has some form of negative reaction with irritation or motion.  He would like to speak with a behavioral health specialist they can help him better understand his thought processes, provide him tools to cope with the changes he is experiencing and not have a negative reaction or be so hard on himself.  He states he is hard on himself and does not forgive himself for having these reactions, and then experiences guilt.  Depression screen [facility] 2/9 03/02/2018 12/15/2017 07/10/2017 12/10/2016 11/12/2016  Decreased Interest 0 0 0 0 0  Down, Depressed, Hopeless 0 0 0 0 0  PHQ - 2 Score 0 0 0 0 0  Altered sleeping - - - - -  Tired, decreased energy - - - - -  Change in  appetite - - - - -  Feeling bad or failure about yourself  - - - - -  Trouble concentrating - - - - -  Moving slowly or fidgety/restless - - - - -  Suicidal thoughts - - - - -  PHQ-9 Score - - - - -    Allergies  Allergen Reactions  . Penicillins Hives    Did it involve swelling of the face/tongue/throat, SOB, or low BP? Unknown Did it involve sudden or severe rash/hives, skin peeling, or any reaction on the inside of your mouth or nose? Yes Did you need to seek medical attention at a hospital or doctor's office? Unknown When did it last happen?Occurred at age 74 or 45 If all above answers are "NO", may proceed with cephalosporin use.   . Statins Other (See Comments)    confusion  . Methylprednisolone Other (See Comments)    Unsure of exact reaction type  . Amphetamines Other (See Comments)    Intolerant to specific formulations: [employer] amphetamine TEVA dextroamp amphetamine  . Lorazepam Other (See Comments)    Mylan lorazepam; others ok  . Propofol Other (See Comments)    Cognitive delay and emotional   Social History   Social History Narrative   Divorced. B.A. degree. Retired.   Drinks caffeine, uses herbal remedies, takes a daily vitamin.   Wears his seatbelt.  Smoke detector in the home.   Firearms locked in  the home.   Feels safe in relationships.    Past Medical History:  Diagnosis Date  . ADD (attention deficit disorder)    on adderrall managed by neurology  . Anxiety    on ativan managed by Neurology  . 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 CAD S/P percutaneous coronary angioplasty 06/2017   pLAD1 55% (not significant), p-mLAD2 80% & mLAD 70% (tandem lesions - DES PCI . Synergy DES 2.75 x 38 -- 3.0 mm).  mCx ~60% - med Rx, mRCA 30% & 40%.  ER 55-60%.  . Cellulitis   . Chronic traumatic encephalopathy    right frontal-temproal lobe  . Complication of anesthesia    "  BRAIN FOG "  . Coronary artery calcification seen on CAT scan 05/12/2018   Triple- by CT - lung ca screen 04/2018  Cardio 06/2018: Coronary calcium score results shows aortic atherosclerosis (with mild dilation) as well as left main and three-vessel coronary artery calcification. The calcium score is 1168 which is very high risk amount of calcium.  Based on these findings, I would recommend proceeding with a coronary CT angiogram (a more detailed study with Intravenous X  . Depression   . Erectile dysfunction 11/2016   follows with urology; sildenafil prescribed  . Meningoencephalitis 1994   [place]  . Pneumonia   . Primary hypogonadism in male   . Raynaud's disease [id number]   As complication of RMSF  . Restless legs 09/13/2015  . [place] spotted fever 2017  . TBI (traumatic brain injury) ([place]) 1997, 2009   "concussions"   Past Surgical History:  Procedure Laterality Date  . CORONARY STENT INTERVENTION N/A 06/30/2018   Procedure: CORONARY STENT INTERVENTION;  Surgeon: Phillip Man, MD;  Location: [facility];;  p-mLAD2 80% & mLAD 70% (tandem lesions) - DES PCI Synergy DES 2.75 x 38 (3.0 mm).  . CT CTA CORONARY W/CA SCORE W/CM &/OR WO/CM  06/2018   Cardiac cath score read 1318.  Moderate coronary disease sent for FFR: mRCA 0.84 (non-significant), mLAD 0.64 (signifiant), pLOM(Cx) - 0,72 (significant - but distal lesion)  . LEFT HEART CATH AND CORONARY ANGIOGRAPHY N/A 06/30/2018   Procedure: LEFT HEART CATH AND CORONARY ANGIOGRAPHY;  Surgeon: Phillip Man, MD;  Location: [facility];;  pLAD1 55% (not significant), p-mLAD2 80% & mLAD 70% (tandem lesions -> DES PCI). mCx ~60% (med Rx), mRCA 30% & 40%.  ER 55-60%.   . TEE WITHOUT CARDIOVERSION N/A 09/06/2015   Procedure: TRANSESOPHAGEAL ECHOCARDIOGRAM (TEE);  Surgeon: Jerline Pain, MD;  R/O endocarditis;  . TONSILLECTOMY  1965   Family History  Problem Relation Age of Onset  . Hyperlipidemia Father   . Heart disease  Maternal Grandfather 75  . Cancer Paternal Grandfather   . Mental illness Mother   . Mental illness Brother    Allergies as of 02/23/2019      Reactions   Penicillins Hives   Did it involve swelling of the face/tongue/throat, SOB, or low BP? Unknown Did it involve sudden or severe rash/hives, skin peeling, or any reaction on the inside of your mouth or nose? Yes Did you need to seek medical attention at a hospital or doctor's office? Unknown When did it last happen?Occurred at age 31 or 59 If all above answers are "NO", may proceed with cephalosporin use.   Statins Other (See Comments)   confusion  Methylprednisolone Other (See Comments)   Unsure of exact reaction type   Amphetamines Other (See Comments)   Intolerant to specific formulations: [employer] amphetamine TEVA dextroamp amphetamine   Lorazepam Other (See Comments)   Mylan lorazepam; others ok   Propofol Other (See Comments)   Cognitive delay and emotional      Medication List       Accurate as of February 23, 2019 11:59 PM. If you have any questions, ask your nurse or doctor.        amLODipine 5 MG tablet Commonly known as: NORVASC Take 5 mg by mouth daily.   amphetamine-dextroamphetamine 30 MG tablet Commonly known as: Adderall Take 1-2 tablets by mouth daily.   B-complex with vitamin C tablet Take 1 tablet by mouth daily.   clopidogrel 75 MG tablet Commonly known as: PLAVIX Take 1 tablet (75 mg total) by mouth daily with breakfast.   CoQ10 100 MG Caps Take 100 mg by mouth 2 (two) times daily.   Evolocumab 140 MG/ML Soaj Commonly known as: Repatha SureClick Inject [id number] mg into the skin every 14 (fourteen) days.   LORazepam 1 MG tablet Commonly known as: ATIVAN Take 2 tablets (2 mg total) by mouth at bedtime. What changed: how much to take   nitroGLYCERIN 0.4 MG SL tablet Commonly known as: Nitrostat Place 1 tablet (0.4 mg total) under the tongue every 5 (five) minutes as needed for chest pain.    NON FORMULARY Take 1 capsule by mouth daily. MK-7 Supplements   OVER THE COUNTER MEDICATION Take 1 capsule by mouth daily. Nitric oxide supplements   PROSTATE SUPPORT PO Take 1 capsule by mouth every evening. Prostate Plus   SUPER OMEGA 3 EPA/DHA PO Take 1 capsule by mouth daily.   testosterone cypionate 200 MG/ML injection Commonly known as: DEPOTESTOSTERONE CYPIONATE Inject 0.3 ML once a week   VITAMIN C PO Take 1 tablet by mouth 2 (two) times daily.   vitamin E 400 UNIT capsule Take 400 Units by mouth daily.       All past medical history, surgical history, allergies, family history, immunizations andmedications were updated in the EMR today and reviewed under the history and medication portions of their EMR.     ROS: Negative, with the exception of above mentioned in HPI   Objective:  BP 132/82 (BP Location: Right Arm, Patient Position: Sitting, Cuff Size: Normal)   Pulse 76   Temp (!) 97.4 F (36.3 C) (Temporal)   Resp 16   Ht 6\' 1"  (1.854 m)   Wt 186 lb 6 oz (84.5 kg)   SpO2 99%   BMI 24.59 kg/m  Body mass index is 24.59 kg/m. Gen: Afebrile. No acute distress. Nontoxic in appearance, well developed, well nourished.  HENT: AT. [place].  Eyes:Pupils Equal Round Reactive to light, Extraocular movements intact,  Conjunctiva without redness, discharge or icterus. Neuro:  Normal gait. PERLA. EOMi. Alert. Oriented x3  Psych: Normal affect, dress and demeanor. Normal speech. Normal thought content and judgment.  No exam data present No results found. No results found for this or any previous visit (from the past 24 hour(s)).  Assessment/Plan: YER BERENDES is a 63 y.o. male present for OV for   Attention deficit hyperactivity disorder (ADHD), predominantly inattentive type/Cognitive impairment/Cystic encephalomalacia/Encephalomeningitis/Encephalopathy, traumatic - [place] and his caretaker and friend [name] done some research on people they would like to be referred to  for this condition.  He states he has a friend who has some cognitive impairments and  sees the lobe our behavioral health therapist Phillip [name].  He would like to be referred to her to discuss his ADD/memory changes secondary to a chronic disease process in hopes to better understand his thought processes.  In doing so he hopes he can better cope with this condition and avoid such negative reactions.  He also hopes he will also be able to be easier on himself and not feels guilty for his memory changes or reactions.     Reviewed expectations re: course of current medical issues.  Discussed self-management of symptoms.  Outlined signs and symptoms indicating need for more acute intervention.  Patient verbalized understanding and all questions were answered.  Patient received an After-Visit Summary.    Orders Placed This Encounter  Procedures  . Ambulatory referral to Psychology     Note is dictated utilizing voice recognition software. Although note has been proof read prior to signing, occasional typographical errors still can be missed. If any questions arise, please do not hesitate to call for verification.   electronically signed by:  Howard Pouch, DO  [place]

## 2019-02-23 NOTE — Patient Instructions (Signed)
You should here from her office within 1-2 weeks to set up an appointment.   Great to see you today.

## 2019-02-24 ENCOUNTER — Encounter: Payer: Self-pay | Admitting: Family Medicine

## 2019-03-03 ENCOUNTER — Telehealth: Payer: Self-pay | Admitting: Adult Health

## 2019-03-03 MED ORDER — AMPHETAMINE-DEXTROAMPHETAMINE 30 MG PO TABS: 30.0000 mg | ORAL_TABLET | Freq: Every day | ORAL | 0 refills | Status: DC

## 2019-03-03 NOTE — Telephone Encounter (Signed)
I have routed this request to Dr Dohmeier for review. The pt is due for the medication and  registry was verified.  

## 2019-03-03 NOTE — Telephone Encounter (Signed)
Pt is requesting a refill of amphetamine-dextroamphetamine (ADDERALL) 30 MG tablet, [id number] [place], [place] - [address]

## 2019-03-04 MED ORDER — AMPHETAMINE-DEXTROAMPHETAMINE 30 MG PO TABS: 30.0000 mg | ORAL_TABLET | Freq: Every day | ORAL | 0 refills | Status: DC

## 2019-03-04 NOTE — Telephone Encounter (Signed)
Dr Dohmeier has called to request again that you please call in his refill for his   amphetamine-dextroamphetamine (ADDERALL) 30 MG tablet  , [id number] [place], [place] - [address] [place] [address] [place] [address]

## 2019-03-04 NOTE — Addendum Note (Signed)
Addended by: Larey Seat on: 03/04/2019 12:21 PM   Modules accepted: Orders

## 2019-03-04 NOTE — Telephone Encounter (Signed)
The requested Pharmacy is listed at [place].

## 2019-03-29 ENCOUNTER — Encounter: Payer: Self-pay | Admitting: Adult Health

## 2019-03-29 ENCOUNTER — Ambulatory Visit: Payer: BC Managed Care – PPO | Admitting: Adult Health

## 2019-03-29 ENCOUNTER — Other Ambulatory Visit: Payer: Self-pay

## 2019-03-29 VITALS — BP 124/70 | HR 76 | Temp 97.4°F | Ht 73.0 in | Wt 187.0 lb

## 2019-03-29 DIAGNOSIS — F901 Attention-deficit hyperactivity disorder, predominantly hyperactive type: Secondary | ICD-10-CM | POA: Diagnosis not present

## 2019-03-29 DIAGNOSIS — F5104 Psychophysiologic insomnia: Secondary | ICD-10-CM | POA: Diagnosis not present

## 2019-03-29 NOTE — Patient Instructions (Signed)
Your Plan:  Continue Adderall 60 mg daily  Decrease dose of ativan to 1 mg alternating with 1/2 mg each night for 2 weeks. Then decrease to 1/2 tablet nightly thereafter. If your symptoms worsen or you develop new symptoms please let us know.   Thank you for coming to see [place] at [facility]. I hope we have been able to provide you high quality care today.  You may receive a patient satisfaction survey over the next few weeks. We would appreciate your feedback and comments so that we may continue to improve ourselves and the health of our patients.

## 2019-03-29 NOTE — Progress Notes (Signed)
PATIENT: Phillip [name] DOB: 10-Aug-1955  REASON FOR VISIT: follow up HISTORY FROM: patient  HISTORY OF PRESENT ILLNESS: Today 03/29/19:  Phillip [name] is a 63 year old male with a history of insomnia and ADHD.  He returns today for follow-up.  He continues on Adderall 60 mg daily.  He reports that this continues to work well for him.  He has continue trying to wean off of Ativan.  He is now taking 1 1/2 mg alternating with 1 milligram every other night.  He states that this is working well for him.  He would like to continue to decrease his dose.  When he had his sleep study in 2014 he did have  periodic limb movement disorder.  He was tried on Requip but reports that it caused confusion.  Since then he has not been on any other medication.  He returns today for an evaluation.  HISTORY 09/20/18 :  Phillip [name] is a 63 year old male with a history of insomnia and ADHD.  He returns today for a virtual visit.  He states that in February he had 2 stents placed.  Since then he has had trouble with his blood pressure and cholesterol.  He is currently on Repatha for his cholesterol.  He states that his cardiologist is aware that he is on Adderall and he is okay with this.  The patient reports that he has not felt "great" since his procedure in February.  Reports an increase in fatigue and cognitive disturbance.  He has not seen his cardiologist since March.  The patient continues on Adderall 60 mg daily.  He is currently on Ativan 2 mg at bedtime.  Reports that he has tried to wean off of this in the past but was not successful.  He returns today for the virtual visit  REVIEW OF SYSTEMS: Out of a complete 14 system review of symptoms, the patient complains only of the following symptoms, and all other reviewed systems are negative.  See HPI  ALLERGIES: Allergies  Allergen Reactions  . Penicillins Hives    Did it involve swelling of the face/tongue/throat, SOB, or low BP? Unknown Did it involve  sudden or severe rash/hives, skin peeling, or any reaction on the inside of your mouth or nose? Yes Did you need to seek medical attention at a hospital or doctor's office? Unknown When did it last happen?Occurred at age 55 or 20 If all above answers are "NO", may proceed with cephalosporin use.   . Statins Other (See Comments)    confusion  . Methylprednisolone Other (See Comments)    Unsure of exact reaction type  . Amphetamines Other (See Comments)    Intolerant to specific formulations: [employer] amphetamine TEVA dextroamp amphetamine  . Lorazepam Other (See Comments)    Mylan lorazepam; others ok  . Propofol Other (See Comments)    Cognitive delay and emotional    HOME MEDICATIONS: Outpatient Medications Prior to Visit  Medication Sig Dispense Refill  . amLODipine (NORVASC) 5 MG tablet Take 5 mg by mouth daily.    Marland Kitchen amphetamine-dextroamphetamine (ADDERALL) 30 MG tablet Take 1-2 tablets by mouth daily. 60 tablet 0  . Ascorbic Acid (VITAMIN C PO) Take 1 tablet by mouth 2 (two) times daily.    . B Complex-C (B-COMPLEX WITH VITAMIN C) tablet Take 1 tablet by mouth daily.    . clopidogrel (PLAVIX) 75 MG tablet Take 1 tablet (75 mg total) by mouth daily with breakfast. 30 tablet 11  . Coenzyme Q10 (COQ10)  100 MG CAPS Take 100 mg by mouth 2 (two) times daily.    Marland Kitchen LORazepam (ATIVAN) 1 MG tablet Take 2 tablets (2 mg total) by mouth at bedtime. (Patient taking differently: Take 1 mg by mouth at bedtime. ) 60 tablet 5  . Misc Natural Products (PROSTATE SUPPORT PO) Take 1 capsule by mouth every evening. Prostate Plus    . nitroGLYCERIN (NITROSTAT) 0.4 MG SL tablet Place 1 tablet (0.4 mg total) under the tongue every 5 (five) minutes as needed for chest pain. 25 tablet 3  . NON FORMULARY Take 1 capsule by mouth daily. MK-7 Supplements    . Omega-3 Fatty Acids (SUPER OMEGA 3 EPA/DHA PO) Take 1 capsule by mouth daily.    Marland Kitchen testosterone cypionate (DEPOTESTOSTERONE CYPIONATE) 200 MG/ML  injection Inject 0.3 ML once a week 10 mL 2  . vitamin E 400 UNIT capsule Take 400 Units by mouth daily.    . Evolocumab (REPATHA SURECLICK) [id number] MG/ML SOAJ Inject 140 mg into the skin every 14 (fourteen) days. (Patient not taking: Reported on 02/23/2019) 2 pen 11  . OVER THE COUNTER MEDICATION Take 1 capsule by mouth daily. Nitric oxide supplements     No facility-administered medications prior to visit.     PAST MEDICAL HISTORY: Past Medical History:  Diagnosis Date  . ADD (attention deficit disorder)    on adderrall managed by neurology  . Anxiety    on ativan managed by Neurology  . 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 CAD S/P percutaneous coronary angioplasty 06/2017   pLAD1 55% (not significant), p-mLAD2 80% & mLAD 70% (tandem lesions - DES PCI . Synergy DES 2.75 x 38 -- 3.0 mm).  mCx ~60% - med Rx, mRCA 30% & 40%.  ER 55-60%.  . Cellulitis   . Chronic traumatic encephalopathy    right frontal-temproal lobe  . Complication of anesthesia    " BRAIN FOG "  . Coronary artery calcification seen on CAT scan 05/12/2018   Triple- by CT - lung ca screen 04/2018  Cardio 06/2018: Coronary calcium score results shows aortic atherosclerosis (with mild dilation) as well as left main and three-vessel coronary artery calcification. The calcium score is 1168 which is very high risk amount of calcium.  Based on these findings, I would recommend proceeding with a coronary CT angiogram (a more detailed study with Intravenous X  . Depression   . Erectile dysfunction 11/2016   follows with urology; sildenafil prescribed  . Meningoencephalitis 1994   [place]  . Pneumonia   . Primary hypogonadism in male   . Raynaud's disease [id number]   As complication of RMSF  . Restless legs 09/13/2015  . [place] spotted fever 2017  . TBI (traumatic brain injury) ([place]) 1997, 2009   "concussions"    PAST SURGICAL  HISTORY: Past Surgical History:  Procedure Laterality Date  . CORONARY STENT INTERVENTION N/A 06/30/2018   Procedure: CORONARY STENT INTERVENTION;  Surgeon: [name], MD;  [facility];;  p-mLAD2 80% & mLAD 70% (tandem lesions) - DES PCI Synergy DES 2.75 x 38 (3.0 mm).  . CT CTA CORONARY W/CA SCORE W/CM &/OR WO/CM  06/2018   Cardiac cath score read 1318.  Moderate coronary disease sent for FFR: mRCA 0.84 (non-significant), mLAD 0.64 (signifiant), pLOM(Cx) - 0,72 (significant - but distal lesion)  . LEFT HEART CATH AND CORONARY ANGIOGRAPHY N/A 06/30/2018  Procedure: LEFT HEART CATH AND CORONARY ANGIOGRAPHY;  Surgeon: [name], MD;  Location: [facility];;  pLAD1 55% (not significant), p-mLAD2 80% & mLAD 70% (tandem lesions -> DES PCI). mCx ~60% (med Rx), mRCA 30% & 40%.  ER 55-60%.   . TEE WITHOUT CARDIOVERSION N/A 09/06/2015   Procedure: TRANSESOPHAGEAL ECHOCARDIOGRAM (TEE);  Surgeon: Jerline Pain, MD;  R/O endocarditis;  . TONSILLECTOMY  1965    FAMILY HISTORY: Family History  Problem Relation Age of Onset  . Hyperlipidemia Father   . Heart disease Maternal Grandfather 75  . Cancer Paternal Grandfather   . Mental illness Mother   . Mental illness Brother     SOCIAL HISTORY: Social History   Socioeconomic History  . Marital status: Divorced    Spouse name: [name]  . Number of children: 2  . Years of education: 70  . Highest education level: Not on file  Occupational History  . Occupation: Retired  Scientific laboratory technician  . Financial resource strain: Not on file  . Food insecurity    Worry: Not on file    Inability: Not on file  . Transportation needs    Medical: Not on file    Non-medical: Not on file  Tobacco Use  . Smoking status: Former Smoker    Packs/day: 1.50    Years: 25.00    Pack years: 37.50    Types: Cigarettes    Quit date: 11/22/2014    Years since quitting: 4.3  . Smokeless tobacco: Never Used  . Tobacco comment: Encouraged to  remain smoke free  Substance and Sexual Activity  . Alcohol use: No    Alcohol/week: 0.0 standard drinks  . Drug use: No  . Sexual activity: Yes    Partners: Female  Lifestyle  . Physical activity    Days per week: Not on file    Minutes per session: Not on file  . Stress: Not on file  Relationships  . Social Herbalist on phone: Not on file    Gets together: Not on file    Attends religious service: Not on file    Active member of club or organization: Not on file    Attends meetings of clubs or organizations: Not on file    Relationship status: Not on file  . Intimate partner violence    Fear of current or ex partner: Not on file    Emotionally abused: Not on file    Physically abused: Not on file    Forced sexual activity: Not on file  Other Topics Concern  . Not on file  Social History Narrative   Divorced. B.A. degree. Retired.   Drinks caffeine, uses herbal remedies, takes a daily vitamin.   Wears his seatbelt.  Smoke detector in the home.   Firearms locked in the home.   Feels safe in relationships.       PHYSICAL EXAM  Vitals:   03/29/19 1131  BP: 124/70  Pulse: 76  Temp: (!) 97.4 F (36.3 C)  Weight: 187 lb (84.8 kg)  Height: 6\' 1"  (1.854 m)   Body mass index is 24.67 kg/m.  Generalized: Well developed, in no acute distress   Neurological examination  Mentation: Alert oriented to time, place, history taking. Follows all commands speech and language fluent Cranial nerve II-XII: Pupils were equal round reactive to light. Extraocular movements were full, visual field were full on confrontational test.  Head turning and shoulder shrug  were normal and symmetric.  Motor: The motor testing reveals 5 over 5 strength of all 4 extremities. Good symmetric motor tone is noted throughout.  Sensory: Sensory testing is intact to soft touch on all 4 extremities. No evidence of extinction is noted.  Coordination: Cerebellar testing reveals good  finger-nose-finger and heel-to-shin bilaterally.  Gait and station: Gait is normal. DIAGNOSTIC DATA (LABS, IMAGING, TESTING) - I reviewed patient records, labs, notes, testing and imaging myself where available.  Lab Results  Component Value Date   WBC 8.0 07/01/2018   HGB 14.8 07/01/2018   HCT 44.7 07/01/2018   MCV 91.8 07/01/2018   PLT 297 07/01/2018      Component Value Date/Time   NA 135 07/01/2018 2333   NA 141 06/28/2018 1117   K 3.8 07/01/2018 2333   CL 103 07/01/2018 2333   CO2 25 07/01/2018 2333   GLUCOSE 106 (H) 07/01/2018 2333   BUN 17 07/01/2018 2333   BUN 14 06/28/2018 1117   CREATININE 0.98 07/01/2018 2333   CREATININE 0.71 01/03/2015 1339   CALCIUM 9.2 07/01/2018 2333   PROT 6.8 03/02/2018 1131   ALBUMIN 4.6 03/02/2018 1131   AST 22 03/02/2018 1131   ALT 17 03/02/2018 1131   ALKPHOS 89 03/02/2018 1131   BILITOT 0.9 03/02/2018 1131   GFRNONAA >60 07/01/2018 2333   GFRNONAA >89 04/12/2014 1722   GFRAA >60 07/01/2018 2333   GFRAA >89 04/12/2014 1722   Lab Results  Component Value Date   CHOL 197 02/08/2019   HDL 79 (A) 02/08/2019   LDLCALC 106 02/08/2019   TRIG 57 02/08/2019   CHOLHDL 3 12/15/2017   Lab Results  Component Value Date   HGBA1C 5.5 12/15/2017   No results found for: [id number] Lab Results  Component Value Date   TSH 2.67 12/15/2017      ASSESSMENT AND PLAN 63 y.o. year old male  has a past medical history of ADD (attention deficit disorder), Anxiety, Atrophy, cortical (2017), Bartonella infection (2017), CAD S/P percutaneous coronary angioplasty (06/2017), Cellulitis, Chronic traumatic encephalopathy, Complication of anesthesia, Coronary artery calcification seen on CAT scan (05/12/2018), Depression, Erectile dysfunction (11/2016), Meningoencephalitis (1994), Pneumonia, Primary hypogonadism in male, Raynaud's disease (09/13/2015), Restless legs (09/13/2015), [facility] spotted fever (2017), and TBI (traumatic brain injury) ([place])  (1997, 2009). here with:  1.  Chronic insomnia 2.  ADHD  The patient will continue on Adderall 60 mg daily.  He will continue reducing his dose of Ativan.  He will now reduce down to taking 1 mg alternating with a half a milligram each night.  If this works well for him he can reduce down to half milligram each night.  He will follow-up in 6 months or sooner if needed.   [name], MSN, NP-C 03/29/2019, 11:31 AM [facility] [address] [place], [place] [address] [phone]

## 2019-03-30 ENCOUNTER — Other Ambulatory Visit: Payer: Self-pay | Admitting: Adult Health

## 2019-03-30 MED ORDER — AMPHETAMINE-DEXTROAMPHETAMINE 30 MG PO TABS: 30.0000 mg | ORAL_TABLET | Freq: Every day | ORAL | 0 refills | Status: DC

## 2019-03-30 MED ORDER — LORAZEPAM 1 MG PO TABS: ORAL_TABLET | ORAL | 5 refills | Status: DC

## 2019-03-30 NOTE — Addendum Note (Signed)
Addended by: Trudie Buckler on: 03/30/2019 02:02 PM   Modules accepted: Orders

## 2019-03-30 NOTE — Addendum Note (Signed)
Addended by: Brandon Melnick on: 03/30/2019 01:44 PM   Modules accepted: Orders

## 2019-03-30 NOTE — Telephone Encounter (Signed)
Pt friend(on DPR) has called for a refill on pt's:amphetamine-dextroamphetamine (ADDERALL) 30 MG tablet [facility] #[id number]

## 2019-03-30 NOTE — Telephone Encounter (Signed)
Drug registry checked. Last fill adderall 03-04-19 #60.

## 2019-04-12 ENCOUNTER — Encounter: Payer: Self-pay | Admitting: Family Medicine

## 2019-04-12 ENCOUNTER — Ambulatory Visit: Payer: Self-pay

## 2019-04-12 ENCOUNTER — Other Ambulatory Visit: Payer: Self-pay

## 2019-04-12 ENCOUNTER — Ambulatory Visit: Payer: BC Managed Care – PPO | Admitting: Family Medicine

## 2019-04-12 DIAGNOSIS — M25511 Pain in right shoulder: Secondary | ICD-10-CM | POA: Diagnosis not present

## 2019-04-12 MED ORDER — MELOXICAM 15 MG PO TABS: 7.5000 mg | ORAL_TABLET | Freq: Every day | ORAL | 6 refills | Status: DC | PRN

## 2019-04-12 NOTE — Progress Notes (Signed)
IZEK TALSMA - 63 y.o. male MRN [id number]  Date of birth: 02-28-1956  Office Visit Note: Visit Date: 04/12/2019 PCP: [name], DO Referred by: [name], DO  Subjective: Chief Complaint  Patient presents with  . Right Shoulder - Pain   HPI: Phillip [name] is a 63 y.o. right hand dominant male who comes in today with right shoulder pain for the past month. He reports that he was carrying logs and felt a pop in his shoulder. He has been to [place] PT who has done dry needling and rehab exercises which have helped briefly but pain returned when he would aggravate shoulder with a certain movement. Pain is interfering with sleep at night. Overhead motions aggravate pain. He has tried ibuprofen/tylenol with minimal relief. He took a prescription strength anti-inflammatory that helped, does not recall name.    ROS Otherwise per HPI.  Assessment & Plan: Visit Diagnoses:  1. Right shoulder pain, unspecified chronicity     Plan:  Supraspinatus tendinopathy on ultrasound and exam with impingement as well. X-ray showed mild glenohumeral and AC joint arthritis. Elected for subacromial injection today to settle down pain.  Will prescribe meloxicam. Continue PT exercises 2-3 days a week.   Meds & Orders:  Orders Placed This Encounter  Procedures  . XR Shoulder Right  . MSK [place] - NO CHARGES    Follow-up: No follow-ups on file.   Procedures: No procedures performed  No notes on file   Clinical History: No specialty comments available.   He reports that he quit smoking about 4 years ago. His smoking use included cigarettes. He has a 37.50 pack-year smoking history. He has never used smokeless tobacco. No results for input(s): HGBA1C, LABURIC in the last 8760 hours.  Objective:  VS:  HT:    WT:   BMI:     BP:   HR: bpm  TEMP: ( )  RESP:  Physical Exam  PHYSICAL EXAM: Gen: NAD, alert, cooperative with exam, well-appearing HEENT: clear conjunctiva,  CV:  no edema,  capillary refill brisk, normal rate Resp: non-labored Skin: no rashes, normal turgor  Neuro: no gross deficits.  Psych:  alert and oriented  Ortho Exam  Right Shoulder: Inspection reveals no obvious deformity, atrophy, or asymmetry. No bruising. No swelling Palpation is normal with no TTP over [facility] joint or bicipital groove. Full ROM in flexion, abduction, internal/external rotation NV intact distally Normal scapular function observed. Special Tests:  - Impingement: Positive Hawkins, neers, empty can sign. - Supraspinatous: Positve empty can.  5/5 strength with resisted flexion at 20 degrees-- with pain - Infraspinatous/[place]res [place]: 5/5 strength with ER - Subscapularis: 5/5 strength with IR  - Biceps tendon: Negative Speeds, mild pain with Yerrgason's  - Labrum: Negative Obriens - AC Joint: Negative cross arm - painful arc  Imaging: Procedure performed: subacromial corticosteroid injection; palpation guided  Consent obtained and verified. Time-out conducted. Noted no overlying erythema, induration, or other signs of local infection. The right posterior subacromial space was palpated and marked. The overlying skin was prepped in a sterile fashion. Topical analgesic spray: Ethyl chloride. Joint: right subacromial Needle: 25 gauge, 1.5 inch Completed without difficulty. Meds: 3 cc 1% lidocaine without epinephrine, 40 mg depo-medrol   Past Medical/Family/Surgical/Social History: Medications & Allergies reviewed per EMR, new medications updated. Patient Active Problem List   Diagnosis Date Noted  . Essential hypertension 08/11/2018  . Statin intolerance 08/11/2018  . Coronary artery disease involving native coronary artery of native heart  without angina pectoris 07/18/2018  . CAD S/P percutaneous coronary angioplasty 06/30/2018  . Abnormal computed tomography angiography (CTA)   . Hyperlipidemia with target LDL less than 70 05/22/2018  . 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  Chronic prescription benzodiazepine use 09/14/2017  . Chronic insomnia 09/14/2017  . Accidental testosterone overdose 03/16/2017  . Erectile dysfunction 12/09/2016  . 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 Encephalopathy, traumatic 10/08/2015  . Raynaud's disease 10/08/2015  . 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 Anxiety   . Cognitive impairment 01/31/2013  . 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 ADD (attention deficit disorder) 03/10/2012   Past Medical History:  Diagnosis Date  . ADD (attention deficit disorder)    on adderrall managed by neurology  . Anxiety    on ativan managed by Neurology  . 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 CAD S/P percutaneous coronary angioplasty 06/2017   pLAD1 55% (not significant), p-mLAD2 80% & mLAD 70% (tandem lesions - DES PCI . Synergy DES 2.75 x 38 -- 3.0 mm).  mCx ~60% - med Rx, mRCA 30% & 40%.  ER 55-60%.  . Cellulitis   . Chronic traumatic encephalopathy    right frontal-temproal lobe  . Complication of anesthesia    " BRAIN FOG "  . Coronary artery calcification seen on CAT scan 05/12/2018   Triple- by CT - lung ca screen 04/2018  Cardio 06/2018: Coronary calcium score results shows aortic atherosclerosis (with mild dilation) as well as left main and three-vessel coronary artery calcification. The calcium score is 1168 which is very high risk amount of calcium.  Based on these findings, I would recommend proceeding with a coronary CT angiogram (a more detailed study with Intravenous X  . Depression   . Erectile dysfunction 11/2016   follows with urology; sildenafil prescribed  . Meningoencephalitis 1994   [place]  . Pneumonia   . Primary hypogonadism in male   . Raynaud's disease [id number]   As complication of RMSF  . Restless legs 09/13/2015  . Rocky Mountain spotted fever 2017  . TBI (traumatic brain injury) ([place]) 1997, 2009   "concussions"    Family History  Problem Relation Age of Onset  . Hyperlipidemia Father   . Heart disease Maternal Grandfather 75  . Cancer Paternal Grandfather   . Mental illness Mother   . Mental illness Brother    Past Surgical History:  Procedure Laterality Date  . CORONARY STENT INTERVENTION N/A 06/30/2018   Procedure: CORONARY STENT INTERVENTION;  Surgeon: [name], MD;  [facility];;  p-mLAD2 80% & mLAD 70% (tandem lesions) - DES PCI Synergy DES 2.75 x 38 (3.0 mm).  . CT CTA CORONARY W/CA SCORE W/CM &/OR WO/CM  06/2018   Cardiac cath score read 1318.  Moderate coronary disease sent for FFR: mRCA 0.84 (non-significant), mLAD 0.64 (signifiant), pLOM(Cx) - 0,72 (significant - but distal lesion)  . LEFT HEART CATH AND CORONARY ANGIOGRAPHY N/A 06/30/2018   Procedure: LEFT HEART CATH AND CORONARY ANGIOGRAPHY;  Surgeon: [name], MD;  [facility];;  pLAD1 55% (not significant), p-mLAD2 80% & mLAD 70% (tandem lesions -> DES PCI). mCx ~60% (med Rx), mRCA 30% & 40%.  ER 55-60%.   . TEE WITHOUT CARDIOVERSION N/A 09/06/2015   Procedure: TRANSESOPHAGEAL ECHOCARDIOGRAM (TEE);  Surgeon: Jerline Pain, MD;  R/O endocarditis;  . TONSILLECTOMY  1965   Social History  Occupational History  . Occupation: Retired  Tobacco Use  . Smoking status: Former Smoker    Packs/day: 1.50    Years: 25.00    Pack years: 37.50    Types: Cigarettes    Quit date: 11/22/2014    Years since quitting: 4.3  . Smokeless tobacco: Never Used  . Tobacco comment: Encouraged to remain smoke free  Substance and Sexual Activity  . Alcohol use: No    Alcohol/week: 0.0 standard drinks  . Drug use: No  . Sexual activity: Yes    Partners: Female

## 2019-04-12 NOTE — Progress Notes (Signed)
I saw and examined the patient with Dr. Mayer Masker and agree with assessment and plan as outlined.    Right shoulder impingement, no definite RCT seen on [place].  Subacromial injection today.  Home exercises next week.  MRI if fails to improve.

## 2019-04-13 ENCOUNTER — Other Ambulatory Visit: Payer: Self-pay | Admitting: Cardiology

## 2019-04-13 NOTE — Telephone Encounter (Signed)
This is Dr. Harding's pt. °

## 2019-04-19 ENCOUNTER — Encounter: Payer: Self-pay | Admitting: Family Medicine

## 2019-04-19 ENCOUNTER — Telehealth: Payer: Self-pay | Admitting: Family Medicine

## 2019-04-19 ENCOUNTER — Ambulatory Visit (INDEPENDENT_AMBULATORY_CARE_PROVIDER_SITE_OTHER): Payer: BC Managed Care – PPO | Admitting: Family Medicine

## 2019-04-19 VITALS — BP 145/84 | HR 79 | Temp 98.7°F | Ht 73.0 in

## 2019-04-19 DIAGNOSIS — B349 Viral infection, unspecified: Secondary | ICD-10-CM | POA: Diagnosis not present

## 2019-04-19 DIAGNOSIS — R05 Cough: Secondary | ICD-10-CM

## 2019-04-19 DIAGNOSIS — R0981 Nasal congestion: Secondary | ICD-10-CM | POA: Diagnosis not present

## 2019-04-19 DIAGNOSIS — R059 Cough, unspecified: Secondary | ICD-10-CM

## 2019-04-19 NOTE — Telephone Encounter (Signed)
Patient made an appointment for tomorrow but wanted to know if there is any way he can have a virtual visit today.

## 2019-04-19 NOTE — Telephone Encounter (Signed)
Pt is scheduled for today at 3:15.

## 2019-04-19 NOTE — Progress Notes (Signed)
VIRTUAL VISIT VIA VIDEO  I connected with Phillip [name] on 04/19/19 at  3:15 PM EST by a video enabled telemedicine application and verified that I am speaking with the correct person using two identifiers. Location patient: Home Location provider: [facility], Office Persons participating in the virtual visit: Patient, Dr. Raoul Pitch and R.Baker, LPN  I discussed the limitations of evaluation and management by telemedicine and the availability of in person appointments. The patient expressed understanding and agreed to proceed.  Interactive audio and video telecommunications were attempted between this provider and patient, however failed, due to patient having technical difficulties OR patient did not have access to video capability. We continued and completed visit with audio only.    Phillip [name] , 1955/10/03, 63 y.o., male MRN: [id number] Patient Care Team    Relationship Specialty Notifications Start End  [name], DO PCP - General Family Medicine  11/12/16   [name], MD PCP - Cardiology Cardiology Admissions 07/01/18   [name], MD Consulting Physician Rheumatology  04/11/16   [name], MD Consulting Physician Internal Medicine  11/13/16    Comment: Testosterone  [name], MD Consulting Physician Urology  11/13/16   [name], NP Registered Nurse Neurology  11/13/16    Comment: Dr. Rush Landmark, [name], MD Consulting Physician Cardiology  11/13/16   [name], MD Consulting Physician Gastroenterology  12/10/16   [name], MD Consulting Physician Cardiology  06/10/18     Chief Complaint  Patient presents with  . Nasal Congestion    Pt has been feeling sick since Sat evening. Denies SOB. Denies fever. No loss of taste or smell. [facility]   . Cough  . Fatigue     Subjective: Pt presents for an OV with complaints of acute illness since Saturday December 12.  He  reports having a low-grade fever, decreased appetite, nasal congestion, mild cough and feeling fatigued.   [facility]  He has had no known COVID-19 exposures. He denies shortness of breath, GI symptoms or loss of taste or smell.  Depression screen [facility] 2/9 03/02/2018 12/15/2017 07/10/2017 12/10/2016 11/12/2016  Decreased Interest 0 0 0 0 0  Down, Depressed, Hopeless 0 0 0 0 0  PHQ - 2 Score 0 0 0 0 0  Altered sleeping - - - - -  Tired, decreased energy - - - - -  Change in appetite - - - - -  Feeling bad or failure about yourself  - - - - -  Trouble concentrating - - - - -  Moving slowly or fidgety/restless - - - - -  Suicidal thoughts - - - - -  PHQ-9 Score - - - - -    Allergies  Allergen Reactions  . Penicillins Hives    Did it involve swelling of the face/tongue/throat, SOB, or low BP? Unknown Did it involve sudden or severe rash/hives, skin peeling, or any reaction on the inside of your mouth or nose? Yes Did you need to seek medical attention at a hospital or doctor's office? Unknown When did it last happen?Occurred at age 88 or 38 If all above answers are "NO", may proceed with cephalosporin use.   . Statins Other (See Comments)    confusion  . Methylprednisolone Other (See Comments)    Unsure of exact reaction type  . Amphetamines Other (See Comments)  Intolerant to specific formulations: [employer] amphetamine TEVA dextroamp amphetamine  . Lorazepam Other (See Comments)    Mylan lorazepam; others ok  . Propofol Other (See Comments)    Cognitive delay and emotional   Social History   Social History Narrative   Divorced. B.A. degree. Retired.   Drinks caffeine, uses herbal remedies, takes a daily vitamin.   Wears his seatbelt.  Smoke detector in the home.   Firearms locked in the home.   Feels safe in relationships.    Past Medical History:  Diagnosis Date  . ADD (attention deficit disorder)    on adderrall managed by neurology  . Anxiety     on ativan managed by Neurology  . 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 CAD S/P percutaneous coronary angioplasty 06/2017   pLAD1 55% (not significant), p-mLAD2 80% & mLAD 70% (tandem lesions - DES PCI . Synergy DES 2.75 x 38 -- 3.0 mm).  mCx ~60% - med Rx, mRCA 30% & 40%.  ER 55-60%.  . Cellulitis   . Chronic traumatic encephalopathy    right frontal-temproal lobe  . Complication of anesthesia    " BRAIN FOG "  . Coronary artery calcification seen on CAT scan 05/12/2018   Triple- by CT - lung ca screen 04/2018  Cardio 06/2018: Coronary calcium score results shows aortic atherosclerosis (with mild dilation) as well as left main and three-vessel coronary artery calcification. The calcium score is 1168 which is very high risk amount of calcium.  Based on these findings, I would recommend proceeding with a coronary CT angiogram (a more detailed study with Intravenous X  . Depression   . Erectile dysfunction 11/2016   follows with urology; sildenafil prescribed  . Meningoencephalitis 1994   [place]  . Pneumonia   . Primary hypogonadism in male   . Raynaud's disease [id number]   As complication of RMSF  . Restless legs 09/13/2015  . [place] spotted fever 2017  . TBI (traumatic brain injury) ([place]) 1997, 2009   "concussions"   Past Surgical History:  Procedure Laterality Date  . CORONARY STENT INTERVENTION N/A 06/30/2018   Procedure: CORONARY STENT INTERVENTION;  Surgeon: [name], MD;  [facility];;  p-mLAD2 80% & mLAD 70% (tandem lesions) - DES PCI Synergy DES 2.75 x 38 (3.0 mm).  . CT CTA CORONARY W/CA SCORE W/CM &/OR WO/CM  06/2018   Cardiac cath score read 1318.  Moderate coronary disease sent for FFR: mRCA 0.84 (non-significant), mLAD 0.64 (signifiant), pLOM(Cx) - 0,72 (significant - but distal lesion)  . LEFT HEART CATH AND CORONARY ANGIOGRAPHY N/A 06/30/2018    Procedure: LEFT HEART CATH AND CORONARY ANGIOGRAPHY;  Surgeon: [name], MD;  Location: [facility];;  pLAD1 55% (not significant), p-mLAD2 80% & mLAD 70% (tandem lesions -> DES PCI). mCx ~60% (med Rx), mRCA 30% & 40%.  ER 55-60%.   . TEE WITHOUT CARDIOVERSION N/A 09/06/2015   Procedure: TRANSESOPHAGEAL ECHOCARDIOGRAM (TEE);  Surgeon: Jerline Pain, MD;  R/O endocarditis;  . TONSILLECTOMY  1965   Family History  Problem Relation Age of Onset  . Hyperlipidemia Father   . Heart disease Maternal Grandfather 75  . Cancer Paternal Grandfather   . Mental illness Mother   . Mental illness Brother    Allergies as of 04/19/2019      Reactions   Penicillins Hives   Did it  involve swelling of the face/tongue/throat, SOB, or low BP? Unknown Did it involve sudden or severe rash/hives, skin peeling, or any reaction on the inside of your mouth or nose? Yes Did you need to seek medical attention at a hospital or doctor's office? Unknown When did it last happen?Occurred at age 36 or 23 If all above answers are "NO", may proceed with cephalosporin use.   Statins Other (See Comments)   confusion   Methylprednisolone Other (See Comments)   Unsure of exact reaction type   Amphetamines Other (See Comments)   Intolerant to specific formulations: [employer] amphetamine TEVA dextroamp amphetamine   Lorazepam Other (See Comments)   Mylan lorazepam; others ok   Propofol Other (See Comments)   Cognitive delay and emotional      Medication List       Accurate as of April 19, 2019  3:30 PM. If you have any questions, ask your nurse or doctor.        amLODipine 5 MG tablet Commonly known as: NORVASC TAKE 1 TABLET BY MOUTH EVERY DAY   amphetamine-dextroamphetamine 30 MG tablet Commonly known as: Adderall Take 1-2 tablets by mouth daily.   B-complex with vitamin C tablet Take 1 tablet by mouth daily.   clopidogrel 75 MG tablet Commonly known as: PLAVIX Take 1 tablet (75 mg total)  by mouth daily with breakfast.   CoQ10 100 MG Caps Take 100 mg by mouth 2 (two) times daily.   Evolocumab 140 MG/ML Soaj Commonly known as: Repatha SureClick Inject [id number] mg into the skin every 14 (fourteen) days.   LORazepam 1 MG tablet Commonly known as: ATIVAN Alternate take 1 mg one night, next night take 1/2 mg and so on.   meloxicam 15 MG tablet Commonly known as: MOBIC Take 0.5-1 tablets (7.5-15 mg total) by mouth daily as needed for pain.   nitroGLYCERIN 0.4 MG SL tablet Commonly known as: Nitrostat Place 1 tablet (0.4 mg total) under the tongue every 5 (five) minutes as needed for chest pain.   NON FORMULARY Take 1 capsule by mouth daily. MK-7 Supplements   OVER THE COUNTER MEDICATION Take 1 capsule by mouth daily. Nitric oxide supplements   PROSTATE SUPPORT PO Take 1 capsule by mouth every evening. Prostate Plus   SUPER OMEGA 3 EPA/DHA PO Take 1 capsule by mouth daily.   testosterone cypionate 200 MG/ML injection Commonly known as: DEPOTESTOSTERONE CYPIONATE Inject 0.3 ML once a week   VITAMIN C PO Take 1 tablet by mouth 2 (two) times daily.   vitamin E 400 UNIT capsule Take 400 Units by mouth daily.       All past medical history, surgical history, allergies, family history, immunizations andmedications were updated in the EMR today and reviewed under the history and medication portions of their EMR.     ROS: Negative, with the exception of above mentioned in HPI   Objective:  BP (!) 145/84   Pulse 79   Temp 98.7 F (37.1 C) (Oral)   Ht 6\' 1"  (1.854 m)   BMI 24.67 kg/m  Body mass index is 24.67 kg/m. Gen: Afebrile. No acute distress. Nontoxic in appearance HENT: AT. [place].  Neuro:  Alert. Oriented x3   No exam data present No results found. No results found for this or any previous visit (from the past 24 hour(s)).  Assessment/Plan: Phillip [name] is a 64 y.o. male present for OV for  Nasal congestion/Cough/Viral illness Rest, hydrate.    +/- flonase, mucinex (DM if cough),  nettie pot or nasal saline.  Discussed Covid isolation guidelines. If positive test self-isolation until December 26.  At which time isolation may end as long as he has been symptom-free for greater than 72 hours. Discussed over-the-counter agents that will help with symptoms. Discussed need for emergent care if any shortness of breath occurs. He will E chart [place] a message on his [facility]    Reviewed expectations re: course of current medical issues.  Discussed self-management of symptoms.  Outlined signs and symptoms indicating need for more acute intervention.  Patient verbalized understanding and all questions were answered.  Patient received an After-Visit Summary.    No orders of the defined types were placed in this encounter.  > 15 minutes spent with patient, > 50% of that time face to face   Note is dictated utilizing voice recognition software. Although note has been proof read prior to signing, occasional typographical errors still can be missed. If any questions arise, please do not hesitate to call for verification.   electronically signed by:  Howard Pouch, DO  [place]

## 2019-04-20 ENCOUNTER — Ambulatory Visit: Payer: BC Managed Care – PPO | Admitting: Family Medicine

## 2019-04-20 DIAGNOSIS — Z20828 Contact with and (suspected) exposure to other viral communicable diseases: Secondary | ICD-10-CM | POA: Diagnosis not present

## 2019-04-20 DIAGNOSIS — Z03818 Encounter for observation for suspected exposure to other biological agents ruled out: Secondary | ICD-10-CM | POA: Diagnosis not present

## 2019-04-21 ENCOUNTER — Other Ambulatory Visit: Payer: Self-pay | Admitting: Cardiology

## 2019-04-22 ENCOUNTER — Telehealth: Payer: Self-pay | Admitting: Acute Care

## 2019-04-22 NOTE — Telephone Encounter (Signed)
Patient has been rescheduled for 05/09/2019 @ 11:00am and he is aware of the appt

## 2019-04-22 NOTE — Telephone Encounter (Signed)
Phillip [name], can you call this pt to reschedule his f/u low dose ct?

## 2019-04-26 DIAGNOSIS — M9902 Segmental and somatic dysfunction of thoracic region: Secondary | ICD-10-CM | POA: Diagnosis not present

## 2019-04-26 DIAGNOSIS — M19011 Primary osteoarthritis, right shoulder: Secondary | ICD-10-CM | POA: Diagnosis not present

## 2019-04-26 DIAGNOSIS — M9901 Segmental and somatic dysfunction of cervical region: Secondary | ICD-10-CM | POA: Diagnosis not present

## 2019-04-26 DIAGNOSIS — M7541 Impingement syndrome of right shoulder: Secondary | ICD-10-CM | POA: Diagnosis not present

## 2019-04-27 ENCOUNTER — Ambulatory Visit: Payer: BC Managed Care – PPO

## 2019-05-02 ENCOUNTER — Other Ambulatory Visit: Payer: Self-pay | Admitting: Neurology

## 2019-05-02 ENCOUNTER — Other Ambulatory Visit: Payer: Self-pay | Admitting: Adult Health

## 2019-05-02 MED ORDER — AMPHETAMINE-DEXTROAMPHETAMINE 30 MG PO TABS: 30.0000 mg | ORAL_TABLET | Freq: Every day | ORAL | 0 refills | Status: DC

## 2019-05-02 NOTE — Telephone Encounter (Signed)
Pt is requesting a refill of amphetamine-dextroamphetamine (ADDERALL) 30 MG tablet , [id number] [place], [place] - [address] [place] [address] [place] [address]

## 2019-05-02 NOTE — Telephone Encounter (Signed)
Drug registry checked last fill Adderall 04-04-19 #60.

## 2019-05-03 ENCOUNTER — Ambulatory Visit (INDEPENDENT_AMBULATORY_CARE_PROVIDER_SITE_OTHER): Payer: BC Managed Care – PPO | Admitting: Psychology

## 2019-05-03 DIAGNOSIS — F064 Anxiety disorder due to known physiological condition: Secondary | ICD-10-CM

## 2019-05-05 DIAGNOSIS — M19011 Primary osteoarthritis, right shoulder: Secondary | ICD-10-CM | POA: Diagnosis not present

## 2019-05-05 DIAGNOSIS — S46111A Strain of muscle, fascia and tendon of long head of biceps, right arm, initial encounter: Secondary | ICD-10-CM | POA: Diagnosis not present

## 2019-05-05 DIAGNOSIS — M67813 Other specified disorders of tendon, right shoulder: Secondary | ICD-10-CM | POA: Diagnosis not present

## 2019-05-09 ENCOUNTER — Other Ambulatory Visit: Payer: Self-pay

## 2019-05-09 ENCOUNTER — Ambulatory Visit
Admission: RE | Admit: 2019-05-09 | Discharge: 2019-05-09 | Disposition: A | Discharge status: Home / Self Care | Payer: BC Managed Care – PPO | Source: Ambulatory Visit | Attending: Acute Care | Admitting: Acute Care

## 2019-05-09 DIAGNOSIS — Z87891 Personal history of nicotine dependence: Secondary | ICD-10-CM | POA: Diagnosis not present

## 2019-05-09 DIAGNOSIS — Z122 Encounter for screening for malignant neoplasm of respiratory organs: Secondary | ICD-10-CM

## 2019-05-10 ENCOUNTER — Ambulatory Visit (INDEPENDENT_AMBULATORY_CARE_PROVIDER_SITE_OTHER): Payer: BC Managed Care – PPO | Admitting: Psychology

## 2019-05-10 ENCOUNTER — Encounter: Payer: Self-pay | Admitting: Family Medicine

## 2019-05-10 DIAGNOSIS — F064 Anxiety disorder due to known physiological condition: Secondary | ICD-10-CM | POA: Diagnosis not present

## 2019-05-10 NOTE — Telephone Encounter (Signed)
  Dr. Raoul Pitch,              I have attached a request for lab work pertaining to Bartonella and other tick borne diseases.  Shortly after my hospitalization and delayed diagnosis of RMSF, I tested positive for Bartonella and Ehrlichia at a different medical facility.  I was given doxycycline for an additional two weeks.  Since the RMSF I have been to many specialists and been newly diagnosed and  treated for the following; Non-vascular  Reynaulds Disease   Secondary hypogonadism   Coronary artery disease requiring a stent w/ additional blockages   Worsening cognitive problems, joint pain, fatigue etc   At my low dose CT scan yesterday  (Monday, January 4th) emphysema was noted along with coronary artery calcification.           I realize a lot of specialists have carefully treated me in their respective fields.  I believe there are underlying problems which cause or at least contribute to these conditions.        The attached request allows me to at least file the tests with insurance. I will pay for them out of pocket if it is refused. I have thoroughly researched the different tests and chose this one after consulting with the lab of Dr. Benay Pillow of [facility].          Thank you for your consideration and care for me. Please contact me at [phone] or [email or web address] with any questions or comments.

## 2019-05-11 ENCOUNTER — Other Ambulatory Visit: Payer: Self-pay | Admitting: Neurology

## 2019-05-11 ENCOUNTER — Telehealth: Payer: Self-pay | Admitting: Adult Health

## 2019-05-11 NOTE — Telephone Encounter (Signed)
[facility] [place] [facility] stating that the never received the prescription for the pt's LORazepam (ATIVAN) 1 MG tablet Please resend.

## 2019-05-11 NOTE — Telephone Encounter (Signed)
I see that a prescription was done 03-30-19 states no print.  Drug registry checked states 01-17-19 #60. You saw 03-30-19. Do you want to redo this for pt?  I called pt and he is taking 1mg  po qhs at this time.  Still trying to reduce, (although mentioned having trouble selpping so will keep at 1mg  for a while).

## 2019-05-11 NOTE — Telephone Encounter (Signed)
Ive sent message to MM/NP regarding this pt.

## 2019-05-13 ENCOUNTER — Encounter: Payer: Self-pay | Admitting: Family Medicine

## 2019-05-16 ENCOUNTER — Other Ambulatory Visit: Payer: Self-pay

## 2019-05-16 DIAGNOSIS — M25511 Pain in right shoulder: Secondary | ICD-10-CM

## 2019-05-16 DIAGNOSIS — M67911 Unspecified disorder of synovium and tendon, right shoulder: Secondary | ICD-10-CM

## 2019-05-16 DIAGNOSIS — S46111A Strain of muscle, fascia and tendon of long head of biceps, right arm, initial encounter: Secondary | ICD-10-CM

## 2019-05-17 ENCOUNTER — Encounter: Payer: Self-pay | Admitting: Family Medicine

## 2019-05-17 ENCOUNTER — Ambulatory Visit: Payer: BC Managed Care – PPO | Admitting: Family Medicine

## 2019-05-17 ENCOUNTER — Other Ambulatory Visit: Payer: Self-pay

## 2019-05-17 DIAGNOSIS — Z0289 Encounter for other administrative examinations: Secondary | ICD-10-CM

## 2019-05-19 ENCOUNTER — Other Ambulatory Visit: Payer: Self-pay | Admitting: *Deleted

## 2019-05-19 DIAGNOSIS — Z87891 Personal history of nicotine dependence: Secondary | ICD-10-CM

## 2019-05-20 DIAGNOSIS — Z20828 Contact with and (suspected) exposure to other viral communicable diseases: Secondary | ICD-10-CM | POA: Diagnosis not present

## 2019-05-24 ENCOUNTER — Ambulatory Visit (INDEPENDENT_AMBULATORY_CARE_PROVIDER_SITE_OTHER): Payer: BC Managed Care – PPO | Admitting: Psychology

## 2019-05-24 DIAGNOSIS — F064 Anxiety disorder due to known physiological condition: Secondary | ICD-10-CM

## 2019-05-25 ENCOUNTER — Encounter: Payer: Self-pay | Admitting: Family Medicine

## 2019-05-25 ENCOUNTER — Ambulatory Visit (INDEPENDENT_AMBULATORY_CARE_PROVIDER_SITE_OTHER): Payer: BC Managed Care – PPO | Admitting: Family Medicine

## 2019-05-25 ENCOUNTER — Other Ambulatory Visit: Payer: Self-pay

## 2019-05-25 VITALS — BP 139/75 | Temp 97.8°F | Ht 73.0 in | Wt 189.0 lb

## 2019-05-25 DIAGNOSIS — A449 Bartonellosis, unspecified: Secondary | ICD-10-CM | POA: Diagnosis not present

## 2019-05-25 DIAGNOSIS — Z8619 Personal history of other infectious and parasitic diseases: Secondary | ICD-10-CM

## 2019-05-25 DIAGNOSIS — Z122 Encounter for screening for malignant neoplasm of respiratory organs: Secondary | ICD-10-CM | POA: Diagnosis not present

## 2019-05-25 DIAGNOSIS — J439 Emphysema, unspecified: Secondary | ICD-10-CM

## 2019-05-25 NOTE — Patient Instructions (Signed)
We are committed to keeping you informed about the COVID-19 vaccine.  As the vaccine continues to become available for each phase, we will ensure that patients who meet the criteria receive the information they need to access vaccination opportunities. Continue to check your MyChart account and [email or web address] for updates. Please review the Phase 1b information below.  Following [place]'s guidelines for the distribution of COVID-19 vaccines we are pleased to share our plans to begin offering vaccines to those 65 and older (Phase 1b). Here are details of those plans:  [facility] - [place] On Tuesday, Jan. 19, the [place] [facility] ([facility]) and [place] begin large-scale COVID-19 vaccinations at the [facility]. The vaccinations are appointment only and for those 65 and older.  Walk-ins will not be accepted.  All appointments are currently filled. Please join our waiting list for the next available appointments. We will contact you when appointments become available. Please do not sign up more than once.  Join Our Waiting List   Other Vaccination Opportunities in Our Area We are also working in partnership with county health agencies in our service counties to ensure continuing vaccination availability in the weeks and months ahead. Learn more about each county's vaccination efforts in the website links below:   [place]  [place]  [place]  [place]  [place]   [place]'s phase 1b vaccination guidelines, prioritizing those 65 and over as the next eligible group to receive the COVID-19 vaccine, are detailed at YourSpotYourShot.[place].gov.   Vaccine Safety and Effectiveness Clinical trials for the Pfizer COVID-19 vaccine involved 42,000 people and showed that the vaccine is more than 95% effective in preventing COVID-19 with no serious safety concerns. Similar results have  been reported for the Moderna COVID-19 vaccine. Side effects reported in the Pfizer clinical trials include a sore arm at the injection site, fatigue, headache, chills and fever. While side effects from the Pfizer COVID-19 vaccine are higher than for a typical flu vaccine, they are lower in many ways than side effects from the leading vaccine to prevent shingles. Side effects are signs that a vaccine is working and are related to your immune system being stimulated to produce antibodies against infection. Side effects from vaccination are far less significant than health impacts from COVID-19.  Staying Informed Pharmacists, infectious disease doctors, critical care nurses and other experts at [facility] continue to speak publicly through media interviews and direct communication with our patients and communities about the safety, effectiveness and importance of vaccines to eliminate COVID-19. In addition, reliable information on vaccine safety, effectiveness, side effects and more is available on the following websites:  [place] Department of Health and Human Services COVID-19 Vaccine Information Website.  U.S. Centers for Disease Control and Prevention COVID-19 Vaccine Public Information Website.  Staying Safe We agree with the CDC on what we can do to help our communities get back to normal: Getting "back to normal" is going to take all of our tools. If we use all the tools we have, we stand the best chance of getting our families, communities, schools and workplaces "back to normal" sooner:  Get vaccinated as soon as vaccines become available within the phase of the state's vaccination rollout plan for which you meet the eligibility criteria.  Wear a mask.  Stay 6 feet from others and avoid crowds.  Wash hands often.  For our most current information, please visit [email or web address].  

## 2019-05-25 NOTE — Progress Notes (Signed)
VIRTUAL VISIT VIA VIDEO  I connected with Phillip [name] on 05/27/19 at  1:30 PM EST by a video enabled telemedicine application and verified that I am speaking with the correct person using two identifiers. Location patient: Home Location provider: [facility] Office Persons participating in the virtual visit: Patient, Dr. Raoul Pitch and R.Baker, LPN  I discussed the limitations of evaluation and management by telemedicine and the availability of in person appointments. The patient expressed understanding and agreed to proceed.   SUBJECTIVE Chief Complaint  Patient presents with  . Follow-up    Pt would like to discuss recent lab results and lung scan     HPI: Phillip [name] is a 64 y.o. male presents today with questions surrounding his CT chest lung screening.    Reviewed CT with patient that was ordered by pulmonology for lung cancer screening.  He has some concerns over the new notation of mild emphysema.  He had been a smoker for many years.  He is asymptomatic.  Patient has questions and concerns surrounding his health over the last few years.  He states since he had been hospitalized in 2017 with Bartonella/Ehrlichia and [facility] spotted fever he feels he is having multiple autoimmune/endocrine disorders that have started occurring.  For example he states Raynaud's, his CAD, hypogonadotrophic hypogonadism and now the lung changes seen with possible emphysema.  He states today that he never did follow-up outpatient with infectious disease.  He has questions today surrounding testing.  ROS: See pertinent positives and negatives per HPI.  Patient Active Problem List   Diagnosis Date Noted  . Mild emphysema ([place]) 05/27/2019  . Essential hypertension 08/11/2018  . Statin intolerance 08/11/2018  . Coronary artery disease involving native coronary artery of native heart without angina pectoris 07/18/2018  . CAD S/P percutaneous coronary angioplasty 06/30/2018  . Abnormal  computed tomography angiography (CTA)   . Hyperlipidemia with target LDL less than 70 05/22/2018  . 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 Chronic prescription benzodiazepine use 09/14/2017  . Chronic insomnia 09/14/2017  . Accidental testosterone overdose 03/16/2017  . Erectile dysfunction 12/09/2016  . 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 Encephalopathy, traumatic 10/08/2015  . Raynaud's disease 10/08/2015  . 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 Anxiety   . Cognitive impairment 01/31/2013  . 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 ADD (attention deficit disorder) 03/10/2012    Social History   Tobacco Use  . Smoking status: Former Smoker    Packs/day: 1.50    Years: 25.00    Pack years: 37.50    Types: Cigarettes    Quit date: 11/22/2014    Years since quitting: 4.5  . Smokeless tobacco: Never Used  . Tobacco comment: Encouraged to remain smoke free  Substance Use Topics  . Alcohol use: No    Alcohol/week: 0.0 standard drinks    Current Outpatient Medications:  .  amLODipine (NORVASC) 5 MG tablet, TAKE 1 TABLET BY MOUTH EVERY DAY, Disp: 90 tablet, Rfl: 2 .  amphetamine-dextroamphetamine (ADDERALL) 30 MG tablet, Take 1-2 tablets by mouth daily., Disp: 60 tablet, Rfl: 0 .  Ascorbic Acid (VITAMIN C PO), Take 1 tablet by mouth 2 (two) times daily., Disp: , Rfl:  .  B Complex-C (B-COMPLEX WITH VITAMIN C) tablet, Take 1 tablet by mouth daily., Disp: , Rfl:  .  clopidogrel (PLAVIX) 75 MG tablet, TAKE 1 TABLET BY MOUTH EVERY DAY WITH BREAKFAST, Disp: 90 tablet, Rfl: 2 .  Coenzyme Q10 (COQ10) 100 MG CAPS, Take 100 mg  by mouth 2 (two) times daily., Disp: , Rfl:  .  Evolocumab (REPATHA SURECLICK) XX123456 MG/ML SOAJ, Inject 140 mg into the skin every 14 (fourteen) days., Disp: 2 pen, Rfl: 11 .  LORazepam (ATIVAN) 1 MG tablet, Take 1 tablet (1 mg total) by mouth at bedtime. (Patient taking differently: Take 1 mg by mouth at bedtime. 0.5 tablets at night), Disp: 30 tablet, Rfl: 5 .   meloxicam (MOBIC) 15 MG tablet, Take 0.5-1 tablets (7.5-15 mg total) by mouth daily as needed for pain., Disp: 30 tablet, Rfl: 6 .  Misc Natural Products (PROSTATE SUPPORT PO), Take 1 capsule by mouth every evening. Prostate Plus, Disp: , Rfl:  .  nitroGLYCERIN (NITROSTAT) 0.4 MG SL tablet, Place 1 tablet (0.4 mg total) under the tongue every 5 (five) minutes as needed for chest pain., Disp: 25 tablet, Rfl: 3 .  NON FORMULARY, Take 1 capsule by mouth daily. MK-7 Supplements, Disp: , Rfl:  .  Omega-3 Fatty Acids (SUPER OMEGA 3 EPA/DHA PO), Take 1 capsule by mouth daily., Disp: , Rfl:  .  OVER THE COUNTER MEDICATION, Take 1 capsule by mouth daily. Nitric oxide supplements, Disp: , Rfl:  .  testosterone cypionate (DEPOTESTOSTERONE CYPIONATE) 200 MG/ML injection, Inject 0.3 ML once a week, Disp: 10 mL, Rfl: 2 .  vitamin E 400 UNIT capsule, Take 400 Units by mouth daily., Disp: , Rfl:   Allergies  Allergen Reactions  . Penicillins Hives    Did it involve swelling of the face/tongue/throat, SOB, or low BP? Unknown Did it involve sudden or severe rash/hives, skin peeling, or any reaction on the inside of your mouth or nose? Yes Did you need to seek medical attention at a hospital or doctor's office? Unknown When did it last happen?Occurred at age 89 or 40 If all above answers are "NO", may proceed with cephalosporin use.   . Statins Other (See Comments)    confusion  . Methylprednisolone Other (See Comments)    Unsure of exact reaction type  . Amphetamines Other (See Comments)    Intolerant to specific formulations: [employer] amphetamine TEVA dextroamp amphetamine  . Lorazepam Other (See Comments)    Mylan lorazepam; others ok  . Propofol Other (See Comments)    Cognitive delay and emotional    OBJECTIVE: BP 139/75   Temp 97.8 F (36.6 C) (Temporal)   Ht 6\' 1"  (1.854 m)   Wt 189 lb (85.7 kg)   BMI 24.94 kg/m  Gen: No acute distress. Nontoxic in appearance.  HENT: AT. [place].  MMM.    Eyes:Pupils Equal Round Reactive to light, Extraocular movements intact,  Conjunctiva without redness, discharge or icterus. Chest: Cough or shortness of breath not present Skin: No rashes, purpura or petechiae.  Neuro:  Normal gait. Alert. Oriented x3  Psych: Normal affect, dress and demeanor. Normal speech. Normal thought content and judgment.  ASSESSMENT AND PLAN: Phillip [name] is a 64 y.o. male present for  Encounter for screening for lung cancer/Mild emphysema ([place]) Patient is asymptomatic.  He has had some changes over the years in his CT results from groundglass appearance to mild emphysema this year.  He would like to go ahead and pursue a pulmonary referral today for further evaluation. - Ambulatory referral to Pulmonology  History of [facility] spotted fever/Bartonella infection/Hx of ehrlichiosis Lengthy discussion today with him and his caretaker.  He has history of infections in 2017 were rather extensive, prior to his PCP.  He also has a history of meningeal encephalitis  in 1994.   He did not follow-up after hospitalization in 2017 with infectious disease.  He has many questions surrounding his infections and if they could be playing a role in his current condition such as Raynaud's, hypogonadism, three-vessel CAD etc.   He is agreeable to discuss with infectious disease.  We discussed referral today and he would like to see a different ID doctor thant he saw in the hospital.   Referral Orders     Ambulatory referral to Pulmonology     Ambulatory referral to Infectious Disease   Howard Pouch, DO 05/27/2019

## 2019-05-27 ENCOUNTER — Encounter: Payer: Self-pay | Admitting: Family Medicine

## 2019-05-27 DIAGNOSIS — J439 Emphysema, unspecified: Secondary | ICD-10-CM | POA: Insufficient documentation

## 2019-06-02 ENCOUNTER — Other Ambulatory Visit: Payer: Self-pay | Admitting: Adult Health

## 2019-06-02 DIAGNOSIS — M9902 Segmental and somatic dysfunction of thoracic region: Secondary | ICD-10-CM | POA: Diagnosis not present

## 2019-06-02 DIAGNOSIS — M19011 Primary osteoarthritis, right shoulder: Secondary | ICD-10-CM | POA: Diagnosis not present

## 2019-06-02 DIAGNOSIS — M7541 Impingement syndrome of right shoulder: Secondary | ICD-10-CM | POA: Diagnosis not present

## 2019-06-02 DIAGNOSIS — M9901 Segmental and somatic dysfunction of cervical region: Secondary | ICD-10-CM | POA: Diagnosis not present

## 2019-06-02 MED ORDER — AMPHETAMINE-DEXTROAMPHETAMINE 30 MG PO TABS: 30.0000 mg | ORAL_TABLET | Freq: Every day | ORAL | 0 refills | Status: DC

## 2019-06-02 NOTE — Addendum Note (Signed)
Addended by: Brandon Melnick on: 06/02/2019 02:31 PM   Modules accepted: Orders

## 2019-06-02 NOTE — Telephone Encounter (Signed)
Drug registry checked last fill 05-04-19 #60.

## 2019-06-02 NOTE — Telephone Encounter (Signed)
Ramey,Phillip [name] has called for a refill on pt's amphetamine-dextroamphetamine (ADDERALL) 30 MG tablet [facility] #[id number] - [place], [place] - [address] [place]

## 2019-06-07 ENCOUNTER — Ambulatory Visit (INDEPENDENT_AMBULATORY_CARE_PROVIDER_SITE_OTHER): Payer: BC Managed Care – PPO | Admitting: Psychology

## 2019-06-07 DIAGNOSIS — F064 Anxiety disorder due to known physiological condition: Secondary | ICD-10-CM

## 2019-06-08 ENCOUNTER — Emergency Department (HOSPITAL_COMMUNITY): Payer: BC Managed Care – PPO

## 2019-06-08 ENCOUNTER — Telehealth: Payer: Self-pay | Admitting: Cardiology

## 2019-06-08 ENCOUNTER — Other Ambulatory Visit: Payer: Self-pay

## 2019-06-08 ENCOUNTER — Encounter (HOSPITAL_COMMUNITY): Payer: Self-pay | Admitting: Emergency Medicine

## 2019-06-08 ENCOUNTER — Emergency Department (HOSPITAL_COMMUNITY)
Admission: EM | Admit: 2019-06-08 | Discharge: 2019-06-09 | Disposition: A | Discharge status: Home / Self Care | Payer: BC Managed Care – PPO | Attending: Emergency Medicine | Admitting: Emergency Medicine

## 2019-06-08 ENCOUNTER — Encounter: Payer: Self-pay | Admitting: Family Medicine

## 2019-06-08 DIAGNOSIS — I2510 Atherosclerotic heart disease of native coronary artery without angina pectoris: Secondary | ICD-10-CM | POA: Insufficient documentation

## 2019-06-08 DIAGNOSIS — F909 Attention-deficit hyperactivity disorder, unspecified type: Secondary | ICD-10-CM | POA: Diagnosis not present

## 2019-06-08 DIAGNOSIS — R0789 Other chest pain: Secondary | ICD-10-CM | POA: Diagnosis not present

## 2019-06-08 DIAGNOSIS — Z79899 Other long term (current) drug therapy: Secondary | ICD-10-CM | POA: Diagnosis not present

## 2019-06-08 DIAGNOSIS — R079 Chest pain, unspecified: Secondary | ICD-10-CM | POA: Diagnosis not present

## 2019-06-08 DIAGNOSIS — Z87891 Personal history of nicotine dependence: Secondary | ICD-10-CM | POA: Insufficient documentation

## 2019-06-08 DIAGNOSIS — I10 Essential (primary) hypertension: Secondary | ICD-10-CM | POA: Diagnosis not present

## 2019-06-08 DIAGNOSIS — R0602 Shortness of breath: Secondary | ICD-10-CM | POA: Diagnosis not present

## 2019-06-08 LAB — CBC
HCT: 43.4 % (ref 39.0–52.0)
Hemoglobin: 14.5 g/dL (ref 13.0–17.0)
MCH: 31.8 pg (ref 26.0–34.0)
MCHC: 33.4 g/dL (ref 30.0–36.0)
MCV: 95.2 fL (ref 80.0–100.0)
Platelets: 315 10*3/uL (ref 150–400)
RBC: 4.56 MIL/uL (ref 4.22–5.81)
RDW: 12.4 % (ref 11.5–15.5)
WBC: 5.8 10*3/uL (ref 4.0–10.5)
nRBC: 0 % (ref 0.0–0.2)

## 2019-06-08 LAB — BASIC METABOLIC PANEL
Anion gap: 9 (ref 5–15)
BUN: 20 mg/dL (ref 8–23)
CO2: 26 mmol/L (ref 22–32)
Calcium: 9.6 mg/dL (ref 8.9–10.3)
Chloride: 102 mmol/L (ref 98–111)
Creatinine, Ser: 0.98 mg/dL (ref 0.61–1.24)
GFR calc Af Amer: >60 mL/min (ref >60)
GFR calc non Af Amer: >60 mL/min (ref >60)
Glucose, Bld: 108 mg/dL — ABNORMAL HIGH (ref 70–99)
Potassium: 4.1 mmol/L (ref 3.5–5.1)
Sodium: 137 mmol/L (ref 135–145)

## 2019-06-08 LAB — D-DIMER, QUANTITATIVE: D-Dimer, Quant: <0.27 ug/mL-FEU (ref 0.00–0.50)

## 2019-06-08 LAB — TROPONIN I (HIGH SENSITIVITY)
Troponin I (High Sensitivity): 4 ng/L (ref <18)
Troponin I (High Sensitivity): 4 ng/L (ref <18)

## 2019-06-08 MED ORDER — SODIUM CHLORIDE 0.9% FLUSH
3.0000 mL | Freq: Once | INTRAVENOUS | Status: AC
  Administered 2019-06-08: 3 mL via INTRAVENOUS

## 2019-06-08 NOTE — Telephone Encounter (Signed)
I am not really sure what to make of the symptoms.  I do think that is not unreasonable to try to be seen in the ER if he gets worse.  If not, may be can have him seen by APP or maybe get him on my schedule for next week.  [name], MD

## 2019-06-08 NOTE — ED Notes (Signed)
Patient verbalizes understanding of discharge instructions. Opportunity for questioning and answers were provided. Armband removed by staff, pt discharged from ED. Pt. ambulatory and discharged home.  

## 2019-06-08 NOTE — Telephone Encounter (Signed)
New Message    Pt c/o Shortness Of Breath: STAT if SOB developed within the last 24 hours or pt is noticeably SOB on the phone  1. Are you currently SOB (can you hear that pt is SOB on the phone)? Yes  2. How long have you been experiencing SOB? This morning  3. Are you SOB when sitting or when up moving around? Moving around  4. Are you currently experiencing any other symptoms? Breathing in deep it hurts rib change

## 2019-06-08 NOTE — ED Provider Notes (Signed)
[facility] EMERGENCY DEPARTMENT Provider Note   CSN: [id number] Arrival date & time: 06/08/19  1759     History Chief Complaint  Patient presents with  . Chest Pain  . Shortness of Breath    Phillip [name] is a 64 y.o. male.  64yo M w/ PMH including CAD s/p stenting, raynauds, TBI who p/w chest pain. This morning, he began having sharp, intermittent L sided chest pain which is worse with deep breathing and worse when he turns his body a certain way. He feels like he is shallow breathing because it hurts to breathe. He reports associated SOB and sweats, no N/V. No cough/cold symptoms, fevers, or recent illness.    Recent car ride ~ 3 hours. No leg swelling/pain, cancer, or h/o blood clots.   The history is provided by the patient.       Past Medical History:  Diagnosis Date  . ADD (attention deficit disorder)    on adderrall managed by neurology  . Anxiety    on ativan managed by Neurology  . 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 CAD S/P percutaneous coronary angioplasty 06/2017   pLAD1 55% (not significant), p-mLAD2 80% & mLAD 70% (tandem lesions - DES PCI . Synergy DES 2.75 x 38 -- 3.0 mm).  mCx ~60% - med Rx, mRCA 30% & 40%.  ER 55-60%.  . Cellulitis   . Chronic traumatic encephalopathy    right frontal-temproal lobe  . Complication of anesthesia    " BRAIN FOG "  . Coronary artery calcification seen on CAT scan 05/12/2018   Triple- by CT - lung ca screen 04/2018  Cardio 06/2018: Coronary calcium score results shows aortic atherosclerosis (with mild dilation) as well as left main and three-vessel coronary artery calcification. The calcium score is 1168 which is very high risk amount of calcium.  Based on these findings, I would recommend proceeding with a coronary CT angiogram (a more detailed study with Intravenous X  . Depression   . Erectile dysfunction 11/2016   follows with urology; sildenafil prescribed  . Meningoencephalitis 1994   [place]  . Pneumonia   . Primary hypogonadism in male   . Raynaud's disease [id number]   As complication of RMSF  . Restless legs 09/13/2015  . [place] spotted fever 2017  . TBI (traumatic brain injury) ([place]) 1997, 2009   "concussions"    Patient Active Problem List   Diagnosis Date Noted  . Mild emphysema ([place]) 05/27/2019  . Essential hypertension 08/11/2018  . Statin intolerance 08/11/2018  . Coronary artery disease involving native coronary artery of native heart without angina pectoris 07/18/2018  . CAD S/P percutaneous coronary angioplasty 06/30/2018  . Abnormal computed tomography angiography (CTA)   . Hyperlipidemia with target LDL less than 70 05/22/2018  . 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 Chronic prescription benzodiazepine use 09/14/2017  . Chronic insomnia 09/14/2017  . Accidental testosterone overdose 03/16/2017  . Erectile dysfunction 12/09/2016  . 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 Encephalopathy, traumatic 10/08/2015  . Raynaud's disease 10/08/2015  . 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 Anxiety   . Cognitive impairment 01/31/2013  . 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 ADD (attention deficit disorder) 03/10/2012    Past Surgical History:  Procedure Laterality Date  . CORONARY STENT INTERVENTION N/A 06/30/2018   Procedure: CORONARY STENT INTERVENTION;  Surgeon: [name], MD;  Location: [facility];;  p-mLAD2 80% & mLAD 70% (tandem lesions) - DES PCI Synergy DES 2.75 x 38 (3.0 mm).  . CT CTA CORONARY W/CA SCORE W/CM &/OR WO/CM  06/2018   Cardiac cath score read 1318.  Moderate coronary disease sent for FFR: mRCA 0.84 (non-significant), mLAD 0.64 (signifiant), pLOM(Cx) - 0,72 (significant - but distal lesion)  . LEFT HEART CATH AND CORONARY ANGIOGRAPHY N/A 06/30/2018   Procedure: LEFT HEART CATH AND CORONARY ANGIOGRAPHY;  Surgeon: [name], MD;   Location: [facility];;  pLAD1 55% (not significant), p-mLAD2 80% & mLAD 70% (tandem lesions -> DES PCI). mCx ~60% (med Rx), mRCA 30% & 40%.  ER 55-60%.   . TEE WITHOUT CARDIOVERSION N/A 09/06/2015   Procedure: TRANSESOPHAGEAL ECHOCARDIOGRAM (TEE);  Surgeon: Jerline Pain, MD;  R/O endocarditis;  . TONSILLECTOMY  1965       Family History  Problem Relation Age of Onset  . Hyperlipidemia Father   . Heart disease Maternal Grandfather 75  . Cancer Paternal Grandfather   . Mental illness Mother   . Mental illness Brother     Social History   Tobacco Use  . Smoking status: Former Smoker    Packs/day: 1.50    Years: 25.00    Pack years: 37.50    Types: Cigarettes    Quit date: 11/22/2014    Years since quitting: 4.5  . Smokeless tobacco: Never Used  . Tobacco comment: Encouraged to remain smoke free  Substance Use Topics  . Alcohol use: No    Alcohol/week: 0.0 standard drinks  . Drug use: No    Home Medications Prior to Admission medications   Medication Sig Start Date End Date Taking? Authorizing Provider  amLODipine (NORVASC) 5 MG tablet TAKE 1 TABLET BY MOUTH EVERY DAY 04/13/19   [name], MD  amphetamine-dextroamphetamine (ADDERALL) 30 MG tablet Take 1-2 tablets by mouth daily. 06/02/19   [name], NP  Ascorbic Acid (VITAMIN C PO) Take 1 tablet by mouth 2 (two) times daily.    [provider]  B Complex-C (B-COMPLEX WITH VITAMIN C) tablet Take 1 tablet by mouth daily.    [provider]  clopidogrel (PLAVIX) 75 MG tablet TAKE 1 TABLET BY MOUTH EVERY DAY WITH BREAKFAST 04/21/19   Reino Bellis B, NP  Coenzyme Q10 (COQ10) 100 MG CAPS Take 100 mg by mouth 2 (two) times daily.    [provider]  Evolocumab (REPATHA SURECLICK) [id number] MG/ML SOAJ Inject 140 mg into the skin every 14 (fourteen) days. 08/18/18   Kuneff, Renee A, DO  LORazepam (ATIVAN) 1 MG tablet Take 1 tablet (1 mg total) by mouth at bedtime. Patient taking differently:  Take 1 mg by mouth at bedtime. 0.5 tablets at night 05/12/19   [name], NP  meloxicam (MOBIC) 15 MG tablet Take 0.5-1 tablets (7.5-15 mg total) by mouth daily as needed for pain. 04/12/19   Hilts, Legrand Como, MD  Misc Natural Products (PROSTATE SUPPORT PO) Take 1 capsule by mouth every evening. Prostate Plus    [provider]  nitroGLYCERIN (NITROSTAT) 0.4 MG SL tablet Place 1 tablet (0.4 mg total) under the tongue every 5 (five) minutes as needed for chest pain. 07/01/18 07/01/19  Cheryln Manly, NP  NON FORMULARY Take 1 capsule by mouth daily. MK-7 Supplements    [provider]  Omega-3 Fatty Acids (SUPER OMEGA 3 EPA/DHA PO) Take 1 capsule by mouth daily.    [provider]  OVER THE COUNTER MEDICATION Take 1  capsule by mouth daily. Nitric oxide supplements    [provider]  testosterone cypionate (DEPOTESTOSTERONE CYPIONATE) 200 MG/ML injection Inject 0.3 ML once a week 08/27/18   [name], MD  vitamin E 400 UNIT capsule Take 400 Units by mouth daily.    [provider]    Allergies    Penicillins, Statins, Methylprednisolone, Amphetamines, Lorazepam, and Propofol  Review of Systems   Review of Systems All other systems reviewed and are negative except that which was mentioned in HPI  Physical Exam Updated Vital Signs BP (!) 142/94   Pulse 64   Temp 97.8 F (36.6 C) (Oral)   Resp 20   Ht 6\' 1"  (1.854 m)   Wt 84.8 kg   SpO2 98%   BMI 24.67 kg/m   Physical Exam Vitals and nursing note reviewed.  Constitutional:      General: He is not in acute distress.    Appearance: He is well-developed.  HENT:     Head: Normocephalic and atraumatic.  Eyes:     Conjunctiva/sclera: Conjunctivae normal.  Cardiovascular:     Rate and Rhythm: Normal rate and regular rhythm.     Heart sounds: Normal heart sounds. No murmur.  Pulmonary:     Effort: Pulmonary effort is normal.     Breath sounds: Normal breath sounds.  Chest:      Chest wall: Tenderness (L anterior at lower breast) present. No crepitus.  Abdominal:     General: Bowel sounds are normal. There is no distension.     Palpations: Abdomen is soft.     Tenderness: There is no abdominal tenderness.  Musculoskeletal:     Cervical back: Neck supple.     Right lower leg: No edema.     Left lower leg: No edema.  Skin:    General: Skin is warm and dry.  Neurological:     Mental Status: He is alert and oriented to person, place, and time.     Comments: Fluent speech  Psychiatric:        Judgment: Judgment normal.     ED Results / Procedures / Treatments   Labs (all labs ordered are listed, but only abnormal results are displayed) Labs Reviewed  BASIC METABOLIC PANEL - Abnormal; Notable for the following components:      Result Value   Glucose, Bld 108 (*)    All other components within normal limits  CBC  D-DIMER, QUANTITATIVE (NOT AT [facility])  TROPONIN I (HIGH SENSITIVITY)  TROPONIN I (HIGH SENSITIVITY)    EKG EKG Interpretation  Date/Time:  Wednesday June 08 2019 18:17:51 EST Ventricular Rate:  71 PR Interval:  162 QRS Duration: 94 QT Interval:  368 QTC Calculation: 399 R Axis:   28 Text Interpretation: Normal sinus rhythm Septal infarct , age undetermined Abnormal ECG No significant change since last tracing Confirmed by Theotis Burrow [phone]) on 06/08/2019 8:54:57 PM   Radiology DG Chest 2 View  Result Date: 06/08/2019 CLINICAL DATA:  Chest pain and shortness of breath. EXAM: CHEST - 2 VIEW COMPARISON:  July 01, 2018 FINDINGS: The heart size and mediastinal contours are within normal limits. Both lungs are clear. The visualized skeletal structures are unremarkable. IMPRESSION: No active cardiopulmonary disease. Electronically Signed   By: Virgina Norfolk M.D.   On: 06/08/2019 19:01    Procedures Procedures (including critical care time)  Medications Ordered in ED Medications  sodium chloride flush (NS) 0.9 % injection 3 mL (3  mLs Intravenous Given 06/08/19 2119)  ED Course  I have reviewed the triage vital signs and the nursing notes.  Pertinent labs & imaging results that were available during my care of the patient were reviewed by me and considered in my medical decision making (see chart for details).    MDM Rules/Calculators/A&P                      Well-appearing on exam, he was tender over the left chest which reproduced his symptoms and he does state that turning to the left seems to exacerbate his pain.  Because of the pleuritic nature of his pain, obtained a D-dimer.  EKG is without acute ischemic changes.  Chest x-ray normal.  Serial troponins negative.  Because of his extensive cardiac history, discussed with cardiology on call, Dr. Paticia Stack. He agreed w/ me that symptoms are very atypical for ACS as patient's pain is reproducible with palpation and with certain movements and given that his serial troponins are negative, he would very likely not require an emergent/urgent catheterization.  He advised continuing work-up for noncardiac etiologies of his pain.  I am signing out to the oncoming provider pending D-dimer.  If patient is negative for PE, I anticipate discharge with close cardiology follow-up. Final Clinical Impression(s) / ED Diagnoses Final diagnoses:  Atypical chest pain    Rx / DC Orders ED Discharge Orders    None       Shannyn Jankowiak, Wenda Overland, MD 06/08/19 2348

## 2019-06-08 NOTE — ED Triage Notes (Signed)
Pt arrives to ED from home with complaints of left sided tight chest pain and shortness of breath starting this morning. Patient states the pain has not gone away and he gets more SOB when he walks.

## 2019-06-08 NOTE — Telephone Encounter (Signed)
Patient called in stating that he has been SOB since this morning- and having some left side pain, when he deep breaths, and when turning to the left. He states he does notice the pain as well when he turns to the right, but he feels it is heart related and not muscular related. He does have some tightness, but not really painful. He has no increased swelling in his hands, his BP is 135/78 and HR is 78. Patient is audible SOB on the phone. I did advise patient I would send a message to [place] to see of any recommendations, but I did mention to him not to wait for the call back to go ahead and go to the ER if he feels he is not getting better or getting worse.  Patient verbalized and states he may just go ahead and go to get monitored.

## 2019-06-10 DIAGNOSIS — Z20828 Contact with and (suspected) exposure to other viral communicable diseases: Secondary | ICD-10-CM | POA: Diagnosis not present

## 2019-06-10 NOTE — Telephone Encounter (Signed)
Patient went to ED.  Upcoming visit with Dr.Harding 02/10

## 2019-06-13 ENCOUNTER — Ambulatory Visit (INDEPENDENT_AMBULATORY_CARE_PROVIDER_SITE_OTHER): Payer: BC Managed Care – PPO | Admitting: Psychology

## 2019-06-13 DIAGNOSIS — F064 Anxiety disorder due to known physiological condition: Secondary | ICD-10-CM | POA: Diagnosis not present

## 2019-06-15 ENCOUNTER — Encounter: Payer: Self-pay | Admitting: Cardiology

## 2019-06-15 ENCOUNTER — Ambulatory Visit: Payer: BC Managed Care – PPO | Admitting: Cardiology

## 2019-06-15 ENCOUNTER — Other Ambulatory Visit: Payer: Self-pay

## 2019-06-15 VITALS — BP 150/94 | HR 67 | Ht 73.0 in | Wt 185.4 lb

## 2019-06-15 DIAGNOSIS — Z9861 Coronary angioplasty status: Secondary | ICD-10-CM

## 2019-06-15 DIAGNOSIS — I10 Essential (primary) hypertension: Secondary | ICD-10-CM

## 2019-06-15 DIAGNOSIS — I2510 Atherosclerotic heart disease of native coronary artery without angina pectoris: Secondary | ICD-10-CM | POA: Diagnosis not present

## 2019-06-15 DIAGNOSIS — M25511 Pain in right shoulder: Secondary | ICD-10-CM | POA: Diagnosis not present

## 2019-06-15 DIAGNOSIS — E785 Hyperlipidemia, unspecified: Secondary | ICD-10-CM | POA: Diagnosis not present

## 2019-06-15 NOTE — Patient Instructions (Signed)
Medication Instructions:  No changes  Will have the [facility]-pharmacist contact you about [place] *If you need a refill on your cardiac medications before your next appointment, please call your pharmacy*  Lab Work: Lipid Liver panel  If you have labs (blood work) drawn today and your tests are completely normal, you will receive your results only by: Marland Kitchen MyChart Message (if you have MyChart) OR . A paper copy in the mail If you have any lab test that is abnormal or we need to change your treatment, we will call you to review the results.  Testing/Procedures:  not needed  Follow-Up: At [facility], you and your health needs are our priority.  As part of our continuing mission to provide you with exceptional heart care, we have created designated Provider Care Teams.  These Care Teams include your primary Cardiologist (physician) and Advanced Practice Providers (APPs -  Physician Assistants and Nurse Practitioners) who all work together to provide you with the care you need, when you need it.  Your next appointment:   6 month(s)  The format for your next appointment:   In Person  Provider:   [name], MD   Other Instructions If needed you may hold plavix if needed for procedure , please have the doctor's office to contact us for a official  Notation.

## 2019-06-15 NOTE — Progress Notes (Signed)
Primary Care Provider: [name], DO Cardiologist: [name], MD Electrophysiologist: None  Clinic Note: Chief Complaint  Patient presents with  . Hospitalization Follow-up    ER visit for chest pain  . Coronary Artery Disease    Due for 6-62-month follow-up    HPI:    Phillip [name] is a 64 y.o. male with a PMH notable for CAD, hypertension hyperlipidemia who presents today for ER visit but also for routine 6-91-month follow-up which is essentially 1 year out from his PCI.   CAD: (Feb 2020 -elevated CORONARY CALCIUM SCORE --> CORONARY CTA with LA lesion --> Cath & LAD DES PCI)  Phillip [name] was last seen on October 27, 2018 via telemedicine  Recent Hospitalizations:   ER visit 06/08/2019: Phillip [name] intermittent left-sided chest pain worse with deep inspiration and certain movements.  Recent car drive. Ruled out for MI & PE - thought to be related to MSK pain.  CT scan of the chest suggested emphysema.  Reviewed  CV studies:    The following studies were reviewed today: (if available, images/films reviewed: From Epic Chart or Care Everywhere) . none:   Interval History:   Phillip [name] is here today along with one of his friends to help with him memory issues.  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  Helge says he has not had any further episodes of chest pain since he went to emergency room.  That was a sharp stabbing type pain which was very different to what he felt when we did his angioplasty and stent placement back in February 2020.  He has not had any further chest pain or pressure since then.  Has not had any heart failure symptoms or arrhythmia symptoms.  No headaches or blurred vision.   CV Review of Symptoms (Summary): no chest pain or dyspnea on exertion positive for - Occasional skipped beats but nothing significant.  Also the chest discomfort noted during ER visit. negative for - edema, orthopnea, paroxysmal nocturnal  dyspnea, rapid heart rate, shortness of breath or Syncope/near syncope, TIA/amaurosis fugax, claudication  The patient does not have symptoms concerning for COVID-19 infection (fever, chills, cough, or new shortness of breath).  The patient is practicing social distancing & Masking.    REVIEWED OF SYSTEMS   A comprehensive ROS was performed. ROS   I have reviewed and (if needed) personally updated the patient's problem list, medications, allergies, past medical and surgical history, social and family history.   PAST MEDICAL HISTORY   Past Medical History:  Diagnosis Date  . ADD (attention deficit disorder)    on adderrall managed by neurology  . Anxiety    on ativan managed by Neurology  . 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 CAD S/P percutaneous coronary angioplasty 06/2017   pLAD1 55% (not significant), p-mLAD2 80% & mLAD 70% (tandem lesions - DES PCI . Synergy DES 2.75 x 38 -- 3.0 mm).  mCx ~60% - med Rx, mRCA 30% & 40%.  ER 55-60%.  . Cellulitis   . Chronic traumatic encephalopathy    right frontal-temproal lobe  . Complication of anesthesia    " BRAIN FOG "  . Coronary artery calcification seen on CAT scan 05/12/2018   Triple- by CT - lung ca screen 04/2018  Cardio 06/2018: Coronary calcium score results shows aortic atherosclerosis (with mild  dilation) as well as left main and three-vessel coronary artery calcification. The calcium score is 1168 which is very high risk amount of calcium.  Based on these findings, I would recommend proceeding with a coronary CT angiogram (a more detailed study with Intravenous X  . Depression   . Erectile dysfunction 11/2016   follows with urology; sildenafil prescribed  . Meningoencephalitis 1994   [place]  . Pneumonia   . Primary hypogonadism in male   . Raynaud's disease 123XX123   As complication of RMSF  . Restless legs 09/13/2015  . [place] fever 2017  . TBI (traumatic brain injury) ([place]) 1997, 2009   "concussions"    PAST SURGICAL HISTORY   Past Surgical History:  Procedure Laterality Date  . CORONARY STENT INTERVENTION N/A 06/30/2018   Procedure: CORONARY STENT INTERVENTION;  Surgeon: [name], MD;  [facility];;  p-mLAD2 80% & mLAD 70% (tandem lesions) - DES PCI Synergy DES 2.75 x 38 (3.0 mm).  . CT CTA CORONARY W/CA SCORE W/CM &/OR WO/CM  06/2018   Cardiac cath score read 1318.  Moderate coronary disease sent for FFR: mRCA 0.84 (non-significant), mLAD 0.64 (signifiant), pLOM(Cx) - 0,72 (significant - but distal lesion)  . LEFT HEART CATH AND CORONARY ANGIOGRAPHY N/A 06/30/2018   Procedure: LEFT HEART CATH AND CORONARY ANGIOGRAPHY;  Surgeon: [name], MD;  [facility];;  pLAD1 55% (not significant), p-mLAD2 80% & mLAD 70% (tandem lesions -> DES PCI). mCx ~60% (med Rx), mRCA 30% & 40%.  ER 55-60%.   . TEE WITHOUT CARDIOVERSION N/A 09/06/2015   Procedure: TRANSESOPHAGEAL ECHOCARDIOGRAM (TEE);  Surgeon: Jerline Pain, MD;  R/O endocarditis;  . TONSILLECTOMY  1965    Cardiac Cath2/26/2020:pLAD1 55% (not significant), p-mLAD2 80% & mLAD 70% (tandem lesions) --> - DES PCI Synergy DES 2.75 x 38 (3.0 mm).. mCx ~60%, mRCA 30% &40%. (med Rx)ER 55-60%.     MEDICATIONS/ALLERGIES   Current Meds  Medication Sig  . amLODipine (NORVASC) 5 MG tablet TAKE 1 TABLET BY MOUTH EVERY DAY  . amphetamine-dextroamphetamine (ADDERALL) 30 MG tablet Take 1-2 tablets by mouth daily.  . Ascorbic Acid (VITAMIN C PO) Take 1 tablet by mouth 2 (two) times daily.  . B Complex-C (B-COMPLEX WITH VITAMIN C) tablet Take 1 tablet by mouth daily.  . clopidogrel (PLAVIX) 75 MG tablet TAKE 1 TABLET BY MOUTH EVERY DAY WITH BREAKFAST  . Coenzyme Q10 (COQ10) 100 MG CAPS Take 100 mg by mouth 2 (two) times daily.  . Evolocumab (REPATHA SURECLICK) [id number] MG/ML SOAJ Inject 140 mg into the skin  every 14 (fourteen) days.  Marland Kitchen LORazepam (ATIVAN) 1 MG tablet Take 1 tablet (1 mg total) by mouth at bedtime. (Patient taking differently: Take 1 mg by mouth at bedtime. 0.5 tablets at night)  . meloxicam (MOBIC) 15 MG tablet Take 0.5-1 tablets (7.5-15 mg total) by mouth daily as needed for pain.  . Misc Natural Products (PROSTATE SUPPORT PO) Take 1 capsule by mouth every evening. Prostate Plus  . nitroGLYCERIN (NITROSTAT) 0.4 MG SL tablet Place 1 tablet (0.4 mg total) under the tongue every 5 (five) minutes as needed for chest pain.  . NON FORMULARY Take 1 capsule by mouth daily. MK-7 Supplements  . Omega-3 Fatty Acids (SUPER OMEGA 3 EPA/DHA PO) Take 1 capsule by mouth daily.  Marland Kitchen OVER THE COUNTER MEDICATION Take 1 capsule by mouth daily. Nitric oxide supplements  . testosterone cypionate (DEPOTESTOSTERONE CYPIONATE) 200 MG/ML injection  Inject 0.3 ML once a week  . vitamin E 400 UNIT capsule Take 400 Units by mouth daily.    Allergies  Allergen Reactions  . Penicillins Hives    Did it involve swelling of the face/tongue/throat, SOB, or low BP? Unknown Did it involve sudden or severe rash/hives, skin peeling, or any reaction on the inside of your mouth or nose? Yes Did you need to seek medical attention at a hospital or doctor's office? Unknown When did it last happen?Occurred at age 47 or 45 If all above answers are "NO", may proceed with cephalosporin use.   . Statins Other (See Comments)    confusion  . Methylprednisolone Other (See Comments)    Unsure of exact reaction type  . Amphetamines Other (See Comments)    Intolerant to specific formulations: [employer] amphetamine TEVA dextroamp amphetamine  . Lorazepam Other (See Comments)    [employer] lorazepam; others ok  . Propofol Other (See Comments)    Cognitive delay and emotional    SOCIAL HISTORY/FAMILY HISTORY   Social History   Tobacco Use  . Smoking status: Former Smoker    Packs/day: 1.50    Years: 25.00    Pack years:  37.50    Types: Cigarettes    Quit date: 11/22/2014    Years since quitting: 4.5  . Smokeless tobacco: Never Used  . Tobacco comment: Encouraged to remain smoke free  Substance Use Topics  . Alcohol use: No    Alcohol/week: 0.0 standard drinks  . Drug use: No   Social History   Social History Narrative   Divorced. B.A. degree. Retired.   Drinks caffeine, uses herbal remedies, takes a daily vitamin.   Wears his seatbelt.  Smoke detector in the home.   Firearms locked in the home.   Feels safe in relationships.     Family History family history includes Cancer in his paternal grandfather; Heart disease (age of onset: 95) in his maternal grandfather; Hyperlipidemia in his father; Mental illness in his brother and mother.  OBJCTIVE -PE, EKG, labs   Wt Readings from Last 3 Encounters:  06/15/19 185 lb 6.4 oz (84.1 kg)  06/08/19 187 lb (84.8 kg)  05/25/19 189 lb (85.7 kg)    Physical Exam: BP (!) 150/94   Pulse 67   Ht 6\' 1"  (1.854 m)   Wt 185 lb 6.4 oz (84.1 kg)   SpO2 99%   BMI 24.46 kg/m  Physical Exam   Adult ECG Report Not checked  Recent Labs:    Lab Results  Component Value Date   CHOL 197 02/08/2019   HDL 79 (A) 02/08/2019   LDLCALC 106 02/08/2019   TRIG 57 02/08/2019   CHOLHDL 3 12/15/2017   Lab Results  Component Value Date   CREATININE 0.98 06/08/2019   BUN 20 06/08/2019   NA 137 06/08/2019   K 4.1 06/08/2019   CL 102 06/08/2019   CO2 26 06/08/2019   Lab Results  Component Value Date   TSH 2.67 12/15/2017    ASSESSMENT/PLAN    Problem List Items Addressed This Visit    CAD S/P percutaneous coronary angioplasty (Chronic)    Single-vessel CAD with LAD PCI.  He is now just about a year out from his PCI.  At this point I think we can continue on with Plavix but able to be interrupted.  --Okay to hold Plavix 5 to 7 days preop for procedures or surgeries.      Relevant Orders   Lipid panel  Hepatic function panel   Coronary artery  disease involving native coronary artery of native heart without angina pectoris - Primary (Chronic)    Continue medical management management for RCA and circumflex disease thought not to be physiologically significant.  No angina symptoms since PCI to LAD.  He is on amlodipine and no longer on Imdur.   He is not on ARB or ACE inhibitor because of borderline blood pressure in the past, but that may need to be the next consideration.  Not on beta-blocker because of history of fatigue and bradycardia in the past.  Had started on Repatha, but stopped after couple injections--needs to be reevaluated.       Hyperlipidemia with target LDL less than 70 (Chronic)    He had started Repatha, he did about 3 injections, and then had some strange symptoms of muscle pains so he stopped taking them.  Very on her dose to have myalgias symptoms with Repatha, I have CVRR touch base with him again about try to get back on Repatha or if not Repatha, Praluent.  He has been pretty intolerant of statins, and his lipids are not adequate control. We will check labs next week to have a new baseline.      Relevant Orders   Lipid panel   Hepatic function panel   Essential hypertension (Chronic)    Blood pressure is high today, which is unusual for him.  He just had a telephone altercation before coming in and is little bit worked up.  He says his blood pressures are not like this at home. He is on amlodipine for blood pressure control as well as history of Raynaud's and antianginal benefit. He does have room to titrate amlodipine, and the next option would be to add ARB.           COVID-19 Education: The signs and symptoms of COVID-19 were discussed with the patient and how to seek care for testing (follow up with PCP or arrange E-visit).   The importance of social distancing was discussed today.  I spent a total of 18 minutes with the patient. >  50% of the time was spent in direct patient consultation.    Additional time spent with chart review  / charting (studies, outside notes, etc): 6 Total Time: 24 min   Current medicines are reviewed at length with the patient today.  (+/- concerns) none   Patient Instructions / Medication Changes & Studies & Tests Ordered   Patient Instructions  Medication Instructions:  No changes  Will have the CVRR-pharmacist contact you about Repatha *If you need a refill on your cardiac medications before your next appointment, please call your pharmacy*  Lab Work: Lipid Liver panel  If you have labs (blood work) drawn today and your tests are completely normal, you will receive your results only by: Marland Kitchen MyChart Message (if you have MyChart) OR . A paper copy in the mail If you have any lab test that is abnormal or we need to change your treatment, we will call you to review the results.  Testing/Procedures:  not needed  Follow-Up: At [facility], you and your health needs are our priority.  As part of our continuing mission to provide you with exceptional heart care, we have created designated Provider Care Teams.  These Care Teams include your primary Cardiologist (physician) and Advanced Practice Providers (APPs -  Physician Assistants and Nurse Practitioners) who all work together to provide you with the care you need, when you  need it.  Your next appointment:   6 month(s)  The format for your next appointment:   In Person  Provider:   [name], MD   Other Instructions If needed you may hold plavix if needed for procedure , please have the doctor's office to contact us for a official  Notation.     Studies Ordered:   Orders Placed This Encounter  Procedures  . Lipid panel  . Hepatic function panel     [name], M.D., M.S. Interventional Cardiologist   Pager # [phone] Phone # [phone] [address] [place], [place] [address]   Thank you for choosing Heartcare at [facility]!!

## 2019-06-16 ENCOUNTER — Telehealth: Payer: Self-pay

## 2019-06-16 ENCOUNTER — Telehealth: Payer: Self-pay | Admitting: Cardiology

## 2019-06-16 NOTE — Telephone Encounter (Signed)
Called and lmomed the pt to call us back to schedule an appt for a lipid consult

## 2019-06-16 NOTE — Telephone Encounter (Signed)
Lonn [facility] calling stating she will be faxing a referral over to our office to be signed.

## 2019-06-17 ENCOUNTER — Encounter: Payer: Self-pay | Admitting: Cardiology

## 2019-06-17 NOTE — Telephone Encounter (Signed)
Left message received form awaitng  For signature will fax once  completed

## 2019-06-17 NOTE — Assessment & Plan Note (Addendum)
He had started Repatha, he did about 3 injections, and then had some strange symptoms of muscle pains so he stopped taking them.  Very on her dose to have myalgias symptoms with Repatha, I have CVRR touch base with him again about try to get back on Repatha or if not Repatha, Praluent.  He has been pretty intolerant of statins, and his lipids are not adequate control. We will check labs next week to have a new baseline.

## 2019-06-17 NOTE — Assessment & Plan Note (Signed)
Blood pressure is high today, which is unusual for him.  He just had a telephone altercation before coming in and is little bit worked up.  He says his blood pressures are not like this at home. He is on amlodipine for blood pressure control as well as history of Raynaud's and antianginal benefit. He does have room to titrate amlodipine, and the next option would be to add ARB.

## 2019-06-17 NOTE — Assessment & Plan Note (Signed)
Continue medical management management for RCA and circumflex disease thought not to be physiologically significant.  No angina symptoms since PCI to LAD.  He is on amlodipine and no longer on Imdur.   He is not on ARB or ACE inhibitor because of borderline blood pressure in the past, but that may need to be the next consideration.  Not on beta-blocker because of history of fatigue and bradycardia in the past.  Had started on Repatha, but stopped after couple injections--needs to be reevaluated.

## 2019-06-17 NOTE — Assessment & Plan Note (Signed)
Single-vessel CAD with LAD PCI.  He is now just about a year out from his PCI.  At this point I think we can continue on with Plavix but able to be interrupted.  --Okay to hold Plavix 5 to 7 days preop for procedures or surgeries.

## 2019-06-20 ENCOUNTER — Ambulatory Visit: Payer: BC Managed Care – PPO | Admitting: Family Medicine

## 2019-06-20 ENCOUNTER — Encounter: Payer: Self-pay | Admitting: Family Medicine

## 2019-06-20 ENCOUNTER — Other Ambulatory Visit: Payer: Self-pay

## 2019-06-20 VITALS — BP 125/84 | HR 78

## 2019-06-20 DIAGNOSIS — A449 Bartonellosis, unspecified: Secondary | ICD-10-CM

## 2019-06-20 DIAGNOSIS — A770 Spotted fever due to Rickettsia rickettsii: Secondary | ICD-10-CM

## 2019-06-20 DIAGNOSIS — I10 Essential (primary) hypertension: Secondary | ICD-10-CM

## 2019-06-20 DIAGNOSIS — Z8619 Personal history of other infectious and parasitic diseases: Secondary | ICD-10-CM | POA: Diagnosis not present

## 2019-06-20 DIAGNOSIS — I7300 Raynaud's syndrome without gangrene: Secondary | ICD-10-CM | POA: Diagnosis not present

## 2019-06-20 DIAGNOSIS — I2510 Atherosclerotic heart disease of native coronary artery without angina pectoris: Secondary | ICD-10-CM

## 2019-06-20 DIAGNOSIS — E785 Hyperlipidemia, unspecified: Secondary | ICD-10-CM

## 2019-06-20 DIAGNOSIS — R739 Hyperglycemia, unspecified: Secondary | ICD-10-CM

## 2019-06-20 NOTE — Progress Notes (Signed)
Office Visit Note   Patient: Phillip [name]           Date of Birth: 1955/06/05           MRN: [id number] Visit Date: 06/20/2019 Requested by: [name], DO [id number] [place],   [id number] PCP: [name], DO  Subjective: Chief Complaint  Patient presents with  . discuss chronic inflammatory process    HPI: He is here with concerns about inflammation.  Four ago he was diagnosed with [facility] spotted fever.  Subsequently he had additional testing and was positive for Bartonella and Ehrlichia.  He was treated with doxycycline for all of these.  As a result of his RMSF, he developed hypogonadism and is now on testosterone therapy.  This past year he had a CT calcium score which was extremely high and he underwent cardiac stent placement.  He has had CT scan of his chest which showed changes of emphysema.  Many years ago he was a smoker but he has been tobacco free for a long time now.  He does not think all of these findings would be related to his tobacco use and wonders whether he might have inflammation in his body related to his previous infection.  The eats what he describes as an anti-inflammatory diet.  He tries to stay physically active.               ROS:   All other systems were reviewed and are negative.  Objective: Vital Signs: BP 125/84   Pulse 78   Physical Exam:  General:  Alert and oriented, in no acute distress. Pulm:  Breathing unlabored. Psy:  Normal mood, congruent affect. Skin:  No rash or suspicious lesions.    Imaging: None  Assessment & Plan: 1.  History of [place] spotted fever, Bartonella and Ehrlichia infection, now with coronary artery disease, Raynaud's disease, and emphysema.  Uncertain whether they are connected. -We will come back labs to evaluate for inflammatory markers. -Testing for the original infections again, but the tests he was looking at were IgG tests and I do not think that would tell us whether he has active  infection.     Procedures: No procedures performed  No notes on file     PMFS History: Patient Active Problem List   Diagnosis Date Noted  . Mild emphysema ([place]) 05/27/2019  . Essential hypertension 08/11/2018  . Statin intolerance 08/11/2018  . Coronary artery disease involving native coronary artery of native heart without angina pectoris 07/18/2018  . CAD S/P percutaneous coronary angioplasty 06/30/2018  . Abnormal computed tomography angiography (CTA)   . Hyperlipidemia with target LDL less than 70 05/22/2018  . 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 Chronic prescription benzodiazepine use 09/14/2017  . Chronic insomnia 09/14/2017  . Accidental testosterone overdose 03/16/2017  . Erectile dysfunction 12/09/2016  . 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 Encephalopathy, traumatic 10/08/2015  . 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 Raynaud's disease 10/08/2015  . 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 Anxiety   . Cognitive impairment 01/31/2013  . 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 ADD (attention deficit disorder) 03/10/2012   Past Medical History:  Diagnosis Date  . ADD (attention deficit disorder)    on adderrall managed by neurology  . Anxiety    on ativan managed by Neurology  . 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 CAD  S/P percutaneous coronary angioplasty 06/2017   pLAD1 55% (not significant), p-mLAD2 80% & mLAD 70% (tandem lesions - DES PCI . Synergy DES 2.75 x 38 -- 3.0 mm).  mCx ~60% - med Rx, mRCA 30% & 40%.  ER 55-60%.  . Cellulitis   . Chronic traumatic encephalopathy    right frontal-temproal lobe  . Complication of anesthesia    " BRAIN FOG "  . Coronary artery calcification seen on CAT scan 05/12/2018   Triple- by CT - lung ca screen 04/2018  Cardio 06/2018: Coronary calcium score results shows aortic atherosclerosis (with mild dilation) as well as  left main and three-vessel coronary artery calcification. The calcium score is 1168 which is very high risk amount of calcium.  Based on these findings, I would recommend proceeding with a coronary CT angiogram (a more detailed study with Intravenous X  . Depression   . Erectile dysfunction 11/2016   follows with urology; sildenafil prescribed  . Meningoencephalitis 1994   [place]  . Pneumonia   . Primary hypogonadism in male   . Raynaud's disease 123XX123   As complication of RMSF  . Restless legs 09/13/2015  . [place] spotted fever 2017  . TBI (traumatic brain injury) ([place]) 1997, 2009   "concussions"    Family History  Problem Relation Age of Onset  . Hyperlipidemia Father   . Heart disease Maternal Grandfather 75  . Cancer Paternal Grandfather   . Mental illness Mother   . Mental illness Brother     Past Surgical History:  Procedure Laterality Date  . CORONARY STENT INTERVENTION N/A 06/30/2018   Procedure: CORONARY STENT INTERVENTION;  Surgeon: [name], MD;  [facility];;  p-mLAD2 80% & mLAD 70% (tandem lesions) - DES PCI Synergy DES 2.75 x 38 (3.0 mm).  . CT CTA CORONARY W/CA SCORE W/CM &/OR WO/CM  06/2018   Cardiac cath score read 1318.  Moderate coronary disease sent for FFR: mRCA 0.84 (non-significant), mLAD 0.64 (signifiant), pLOM(Cx) - 0,72 (significant - but distal lesion)  . LEFT HEART CATH AND CORONARY ANGIOGRAPHY N/A 06/30/2018   Procedure: LEFT HEART CATH AND CORONARY ANGIOGRAPHY;  Surgeon: [name], MD;  [facility];;  pLAD1 55% (not significant), p-mLAD2 80% & mLAD 70% (tandem lesions -> DES PCI). mCx ~60% (med Rx), mRCA 30% & 40%.  ER 55-60%.   . TEE WITHOUT CARDIOVERSION N/A 09/06/2015   Procedure: TRANSESOPHAGEAL ECHOCARDIOGRAM (TEE);  Surgeon: Jerline Pain, MD;  R/O endocarditis;  . TONSILLECTOMY  1965   Social History   Occupational History  . Occupation: Retired  Tobacco Use  . Smoking status:  Former Smoker    Packs/day: 1.50    Years: 25.00    Pack years: 37.50    Types: Cigarettes    Quit date: 11/22/2014    Years since quitting: 4.5  . Smokeless tobacco: Never Used  . Tobacco comment: Encouraged to remain smoke free  Substance and Sexual Activity  . Alcohol use: No    Alcohol/week: 0.0 standard drinks  . Drug use: No  . Sexual activity: Yes    Partners: Female

## 2019-06-22 DIAGNOSIS — M25511 Pain in right shoulder: Secondary | ICD-10-CM | POA: Diagnosis not present

## 2019-06-22 NOTE — Addendum Note (Signed)
Addended by: Marlyne Beards on: 06/22/2019 03:15 PM   Modules accepted: Orders

## 2019-06-22 NOTE — Addendum Note (Signed)
Addended by: Marlyne Beards on: 06/22/2019 03:16 PM   Modules accepted: Orders

## 2019-06-22 NOTE — Addendum Note (Signed)
Addended by: Marlyne Beards on: 06/22/2019 02:10 PM   Modules accepted: Orders

## 2019-06-24 ENCOUNTER — Other Ambulatory Visit: Payer: Self-pay | Admitting: Family Medicine

## 2019-06-24 DIAGNOSIS — E785 Hyperlipidemia, unspecified: Secondary | ICD-10-CM

## 2019-06-27 ENCOUNTER — Other Ambulatory Visit: Payer: Self-pay

## 2019-06-27 ENCOUNTER — Ambulatory Visit (INDEPENDENT_AMBULATORY_CARE_PROVIDER_SITE_OTHER): Payer: BC Managed Care – PPO | Admitting: Psychology

## 2019-06-27 DIAGNOSIS — I10 Essential (primary) hypertension: Secondary | ICD-10-CM

## 2019-06-27 DIAGNOSIS — I7300 Raynaud's syndrome without gangrene: Secondary | ICD-10-CM

## 2019-06-27 DIAGNOSIS — E785 Hyperlipidemia, unspecified: Secondary | ICD-10-CM

## 2019-06-27 DIAGNOSIS — A770 Spotted fever due to Rickettsia rickettsii: Secondary | ICD-10-CM

## 2019-06-27 DIAGNOSIS — A449 Bartonellosis, unspecified: Secondary | ICD-10-CM

## 2019-06-27 DIAGNOSIS — R739 Hyperglycemia, unspecified: Secondary | ICD-10-CM

## 2019-06-27 DIAGNOSIS — F064 Anxiety disorder due to known physiological condition: Secondary | ICD-10-CM | POA: Diagnosis not present

## 2019-06-27 DIAGNOSIS — Z8619 Personal history of other infectious and parasitic diseases: Secondary | ICD-10-CM

## 2019-06-27 DIAGNOSIS — I2510 Atherosclerotic heart disease of native coronary artery without angina pectoris: Secondary | ICD-10-CM

## 2019-06-28 DIAGNOSIS — M25511 Pain in right shoulder: Secondary | ICD-10-CM | POA: Diagnosis not present

## 2019-06-30 DIAGNOSIS — Z9861 Coronary angioplasty status: Secondary | ICD-10-CM | POA: Diagnosis not present

## 2019-06-30 DIAGNOSIS — I2510 Atherosclerotic heart disease of native coronary artery without angina pectoris: Secondary | ICD-10-CM | POA: Diagnosis not present

## 2019-07-04 ENCOUNTER — Other Ambulatory Visit: Payer: Self-pay | Admitting: Adult Health

## 2019-07-04 DIAGNOSIS — M25511 Pain in right shoulder: Secondary | ICD-10-CM | POA: Diagnosis not present

## 2019-07-04 NOTE — Telephone Encounter (Signed)
[name],Phillip has called for a refill for pt's amphetamine-dextroamphetamine (ADDERALL) 30 MG tablet Please send refill to :[facility] [place]

## 2019-07-05 DIAGNOSIS — Z9861 Coronary angioplasty status: Secondary | ICD-10-CM | POA: Diagnosis not present

## 2019-07-05 DIAGNOSIS — I2510 Atherosclerotic heart disease of native coronary artery without angina pectoris: Secondary | ICD-10-CM | POA: Diagnosis not present

## 2019-07-05 NOTE — Telephone Encounter (Addendum)
Please review.  Fill date: 06/02/2019 Written date: 06/02/2019 QTY: 60  Last ov: 03/29/2019 Next ov: 09/27/2019  OD risk:070 Narx scores... Narcotic:130 Sedative: 311 Stimulate:290

## 2019-07-06 MED ORDER — AMPHETAMINE-DEXTROAMPHETAMINE 30 MG PO TABS: 30.0000 mg | ORAL_TABLET | Freq: Every day | ORAL | 0 refills | Status: DC

## 2019-07-06 NOTE — Addendum Note (Signed)
Addended by: Trudie Buckler on: 07/06/2019 07:53 AM   Modules accepted: Orders

## 2019-07-07 DIAGNOSIS — Z9861 Coronary angioplasty status: Secondary | ICD-10-CM | POA: Diagnosis not present

## 2019-07-07 DIAGNOSIS — I2510 Atherosclerotic heart disease of native coronary artery without angina pectoris: Secondary | ICD-10-CM | POA: Diagnosis not present

## 2019-07-07 NOTE — Telephone Encounter (Addendum)
I called and LMVM for pt that [facility] prescription if needs some where else to let me know.   I saw [facility] in [place] was where he wants to go.

## 2019-07-07 NOTE — Telephone Encounter (Signed)
Pt called and stated that the pharmacy that his amphetamine-dextroamphetamine (ADDERALL) 30 MG tablet was sent to does not have the medication and so now he needs it sent to  [facility] [address]  [place], [place] [address]

## 2019-07-07 NOTE — Addendum Note (Signed)
Addended by: Brandon Melnick on: 07/07/2019 03:26 PM   Modules accepted: Orders

## 2019-07-08 ENCOUNTER — Other Ambulatory Visit (INDEPENDENT_AMBULATORY_CARE_PROVIDER_SITE_OTHER): Payer: Self-pay | Admitting: Family Medicine

## 2019-07-08 DIAGNOSIS — I2510 Atherosclerotic heart disease of native coronary artery without angina pectoris: Secondary | ICD-10-CM | POA: Diagnosis not present

## 2019-07-08 DIAGNOSIS — Z9861 Coronary angioplasty status: Secondary | ICD-10-CM | POA: Diagnosis not present

## 2019-07-08 DIAGNOSIS — A449 Bartonellosis, unspecified: Secondary | ICD-10-CM | POA: Diagnosis not present

## 2019-07-08 DIAGNOSIS — E785 Hyperlipidemia, unspecified: Secondary | ICD-10-CM | POA: Diagnosis not present

## 2019-07-08 DIAGNOSIS — I10 Essential (primary) hypertension: Secondary | ICD-10-CM | POA: Diagnosis not present

## 2019-07-08 DIAGNOSIS — A770 Spotted fever due to Rickettsia rickettsii: Secondary | ICD-10-CM | POA: Diagnosis not present

## 2019-07-09 LAB — LIPID PANEL
Chol/HDL Ratio: 4.5 ratio (ref 0.0–5.0)
Cholesterol, Total: 199 mg/dL (ref 100–199)
HDL: 44 mg/dL (ref >39)
LDL Chol Calc (NIH): 133 mg/dL — ABNORMAL HIGH (ref 0–99)
Triglycerides: 121 mg/dL (ref 0–149)
VLDL Cholesterol Cal: 22 mg/dL (ref 5–40)

## 2019-07-09 LAB — HEPATIC FUNCTION PANEL
ALT: 14 IU/L (ref 0–44)
AST: 23 IU/L (ref 0–40)
Albumin: 4.3 g/dL (ref 3.8–4.8)
Alkaline Phosphatase: 82 IU/L (ref 39–117)
Bilirubin Total: 0.6 mg/dL (ref 0.0–1.2)
Bilirubin, Direct: 0.18 mg/dL (ref 0.00–0.40)
Total Protein: 6.5 g/dL (ref 6.0–8.5)

## 2019-07-10 MED ORDER — AMPHETAMINE-DEXTROAMPHETAMINE 30 MG PO TABS: 30.0000 mg | ORAL_TABLET | Freq: Every day | ORAL | 0 refills | Status: DC

## 2019-07-11 ENCOUNTER — Ambulatory Visit (INDEPENDENT_AMBULATORY_CARE_PROVIDER_SITE_OTHER): Payer: BC Managed Care – PPO | Admitting: Psychology

## 2019-07-11 DIAGNOSIS — M25511 Pain in right shoulder: Secondary | ICD-10-CM | POA: Diagnosis not present

## 2019-07-11 DIAGNOSIS — F064 Anxiety disorder due to known physiological condition: Secondary | ICD-10-CM

## 2019-07-12 ENCOUNTER — Ambulatory Visit: Payer: BC Managed Care – PPO

## 2019-07-12 DIAGNOSIS — I2510 Atherosclerotic heart disease of native coronary artery without angina pectoris: Secondary | ICD-10-CM | POA: Diagnosis not present

## 2019-07-12 DIAGNOSIS — Z9861 Coronary angioplasty status: Secondary | ICD-10-CM | POA: Diagnosis not present

## 2019-07-12 LAB — COMPREHENSIVE METABOLIC PANEL
ALT: 16 IU/L (ref 0–44)
AST: 24 IU/L (ref 0–40)
Albumin/Globulin Ratio: 2.4 — ABNORMAL HIGH (ref 1.2–2.2)
Albumin: 4.7 g/dL (ref 3.8–4.8)
Alkaline Phosphatase: 81 IU/L (ref 39–117)
BUN/Creatinine Ratio: 13 (ref 10–24)
BUN: 12 mg/dL (ref 8–27)
Bilirubin Total: 0.6 mg/dL (ref 0.0–1.2)
CO2: 24 mmol/L (ref 20–29)
Calcium: 9.4 mg/dL (ref 8.6–10.2)
Chloride: 104 mmol/L (ref 96–106)
Creatinine, Ser: 0.91 mg/dL (ref 0.76–1.27)
GFR calc Af Amer: 105 mL/min/{1.73_m2} (ref >59)
GFR calc non Af Amer: 91 mL/min/{1.73_m2} (ref >59)
Globulin, Total: 2 g/dL (ref 1.5–4.5)
Glucose: 91 mg/dL (ref 65–99)
Potassium: 5.1 mmol/L (ref 3.5–5.2)
Sodium: 141 mmol/L (ref 134–144)
Total Protein: 6.7 g/dL (ref 6.0–8.5)

## 2019-07-12 LAB — CBC WITH DIFFERENTIAL/PLATELET
Basophils Absolute: 0.1 10*3/uL (ref 0.0–0.2)
Basos: 2 %
EOS (ABSOLUTE): 0.2 10*3/uL (ref 0.0–0.4)
Eos: 6 %
Hematocrit: 43.5 % (ref 37.5–51.0)
Hemoglobin: 14.3 g/dL (ref 13.0–17.7)
Immature Grans (Abs): 0 10*3/uL (ref 0.0–0.1)
Immature Granulocytes: 0 %
Lymphocytes Absolute: 1.6 10*3/uL (ref 0.7–3.1)
Lymphs: 40 %
MCH: 31.6 pg (ref 26.6–33.0)
MCHC: 32.9 g/dL (ref 31.5–35.7)
MCV: 96 fL (ref 79–97)
Monocytes Absolute: 0.4 10*3/uL (ref 0.1–0.9)
Monocytes: 11 %
Neutrophils Absolute: 1.6 10*3/uL (ref 1.4–7.0)
Neutrophils: 41 %
Platelets: 269 10*3/uL (ref 150–450)
RBC: 4.52 x10E6/uL (ref 4.14–5.80)
RDW: 12.1 % (ref 11.6–15.4)
WBC: 3.9 10*3/uL (ref 3.4–10.8)

## 2019-07-12 LAB — ANA: Anti Nuclear Antibody (ANA): NEGATIVE

## 2019-07-12 LAB — HGB A1C W/O EAG: Hgb A1c MFr Bld: 5 % (ref 4.8–5.6)

## 2019-07-12 LAB — INSULIN, RANDOM: INSULIN: 2.3 u[IU]/mL — ABNORMAL LOW (ref 2.6–24.9)

## 2019-07-12 LAB — HOMOCYSTEINE: Homocysteine: 11 umol/L (ref 0.0–17.2)

## 2019-07-12 LAB — HIGH SENSITIVITY CRP: CRP, High Sensitivity: 0.5 mg/L (ref 0.00–3.00)

## 2019-07-12 LAB — NMR LIPOPROF NOLP+GRAPH
HDL Particle Number: 31.2 umol/L (ref >=30.5)
LDL Particle Number: 1702 nmol/L — ABNORMAL HIGH (ref <1000)
LDL Size: 20.4 nm — ABNORMAL LOW (ref >20.5)
LP-IR Score: 44 (ref <=45)
Small LDL Particle Number: 971 nmol/L — ABNORMAL HIGH (ref <=527)

## 2019-07-12 LAB — INTERLEUKIN-6, SERUM  IL6SL: Interleukin-6, Serum: <2.5 pg/mL (ref 0.0–13.0)

## 2019-07-14 ENCOUNTER — Telehealth: Payer: Self-pay | Admitting: Family Medicine

## 2019-07-14 DIAGNOSIS — I2510 Atherosclerotic heart disease of native coronary artery without angina pectoris: Secondary | ICD-10-CM | POA: Diagnosis not present

## 2019-07-14 DIAGNOSIS — Z9861 Coronary angioplasty status: Secondary | ICD-10-CM | POA: Diagnosis not present

## 2019-07-14 NOTE — Telephone Encounter (Signed)
Labs show:  LDL totals and particle sizes are elevated, but all of the inflammatory markers look great.

## 2019-07-18 DIAGNOSIS — M25511 Pain in right shoulder: Secondary | ICD-10-CM | POA: Diagnosis not present

## 2019-07-19 DIAGNOSIS — I2510 Atherosclerotic heart disease of native coronary artery without angina pectoris: Secondary | ICD-10-CM | POA: Diagnosis not present

## 2019-07-19 DIAGNOSIS — Z9861 Coronary angioplasty status: Secondary | ICD-10-CM | POA: Diagnosis not present

## 2019-07-21 DIAGNOSIS — I2510 Atherosclerotic heart disease of native coronary artery without angina pectoris: Secondary | ICD-10-CM | POA: Diagnosis not present

## 2019-07-21 DIAGNOSIS — Z9861 Coronary angioplasty status: Secondary | ICD-10-CM | POA: Diagnosis not present

## 2019-07-25 ENCOUNTER — Ambulatory Visit: Payer: BC Managed Care – PPO | Admitting: Psychology

## 2019-07-26 DIAGNOSIS — I2510 Atherosclerotic heart disease of native coronary artery without angina pectoris: Secondary | ICD-10-CM | POA: Diagnosis not present

## 2019-07-26 DIAGNOSIS — Z9861 Coronary angioplasty status: Secondary | ICD-10-CM | POA: Diagnosis not present

## 2019-07-28 DIAGNOSIS — I2510 Atherosclerotic heart disease of native coronary artery without angina pectoris: Secondary | ICD-10-CM | POA: Diagnosis not present

## 2019-07-28 DIAGNOSIS — Z9861 Coronary angioplasty status: Secondary | ICD-10-CM | POA: Diagnosis not present

## 2019-08-01 ENCOUNTER — Ambulatory Visit (INDEPENDENT_AMBULATORY_CARE_PROVIDER_SITE_OTHER): Payer: BC Managed Care – PPO | Admitting: Pharmacist Clinician (PhC)/ Clinical Pharmacy Specialist

## 2019-08-01 ENCOUNTER — Other Ambulatory Visit: Payer: Self-pay

## 2019-08-01 DIAGNOSIS — E785 Hyperlipidemia, unspecified: Secondary | ICD-10-CM

## 2019-08-01 NOTE — Assessment & Plan Note (Signed)
Patient with hyperlipidemia and ASCVD, currently not on medication.  Reviewed information about Repatha as well as Bempedoic acid.  With his history and high calcium score, Repatha would be the better option for him.  He is willing to re-challenge with Repatha, now knowing that the side effects he previously noted are transient.  He will take for 3 months then repeat labs.  If the side effects continue beyond that, we can then consider the bempedoic acid/ezetimibe combination tablet.

## 2019-08-01 NOTE — Patient Instructions (Signed)
Your Results:             Your most recent labs Goal  Total Cholesterol 199 < 200  Triglycerides 121 < 150  HDL (happy/good cholesterol) 44 > 40  LDL (lousy/bad cholesterol 133 < 70     Medication changes:  Start Repatha 140 mg once every 2 weeks  Patient Assistance:  The [employer] foundation offers assistance to help pay for medication copays.  They will cover copays for all cholesterol lowering meds, including statins, fibrates, omega-3 oils, ezetimibe, Repatha, Praluent, Nexletol, Nexlizet.  The cards are usually good for $2,500 or 12 months, whichever comes first. 1. Go to healthwellfoundation.org 2. Click on "Apply Now" 3. Answer questions as to whom is applying (patient or representative) 4. Your disease fund will be "hypercholesterolemia - Medicare access" 5. They will ask questions about finances and which medications you are taking for cholesterol 6. When you submit, the approval is usually within minutes.  You will need to print the card information from the site 7. You will need to show this information to your pharmacy, they will bill your Medicare Part D plan first -then bill [employer] --for the copay.   You can also call them at [phone], although the hold times can be quite long.   Thank you for choosing [facility]

## 2019-08-01 NOTE — Progress Notes (Signed)
08/01/2019 Phillip [name] 08-02-1955 [id number]   HPI:  Phillip [name] is a 64 y.o. male patient of Dr Ellyn Hack, who presents today for a lipid clinic evaluation.  See pertinent past medical history below.  He was last seen by Dr. Ellyn Hack in February and it was noted that he had stopped using Repatha last year after just 2-3 injections.   Today he states that the injections caused him to feel flu-like and achy.  We discussed this can be a side effect of the medication, but that it usually fades away after 5-6 doses.  He is willing to re-try the medication.    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  (also had [place])   Current Medications: none  Cholesterol Goals: LDL < 70   Intolerant/previously tried: atorvastatin, rosuvastatin both caused cognitive issues  Family history: mother living at 33, father died at 39 from fall/quadrapalegia complications; one brother deceased ,non cardiac  Diet: changed diet to no meats, fats or oils; "Ornish" program -  Using beans, tofu, imitation meats for protein; plenty of vegetables    Exercise:  Walk or run 5 times per week  Labs: NMR 3/21:  LDL particle number [id number], small LDL particle number [id number]; TC 199, TG 121, HDL 44, LDL 133   Current Outpatient Medications  Medication Sig Dispense Refill  . amLODipine (NORVASC) 5 MG tablet TAKE 1 TABLET BY MOUTH EVERY DAY 90 tablet 2  . amphetamine-dextroamphetamine (ADDERALL) 30 MG tablet Take 1-2 tablets by mouth daily. 60 tablet 0  . Ascorbic Acid (VITAMIN C PO) Take 1 tablet by mouth 2 (two) times daily.    . B Complex-C (B-COMPLEX WITH VITAMIN C) tablet Take 1 tablet by mouth daily.    . clopidogrel (PLAVIX) 75 MG tablet TAKE 1 TABLET BY MOUTH EVERY DAY WITH BREAKFAST 90 tablet 2  . Coenzyme Q10 (COQ10) 100  MG CAPS Take 100 mg by mouth 2 (two) times daily.    Marland Kitchen LORazepam (ATIVAN) 1 MG tablet Take 1 tablet (1 mg total) by mouth at bedtime. (Patient taking differently: Take 1 mg by mouth at bedtime. 0.5 tablets at night) 30 tablet 5  . meloxicam (MOBIC) 15 MG tablet Take 0.5-1 tablets (7.5-15 mg total) by mouth daily as needed for pain. 30 tablet 6  . Misc Natural Products (PROSTATE SUPPORT PO) Take 1 capsule by mouth every evening. Prostate Plus    . nitroGLYCERIN (NITROSTAT) 0.4 MG SL tablet Place 1 tablet (0.4 mg total) under the tongue every 5 (five) minutes as needed for chest pain. 25 tablet 3  . Omega-3 Fatty Acids (SUPER OMEGA 3 EPA/DHA PO) Take 1 capsule by mouth daily.    Marland Kitchen testosterone cypionate (DEPOTESTOSTERONE CYPIONATE) 200 MG/ML injection Inject 0.3 ML once a week 10 mL 2   No current facility-administered medications for this visit.    Allergies  Allergen Reactions  . Penicillins Hives    Did it involve swelling of the face/tongue/throat, SOB, or low BP? Unknown Did it involve sudden or severe rash/hives, skin peeling, or any reaction on the inside of your mouth or nose? Yes Did you need to seek medical attention at a hospital or doctor's office? Unknown When did it last happen?Occurred at age 21 or 41 If all above answers are "NO", may  proceed with cephalosporin use.   . Statins Other (See Comments)    confusion  . Methylprednisolone Other (See Comments)    Unsure of exact reaction type  . Amphetamines Other (See Comments)    Intolerant to specific formulations: [employer] amphetamine TEVA dextroamp amphetamine  . Lorazepam Other (See Comments)    Mylan lorazepam; others ok  . Propofol Other (See Comments)    Cognitive delay and emotional    Past Medical History:  Diagnosis Date  . ADD (attention deficit disorder)    on adderrall managed by neurology  . Anxiety    on ativan managed by Neurology  . 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 CAD S/P percutaneous coronary angioplasty 06/2017   pLAD1 55% (not significant), p-mLAD2 80% & mLAD 70% (tandem lesions - DES PCI . Synergy DES 2.75 x 38 -- 3.0 mm).  mCx ~60% - med Rx, mRCA 30% & 40%.  ER 55-60%.  . Cellulitis   . Chronic traumatic encephalopathy    right frontal-temproal lobe  . Complication of anesthesia    " BRAIN FOG "  . Coronary artery calcification seen on CAT scan 05/12/2018   Triple- by CT - lung ca screen 04/2018  Cardio 06/2018: Coronary calcium score results shows aortic atherosclerosis (with mild dilation) as well as left main and three-vessel coronary artery calcification. The calcium score is 1168 which is very high risk amount of calcium.  Based on these findings, I would recommend proceeding with a coronary CT angiogram (a more detailed study with Intravenous X  . Depression   . Erectile dysfunction 11/2016   follows with urology; sildenafil prescribed  . Meningoencephalitis 1994   [place]  . Pneumonia   . Primary hypogonadism in male   . Raynaud's disease 123XX123   As complication of RMSF  . Restless legs 09/13/2015  . [place] spotted fever 2017  . TBI (traumatic brain injury) ([place]) 1997, 2009   "concussions"    Blood pressure 136/82, pulse 70, resp. rate 15, height 6\' 1"  (1.854 m), weight 181 lb 6.4 oz (82.3 kg), SpO2 97 %.   Hyperlipidemia with target LDL less than 70 Patient with hyperlipidemia and ASCVD, currently not on medication.  Reviewed information about Repatha as well as Bempedoic acid.  With his history and high calcium score, Repatha would be the better option for him.  He is willing to re-challenge with Repatha, now knowing that the side effects he previously noted are transient.  He will take for 3 months then repeat labs.  If the side effects continue beyond that, we can then consider the bempedoic acid/ezetimibe combination tablet.     Tommy Medal PharmD CPP  [facility] [address] [place], [place] [address] [phone]

## 2019-08-02 DIAGNOSIS — I2510 Atherosclerotic heart disease of native coronary artery without angina pectoris: Secondary | ICD-10-CM | POA: Diagnosis not present

## 2019-08-02 DIAGNOSIS — Z9861 Coronary angioplasty status: Secondary | ICD-10-CM | POA: Diagnosis not present

## 2019-08-03 ENCOUNTER — Telehealth: Payer: Self-pay

## 2019-08-03 MED ORDER — REPATHA SURECLICK 140 MG/ML ~~LOC~~ SOAJ: 140.0000 mg | SUBCUTANEOUS | 11 refills | Status: DC

## 2019-08-03 NOTE — Telephone Encounter (Signed)
Called and lmomed the pt to start the repatha and that it was sent to pharmacy on file. Instructed the pt to call back if unaffordable or if they have any questions

## 2019-08-04 ENCOUNTER — Other Ambulatory Visit: Payer: Self-pay | Admitting: Adult Health

## 2019-08-04 DIAGNOSIS — Z9861 Coronary angioplasty status: Secondary | ICD-10-CM | POA: Diagnosis not present

## 2019-08-04 DIAGNOSIS — I2510 Atherosclerotic heart disease of native coronary artery without angina pectoris: Secondary | ICD-10-CM | POA: Diagnosis not present

## 2019-08-04 NOTE — Telephone Encounter (Signed)
Pt has called for a refill on his amphetamine-dextroamphetamine (ADDERALL) 30 MG tablet [id number]

## 2019-08-08 ENCOUNTER — Ambulatory Visit (INDEPENDENT_AMBULATORY_CARE_PROVIDER_SITE_OTHER): Payer: BC Managed Care – PPO | Admitting: Psychology

## 2019-08-08 DIAGNOSIS — F064 Anxiety disorder due to known physiological condition: Secondary | ICD-10-CM | POA: Diagnosis not present

## 2019-08-08 MED ORDER — AMPHETAMINE-DEXTROAMPHETAMINE 30 MG PO TABS: 30.0000 mg | ORAL_TABLET | Freq: Every day | ORAL | 0 refills | Status: DC

## 2019-08-08 NOTE — Addendum Note (Signed)
Addended by: Oliver Hum S on: 08/08/2019 12:56 PM   Modules accepted: Orders

## 2019-08-08 NOTE — Telephone Encounter (Signed)
Phone rep checked office voicemail's,pt is asking for a call from RN re: his refill requested on 04-01.  Please call

## 2019-08-11 DIAGNOSIS — I2510 Atherosclerotic heart disease of native coronary artery without angina pectoris: Secondary | ICD-10-CM | POA: Diagnosis not present

## 2019-08-11 DIAGNOSIS — Z9861 Coronary angioplasty status: Secondary | ICD-10-CM | POA: Diagnosis not present

## 2019-08-16 DIAGNOSIS — Z9861 Coronary angioplasty status: Secondary | ICD-10-CM | POA: Diagnosis not present

## 2019-08-16 DIAGNOSIS — I2510 Atherosclerotic heart disease of native coronary artery without angina pectoris: Secondary | ICD-10-CM | POA: Diagnosis not present

## 2019-08-18 DIAGNOSIS — Z9861 Coronary angioplasty status: Secondary | ICD-10-CM | POA: Diagnosis not present

## 2019-08-18 DIAGNOSIS — I2510 Atherosclerotic heart disease of native coronary artery without angina pectoris: Secondary | ICD-10-CM | POA: Diagnosis not present

## 2019-08-22 ENCOUNTER — Ambulatory Visit: Payer: BC Managed Care – PPO | Admitting: Infectious Disease

## 2019-08-22 ENCOUNTER — Ambulatory Visit: Payer: BC Managed Care – PPO | Admitting: Psychology

## 2019-08-23 DIAGNOSIS — Z9861 Coronary angioplasty status: Secondary | ICD-10-CM | POA: Diagnosis not present

## 2019-08-23 DIAGNOSIS — I2510 Atherosclerotic heart disease of native coronary artery without angina pectoris: Secondary | ICD-10-CM | POA: Diagnosis not present

## 2019-08-25 DIAGNOSIS — Z9861 Coronary angioplasty status: Secondary | ICD-10-CM | POA: Diagnosis not present

## 2019-08-25 DIAGNOSIS — I2510 Atherosclerotic heart disease of native coronary artery without angina pectoris: Secondary | ICD-10-CM | POA: Diagnosis not present

## 2019-08-26 DIAGNOSIS — I2510 Atherosclerotic heart disease of native coronary artery without angina pectoris: Secondary | ICD-10-CM | POA: Diagnosis not present

## 2019-08-30 DIAGNOSIS — I2510 Atherosclerotic heart disease of native coronary artery without angina pectoris: Secondary | ICD-10-CM | POA: Diagnosis not present

## 2019-08-30 DIAGNOSIS — Z9861 Coronary angioplasty status: Secondary | ICD-10-CM | POA: Diagnosis not present

## 2019-09-05 ENCOUNTER — Ambulatory Visit: Payer: BC Managed Care – PPO | Admitting: Psychology

## 2019-09-05 ENCOUNTER — Other Ambulatory Visit: Payer: Self-pay | Admitting: Adult Health

## 2019-09-05 MED ORDER — AMPHETAMINE-DEXTROAMPHETAMINE 30 MG PO TABS: 30.0000 mg | ORAL_TABLET | Freq: Every day | ORAL | 0 refills | Status: DC

## 2019-09-05 NOTE — Telephone Encounter (Signed)
1) Medication(s) Requested (by name): amphetamine-dextroamphetamine (ADDERALL) 30 MG tablet   2) [id number] [place], [place] - [address]  [address] [place] [address]

## 2019-09-19 ENCOUNTER — Ambulatory Visit: Payer: BC Managed Care – PPO | Admitting: Psychology

## 2019-09-21 ENCOUNTER — Ambulatory Visit: Payer: BC Managed Care – PPO | Admitting: Infectious Disease

## 2019-09-26 ENCOUNTER — Other Ambulatory Visit: Payer: Self-pay

## 2019-09-26 ENCOUNTER — Encounter: Payer: Self-pay | Admitting: Adult Health

## 2019-09-26 ENCOUNTER — Ambulatory Visit: Payer: BC Managed Care – PPO | Admitting: Adult Health

## 2019-09-26 VITALS — BP 118/74 | HR 67 | Ht 73.0 in | Wt 177.0 lb

## 2019-09-26 DIAGNOSIS — F5104 Psychophysiologic insomnia: Secondary | ICD-10-CM

## 2019-09-26 DIAGNOSIS — F901 Attention-deficit hyperactivity disorder, predominantly hyperactive type: Secondary | ICD-10-CM | POA: Diagnosis not present

## 2019-09-26 MED ORDER — LORAZEPAM 0.5 MG PO TABS: 0.2500 mg | ORAL_TABLET | Freq: Every day | ORAL | 1 refills | Status: DC

## 2019-09-26 NOTE — Patient Instructions (Signed)
Your Plan:  Continue Adderall Decrease Ativan 0.25 mg at bedtime If your symptoms worsen or you develop new symptoms please let us know.    Thank you for coming to see [place] at [facility]. I hope we have been able to provide you high quality care today.  You may receive a patient satisfaction survey over the next few weeks. We would appreciate your feedback and comments so that we may continue to improve ourselves and the health of our patients.

## 2019-09-26 NOTE — Progress Notes (Signed)
PATIENT: Phillip [name] DOB: 08-19-1955  REASON FOR VISIT: follow up HISTORY FROM: patient  HISTORY OF PRESENT ILLNESS: Today 09/26/19:  Phillip [name] is a 65 year old male with a history of insomnia and ADHD.  He returns today for follow-up.  He has reduced Ativan to 0.5 mg at bedtime.  Reports that this continues to work fairly well for him.  He would like to further reduce his dose.  He continues on Adderall 60 mg daily.  Reports that he also switched his diet to a plant-based diet.  He returns today for an evaluation.  HISTORY 03/29/19:  Phillip [name] is a 64 year old male with a history of insomnia and ADHD.  He returns today for follow-up.  He continues on Adderall 60 mg daily.  He reports that this continues to work well for him.  He has continue trying to wean off of Ativan.  He is now taking 1 1/2 mg alternating with 1 milligram every other night.  He states that this is working well for him.  He would like to continue to decrease his dose.  When he had his sleep study in 2014 he did have  periodic limb movement disorder.  He was tried on Requip but reports that it caused confusion.  Since then he has not been on any other medication.  He returns today for an evaluation.  REVIEW OF SYSTEMS: Out of a complete 14 system review of symptoms, the patient complains only of the following symptoms, and all other reviewed systems are negative.  See HPI  ALLERGIES: Allergies  Allergen Reactions  . Penicillins Hives    Did it involve swelling of the face/tongue/throat, SOB, or low BP? Unknown Did it involve sudden or severe rash/hives, skin peeling, or any reaction on the inside of your mouth or nose? Yes Did you need to seek medical attention at a hospital or doctor's office? Unknown When did it last happen?Occurred at age 33 or 64 If all above answers are "NO", may proceed with cephalosporin use.   . Statins Other (See Comments)    confusion  . Methylprednisolone Other (See Comments)   Unsure of exact reaction type  . Amphetamines Other (See Comments)    Intolerant to specific formulations: [employer] amphetamine TEVA dextroamp amphetamine  . Lorazepam Other (See Comments)    Mylan lorazepam; others ok  . Propofol Other (See Comments)    Cognitive delay and emotional    HOME MEDICATIONS: Outpatient Medications Prior to Visit  Medication Sig Dispense Refill  . amLODipine (NORVASC) 5 MG tablet TAKE 1 TABLET BY MOUTH EVERY DAY 90 tablet 2  . amphetamine-dextroamphetamine (ADDERALL) 30 MG tablet Take 1-2 tablets by mouth daily. 60 tablet 0  . Ascorbic Acid (VITAMIN C PO) Take 1 tablet by mouth 2 (two) times daily.    . B Complex-C (B-COMPLEX WITH VITAMIN C) tablet Take 1 tablet by mouth daily.    . clopidogrel (PLAVIX) 75 MG tablet TAKE 1 TABLET BY MOUTH EVERY DAY WITH BREAKFAST 90 tablet 2  . Coenzyme Q10 (COQ10) 100 MG CAPS Take 100 mg by mouth 2 (two) times daily.    . Evolocumab (REPATHA SURECLICK) [id number] MG/ML SOAJ Inject 140 mg into the skin every 14 (fourteen) days. 2 pen 11  . LORazepam (ATIVAN) 1 MG tablet Take 1 tablet (1 mg total) by mouth at bedtime. (Patient taking differently: Take 1 mg by mouth at bedtime. 0.5 tablets at night) 30 tablet 5  . Misc Natural Products (PROSTATE SUPPORT PO) Take  1 capsule by mouth every evening. Prostate Plus    . Omega-3 Fatty Acids (SUPER OMEGA 3 EPA/DHA PO) Take 1 capsule by mouth daily.    Marland Kitchen testosterone cypionate (DEPOTESTOSTERONE CYPIONATE) 200 MG/ML injection Inject 0.3 ML once a week 10 mL 2  . nitroGLYCERIN (NITROSTAT) 0.4 MG SL tablet Place 1 tablet (0.4 mg total) under the tongue every 5 (five) minutes as needed for chest pain. 25 tablet 3  . meloxicam (MOBIC) 15 MG tablet Take 0.5-1 tablets (7.5-15 mg total) by mouth daily as needed for pain. 30 tablet 6   No facility-administered medications prior to visit.    PAST MEDICAL HISTORY: Past Medical History:  Diagnosis Date  . ADD (attention deficit disorder)    on  adderrall managed by neurology  . Anxiety    on ativan managed by Neurology  . 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 CAD S/P percutaneous coronary angioplasty 06/2017   pLAD1 55% (not significant), p-mLAD2 80% & mLAD 70% (tandem lesions - DES PCI . Synergy DES 2.75 x 38 -- 3.0 mm).  mCx ~60% - med Rx, mRCA 30% & 40%.  ER 55-60%.  . Cellulitis   . Chronic traumatic encephalopathy    right frontal-temproal lobe  . Complication of anesthesia    " BRAIN FOG "  . Coronary artery calcification seen on CAT scan 05/12/2018   Triple- by CT - lung ca screen 04/2018  Cardio 06/2018: Coronary calcium score results shows aortic atherosclerosis (with mild dilation) as well as left main and three-vessel coronary artery calcification. The calcium score is 1168 which is very high risk amount of calcium.  Based on these findings, I would recommend proceeding with a coronary CT angiogram (a more detailed study with Intravenous X  . Depression   . Erectile dysfunction 11/2016   follows with urology; sildenafil prescribed  . Meningoencephalitis 1994   Equine  . Pneumonia   . Primary hypogonadism in male   . Raynaud's disease 123XX123   As complication of RMSF  . Restless legs 09/13/2015  . [place] spotted fever 2017  . TBI (traumatic brain injury) ([place]) 1997, 2009   "concussions"    PAST SURGICAL HISTORY: Past Surgical History:  Procedure Laterality Date  . CORONARY STENT INTERVENTION N/A 06/30/2018   Procedure: CORONARY STENT INTERVENTION;  Surgeon: [name], MD;  [facility]: [facility];;  p-mLAD2 80% & mLAD 70% (tandem lesions) - DES PCI Synergy DES 2.75 x 38 (3.0 mm).  . CT CTA CORONARY W/CA SCORE W/CM &/OR WO/CM  06/2018   Cardiac cath score read 1318.  Moderate coronary disease sent for FFR: mRCA 0.84 (non-significant), mLAD 0.64 (signifiant), pLOM(Cx) - 0,72 (significant - but distal lesion)   . LEFT HEART CATH AND CORONARY ANGIOGRAPHY N/A 06/30/2018   Procedure: LEFT HEART CATH AND CORONARY ANGIOGRAPHY;  Surgeon: [name], MD;  Location: [facility];;  pLAD1 55% (not significant), p-mLAD2 80% & mLAD 70% (tandem lesions -> DES PCI). mCx ~60% (med Rx), mRCA 30% & 40%.  ER 55-60%.   . TEE WITHOUT CARDIOVERSION N/A 09/06/2015   Procedure: TRANSESOPHAGEAL ECHOCARDIOGRAM (TEE);  Surgeon: Jerline Pain, MD;  R/O endocarditis;  . TONSILLECTOMY  1965    FAMILY HISTORY: Family History  Problem Relation Age of Onset  . Hyperlipidemia Father   . Heart disease Maternal Grandfather 75  . Cancer Paternal Grandfather   .  Mental illness Mother   . Mental illness Brother     SOCIAL HISTORY: Social History   Socioeconomic History  . Marital status: Divorced    Spouse name: [name]  . Number of children: 2  . Years of education: 38  . Highest education level: Not on file  Occupational History  . Occupation: Retired  Tobacco Use  . Smoking status: Former Smoker    Packs/day: 1.50    Years: 25.00    Pack years: 37.50    Types: Cigarettes    Quit date: 11/22/2014    Years since quitting: 4.8  . Smokeless tobacco: Never Used  . Tobacco comment: Encouraged to remain smoke free  Substance and Sexual Activity  . Alcohol use: No    Alcohol/week: 0.0 standard drinks  . Drug use: No  . Sexual activity: Yes    Partners: Female  Other Topics Concern  . Not on file  Social History Narrative   Divorced. B.A. degree. Retired.   Drinks caffeine, uses herbal remedies, takes a daily vitamin.   Wears his seatbelt.  Smoke detector in the home.   Firearms locked in the home.   Feels safe in relationships.    Social Determinants of Health   Financial Resource Strain:   . Difficulty of Paying Living Expenses:   Food Insecurity:   . Worried About Charity fundraiser in the Last Year:   . Arboriculturist in the Last Year:   Transportation Needs:   . Film/video editor  (Medical):   Marland Kitchen Lack of Transportation (Non-Medical):   Physical Activity:   . Days of Exercise per Week:   . Minutes of Exercise per Session:   Stress:   . Feeling of Stress :   Social Connections:   . Frequency of Communication with Friends and Family:   . Frequency of Social Gatherings with Friends and Family:   . Attends Religious Services:   . Active Member of Clubs or Organizations:   . Attends Archivist Meetings:   Marland Kitchen Marital Status:   Intimate Partner Violence:   . Fear of Current or Ex-Partner:   . Emotionally Abused:   Marland Kitchen Physically Abused:   . Sexually Abused:       PHYSICAL EXAM  Vitals:   09/26/19 0849  BP: 118/74  Pulse: 67  Weight: 177 lb (80.3 kg)  Height: 6\' 1"  (1.854 m)   Body mass index is 23.35 kg/m.  Generalized: Well developed, in no acute distress   Neurological examination  Mentation: Alert oriented to time, place, history taking. Follows all commands speech and language fluent Cranial nerve II-XII: Pupils were equal round reactive to light. Extraocular movements were full, visual field were full on confrontational test.  Head turning and shoulder shrug  were normal and symmetric. Motor: The motor testing reveals 5 over 5 strength of all 4 extremities. Good symmetric motor tone is noted throughout.  Sensory: Sensory testing is intact to soft touch on all 4 extremities. No evidence of extinction is noted.  Coordination: Cerebellar testing reveals good finger-nose-finger and heel-to-shin bilaterally.  Gait and station: Gait is normal Reflexes: Deep tendon reflexes are symmetric and normal bilaterally.   DIAGNOSTIC DATA (LABS, IMAGING, TESTING) - I reviewed patient records, labs, notes, testing and imaging myself where available.  Lab Results  Component Value Date   WBC 3.9 07/08/2019   HGB 14.3 07/08/2019   HCT 43.5 07/08/2019   MCV 96 07/08/2019   PLT 269 07/08/2019  Component Value Date/Time   NA 141 07/08/2019 0955   K  5.1 07/08/2019 0955   CL 104 07/08/2019 0955   CO2 24 07/08/2019 0955   GLUCOSE 91 07/08/2019 0955   GLUCOSE 108 (H) 06/08/2019 1819   BUN 12 07/08/2019 0955   CREATININE 0.91 07/08/2019 0955   CREATININE 0.71 01/03/2015 1339   CALCIUM 9.4 07/08/2019 0955   PROT 6.7 07/08/2019 0955   ALBUMIN 4.7 07/08/2019 0955   AST 24 07/08/2019 0955   ALT 16 07/08/2019 0955   ALKPHOS 81 07/08/2019 0955   BILITOT 0.6 07/08/2019 0955   GFRNONAA 91 07/08/2019 0955   GFRNONAA >89 04/12/2014 1722   GFRAA 105 07/08/2019 0955   GFRAA >89 04/12/2014 1722   Lab Results  Component Value Date   CHOL 199 07/08/2019   HDL 44 07/08/2019   LDLCALC 133 (H) 07/08/2019   TRIG 121 07/08/2019   CHOLHDL 4.5 07/08/2019   Lab Results  Component Value Date   HGBA1C 5.0 07/08/2019    Lab Results  Component Value Date   TSH 2.67 12/15/2017      ASSESSMENT AND PLAN 64 y.o. year old male  has a past medical history of ADD (attention deficit disorder), Anxiety, Atrophy, cortical (2017), Bartonella infection (2017), CAD S/P percutaneous coronary angioplasty (06/2017), Cellulitis, Chronic traumatic encephalopathy, Complication of anesthesia, Coronary artery calcification seen on CAT scan (05/12/2018), Depression, Erectile dysfunction (11/2016), Meningoencephalitis (1994), Pneumonia, Primary hypogonadism in male, Raynaud's disease (09/13/2015), Restless legs (09/13/2015), [facility] spotted fever (2017), and TBI (traumatic brain injury) ([place]) (1997, 2009). here with:  ADHD   Continue Adderall 60 mg daily  Insomnia   Decrease Ativan to 0.25 mg at bedtime  Advised if symptoms worsen or he develops new symptoms he should let us know.  Follow-up in 6 months or sooner if needed   I spent 20 minutes of face-to-face and non-face-to-face time with patient.  This included previsit chart review, lab review, study review, order entry, electronic health record documentation, patient education.  [name],  MSN, NP-C 09/26/2019, 8:52 AM [facility] [address] [place], [place] [address] [phone]

## 2019-09-27 ENCOUNTER — Ambulatory Visit: Payer: BC Managed Care – PPO | Admitting: Adult Health

## 2019-09-29 DIAGNOSIS — R413 Other amnesia: Secondary | ICD-10-CM | POA: Diagnosis not present

## 2019-09-29 DIAGNOSIS — R4182 Altered mental status, unspecified: Secondary | ICD-10-CM | POA: Diagnosis not present

## 2019-10-04 ENCOUNTER — Other Ambulatory Visit: Payer: Self-pay | Admitting: Adult Health

## 2019-10-04 NOTE — Telephone Encounter (Signed)
Pt has called for a refill on his amphetamine-dextroamphetamine (ADDERALL) 30 MG tablet [facility]  [id number]

## 2019-10-04 NOTE — Addendum Note (Signed)
Addended by: Brandon Melnick on: 10/04/2019 04:35 PM   Modules accepted: Orders

## 2019-10-05 MED ORDER — AMPHETAMINE-DEXTROAMPHETAMINE 30 MG PO TABS: 30.0000 mg | ORAL_TABLET | Freq: Every day | ORAL | 0 refills | Status: DC

## 2019-10-10 DIAGNOSIS — G930 Cerebral cysts: Secondary | ICD-10-CM | POA: Diagnosis not present

## 2019-10-10 DIAGNOSIS — R413 Other amnesia: Secondary | ICD-10-CM | POA: Diagnosis not present

## 2019-10-10 DIAGNOSIS — Z8673 Personal history of transient ischemic attack (TIA), and cerebral infarction without residual deficits: Secondary | ICD-10-CM | POA: Diagnosis not present

## 2019-10-12 DIAGNOSIS — F4321 Adjustment disorder with depressed mood: Secondary | ICD-10-CM | POA: Diagnosis not present

## 2019-10-19 ENCOUNTER — Encounter: Payer: Self-pay | Admitting: Adult Health

## 2019-10-19 DIAGNOSIS — F4321 Adjustment disorder with depressed mood: Secondary | ICD-10-CM | POA: Diagnosis not present

## 2019-10-19 NOTE — Telephone Encounter (Signed)
Are you ok to fill early.  I dont see why the pharmacy would not do this if you ok.

## 2019-10-19 NOTE — Telephone Encounter (Signed)
We can refill early. Have him call 6/21 for a refill

## 2019-10-20 ENCOUNTER — Other Ambulatory Visit: Payer: Self-pay | Admitting: Internal Medicine

## 2019-10-24 MED ORDER — AMPHETAMINE-DEXTROAMPHETAMINE 30 MG PO TABS: 30.0000 mg | ORAL_TABLET | Freq: Every day | ORAL | 0 refills | Status: DC

## 2019-10-24 NOTE — Telephone Encounter (Signed)
Ramey,Karen(friend on DPR) is calling to inform Sandy,RN that pt will be leaving out of town on Wed the 23rd re: the need for his amphetamine-dextroamphetamine (ADDERALL) 30 MG tablet

## 2019-10-24 NOTE — Telephone Encounter (Signed)
I called and spoke to [facility] [place].  I relayed that possible 2 prescription will come to them escribed.  The later, with drug registry note and ok to refill early is the lastest.  He will be out of town when needs his normal fill.  Ok to fill early. She did see this.

## 2019-10-24 NOTE — Addendum Note (Signed)
Addended by: Brandon Melnick on: 10/24/2019 03:51 PM   Modules accepted: Orders

## 2019-10-25 ENCOUNTER — Telehealth: Payer: Self-pay | Admitting: Adult Health

## 2019-10-25 ENCOUNTER — Other Ambulatory Visit: Payer: Self-pay | Admitting: Cardiology

## 2019-10-25 NOTE — Telephone Encounter (Signed)
Pt is needing RN to call pharmacy to inform them that it is ok to fill his amphetamine-dextroamphetamine (ADDERALL) 30 MG tablet earlier since he is going out of town. Pt knows it was sent in but was told that a call actually has to be made. Please advise.

## 2019-10-25 NOTE — Telephone Encounter (Signed)
I called and spoke to Phillip [name], pt picked up already, but I had spoken to [facility] yesterday to let her know about the early fill.

## 2019-11-08 DIAGNOSIS — R4182 Altered mental status, unspecified: Secondary | ICD-10-CM | POA: Diagnosis not present

## 2019-11-08 DIAGNOSIS — Z8673 Personal history of transient ischemic attack (TIA), and cerebral infarction without residual deficits: Secondary | ICD-10-CM | POA: Diagnosis not present

## 2019-11-08 DIAGNOSIS — G4700 Insomnia, unspecified: Secondary | ICD-10-CM | POA: Diagnosis not present

## 2019-11-08 DIAGNOSIS — Z8661 Personal history of infections of the central nervous system: Secondary | ICD-10-CM | POA: Diagnosis not present

## 2019-11-08 DIAGNOSIS — R413 Other amnesia: Secondary | ICD-10-CM | POA: Diagnosis not present

## 2019-11-10 ENCOUNTER — Encounter: Payer: Self-pay | Admitting: Adult Health

## 2019-11-16 ENCOUNTER — Ambulatory Visit: Payer: BC Managed Care – PPO | Admitting: Infectious Disease

## 2019-11-18 DIAGNOSIS — F4321 Adjustment disorder with depressed mood: Secondary | ICD-10-CM | POA: Diagnosis not present

## 2019-11-22 ENCOUNTER — Other Ambulatory Visit: Payer: Self-pay

## 2019-11-22 ENCOUNTER — Telehealth: Payer: BC Managed Care – PPO | Admitting: Family Medicine

## 2019-11-22 ENCOUNTER — Encounter: Payer: Self-pay | Admitting: Family Medicine

## 2019-11-22 ENCOUNTER — Ambulatory Visit (INDEPENDENT_AMBULATORY_CARE_PROVIDER_SITE_OTHER): Payer: BC Managed Care – PPO | Admitting: Family Medicine

## 2019-11-22 DIAGNOSIS — G4700 Insomnia, unspecified: Secondary | ICD-10-CM | POA: Diagnosis not present

## 2019-11-22 DIAGNOSIS — R4589 Other symptoms and signs involving emotional state: Secondary | ICD-10-CM

## 2019-11-22 MED ORDER — TRAZODONE HCL 50 MG PO TABS: 50.0000 mg | ORAL_TABLET | Freq: Every evening | ORAL | 3 refills | Status: DC | PRN

## 2019-11-22 NOTE — Progress Notes (Signed)
Office Visit Note   Patient: Phillip [name]           Date of Birth: 04-15-1956           MRN: [id number] Visit Date: 11/22/2019 Requested by: [name], DO [address] [place],  [place] [address] PCP: [name], DO  Subjective: Chief Complaint  Patient presents with  . discuss therapy notes    shoulder is better  . would like to change PCP to here    ? about Ativan & Adderall    HPI: He is here with a couple concerns.  He would like to establish primary care here.  He has struggled with insomnia for many years. He has been treated with Ativan for this, not for anxiety but purely for insomnia. He has decided that is probably not a good choice for his cognitive impairment and on his own he is weaned himself from 2 mg at bedtime down to 0.5 mg. He would like to try something different. He also thinks he has mildly depressed mood and is hoping that something will help him with that as well.  He takes Adderall chronically. He gets this filled by his neurologist.  He recently had a brain MRI scan and was told that it had small vessel changes. He is planning to bring his old MRI scan to the radiologist for comparison.  Incidentally, his right shoulder pain is virtually gone. He did very well with physical therapy.              ROS:   All other systems were reviewed and are negative.  Objective: Vital Signs: There were no vitals taken for this visit.  Physical Exam:  General:  Alert and oriented, in no acute distress. Pulm:  Breathing unlabored. Psy:  Normal mood, congruent affect.  No other exam done.  Imaging: No results found.  Assessment & Plan: 1. Chronic insomnia with depressed mood -We will try trazodone. Hopefully this will help with his depressed mood as well. If it helps with insomnia but not with his mood, we can try an SSRI.     Procedures: No procedures performed  No notes on file     PMFS History: Patient Active Problem List   Diagnosis  Date Noted  . Mild emphysema ([place]) 05/27/2019  . Essential hypertension 08/11/2018  . Statin intolerance 08/11/2018  . Coronary artery disease involving native coronary artery of native heart without angina pectoris 07/18/2018  . CAD S/P percutaneous coronary angioplasty 06/30/2018  . Abnormal computed tomography angiography (CTA)   . Hyperlipidemia with target LDL less than 70 05/22/2018  . 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 Chronic prescription benzodiazepine use 09/14/2017  . Chronic insomnia 09/14/2017  . Accidental testosterone overdose 03/16/2017  . Erectile dysfunction 12/09/2016  . 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 Encephalopathy, traumatic 10/08/2015  . 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 Raynaud's disease 10/08/2015  . 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 Anxiety   . Cognitive impairment 01/31/2013  . 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 ADD (attention deficit disorder) 03/10/2012   Past Medical History:  Diagnosis Date  . ADD (attention deficit disorder)    on adderrall managed by neurology  . Anxiety    on ativan managed by Neurology  . 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 CAD S/P percutaneous coronary angioplasty 06/2017   pLAD1 55% (not significant), p-mLAD2 80% &  mLAD 70% (tandem lesions - DES PCI . Synergy DES 2.75 x 38 -- 3.0 mm).  mCx ~60% - med Rx, mRCA 30% & 40%.  ER 55-60%.  . Cellulitis   . Chronic traumatic encephalopathy    right frontal-temproal lobe  . Complication of anesthesia    " BRAIN FOG "  . Coronary artery calcification seen on CAT scan 05/12/2018   Triple- by CT - lung ca screen 04/2018  Cardio 06/2018: Coronary calcium score results shows aortic atherosclerosis (with mild dilation) as well as left main and three-vessel coronary artery calcification. The calcium score is 1168 which is very high risk amount of calcium.  Based on  these findings, I would recommend proceeding with a coronary CT angiogram (a more detailed study with Intravenous X  . Depression   . Erectile dysfunction 11/2016   follows with urology; sildenafil prescribed  . Meningoencephalitis 1994   [place]  . Pneumonia   . Primary hypogonadism in male   . Raynaud's disease 86/76/1950   As complication of RMSF  . Restless legs 09/13/2015  . [place] spotted fever 2017  . TBI (traumatic brain injury) ([place]) 1997, 2009   "concussions"    Family History  Problem Relation Age of Onset  . Hyperlipidemia Father   . Heart disease Maternal Grandfather 75  . Cancer Paternal Grandfather   . Mental illness Mother   . Mental illness Brother     Past Surgical History:  Procedure Laterality Date  . CORONARY STENT INTERVENTION N/A 06/30/2018   Procedure: CORONARY STENT INTERVENTION;  Surgeon: [name], MD;  [facility];;  p-mLAD2 80% & mLAD 70% (tandem lesions) - DES PCI Synergy DES 2.75 x 38 (3.0 mm).  . CT CTA CORONARY W/CA SCORE W/CM &/OR WO/CM  06/2018   Cardiac cath score read 1318.  Moderate coronary disease sent for FFR: mRCA 0.84 (non-significant), mLAD 0.64 (signifiant), pLOM(Cx) - 0,72 (significant - but distal lesion)  . LEFT HEART CATH AND CORONARY ANGIOGRAPHY N/A 06/30/2018   Procedure: LEFT HEART CATH AND CORONARY ANGIOGRAPHY;  Surgeon: [name], MD;  [facility];;  pLAD1 55% (not significant), p-mLAD2 80% & mLAD 70% (tandem lesions -> DES PCI). mCx ~60% (med Rx), mRCA 30% & 40%.  ER 55-60%.   . TEE WITHOUT CARDIOVERSION N/A 09/06/2015   Procedure: TRANSESOPHAGEAL ECHOCARDIOGRAM (TEE);  Surgeon: Jerline Pain, MD;  R/O endocarditis;  . TONSILLECTOMY  1965   Social History   Occupational History  . Occupation: Retired  Tobacco Use  . Smoking status: Former Smoker    Packs/day: 1.50    Years: 25.00    Pack years: 37.50    Types: Cigarettes    Quit date: 11/22/2014    Years since  quitting: 5.0  . Smokeless tobacco: Never Used  . Tobacco comment: Encouraged to remain smoke free  Vaping Use  . Vaping Use: Never used  Substance and Sexual Activity  . Alcohol use: No    Alcohol/week: 0.0 standard drinks  . Drug use: No  . Sexual activity: Yes    Partners: Female

## 2019-11-23 ENCOUNTER — Encounter: Payer: Self-pay | Admitting: Family Medicine

## 2019-11-24 ENCOUNTER — Institutional Professional Consult (permissible substitution): Payer: BC Managed Care – PPO | Admitting: Internal Medicine

## 2019-11-25 ENCOUNTER — Ambulatory Visit: Payer: BC Managed Care – PPO | Admitting: Internal Medicine

## 2019-11-25 ENCOUNTER — Encounter: Payer: Self-pay | Admitting: Internal Medicine

## 2019-11-25 ENCOUNTER — Other Ambulatory Visit: Payer: Self-pay

## 2019-11-25 VITALS — BP 128/70 | HR 86 | Ht 73.0 in | Wt 182.0 lb

## 2019-11-25 DIAGNOSIS — E230 Hypopituitarism: Secondary | ICD-10-CM

## 2019-11-25 DIAGNOSIS — N529 Male erectile dysfunction, unspecified: Secondary | ICD-10-CM | POA: Diagnosis not present

## 2019-11-25 NOTE — Patient Instructions (Addendum)
Please continue 0.3 ml/week Testosterone.  Please come back for labs fasting, in am (8-9 am), halfway between injections.  Please come back for a follow-up appointment in 1 year.

## 2019-11-25 NOTE — Progress Notes (Signed)
Patient ID: Phillip [name], male   DOB: 1956/05/03, 64 y.o.   MRN: [id number]   This visit occurred during the SARS-CoV-2 public health emergency.  Safety protocols were in place, including screening questions prior to the visit, additional usage of staff PPE, and extensive cleaning of exam room while observing appropriate contact time as indicated for disinfecting solutions.   HPI: Phillip [name] is a 64 y.o.-year-old man, initially referred by his neurologist, Dr. [name], presenting for follow-up for hypogonadism. Last visit 1 year ago (virtual).  Patient has a history of a coronary stent placed in 06/2018 - after he had an elevated CAC score, after which he had to come off testosterone transiently.  With cardiology consent, we restarted testosterone supplement at 0.3 mL weekly as he felt poorly off testosterone.  We did try to reduce the dose further to 0.2 mL weekly, but he did not tolerate this well due to fatigue.  At this visit, he continues on 0.3 mL testosterone weekly.  He has ED, but libido is fair.  He is on PDE 5 inhibitors prescribed by Dr. Diona Fanti -urology.  Since last visit, he started Adderall.  He has insomnia, memory loss, AMS.  He was in the emergency room 06/2019 atypical chest pain.  He was started on Repatha 07/2019.  Since last visit, he started the [facility] in 06/2019 - diet, exercise, stress mngm.  He joined the program in [place], initially commuting there, but now he does not need to go there often.  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  He was given refills of generic sildenafil. -Was advised to continue 0.3 cc of testosterone cypionate weekly (200 mg / 1 mL) -On genital exam, he had mildly atrophic right and left testicles and a prostate of approximately 30 g,  without abnormalities  Reviewed history: In 08/2015, patient describes that he developed hot flushes, SOB, fatigue, nail hemorrhages >>  he was found to have RMSF. He had a subsequent visit with his PCP in 09/2015 >> a testosterone level was found to be extremely low.   Received records from urology >> reviewed: Patient's initial free testosterone level obtained by PCP was 0.4 pg/mL (09/17/2015). At that time, he complained of low libido, testicular atrophy, difficulty obtaining and maintaining an erection. A repeat testosterone level obtained by an integrative medicine provider in [place] (09/25/2015) showed again very low testosterone level: Total Testosterone <3 ng/dL, and the free testosterone of 0.2 ng/dL. DHEAS was 122.4 (48.9-344.2). Of note, no LH and FSH levels were drawn at that time. At that point, he was started on testosterone cypionate 8 IM 0.8 mL every other week. This has not improved his libido significantly, but it helped him obtaining morning erections.  A genital exam was performed on 11/09/2015 by his urologist: Atrophic left and right testes. No tenderness, swelling, masses, varicocele, or hydrocele. No abnormality of the penis. Circumcised status. Prostate: 2+ in size, 40 g. Normal consistency  Dr. Diona Fanti obtain the following labs - 11/09/2015 - collection time: 3:34 PM  Total testosterone 1125.7 (300-890) Free testosterone 175.3 (47-244) SHBG 66.3 (10-57)  FSH 0.1, LH 0.1 Prolactin 6.3 Of note, at the time of the above collection, patient was taking 0.8 mL testosterone every other week in his deltoid. The dose was changed at that time to 0.5 mL every week in his quadriceps, after which his fatigue improved a little, and he had more energy.   Patient has a  history of cognitive impairment after a meningoencephalitis episode in 1994. He saw Dr. [name], who obtained a  brain  + pituitary MRI (10/24/2015). 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 There was no  abnormality in the pituitary gland.  He admits for previous decreased libido >> normal now Had difficulty obtaining or maintaining an erection >> not anymore No trauma to testes, testicular irradiation or surgery No h/o of mumps orchitis/h/o autoimmune ds. No h/o cryptorchidism He grew and went through puberty like his peers + shrinking of testes. No very small testes (<5 ml) No incomplete/delayed sexual development     No breast discomfort/gynecomastia    No loss of body hair (axillary/pubic)/decreased need for shaving No height loss No abnormal sense of smell  + hot flushes, resolved No vision problems, other than blurry vision in R eye, also photopsia - started 11/2015 No worst HA of his life He does have a history of head trauma in 1997, when a tree fell on top of his head and he lost consciousness for more than 30 minutes; in 2009 he fell off ladder and landed on back of his head No FH of hypogonadism/infertility No personal h/o infertility - has 2 children: 84 and 58 years old  No FH of hemochromatosis or pituitary tumors No excessive weight gain or loss.  No chronic diseases, but recently diagnosed with RMSF Spring 2017 No chronic pain. Not on opiates, does not take steroids, had a course this spring - ~1 week He was drinking Alcohol: ~2 drinks a day up to 2015 >>  stopped completely since then No anabolic steroids use No herbal medicines. Not on antidepressants  No AI ds in his family, no FH of MS. He does not have family history of early cardiac disease.  Reviewed pertinent labs: Latest labs showing normal free testosterone and his free estradiol level was only slightly above the upper limit of normal Component     Latest Ref Rng & Units 10/25/2018  Testosterone, Serum (Total)     ng/dL 813  % Free Testosterone     % 1.3  Free Testosterone, S     pg/mL 106  Sex Hormone Binding Globulin     nmol/L 59.2  Estradiol, Free     pg/mL 0.46 (H)  Estradiol     ADULTS: < OR  = 0.45 pg/mL 22  PSA     0.10 - 4.00 ng/mL 0.51   Component     Latest Ref Rng & Units 07/20/2017 10/12/2017 12/22/2017  Testosterone, Serum (Total)     ng/dL  718   % Free Testosterone     %  0.8   Free Testosterone, S     pg/mL  57   Sex Hormone Binding Globulin     nmol/L  83.5 (H)   Testosterone     264 - 916 ng/dL 350    Testosterone Free     7.2 - 24.0 pg/mL 4.1 (L)    Sex Horm Binding Glob, Serum     19.3 - 76.4 nmol/L 100.7 (H)    Estradiol, Free     pg/mL   0.38  Estradiol     pg/mL   19   Initially: Component     Latest Ref Rng & Units 01/28/2016  Testosterone     264 - 916 ng/dL 1,103 (H)  Testosterone Free     7.2 - 24.0 pg/mL  15.1  Sex Horm Binding Glob, Serum     19.3 - 76.4 nmol/L 80.8 (H)  TSH     0.35 - 4.50 uIU/mL 1.90  Triiodothyronine,Free,Serum     2.3 - 4.2 pg/mL 3.9  T4,Free(Direct)     0.60 - 1.60 ng/dL 0.80  FSH     1.4 - 18.1 mIU/ML 0.1 (L)  Cortisol, Plasma     ug/dL 13.1  LH     1.50 - 9.30 mIU/mL 0.03 (L)  hCG Quant     0 - 3 mIU/mL <1   Component     Latest Ref Rng & Units 01/28/2016  IGF-I, LC/MS     50 - 317 ng/mL 140  Z-Score (Male)     -2.0 - 2.0 SD 0.1  Estradiol, Free     ADULTS: < OR = 0.45 pg/mL 1.06 (H)  Estradiol     ADULTS: < OR = 29 pg/mL 53 (H)  Prolactin     2.0 - 18.0 ng/mL 8.1   His PSA level was normal: Lab Results  Component Value Date   PSA 0.51 10/25/2018   PSA 0.80 12/22/2017   PSA 0.59 12/19/2016   PSA 0.72 12/10/2016   PSA 0.57 04/12/2014   PSA 0.57 03/16/2013   His hematocrit was normal at last check: Lab Results  Component Value Date   HCT 43.5 07/08/2019   ROS:   Constitutional: no weight gain/no weight loss, no fatigue, no subjective hyperthermia, no subjective hypothermia Eyes: no blurry vision, no xerophthalmia ENT: no sore throat, no nodules palpated in neck, no dysphagia, no odynophagia, no hoarseness Cardiovascular: no CP/no SOB/no palpitations/no leg swelling Respiratory: no  cough/no SOB/no wheezing Gastrointestinal: no N/no V/no D/no C/no acid reflux Musculoskeletal: no muscle aches/no joint aches Skin: no rashes, no hair loss Neurological: no tremors/no numbness/no tingling/no dizziness  I reviewed pt's medications, allergies, PMH, social hx, family hx, and changes were documented in the history of present illness. Otherwise, unchanged from my initial visit note.  Past Medical History:  Diagnosis Date  . ADD (attention deficit disorder)    on adderrall managed by neurology  . Anxiety    on ativan managed by Neurology  . 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 CAD S/P percutaneous coronary angioplasty 06/2017   pLAD1 55% (not significant), p-mLAD2 80% & mLAD 70% (tandem lesions - DES PCI . Synergy DES 2.75 x 38 -- 3.0 mm).  mCx ~60% - med Rx, mRCA 30% & 40%.  ER 55-60%.  . Cellulitis   . Chronic traumatic encephalopathy    right frontal-temproal lobe  . Complication of anesthesia    " BRAIN FOG "  . Coronary artery calcification seen on CAT scan 05/12/2018   Triple- by CT - lung ca screen 04/2018  Cardio 06/2018: Coronary calcium score results shows aortic atherosclerosis (with mild dilation) as well as left main and three-vessel coronary artery calcification. The calcium score is 1168 which is very high risk amount of calcium.  Based on these findings, I would recommend proceeding with a coronary CT angiogram (a more detailed study with Intravenous X  . Depression   . Erectile dysfunction 11/2016   follows with urology; sildenafil prescribed  . Meningoencephalitis 1994   [place]  . Pneumonia   . Primary hypogonadism in male   . Raynaud's disease 17/40/8144   As complication of RMSF  . Restless legs 09/13/2015  . [facility]  [place] fever 2017  . TBI (traumatic brain injury) ([place]) 1997, 2009   "concussions"   Past Surgical History:  Procedure Laterality Date  .  CORONARY STENT INTERVENTION N/A 06/30/2018   Procedure: CORONARY STENT INTERVENTION;  Surgeon: [name], MD;  [facility];;  p-mLAD2 80% & mLAD 70% (tandem lesions) - DES PCI Synergy DES 2.75 x 38 (3.0 mm).  . CT CTA CORONARY W/CA SCORE W/CM &/OR WO/CM  06/2018   Cardiac cath score read 1318.  Moderate coronary disease sent for FFR: mRCA 0.84 (non-significant), mLAD 0.64 (signifiant), pLOM(Cx) - 0,72 (significant - but distal lesion)  . LEFT HEART CATH AND CORONARY ANGIOGRAPHY N/A 06/30/2018   Procedure: LEFT HEART CATH AND CORONARY ANGIOGRAPHY;  Surgeon: [name], MD;  [facility];;  pLAD1 55% (not significant), p-mLAD2 80% & mLAD 70% (tandem lesions -> DES PCI). mCx ~60% (med Rx), mRCA 30% & 40%.  ER 55-60%.   . TEE WITHOUT CARDIOVERSION N/A 09/06/2015   Procedure: TRANSESOPHAGEAL ECHOCARDIOGRAM (TEE);  Surgeon: Jerline Pain, MD;  R/O endocarditis;  . TONSILLECTOMY  1965   Social History   Social History  . Marital status: Single    Spouse name: N/A  . Number of children: 2   Occupational History  . n/a   Social History Main Topics  . Smoking status: Former Smoker    Packs/day: 1.50    Years: 25.00    Types: Cigarettes    Quit date: 2002  . Smokeless tobacco: Never Used     Comment: Encouraged to remain smoke free  . Alcohol use No     Comment: 8 drinks  . Drug use: No   Social History Narrative   Divorced. Education: [employer].   Current Outpatient Medications on File Prior to Visit  Medication Sig Dispense Refill  . amLODipine (NORVASC) 5 MG tablet TAKE 1 TABLET BY MOUTH EVERY DAY 90 tablet 2  . amphetamine-dextroamphetamine (ADDERALL) 30 MG tablet Take 1-2 tablets by mouth daily. 60 tablet 0  . Ascorbic Acid (VITAMIN C PO) Take 1 tablet by mouth 2 (two) times daily.    . B Complex-C (B-COMPLEX WITH VITAMIN C) tablet Take 1 tablet by mouth daily.    . Cholecalciferol 125 MCG (5000 UT) TABS     . clopidogrel (PLAVIX) 75 MG  tablet TAKE 1 TABLET BY MOUTH EVERY DAY WITH BREAKFAST 90 tablet 2  . Coenzyme Q10 (COQ10) 100 MG CAPS Take 100 mg by mouth 2 (two) times daily.    . Evolocumab (REPATHA SURECLICK) 229 MG/ML SOAJ Inject 140 mg into the skin every 14 (fourteen) days. 2 pen 11  . LORazepam (ATIVAN) 0.5 MG tablet Take 0.5 tablets (0.25 mg total) by mouth at bedtime. 0.5 tablets at night 30 tablet 1  . Menaquinone-7 (VITAMIN K2) 100 MCG CAPS     . Misc Natural Products (PROSTATE SUPPORT PO) Take 1 capsule by mouth every evening. Prostate Plus    . nitroGLYCERIN (NITROSTAT) 0.4 MG SL tablet PLACE 1 TABLET UNDER TONGUE EVERY 5 MINS AS NEEDED FOR CHEST PAIN 25 tablet 6  . Omega-3 Fatty Acids (SUPER OMEGA 3 EPA/DHA PO) Take 1 capsule by mouth daily.    . Probiotic Product ([employer] PROBIOTIC) CAPS     . testosterone cypionate (DEPOTESTOSTERONE CYPIONATE) 200 MG/ML injection Inject 0.3 ML once a week 10 mL 2  . traZODone (DESYREL) 50 MG tablet Take 1-3 tablets (50-150 mg total) by mouth at bedtime as needed for  sleep. 90 tablet 3  . Zinc Acetate (GALZIN) 25 MG CAPS Take by mouth.     No current facility-administered medications on file prior to visit.   Allergies  Allergen Reactions  . Penicillins Hives    Did it involve swelling of the face/tongue/throat, SOB, or low BP? Unknown Did it involve sudden or severe rash/hives, skin peeling, or any reaction on the inside of your mouth or nose? Yes Did you need to seek medical attention at a hospital or doctor's office? Unknown When did it last happen?Occurred at age 47 or 41 If all above answers are "NO", may proceed with cephalosporin use.   . Statins Other (See Comments)    confusion  . Methylprednisolone Other (See Comments)    Unsure of exact reaction type  . Amphetamines Other (See Comments)    Intolerant to specific formulations: [employer] amphetamine TEVA dextroamp amphetamine  . Lorazepam Other (See Comments)    Mylan lorazepam; others ok  . Propofol Other  (See Comments)    Cognitive delay and emotional   Family History  Problem Relation Age of Onset  . Hyperlipidemia Father   . Heart disease Maternal Grandfather 75  . Cancer Paternal Grandfather   . Mental illness Mother   . Mental illness Brother    PE: BP 128/70   Pulse 86   Ht 6\' 1"  (1.854 m)   Wt 182 lb (82.6 kg)   SpO2 98%   BMI 24.01 kg/m  Wt Readings from Last 3 Encounters:  11/25/19 182 lb (82.6 kg)  09/26/19 177 lb (80.3 kg)  08/01/19 181 lb 6.4 oz (82.3 kg)   Constitutional: normal weight, in NAD Eyes: PERRLA, EOMI, no exophthalmos ENT: moist mucous membranes, no thyromegaly, no cervical lymphadenopathy Cardiovascular: RRR, No MRG Respiratory: CTA B Gastrointestinal: abdomen soft, NT, ND, BS+ Musculoskeletal: no deformities, strength intact in all 4 Skin: moist, warm, no rashes Neurological: no tremor with outstretched hands, DTR normal in all 4  ASSESSMENT: 1.  Hypogonadotropic hypogonadism - possible reasons for his very low initial testosterone: 1. Labs were drawn soon after diagnosis of RMSF, which, for him, was a fulminant, systemic, disease. Usually, the pituitary gland will shunt the production of testosterone or other hormones deemed "unnecessary" during acute illness. Testosterone levels could be quite low in this situation. 2. Labs were drawn also after a prednisone course, but I do not have a clear time sequence 3. He has a history of head trauma x2, which could have caused pituitary insufficiency 4. He has a history of alcohol use, however, he relates that he stopped in 2015  -Reviewed visit note from Dr. 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  He was given refills of generic sildenafil. -Was advised to continue 0.3 cc of testosterone cypionate weekly (200 mg / 1 mL) -On genital exam, he had mildly atrophic right and left testicles and a prostate of approximately 30 g,  without  abnormalities  2. ED  PLAN:  1. Hypogonadism -Patient was diagnosed with hypogonadotropic hypogonadism approximately 4 years ago, during an episode of RMSF, during which his testosterone was checked and it was in the castrate level.  He was started on testosterone afterwards and referred to me for further investigation.  We checked his pituitary status and his hormone levels along with a pituitary MRI were normal.  His LH and FSH were low, as expected, on testosterone treatment.  His hypothalamic-pituitary-testicular axis improved afterwards, but did not  recover completely so he continues on testosterone replacement.  He is currently on 0.3 mL weekly.  We tried to decrease the dose to 0.2 mL weekly but he could not tolerate it due to increased fatigue.  However, now that he started on a plant-based diet along with exercise and stress management, we discussed that we may be able to decrease his testosterone further.  He is open to this idea.  He will have another meeting with one of the doctors in the program soon and he will let me know afterwards if he is ready to try to decrease the dose to 0.2 mg weekly. -He also has a history of slightly elevated estrogen in the past, most likely due to testosterone treatment.  At last check testosterone was very slightly above the upper limit of normal and we discussed about not checking this anymore.  He agrees. -I reviewed his recent ED records from 06/2019 when he presented with chest pain and was ruled out for MI.  She was started on a PCSK9 inhibitor.  Of note, he had a coronary stent placed 06/2018.  He was originally off testosterone afterwards but, with cardiology (Dr. Ellyn Hack) consent, we restarted testosterone 08/2018.  He started to feel much better on this.  No breast tension, headaches, worsening hypertension. -At today's visit, we will continue to 0.3 mL testosterone weekly for now and plan to check his testosterone and PSA when he returns fasting.  He  had a recent CBC which was normal. -I will see him back in a year but will stay in touch in the meantime  2. ED -This has vascular origin, per notes from Dr. Diona Fanti, his urologist -He is on PDE 5 inhibitor per urology.  He tolerates this well.  Orders Placed This Encounter  Procedures  . Testosterone Free with SHBG  . PSA   [name], MD PhD [facility]

## 2019-11-29 ENCOUNTER — Other Ambulatory Visit: Payer: Self-pay | Admitting: Adult Health

## 2019-11-29 MED ORDER — AMPHETAMINE-DEXTROAMPHETAMINE 30 MG PO TABS: 30.0000 mg | ORAL_TABLET | Freq: Every day | ORAL | 0 refills | Status: DC

## 2019-11-29 NOTE — Addendum Note (Signed)
Addended by: Elita Quick on: 11/29/2019 03:07 PM   Modules accepted: Orders

## 2019-11-29 NOTE — Telephone Encounter (Signed)
[name],Phillip has called for a refill for pt's amphetamine-dextroamphetamine (ADDERALL) 30 MG tablet to  [id number]

## 2019-12-07 DIAGNOSIS — F4321 Adjustment disorder with depressed mood: Secondary | ICD-10-CM | POA: Diagnosis not present

## 2019-12-07 MED ORDER — SERTRALINE HCL 25 MG PO TABS
25.0000 mg | ORAL_TABLET | Freq: Every day | ORAL | 6 refills | Status: DC
Start: 2019-12-07 — End: 2019-12-29

## 2019-12-14 ENCOUNTER — Ambulatory Visit: Payer: BC Managed Care – PPO | Admitting: Cardiology

## 2019-12-14 DIAGNOSIS — F4321 Adjustment disorder with depressed mood: Secondary | ICD-10-CM | POA: Diagnosis not present

## 2019-12-20 ENCOUNTER — Ambulatory Visit: Payer: BC Managed Care – PPO | Admitting: Emergency Medicine

## 2019-12-20 ENCOUNTER — Other Ambulatory Visit: Payer: Self-pay

## 2019-12-20 ENCOUNTER — Encounter: Payer: Self-pay | Admitting: Emergency Medicine

## 2019-12-20 DIAGNOSIS — Z87891 Personal history of nicotine dependence: Secondary | ICD-10-CM | POA: Diagnosis not present

## 2019-12-20 DIAGNOSIS — J439 Emphysema, unspecified: Secondary | ICD-10-CM | POA: Diagnosis not present

## 2019-12-20 NOTE — Patient Instructions (Signed)
We will perform pulmonary function testing to establish baseline lung function. Follow with Dr. Lamonte Sakai next available after your PFT so that we can review together. Keep up the good work with your diet and exercise. Since you stop smoking over 15 years ago you probably do not need to have anymore lung cancer screening CT scans.  Your most recent scan in January 2021 was clear.

## 2019-12-20 NOTE — Assessment & Plan Note (Signed)
Noted on his lung cancer screening CT.  He is completely asymptomatic, suspect that he has COPD stage 0.  This is a good time to get baseline pulmonary function testing and assess for any obstructive disease.  I do not think needs bronchodilator therapy at this time.

## 2019-12-20 NOTE — Assessment & Plan Note (Signed)
He has participated in the lung cancer screening program, most recently had a RADS 1 scan in January 2021.  On questioning today it sounds like he actually quit 18 or 19 years ago.  Based on that he should not need any more screening CTs.

## 2019-12-20 NOTE — Progress Notes (Signed)
Subjective:    Patient ID: Phillip [name], male    DOB: May 17, 1955, 64 y.o.   MRN: [id number]  HPI 64 year old former smoker (38 pack years, quit ~ 2003) with a history of ADD, anxiety, chronic traumatic encephalopathy, CAD with PTCI 06/2018, history of RMSF.  He is referred here today for evaluation of mild shortness of breath and some emphysema seen on his CT chest. He has noticed a bit heavier breathing during exercise. He is still able to exercise, exert as much as he wants. No wheeze, no cough, no CP He participates in lung cancer screening program, last scan was 05/09/2019 which I reviewed, was a RADS 1 study. This did show some emphysematous change.  Chest x-ray 06/08/2019 was normal, reviewed by me.   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 Synergy DES 2.75 x 38 -- 3.0 mm).  mCx ~60% - med Rx, mRCA 30% & 40%.  ER 55-60%.   Cellulitis    Chronic traumatic encephalopathy    right frontal-temproal lobe   Complication of anesthesia    " BRAIN FOG "   Coronary artery calcification seen on CAT scan 05/12/2018   Triple- by CT - lung ca screen 04/2018  Cardio 06/2018: Coronary calcium score results shows aortic atherosclerosis (with mild dilation) as well as left main and three-vessel coronary artery calcification. The calcium score is 1168 which is very high risk amount of calcium.  Based on these findings, I would recommend proceeding with a coronary CT angiogram (a more detailed study with Intravenous X   Depression    Erectile dysfunction 11/2016   follows with urology; sildenafil  prescribed   Meningoencephalitis 1994   [place]   Pneumonia    Primary hypogonadism in male    Raynaud's disease 53/66/4403   As complication of RMSF   Restless legs 09/13/2015   [facility] spotted fever 2017   TBI (traumatic brain injury) ([employer]) 1997, 2009   "concussions"     Family History  Problem Relation Age of Onset   Hyperlipidemia Father    Heart disease Maternal Grandfather 37   Cancer Paternal Grandfather    Mental illness Mother    Mental illness Brother      Social History   Socioeconomic History   Marital status: Divorced    Spouse name: [name]   Number of children: 2   Years of education: 16   Highest education level: Not on file  Occupational History   Occupation: Retired  Tobacco Use   Smoking status: Former Smoker    Packs/day: 1.50    Years: 25.00    Pack years: 37.50    Types: Cigarettes    Quit date: 11/22/2014    Years since quitting: 5.0   Smokeless tobacco: Never Used   Tobacco comment: Encouraged to remain smoke free  Vaping Use   Vaping Use: Never used  Substance and Sexual Activity   Alcohol use: No    Alcohol/week: 0.0 standard drinks   Drug use: No   Sexual activity: Yes  Partners: Female  Other Topics Concern   Not on file  Social History Narrative   Divorced. B.A. degree. Retired.   Drinks caffeine, uses herbal remedies, takes a daily vitamin.   Wears his seatbelt.  Smoke detector in the home.   Firearms locked in the home.   Feels safe in relationships.    Social Determinants of Health   Financial Resource Strain:    Difficulty of Paying Living Expenses:   Food Insecurity:    Worried About Charity fundraiser in the Last Year:    Arboriculturist in the Last Year:   Transportation Needs:    Film/video editor (Medical):    Lack of Transportation (Non-Medical):   Physical Activity:    Days of Exercise per Week:    Minutes of Exercise per Session:   Stress:    Feeling of Stress  :   Social Connections:    Frequency of Communication with Friends and Family:    Frequency of Social Gatherings with Friends and Family:    Attends Religious Services:    Active Member of Clubs or Organizations:    Attends Music therapist:    Marital Status:   Intimate Partner Violence:    Fear of Current or Ex-Partner:    Emotionally Abused:    Physically Abused:    Sexually Abused:     Some outdoor work, mainly worked Scientist, research (life sciences) estate   Allergies  Allergen [facility]    Did it involve swelling of the face/tongue/throat, SOB, or low BP? Unknown Did it involve sudden or severe rash/hives, skin peeling, or any reaction on the inside of your mouth or nose? Yes Did you need to seek medical attention at a hospital or doctor's office? Unknown When did it last happen?Occurred at age 3 or 17 If all above answers are NO, may proceed with cephalosporin use.    Statins Other (See Comments)    confusion   Methylprednisolone Other (See Comments)    Unsure of exact reaction type   Amphetamines Other (See Comments)    Intolerant to specific formulations: [employer] amphetamine TEVA dextroamp amphetamine   Lorazepam Other (See Comments)    Mylan lorazepam; others ok   Propofol Other (See Comments)    Cognitive delay and emotional     Outpatient Medications Prior to Visit  Medication Sig Dispense Refill   amLODipine (NORVASC) 5 MG tablet TAKE 1 TABLET BY MOUTH EVERY DAY 90 tablet 2   amphetamine-dextroamphetamine (ADDERALL) 30 MG tablet Take 1-2 tablets by mouth daily. 60 tablet 0   Ascorbic Acid (VITAMIN C PO) Take 1 tablet by mouth 2 (two) times daily.     B Complex-C (B-COMPLEX WITH VITAMIN C) tablet Take 1 tablet by mouth daily.     Cholecalciferol 125 MCG (5000 UT) TABS      clopidogrel (PLAVIX) 75 MG tablet TAKE 1 TABLET BY MOUTH EVERY DAY WITH BREAKFAST 90 tablet 2   Coenzyme Q10 (COQ10) 100 MG CAPS Take 100 mg by mouth 2  (two) times daily.     Evolocumab (REPATHA SURECLICK) 540 MG/ML SOAJ Inject 140 mg into the skin every 14 (fourteen) days. 2 pen 11   Menaquinone-7 (VITAMIN K2) 100 MCG CAPS      Misc Natural Products (PROSTATE SUPPORT PO) Take 1 capsule by mouth every evening. Prostate Plus     nitroGLYCERIN (NITROSTAT) 0.4 MG SL tablet PLACE 1 TABLET UNDER TONGUE EVERY 5 MINS AS NEEDED FOR CHEST PAIN 25  tablet 6   Omega-3 Fatty Acids (SUPER OMEGA 3 EPA/DHA PO) Take 1 capsule by mouth daily.     Probiotic Product ([facility] PROBIOTIC) CAPS      sertraline (ZOLOFT) 25 MG tablet Take 1 tablet (25 mg total) by mouth daily. 30 tablet 6   testosterone cypionate (DEPOTESTOSTERONE CYPIONATE) 200 MG/ML injection Inject 0.3 ML once a week 10 mL 2   Zinc Acetate (GALZIN) 25 MG CAPS Take by mouth.     No facility-administered medications prior to visit.        Objective:   Physical Exam Vitals:   12/20/19 1417  BP: 130/72  Pulse: 68  Temp: 98.3 F (36.8 C)  TempSrc: Oral  SpO2: 98%  Weight: 179 lb 9.6 oz (81.5 kg)  Height: 6\' 1"  (1.854 m)  Gen: Pleasant, well-nourished, in no distress,  normal affect  ENT: No lesions,  mouth clear,  oropharynx clear, no postnasal drip  Neck: No JVD, no stridor  Lungs: No use of accessory muscles, no crackles or wheezing on normal respiration, no wheeze on forced expiration  Cardiovascular: RRR, heart sounds normal, no murmur or gallops, no peripheral edema  Musculoskeletal: No deformities, no cyanosis or clubbing  Neuro: alert, awake, non focal  Skin: Warm, no lesions or rash      Assessment & Plan:  Mild emphysema (HCC) Noted on his lung cancer screening CT.  He is completely asymptomatic, suspect that he has COPD stage 0.  This is a good time to get baseline pulmonary function testing and assess for any obstructive disease.  I do not think needs bronchodilator therapy at this time.  Personal history of tobacco use He has participated in the lung cancer  screening program, most recently had a RADS 1 scan in January 2021.  On questioning today it sounds like he actually quit 18 or 19 years ago.  Based on that he should not need any more screening CTs.  Baltazar Apo, MD, PhD 12/20/2019, 2:54 PM [facility] [phone] or if no answer [phone]

## 2019-12-20 NOTE — Addendum Note (Signed)
Addended by: Gavin Potters R on: 12/20/2019 03:19 PM   Modules accepted: Orders

## 2019-12-26 ENCOUNTER — Encounter: Payer: Self-pay | Admitting: Family Medicine

## 2019-12-27 ENCOUNTER — Telehealth: Payer: Self-pay | Admitting: Adult Health

## 2019-12-27 MED ORDER — AMPHETAMINE-DEXTROAMPHETAMINE 30 MG PO TABS: 30.0000 mg | ORAL_TABLET | Freq: Every day | ORAL | 0 refills | Status: DC

## 2019-12-27 NOTE — Telephone Encounter (Signed)
Pt request refill amphetamine-dextroamphetamine (ADDERALL) 30 MG tablet at [id number]  

## 2019-12-27 NOTE — Addendum Note (Signed)
Addended by: Trudie Buckler on: 12/27/2019 04:51 PM   Modules accepted: Orders

## 2019-12-27 NOTE — Telephone Encounter (Signed)
Refill request for Adderall 30mg  at [id number]  Per [place] registry  LAST FILL DATE 11/29/19 60 tabs for 30 days - ordered from  [place] 09/26/2019 with [address] np Next FU  03/28/20

## 2019-12-28 DIAGNOSIS — F4321 Adjustment disorder with depressed mood: Secondary | ICD-10-CM | POA: Diagnosis not present

## 2019-12-29 ENCOUNTER — Other Ambulatory Visit: Payer: Self-pay | Admitting: Family Medicine

## 2020-01-04 ENCOUNTER — Encounter: Payer: Self-pay | Admitting: Cardiology

## 2020-01-04 ENCOUNTER — Ambulatory Visit: Payer: BC Managed Care – PPO | Admitting: Cardiology

## 2020-01-04 ENCOUNTER — Other Ambulatory Visit: Payer: Self-pay

## 2020-01-04 VITALS — BP 130/76 | HR 69 | Ht 73.0 in | Wt 177.8 lb

## 2020-01-04 DIAGNOSIS — R0989 Other specified symptoms and signs involving the circulatory and respiratory systems: Secondary | ICD-10-CM | POA: Insufficient documentation

## 2020-01-04 DIAGNOSIS — I2510 Atherosclerotic heart disease of native coronary artery without angina pectoris: Secondary | ICD-10-CM

## 2020-01-04 DIAGNOSIS — E785 Hyperlipidemia, unspecified: Secondary | ICD-10-CM | POA: Diagnosis not present

## 2020-01-04 DIAGNOSIS — I10 Essential (primary) hypertension: Secondary | ICD-10-CM

## 2020-01-04 DIAGNOSIS — I7300 Raynaud's syndrome without gangrene: Secondary | ICD-10-CM

## 2020-01-04 DIAGNOSIS — Z9861 Coronary angioplasty status: Secondary | ICD-10-CM

## 2020-01-04 NOTE — Patient Instructions (Addendum)
Medication Instructions:  Continue same medications *If you need a refill on your cardiac medications before your next appointment, please call your pharmacy*   Lab Work: Cmet,lipid panel this month    Testing/Procedures: Schedule Carotid dopplers   Follow-Up: At [facility], you and your health needs are our priority.  As part of our continuing mission to provide you with exceptional heart care, we have created designated Provider Care Teams.  These Care Teams include your primary Cardiologist (physician) and Advanced Practice Providers (APPs -  Physician Assistants and Nurse Practitioners) who all work together to provide you with the care you need, when you need it.  We recommend signing up for the patient portal called "MyChart".  Sign up information is provided on this After Visit Summary.  MyChart is used to connect with patients for Virtual Visits (Telemedicine).  Patients are able to view lab/test results, encounter notes, upcoming appointments, etc.  Non-urgent messages can be sent to your provider as well.   To learn more about what you can do with MyChart, go to [email or web address].    Your next appointment:  Wed 06/06/20 at 11:00 am   The format for your next appointment: Office    Provider:  Dr.Harding

## 2020-01-04 NOTE — Progress Notes (Signed)
Primary Care Provider: [name], MD Cardiologist: [name], MD Electrophysiologist: None  Clinic Note: Chief Complaint  Patient presents with  . Follow-up    13-month  . Coronary Artery Disease    No further angina  . Hyperlipidemia    Restarted on Repatha.    HPI:    Phillip [name] is a 64 y.o. male with a PMH notable for CAD, HTN, HLD who presents today for ER visit but also for routine 6-60-month follow-up   CAD: (Feb 2020 -elevated CORONARY CALCIUM SCORE --> CORONARY CTA with LA lesion --> Cath & LAD DES PCI)  Phillip [name] was last seen on June 15, 2019.  He was doing pretty well at that time still having memory issues.  No further chest pain.  He had gone to the emergency room earlier that month for probable musculoskeletal type chest pain.  No real anginal chest pain.  Occasional skipped beats but nothing significant.   Seen by Tommy Medal, RPH- CCP from [facility] on August 01, 2019 for evaluation of hyperlipidemia.  Plan was to restart start Repatha and check labs in 3 months.  Seems to be tolerating well.  Recent Hospitalizations:   None  Reviewed  CV studies:    The following studies were reviewed today: (if available, images/films reviewed: From Epic Chart or Care Everywhere) . none:   Interval History:   Phillip [name] is here today as usual accompanied by one of his friends to help out with his memory issues.  [name] has started on a vegan diet cutting out just by any animal products as part of a change in his healthcare.  He is also increase his level of exercise and he feels much better.  Energy level notably improved.  He has lost about 12 pounds since January. Little concern because an MRI showing a possible old stroke.  There is question about the need for carotid Dopplers which seems reasonable.  He has not had any recurrent symptoms of unilateral weakness or numbness.  No headache or blurred vision  No chest pain or pressure with rest or  exertion.  No palpitations just a few skipped beats here and there.  No heart failure symptoms.  CV Review of Symptoms (Summary): no chest pain or dyspnea on exertion positive for - Rare skipped beats.  Otherwise doing well. negative for - edema, orthopnea, paroxysmal nocturnal dyspnea, rapid heart rate, shortness of breath or Syncope/near syncope, TIA/amaurosis fugax, claudication  The patient DOES NOT have symptoms concerning for COVID-19 infection (fever, chills, cough, or new shortness of breath).  The patient is practicing social distancing & Masking.    REVIEWED OF SYSTEMS   A comprehensive ROS was performed. Review of Systems  Constitutional: Positive for weight loss (Intentional based on change in diet and increase exercise.). Negative for malaise/fatigue.  HENT: Negative for congestion and nosebleeds.   Respiratory: Negative for cough and shortness of breath.   Gastrointestinal: Negative for blood in stool and melena.  Genitourinary: Negative for hematuria.  Musculoskeletal: Negative for joint pain and myalgias.  Neurological: Positive for dizziness (Some positional dizziness). Negative for focal weakness and weakness.  Psychiatric/Behavioral: Negative for memory loss. The patient is not nervous/anxious.    I have reviewed and (if needed) personally updated the patient's problem list, medications, allergies, past medical and surgical history, social and family history.   PAST MEDICAL HISTORY   Past Medical History:  Diagnosis Date  . ADD (attention deficit disorder)    on adderrall  managed by neurology  . Anxiety    on ativan managed by Neurology  . 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 CAD S/P percutaneous coronary angioplasty 06/2017   pLAD1 55% (not significant), p-mLAD2 80% & mLAD 70% (tandem lesions - DES PCI . Synergy DES 2.75 x 38 -- 3.0 mm).  mCx ~60% - med Rx, mRCA 30% & 40%.   ER 55-60%.  . Cellulitis   . Chronic traumatic encephalopathy    right frontal-temproal lobe  . Complication of anesthesia    " BRAIN FOG "  . Coronary artery calcification seen on CAT scan 05/12/2018   Triple- by CT - lung ca screen 04/2018  Cardio 06/2018: Coronary calcium score results shows aortic atherosclerosis (with mild dilation) as well as left main and three-vessel coronary artery calcification. The calcium score is 1168 which is very high risk amount of calcium.  Based on these findings, I would recommend proceeding with a coronary CT angiogram (a more detailed study with Intravenous X  . Depression   . Erectile dysfunction 11/2016   follows with urology; sildenafil prescribed  . Meningoencephalitis 1994   [place]  . Pneumonia   . Primary hypogonadism in male   . Raynaud's disease 13/12/6576   As complication of RMSF  . Restless legs 09/13/2015  . [place] spotted fever 2017  . TBI (traumatic brain injury) ([place]) 1997, 2009   "concussions"   Immunization History  Administered Date(s) Administered  . PFIZER SARS-COV-2 Vaccination 07/17/2019, 08/08/2019  . Tdap 05/05/2010    PAST SURGICAL HISTORY   Past Surgical History:  Procedure Laterality Date  . CORONARY STENT INTERVENTION N/A 06/30/2018   Procedure: CORONARY STENT INTERVENTION;  Surgeon: [name], MD;  [facility]: [facility];;  p-mLAD2 80% & mLAD 70% (tandem lesions) - DES PCI Synergy DES 2.75 x 38 (3.0 mm).  . CT CTA CORONARY W/CA SCORE W/CM &/OR WO/CM  06/2018   Cardiac cath score read 1318.  Moderate coronary disease sent for FFR: mRCA 0.84 (non-significant), mLAD 0.64 (signifiant), pLOM(Cx) - 0,72 (significant - but distal lesion)  . LEFT HEART CATH AND CORONARY ANGIOGRAPHY N/A 06/30/2018   Procedure: LEFT HEART CATH AND CORONARY ANGIOGRAPHY;  Surgeon: [name], MD;  Location: [facility];;  pLAD1 55% (not significant), p-mLAD2 80% & mLAD 70% (tandem lesions -> DES PCI). mCx ~60%  (med Rx), mRCA 30% & 40%.  ER 55-60%.   . TEE WITHOUT CARDIOVERSION N/A 09/06/2015   Procedure: TRANSESOPHAGEAL ECHOCARDIOGRAM (TEE);  Surgeon: Jerline Pain, MD;  R/O endocarditis;  . TONSILLECTOMY  1965    Cardiac Cath2/26/2020:pLAD1 55% (not significant), p-mLAD2 80% & mLAD 70% (tandem lesions) --> - DES PCI Synergy DES 2.75 x 38 (3.0 mm).. mCx ~60%, mRCA 30% &40%. (med Rx)ER 55-60%.     MEDICATIONS/ALLERGIES   Current Meds  Medication Sig  . amLODipine (NORVASC) 5 MG tablet TAKE 1 TABLET BY MOUTH EVERY DAY  . amphetamine-dextroamphetamine (ADDERALL) 30 MG tablet Take 1-2 tablets by mouth daily.  . Ascorbic Acid (VITAMIN C PO) Take 1 tablet by mouth 2 (two) times daily.  . B Complex-C (B-COMPLEX WITH VITAMIN C) tablet Take 1 tablet by mouth daily.  . Cholecalciferol 125 MCG (5000 UT) TABS   . clopidogrel (PLAVIX) 75 MG tablet TAKE 1 TABLET BY MOUTH EVERY DAY WITH BREAKFAST  . Coenzyme Q10 (COQ10) 100 MG CAPS Take 100 mg  by mouth 2 (two) times daily.  . Evolocumab (REPATHA SURECLICK) 017 MG/ML SOAJ Inject 140 mg into the skin every 14 (fourteen) days.  . Menaquinone-7 (VITAMIN K2) 100 MCG CAPS   . Misc Natural Products (PROSTATE SUPPORT PO) Take 1 capsule by mouth every evening. Prostate Plus  . nitroGLYCERIN (NITROSTAT) 0.4 MG SL tablet PLACE 1 TABLET UNDER TONGUE EVERY 5 MINS AS NEEDED FOR CHEST PAIN  . Omega-3 Fatty Acids (SUPER OMEGA 3 EPA/DHA PO) Take 1 capsule by mouth daily.  . Probiotic Product ([employer] PROBIOTIC) CAPS   . sertraline (ZOLOFT) 25 MG tablet TAKE 1 TABLET BY MOUTH EVERY DAY  . testosterone cypionate (DEPOTESTOSTERONE CYPIONATE) 200 MG/ML injection Inject 0.3 ML once a week  . Zinc Acetate (GALZIN) 25 MG CAPS Take by mouth.    Allergies  Allergen Reactions  . Penicillins Hives    Did it involve swelling of the face/tongue/throat, SOB, or low BP? Unknown Did it involve sudden or severe rash/hives, skin peeling, or any reaction on the inside of your mouth  or nose? Yes Did you need to seek medical attention at a hospital or doctor's office? Unknown When did it last happen?Occurred at age 41 or 57 If all above answers are "NO", may proceed with cephalosporin use.   . Statins Other (See Comments)    confusion  . Methylprednisolone Other (See Comments)    Unsure of exact reaction type  . Amphetamines Other (See Comments)    Intolerant to specific formulations: [employer] amphetamine TEVA dextroamp amphetamine  . Lorazepam Other (See Comments)    [employer] lorazepam; others ok  . Propofol Other (See Comments)    Cognitive delay and emotional    SOCIAL HISTORY/FAMILY HISTORY   Social History   Tobacco Use  . Smoking status: Former Smoker    Packs/day: 1.50    Years: 25.00    Pack years: 37.50    Types: Cigarettes    Quit date: 11/22/2014    Years since quitting: 5.1  . Smokeless tobacco: Never Used  . Tobacco comment: Encouraged to remain smoke free  Vaping Use  . Vaping Use: Never used  Substance Use Topics  . Alcohol use: No    Alcohol/week: 0.0 standard drinks  . Drug use: No   Social History   Social History Narrative   Divorced. B.A. degree. Retired.   Drinks caffeine, uses herbal remedies, takes a daily vitamin.   Wears his seatbelt.  Smoke detector in the home.   Firearms locked in the home.   Feels safe in relationships.       He now has switched to a vegan diet.  Notably increase exercise level.    Family History family history includes Cancer in his paternal grandfather; Heart disease (age of onset: 63) in his maternal grandfather; Hyperlipidemia in his father; Mental illness in his brother and mother.  OBJCTIVE -PE, EKG, labs   Wt Readings from Last 3 Encounters:  01/04/20 177 lb 12.8 oz (80.6 kg)  12/20/19 179 lb 9.6 oz (81.5 kg)  11/25/19 182 lb (82.6 kg)  January 2021-189 lb, February 2021-185 lb  Physical Exam: BP 130/76   Pulse 69   Ht 6\' 1"  (1.854 m)   Wt 177 lb 12.8 oz (80.6 kg)   SpO2 97%    BMI 23.46 kg/m  Physical Exam Vitals reviewed.  Constitutional:      General: He is not in acute distress.    Appearance: Normal appearance. He is normal weight. He is not ill-appearing.  Comments: Healthy-appearing.  Well-groomed.  HENT:     Head: Normocephalic and atraumatic.  Neck:     Vascular: Carotid bruit (Cannot exclude soft right carotid bruit.) present. No hepatojugular reflux or JVD.  Cardiovascular:     Rate and Rhythm: Normal rate and regular rhythm.     Pulses: Normal pulses.     Heart sounds: Normal heart sounds. No murmur heard.  No friction rub. No gallop.   Pulmonary:     Effort: Pulmonary effort is normal. No respiratory distress.     Breath sounds: Normal breath sounds.  Chest:     Chest wall: No tenderness.  Musculoskeletal:        General: No swelling. Normal range of motion.     Cervical back: Normal range of motion.  Neurological:     General: No focal deficit present.     Mental Status: He is alert and oriented to person, place, and time.  Psychiatric:        Mood and Affect: Mood normal.        Behavior: Behavior normal.        Thought Content: Thought content normal.        Judgment: Judgment normal.     Comments: He is a little bit slow with answers, but actually quite interactive today.     Adult ECG Report Not checked  Recent Labs:    Lab Results  Component Value Date   CHOL 199 07/08/2019   HDL 44 07/08/2019   LDLCALC 133 (H) 07/08/2019   TRIG 121 07/08/2019   CHOLHDL 4.5 07/08/2019   Lab Results  Component Value Date   CREATININE 0.91 07/08/2019   BUN 12 07/08/2019   NA 141 07/08/2019   K 5.1 07/08/2019   CL 104 07/08/2019   CO2 24 07/08/2019   Lab Results  Component Value Date   TSH 2.67 12/15/2017    ASSESSMENT/PLAN    Problem List Items Addressed This Visit    CAD S/P percutaneous coronary angioplasty (Chronic)    Single-vessel CAD with PCI to the LAD.  He is a year and a half out.  Remains on Plavix  maintenance dose--okay to interrupt.  Okay to hold Plavix 5 to 7 days preop for surgeries or procedures.      Relevant Orders   VAS US CAROTID   Comprehensive Metabolic Panel (CMET)   Lipid panel   Coronary artery disease involving native coronary artery of native heart without angina pectoris - Primary (Chronic)    No further angina.  Medical management for RCA and circumflex disease.  Not physiologically significant on CT scan.  Did not necessarily have any anginal symptoms with the LAD lesion.  Now on amlodipine along with Plavix and Repatha.  Not on beta-blocker because of fatigue issues and bradycardia in the past.  No longer on Imdur. Noninvasive her for ARB because of blood pressures being stable.      Relevant Orders   VAS US CAROTID   Comprehensive Metabolic Panel (CMET)   Lipid panel   Hyperlipidemia with target LDL less than 70 (Chronic)    Started back on Repatha in March.  Is due for lipid levels to be rechecked. Did not do well with statins in the past.  Continue co-Q10 along with Repatha.      Relevant Orders   VAS US CAROTID   Comprehensive Metabolic Panel (CMET)   Lipid panel   Essential hypertension (Chronic)    Blood pressure is borderline today, but okay for  now.  If pressures do increase, would probably consider adding ARB.      Relevant Orders   VAS US CAROTID   Comprehensive Metabolic Panel (CMET)   Lipid panel   Raynaud's disease    We have chosen amlodipine to treat CAD and hypertension because of Raynaud's.      Carotid bruit    I suspect the soft bruit of hearing may very well be his breathing, however with concern for possible CVA on MRI, not unreasonable to exclude carotid disease in a patient with CAD.  Plan: Check carotid Dopplers.      Relevant Orders   VAS US CAROTID   Comprehensive Metabolic Panel (CMET)   Lipid panel       COVID-19 Education: The signs and symptoms of COVID-19 were discussed with the patient and how to seek  care for testing (follow up with PCP or arrange E-visit).   The importance of social distancing was discussed today.  I spent a total of 18 minutes with the patient. >  50% of the time was spent in direct patient consultation.  Additional time spent with chart review  / charting (studies, outside notes, etc): 6 Total Time: 24 min   Current medicines are reviewed at length with the patient today.  (+/- concerns) none   Patient Instructions / Medication Changes & Studies & Tests Ordered   Patient Instructions  Medication Instructions:  Continue same medications *If you need a refill on your cardiac medications before your next appointment, please call your pharmacy*   Lab Work: Cmet,lipid panel this month    Testing/Procedures: Schedule Carotid dopplers   Follow-Up: At [facility], you and your health needs are our priority.  As part of our continuing mission to provide you with exceptional heart care, we have created designated Provider Care Teams.  These Care Teams include your primary Cardiologist (physician) and Advanced Practice Providers (APPs -  Physician Assistants and Nurse Practitioners) who all work together to provide you with the care you need, when you need it.  We recommend signing up for the patient portal called "MyChart".  Sign up information is provided on this After Visit Summary.  MyChart is used to connect with patients for Virtual Visits (Telemedicine).  Patients are able to view lab/test results, encounter notes, upcoming appointments, etc.  Non-urgent messages can be sent to your provider as well.   To learn more about what you can do with MyChart, go to [email or web address].    Your next appointment:  Wed 06/06/20 at 11:00 am   The format for your next appointment: Office    Provider:  Dr.Amirah Goerke    Studies Ordered:   Orders Placed This Encounter  Procedures  . Comprehensive Metabolic Panel (CMET)  . Lipid panel  . VAS US CAROTID      [name], M.D., M.S. Interventional Cardiologist   Pager # [phone] Phone # [phone] [address] [place], [place] [address]   Thank you for choosing [facility] at [facility]!!

## 2020-01-05 ENCOUNTER — Telehealth: Payer: Self-pay | Admitting: Cardiology

## 2020-01-05 NOTE — Telephone Encounter (Signed)
Left message for patient to call and schedule Carotid dopplers ordered by Dr. Ellyn Hack

## 2020-01-11 DIAGNOSIS — F4321 Adjustment disorder with depressed mood: Secondary | ICD-10-CM | POA: Diagnosis not present

## 2020-01-12 ENCOUNTER — Encounter: Payer: Self-pay | Admitting: Cardiology

## 2020-01-12 NOTE — Assessment & Plan Note (Signed)
No further angina.  Medical management for RCA and circumflex disease.  Not physiologically significant on CT scan.  Did not necessarily have any anginal symptoms with the LAD lesion.  Now on amlodipine along with Plavix and Repatha.  Not on beta-blocker because of fatigue issues and bradycardia in the past.  No longer on Imdur. Noninvasive her for ARB because of blood pressures being stable.

## 2020-01-12 NOTE — Assessment & Plan Note (Signed)
We have chosen amlodipine to treat CAD and hypertension because of Raynaud's.

## 2020-01-12 NOTE — Assessment & Plan Note (Signed)
Single-vessel CAD with PCI to the LAD.  He is a year and a half out.  Remains on Plavix maintenance dose--okay to interrupt.  Okay to hold Plavix 5 to 7 days preop for surgeries or procedures.

## 2020-01-12 NOTE — Assessment & Plan Note (Signed)
Blood pressure is borderline today, but okay for now.  If pressures do increase, would probably consider adding ARB.

## 2020-01-12 NOTE — Assessment & Plan Note (Signed)
I suspect the soft bruit of hearing may very well be his breathing, however with concern for possible CVA on MRI, not unreasonable to exclude carotid disease in a patient with CAD.  Plan: Check carotid Dopplers.

## 2020-01-12 NOTE — Assessment & Plan Note (Addendum)
Started back on Repatha in March.  Is due for lipid levels to be rechecked. Did not do well with statins in the past.  Continue co-Q10 along with Repatha.

## 2020-01-16 ENCOUNTER — Other Ambulatory Visit: Payer: Self-pay | Admitting: Cardiology

## 2020-01-18 DIAGNOSIS — F4321 Adjustment disorder with depressed mood: Secondary | ICD-10-CM | POA: Diagnosis not present

## 2020-01-19 ENCOUNTER — Encounter: Payer: Self-pay | Admitting: Family Medicine

## 2020-01-20 MED ORDER — SERTRALINE HCL 25 MG PO TABS: 50.0000 mg | ORAL_TABLET | Freq: Every day | ORAL | 3 refills | Status: DC

## 2020-01-24 ENCOUNTER — Ambulatory Visit (HOSPITAL_COMMUNITY)
Admission: RE | Admit: 2020-01-24 | Discharge: 2020-01-24 | Disposition: A | Discharge status: Home / Self Care | Payer: BC Managed Care – PPO | Source: Ambulatory Visit | Attending: Cardiovascular Disease | Admitting: Cardiovascular Disease

## 2020-01-24 ENCOUNTER — Other Ambulatory Visit: Payer: Self-pay

## 2020-01-24 ENCOUNTER — Other Ambulatory Visit: Payer: Self-pay | Admitting: Cardiology

## 2020-01-24 ENCOUNTER — Other Ambulatory Visit: Payer: Self-pay | Admitting: Adult Health

## 2020-01-24 DIAGNOSIS — I2510 Atherosclerotic heart disease of native coronary artery without angina pectoris: Secondary | ICD-10-CM | POA: Insufficient documentation

## 2020-01-24 DIAGNOSIS — E785 Hyperlipidemia, unspecified: Secondary | ICD-10-CM

## 2020-01-24 DIAGNOSIS — Z9861 Coronary angioplasty status: Secondary | ICD-10-CM | POA: Diagnosis not present

## 2020-01-24 DIAGNOSIS — I10 Essential (primary) hypertension: Secondary | ICD-10-CM | POA: Insufficient documentation

## 2020-01-24 DIAGNOSIS — R0989 Other specified symptoms and signs involving the circulatory and respiratory systems: Secondary | ICD-10-CM

## 2020-01-24 DIAGNOSIS — N5201 Erectile dysfunction due to arterial insufficiency: Secondary | ICD-10-CM | POA: Diagnosis not present

## 2020-01-24 DIAGNOSIS — R948 Abnormal results of function studies of other organs and systems: Secondary | ICD-10-CM | POA: Diagnosis not present

## 2020-01-24 MED ORDER — AMPHETAMINE-DEXTROAMPHETAMINE 30 MG PO TABS: 30.0000 mg | ORAL_TABLET | Freq: Every day | ORAL | 0 refills | Status: DC

## 2020-01-24 NOTE — Telephone Encounter (Signed)
This Dr. Allison Quarry pt

## 2020-01-24 NOTE — Telephone Encounter (Signed)
Pt's friend, Phillip [name], request refill amphetamine-dextroamphetamine (ADDERALL) 30 MG tablet at [id number]

## 2020-01-27 ENCOUNTER — Encounter (HOSPITAL_COMMUNITY): Payer: BC Managed Care – PPO

## 2020-01-31 ENCOUNTER — Telehealth: Payer: Self-pay | Admitting: Cardiology

## 2020-01-31 NOTE — Telephone Encounter (Signed)
New Message   [name] called in( verified she is on DPR)  She stated pt would like someone to call her to explain to her what the result of the doper was on 9/21.  She stated the pt is to nervous and upset to call.    Best number [phone]

## 2020-01-31 NOTE — Telephone Encounter (Signed)
Spoke to Phillip [name] (ok per [facility]) discussed results and she verbalized understanding.    Carotid Doppler results: Relatively normal/low risk findings included less than 40% stenosis in both internal carotid arteries and less than 50% noted in the common carotids. Both subclavian arteries and left vertebral artery appeared to be normal. 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 --> There was no comment on this on the MRI of the brain. I do not see that there was a MRA done. Usually this type of finding is related to one of the Vertebral arteries not having fully developed, but we have no way of knowing.   Would not expect that it was an acute finding.  Recommend that future brain imaging includes MRA (angiogram) or CT Angiogram to determine where the vessel is "occluded".   [name], MD

## 2020-02-01 ENCOUNTER — Ambulatory Visit (INDEPENDENT_AMBULATORY_CARE_PROVIDER_SITE_OTHER): Payer: BC Managed Care – PPO | Admitting: Emergency Medicine

## 2020-02-01 ENCOUNTER — Encounter: Payer: Self-pay | Admitting: Emergency Medicine

## 2020-02-01 ENCOUNTER — Other Ambulatory Visit: Payer: Self-pay

## 2020-02-01 DIAGNOSIS — J439 Emphysema, unspecified: Secondary | ICD-10-CM | POA: Diagnosis not present

## 2020-02-01 DIAGNOSIS — Z87891 Personal history of nicotine dependence: Secondary | ICD-10-CM

## 2020-02-01 LAB — PULMONARY FUNCTION TEST
DL/VA % pred: 109 %
DL/VA: 4.55 ml/min/mmHg/L
DLCO cor % pred: 100 %
DLCO cor: 30.07 ml/min/mmHg
DLCO unc % pred: 100 %
DLCO unc: 30.07 ml/min/mmHg
FEF 25-75 Post: 4.28 L/sec
FEF 25-75 Pre: 4.37 L/sec
FEF2575-%Change-Post: -2 %
FEF2575-%Pred-Post: 135 %
FEF2575-%Pred-Pre: 138 %
FEV1-%Change-Post: 0 %
FEV1-%Pred-Post: 98 %
FEV1-%Pred-Pre: 98 %
FEV1-Post: 3.87 L
FEV1-Pre: 3.88 L
FEV1FVC-%Change-Post: 3 %
FEV1FVC-%Pred-Pre: 110 %
FEV6-%Change-Post: -3 %
FEV6-%Pred-Post: 89 %
FEV6-%Pred-Pre: 92 %
FEV6-Post: 4.48 L
FEV6-Pre: 4.63 L
FEV6FVC-%Pred-Post: 104 %
FEV6FVC-%Pred-Pre: 104 %
FVC-%Change-Post: -3 %
FVC-%Pred-Post: 85 %
FVC-%Pred-Pre: 89 %
FVC-Post: 4.48 L
FVC-Pre: 4.66 L
Post FEV1/FVC ratio: 86 %
Post FEV6/FVC ratio: 100 %
Pre FEV1/FVC ratio: 83 %
Pre FEV6/FVC Ratio: 100 %
RV % pred: 75 %
RV: 1.86 L
TLC % pred: 96 %
TLC: 7.32 L

## 2020-02-01 NOTE — Patient Instructions (Signed)
Your pulmonary function testing shows normal airflows, normal total lung capacity.  This is good news. You should not need anymore lung cancer screening CT scans since you stop smoking over 15 years ago. Please contact us and come in to be seen if you develop any change in your breathing, coughing, wheezing, chest tightness or any other respiratory symptoms.

## 2020-02-01 NOTE — Assessment & Plan Note (Signed)
Noted on imaging.  His pulmonary function testing is grossly normal, he is asymptomatic.  No real evidence for COPD other than the mild emphysema.  No bronchodilators needed at this time.  We have baseline pulmonary function testing.  If his breathing changes, if he develops new respiratory symptoms then he will follow-up and we may consider repeat PFT and/or bronchodilators.

## 2020-02-01 NOTE — Assessment & Plan Note (Signed)
Former smoker, quit over 15 years ago.  He was participating in the lung cancer screening program.  Most recent scan was RADS 1.  He should not need any further scans based on his date of cessation.

## 2020-02-01 NOTE — Progress Notes (Signed)
° °  Subjective:    Patient ID: Phillip [name], male    DOB: 02/26/1956, 64 y.o.   MRN: [id number]  HPI 64 year old former smoker (38 pack years, quit ~ 2003) with a history of ADD, anxiety, chronic traumatic encephalopathy, CAD with PTCI 06/2018, history of RMSF.  He is referred here today for evaluation of mild shortness of breath and some emphysema seen on his CT chest. He has noticed a bit heavier breathing during exercise. He is still able to exercise, exert as much as he wants. No wheeze, no cough, no CP He participates in lung cancer screening program, last scan was 05/09/2019 which I reviewed, was a RADS 1 study. This did show some emphysematous change.  Chest x-ray 06/08/2019 was normal, reviewed by me.  ROV 02/01/20 --64 year old former smoker with anxiety, ADD, chronic traumatic encephalopathy, CAD.  I saw him in August for exertional dyspnea and emphysematous change on imaging.  We performed baseline pulmonary function testing today and I have reviewed, show normal airflows without a bronchodilator response, normal TLC but a decreased RV that could suggest restriction and a normal diffusion capacity.    Review of Systems As per HPI     Objective:   Physical Exam Vitals:   02/01/20 1610  BP: 140/74  Pulse: 89  Temp: 98.1 F (36.7 C)  TempSrc: Temporal  SpO2: 98%  Weight: 180 lb 6.4 oz (81.8 kg)  Height: 6\' 1"  (1.854 m)  Gen: Pleasant, well-nourished, in no distress,  normal affect  ENT: No lesions,  mouth clear,  oropharynx clear, no postnasal drip  Neck: No JVD, no stridor  Lungs: No use of accessory muscles, no crackles or wheezing on normal respiration, no wheeze on forced expiration  Cardiovascular: RRR, heart sounds normal, no murmur or gallops, no peripheral edema  Musculoskeletal: No deformities, no cyanosis or clubbing  Neuro: alert, awake, non focal  Skin: Warm, no lesions or rash      Assessment & Plan:  Mild emphysema (HCC) Noted on imaging.  His pulmonary  function testing is grossly normal, he is asymptomatic.  No real evidence for COPD other than the mild emphysema.  No bronchodilators needed at this time.  We have baseline pulmonary function testing.  If his breathing changes, if he develops new respiratory symptoms then he will follow-up and we may consider repeat PFT and/or bronchodilators.  History of tobacco abuse Former smoker, quit over 15 years ago.  He was participating in the lung cancer screening program.  Most recent scan was RADS 1.  He should not need any further scans based on his date of cessation.  Baltazar Apo, MD, PhD 02/01/2020, 4:32 PM [facility] [phone] or if no answer [phone]

## 2020-02-01 NOTE — Progress Notes (Signed)
Full PFT performed today. °

## 2020-02-03 DIAGNOSIS — E291 Testicular hypofunction: Secondary | ICD-10-CM | POA: Diagnosis not present

## 2020-02-03 DIAGNOSIS — F4321 Adjustment disorder with depressed mood: Secondary | ICD-10-CM | POA: Diagnosis not present

## 2020-02-03 DIAGNOSIS — N5201 Erectile dysfunction due to arterial insufficiency: Secondary | ICD-10-CM | POA: Diagnosis not present

## 2020-02-03 DIAGNOSIS — Z125 Encounter for screening for malignant neoplasm of prostate: Secondary | ICD-10-CM | POA: Diagnosis not present

## 2020-02-09 ENCOUNTER — Other Ambulatory Visit: Payer: BC Managed Care – PPO

## 2020-02-10 ENCOUNTER — Other Ambulatory Visit (INDEPENDENT_AMBULATORY_CARE_PROVIDER_SITE_OTHER): Payer: BC Managed Care – PPO

## 2020-02-10 ENCOUNTER — Other Ambulatory Visit: Payer: Self-pay

## 2020-02-10 DIAGNOSIS — E230 Hypopituitarism: Secondary | ICD-10-CM

## 2020-02-10 DIAGNOSIS — N529 Male erectile dysfunction, unspecified: Secondary | ICD-10-CM | POA: Diagnosis not present

## 2020-02-10 LAB — PSA: PSA: 0.51 ng/mL (ref 0.10–4.00)

## 2020-02-13 ENCOUNTER — Encounter: Payer: Self-pay | Admitting: Family Medicine

## 2020-02-13 DIAGNOSIS — F4321 Adjustment disorder with depressed mood: Secondary | ICD-10-CM | POA: Diagnosis not present

## 2020-02-15 DIAGNOSIS — L814 Other melanin hyperpigmentation: Secondary | ICD-10-CM | POA: Diagnosis not present

## 2020-02-15 DIAGNOSIS — L821 Other seborrheic keratosis: Secondary | ICD-10-CM | POA: Diagnosis not present

## 2020-02-18 LAB — TESTOSTERONE, FREE AND TOTAL (INCLUDES SHBG)-(MALES)
% Free Testosterone: 0.6 %
Free Testosterone, S: 45 pg/mL — ABNORMAL LOW
Sex Hormone Binding Globulin: 78.1 nmol/L — ABNORMAL HIGH
Testosterone, Serum (Total): 749 ng/dL

## 2020-02-21 ENCOUNTER — Other Ambulatory Visit: Payer: Self-pay | Admitting: Internal Medicine

## 2020-02-21 DIAGNOSIS — E230 Hypopituitarism: Secondary | ICD-10-CM

## 2020-02-22 ENCOUNTER — Other Ambulatory Visit: Payer: Self-pay

## 2020-02-22 ENCOUNTER — Encounter: Payer: Self-pay | Admitting: Family Medicine

## 2020-02-22 ENCOUNTER — Other Ambulatory Visit: Payer: Self-pay | Admitting: Adult Health

## 2020-02-22 ENCOUNTER — Ambulatory Visit (INDEPENDENT_AMBULATORY_CARE_PROVIDER_SITE_OTHER): Payer: BC Managed Care – PPO | Admitting: Family Medicine

## 2020-02-22 VITALS — BP 134/73 | HR 79

## 2020-02-22 DIAGNOSIS — G4761 Periodic limb movement disorder: Secondary | ICD-10-CM | POA: Diagnosis not present

## 2020-02-22 DIAGNOSIS — I6501 Occlusion and stenosis of right vertebral artery: Secondary | ICD-10-CM

## 2020-02-22 MED ORDER — AMPHETAMINE-DEXTROAMPHETAMINE 30 MG PO TABS: 30.0000 mg | ORAL_TABLET | Freq: Every day | ORAL | 0 refills | Status: DC

## 2020-02-22 NOTE — Telephone Encounter (Signed)
Pt request refill amphetamine-dextroamphetamine (ADDERALL) 30 MG tablet at [id number]

## 2020-02-22 NOTE — Patient Instructions (Signed)
   As an experiment, could try the following:  Iron (ferrous sulfate or gluconate) 325 mg every other day  Vitamin C 100-200 mg with each dose of iron    If the above helps your sleep, we will need to check iron levels in 6-8 weeks to be sure you're not getting too much.

## 2020-02-22 NOTE — Progress Notes (Signed)
Office Visit Note   Patient: Phillip [name]           Date of Birth: 1956/03/12           MRN: [id number] Visit Date: 02/22/2020 Requested by: [name], MD [place],  [place] [address] PCP: [name], MD  Subjective: Chief Complaint  Patient presents with  . discuss MRIs (brain), carotid study & sleep study    HPI: He is here to discuss sleep troubles.  He has had isues with sleep for a long time, but now that he has a girlfriend she has noticed that it is pretty bad.  In 2014 this was noted on a sleep study.  They tried gabapentin but it did not help.  He feels like his poor sleep plays a big role in a lot of his troubles.  Also, he had brain MRI scan in 2017 and another one this year, both at outside facilities.  The reports showed different findings and he would like to have a radiologist compare the 2 MRI scans and given independent interpretation.  Finally, he had arterial studies done, Doppler studies, that showed right vertebral artery occlusion.  Another test was recommended, either CT or MRI angiogram.                ROS:   All other systems were reviewed and are negative.  Objective: Vital Signs: BP 134/73   Pulse 79   Physical Exam:  General:  Alert and oriented, in no acute distress. Pulm:  Breathing unlabored. Psy:  Normal mood, congruent affect.  No exam done today.  Imaging: No results found.  Assessment & Plan: 1.  Chronic sleep disturbance with history of periodic limb movement disorder -Referral to another sleep specialist for consultation. -Trial of iron and vitamin C every other day for the next several weeks.  If this helps, we will check iron studies to be sure he does not become overloaded.  2.  Brain MRI abnormality with encephalomalacia, possible old infarct. -We will ask a radiologist for independent interpretation.  3.  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     Procedures: No procedures performed  No notes on file     PMFS History: Patient Active Problem List   Diagnosis Date Noted  . Carotid bruit 01/04/2020  . History of tobacco abuse 12/20/2019  . Mild emphysema ([place]) 05/27/2019  . Essential hypertension 08/11/2018  . Statin intolerance 08/11/2018  . Coronary artery disease involving native coronary artery of native heart without angina pectoris 07/18/2018  . CAD S/P percutaneous coronary angioplasty 06/30/2018  . Abnormal computed tomography angiography (CTA)   . Hyperlipidemia with target LDL less than 70 05/22/2018  . 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 Chronic prescription benzodiazepine use 09/14/2017  . Chronic insomnia 09/14/2017  . Accidental testosterone overdose 03/16/2017  . Erectile dysfunction 12/09/2016  . 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 Encephalopathy, traumatic 10/08/2015  . RMSF [facility] spotted fever) 10/08/2015  . Raynaud's disease 10/08/2015  . 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 Anxiety   . Cognitive impairment 01/31/2013  . 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 ADD (attention deficit disorder) 03/10/2012   Past Medical History:  Diagnosis Date  . ADD (attention deficit disorder)    on adderrall managed by neurology  . Anxiety    on ativan managed by Neurology  . 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 Bartonella infection 2017   and  reported Ehrlichia  . CAD S/P percutaneous coronary angioplasty 06/2017   pLAD1 55% (not significant), p-mLAD2 80% & mLAD 70% (tandem lesions - DES PCI . Synergy DES 2.75 x 38 -- 3.0 mm).  mCx ~60% - med Rx, mRCA 30% & 40%.  ER 55-60%.  . Cellulitis   . Chronic traumatic encephalopathy    right frontal-temproal lobe  . Complication of anesthesia    " BRAIN FOG "  . Coronary artery calcification seen on CAT scan 05/12/2018   Triple- by CT - lung ca screen 04/2018  Cardio 06/2018: Coronary calcium score  results shows aortic atherosclerosis (with mild dilation) as well as left main and three-vessel coronary artery calcification. The calcium score is 1168 which is very high risk amount of calcium.  Based on these findings, I would recommend proceeding with a coronary CT angiogram (a more detailed study with Intravenous X  . Depression   . Erectile dysfunction 11/2016   follows with urology; sildenafil prescribed  . Meningoencephalitis 1994   [place]  . Pneumonia   . Primary hypogonadism in male   . Raynaud's disease 16/02/9603   As complication of RMSF  . Restless legs 09/13/2015  . [place] spotted fever 2017  . TBI (traumatic brain injury) ([place]) 1997, 2009   "concussions"    Family History  Problem Relation Age of Onset  . Hyperlipidemia Father   . Heart disease Maternal Grandfather 75  . Cancer Paternal Grandfather   . Mental illness Mother   . Mental illness Brother     Past Surgical History:  Procedure Laterality Date  . CORONARY STENT INTERVENTION N/A 06/30/2018   Procedure: CORONARY STENT INTERVENTION;  Surgeon: [name], MD;  [facility];;  p-mLAD2 80% & mLAD 70% (tandem lesions) - DES PCI Synergy DES 2.75 x 38 (3.0 mm).  . CT CTA CORONARY W/CA SCORE W/CM &/OR WO/CM  06/2018   Cardiac cath score read 1318.  Moderate coronary disease sent for FFR: mRCA 0.84 (non-significant), mLAD 0.64 (signifiant), pLOM(Cx) - 0,72 (significant - but distal lesion)  . LEFT HEART CATH AND CORONARY ANGIOGRAPHY N/A 06/30/2018   Procedure: LEFT HEART CATH AND CORONARY ANGIOGRAPHY;  Surgeon: [name], MD;  [facility];;  pLAD1 55% (not significant), p-mLAD2 80% & mLAD 70% (tandem lesions -> DES PCI). mCx ~60% (med Rx), mRCA 30% & 40%.  ER 55-60%.   . TEE WITHOUT CARDIOVERSION N/A 09/06/2015   Procedure: TRANSESOPHAGEAL ECHOCARDIOGRAM (TEE);  Surgeon: Jerline Pain, MD;  R/O endocarditis;  . TONSILLECTOMY  1965   Social History   Occupational  History  . Occupation: Retired  Tobacco Use  . Smoking status: Former Smoker    Packs/day: 1.50    Years: 25.00    Pack years: 37.50    Types: Cigarettes    Quit date: 11/22/2014    Years since quitting: 5.2  . Smokeless tobacco: Never Used  . Tobacco comment: Encouraged to remain smoke free  Vaping Use  . Vaping Use: Never used  Substance and Sexual Activity  . Alcohol use: No    Alcohol/week: 0.0 standard drinks  . Drug use: No  . Sexual activity: Yes    Partners: Female

## 2020-02-27 ENCOUNTER — Encounter: Payer: Self-pay | Admitting: Family Medicine

## 2020-02-27 DIAGNOSIS — G4700 Insomnia, unspecified: Secondary | ICD-10-CM

## 2020-02-27 DIAGNOSIS — G4761 Periodic limb movement disorder: Secondary | ICD-10-CM

## 2020-03-06 ENCOUNTER — Telehealth: Payer: Self-pay | Admitting: Family Medicine

## 2020-03-06 ENCOUNTER — Encounter: Payer: Self-pay | Admitting: Internal Medicine

## 2020-03-06 ENCOUNTER — Encounter: Payer: Self-pay | Admitting: Family Medicine

## 2020-03-06 DIAGNOSIS — G4761 Periodic limb movement disorder: Secondary | ICD-10-CM

## 2020-03-06 NOTE — Telephone Encounter (Signed)
I called and left a voice mail asking the patient to let me know if he is wanting to come here for the blood work or go to a freestanding lab.

## 2020-03-06 NOTE — Telephone Encounter (Signed)
Patient would like labs done in 6 weeks. His call back number is [phone]

## 2020-03-06 NOTE — Telephone Encounter (Signed)
Ordered

## 2020-03-06 NOTE — Telephone Encounter (Signed)
Please advise 

## 2020-03-07 DIAGNOSIS — H0014 Chalazion left upper eyelid: Secondary | ICD-10-CM | POA: Diagnosis not present

## 2020-03-07 DIAGNOSIS — H5712 Ocular pain, left eye: Secondary | ICD-10-CM | POA: Diagnosis not present

## 2020-03-07 NOTE — Addendum Note (Signed)
Addended by: Hortencia Pilar on: 03/07/2020 11:54 AM   Modules accepted: Orders

## 2020-03-08 ENCOUNTER — Encounter: Payer: Self-pay | Admitting: *Deleted

## 2020-03-09 NOTE — Telephone Encounter (Signed)
Sent this message through [place], as well. Awaiting the patient's response.

## 2020-03-15 ENCOUNTER — Encounter: Payer: Self-pay | Admitting: Family Medicine

## 2020-03-15 ENCOUNTER — Telehealth: Payer: Self-pay | Admitting: Family Medicine

## 2020-03-15 DIAGNOSIS — G4761 Periodic limb movement disorder: Secondary | ICD-10-CM

## 2020-03-15 DIAGNOSIS — G4700 Insomnia, unspecified: Secondary | ICD-10-CM

## 2020-03-15 NOTE — Telephone Encounter (Signed)
Pt wife called and would like to speak with terri regarding his sleep situation.

## 2020-03-16 MED ORDER — ZOLPIDEM TARTRATE 10 MG PO TABS: 10.0000 mg | ORAL_TABLET | Freq: Every evening | ORAL | 0 refills | Status: DC | PRN

## 2020-03-16 NOTE — Telephone Encounter (Signed)
I called - left voice mail to call back or send a message through [place].

## 2020-03-20 ENCOUNTER — Telehealth: Payer: Self-pay | Admitting: Family Medicine

## 2020-03-20 ENCOUNTER — Ambulatory Visit
Admission: RE | Admit: 2020-03-20 | Discharge: 2020-03-20 | Disposition: A | Discharge status: Home / Self Care | Payer: BC Managed Care – PPO | Source: Ambulatory Visit | Attending: Family Medicine | Admitting: Family Medicine

## 2020-03-20 DIAGNOSIS — I6501 Occlusion and stenosis of right vertebral artery: Secondary | ICD-10-CM | POA: Diagnosis not present

## 2020-03-20 DIAGNOSIS — I6522 Occlusion and stenosis of left carotid artery: Secondary | ICD-10-CM | POA: Diagnosis not present

## 2020-03-20 MED ORDER — GADOBENATE DIMEGLUMINE 529 MG/ML IV SOLN
17.0000 mL | Freq: Once | INTRAVENOUS | Status: AC | PRN
  Administered 2020-03-20: 17 mL via INTRAVENOUS

## 2020-03-20 NOTE — Telephone Encounter (Signed)
MRI angiogram reveals moderate stenosis/narrowing of the right vertebral artery.  This could be due to plaque buildup.

## 2020-03-21 DIAGNOSIS — F4321 Adjustment disorder with depressed mood: Secondary | ICD-10-CM | POA: Diagnosis not present

## 2020-03-21 DIAGNOSIS — M9902 Segmental and somatic dysfunction of thoracic region: Secondary | ICD-10-CM | POA: Diagnosis not present

## 2020-03-21 DIAGNOSIS — I2510 Atherosclerotic heart disease of native coronary artery without angina pectoris: Secondary | ICD-10-CM

## 2020-03-21 DIAGNOSIS — M7541 Impingement syndrome of right shoulder: Secondary | ICD-10-CM | POA: Diagnosis not present

## 2020-03-21 DIAGNOSIS — M9901 Segmental and somatic dysfunction of cervical region: Secondary | ICD-10-CM | POA: Diagnosis not present

## 2020-03-21 DIAGNOSIS — M19011 Primary osteoarthritis, right shoulder: Secondary | ICD-10-CM | POA: Diagnosis not present

## 2020-03-21 DIAGNOSIS — E875 Hyperkalemia: Secondary | ICD-10-CM

## 2020-03-22 DIAGNOSIS — E785 Hyperlipidemia, unspecified: Secondary | ICD-10-CM | POA: Diagnosis not present

## 2020-03-22 DIAGNOSIS — I2510 Atherosclerotic heart disease of native coronary artery without angina pectoris: Secondary | ICD-10-CM | POA: Diagnosis not present

## 2020-03-22 DIAGNOSIS — I10 Essential (primary) hypertension: Secondary | ICD-10-CM | POA: Diagnosis not present

## 2020-03-22 DIAGNOSIS — Z9861 Coronary angioplasty status: Secondary | ICD-10-CM | POA: Diagnosis not present

## 2020-03-22 LAB — LIPID PANEL
Chol/HDL Ratio: 1.9 ratio (ref 0.0–5.0)
Cholesterol, Total: 134 mg/dL (ref 100–199)
HDL: 72 mg/dL (ref >39)
LDL Chol Calc (NIH): 52 mg/dL (ref 0–99)
Triglycerides: 42 mg/dL (ref 0–149)
VLDL Cholesterol Cal: 10 mg/dL (ref 5–40)

## 2020-03-22 LAB — COMPREHENSIVE METABOLIC PANEL
ALT: 16 IU/L (ref 0–44)
AST: 27 IU/L (ref 0–40)
Albumin/Globulin Ratio: 2 (ref 1.2–2.2)
Albumin: 4.6 g/dL (ref 3.8–4.8)
Alkaline Phosphatase: 86 IU/L (ref 44–121)
BUN/Creatinine Ratio: 17 (ref 10–24)
BUN: 16 mg/dL (ref 8–27)
Bilirubin Total: 0.5 mg/dL (ref 0.0–1.2)
CO2: 27 mmol/L (ref 20–29)
Calcium: 9.4 mg/dL (ref 8.6–10.2)
Chloride: 103 mmol/L (ref 96–106)
Creatinine, Ser: 0.93 mg/dL (ref 0.76–1.27)
GFR calc Af Amer: 101 mL/min/{1.73_m2} (ref >59)
GFR calc non Af Amer: 88 mL/min/{1.73_m2} (ref >59)
Globulin, Total: 2.3 g/dL (ref 1.5–4.5)
Glucose: 98 mg/dL (ref 65–99)
Potassium: 5.6 mmol/L — ABNORMAL HIGH (ref 3.5–5.2)
Sodium: 140 mmol/L (ref 134–144)
Total Protein: 6.9 g/dL (ref 6.0–8.5)

## 2020-03-24 NOTE — Telephone Encounter (Signed)
Lipid panel looks great!!!  [name], MD

## 2020-03-24 NOTE — Telephone Encounter (Signed)
MRI shows that the right vertebral artery is not clearly small and nondominant.  There is probably moderate diffuse disease throughout.  The Dopplers and suggested possible occlusion, this clearly says is not occluded.  Open but very small and diffusely diseased.  Probably not a very vital artery.  [name], MD

## 2020-03-26 ENCOUNTER — Other Ambulatory Visit: Payer: Self-pay | Admitting: Adult Health

## 2020-03-26 MED ORDER — AMPHETAMINE-DEXTROAMPHETAMINE 30 MG PO TABS: 30.0000 mg | ORAL_TABLET | Freq: Every day | ORAL | 0 refills | Status: DC

## 2020-03-26 NOTE — Telephone Encounter (Signed)
Thank you!  Phillip [name]

## 2020-03-26 NOTE — Addendum Note (Signed)
Addended by: Brandon Melnick on: 03/26/2020 02:19 PM   Modules accepted: Orders

## 2020-03-26 NOTE — Telephone Encounter (Signed)
[name] is on [place]. She called to put in a refill request for pt. for amphetamine-dextroamphetamine (ADDERALL) 30 MG tablet.  Pharmacy: [id number]

## 2020-03-28 ENCOUNTER — Encounter: Payer: Self-pay | Admitting: Adult Health

## 2020-03-28 ENCOUNTER — Encounter: Payer: Self-pay | Admitting: Family Medicine

## 2020-03-28 ENCOUNTER — Ambulatory Visit: Payer: BC Managed Care – PPO | Admitting: Adult Health

## 2020-04-02 ENCOUNTER — Other Ambulatory Visit: Payer: Self-pay | Admitting: Family Medicine

## 2020-04-02 MED ORDER — ZOLPIDEM TARTRATE 10 MG PO TABS: 10.0000 mg | ORAL_TABLET | Freq: Every evening | ORAL | 3 refills | Status: DC | PRN

## 2020-04-02 NOTE — Progress Notes (Deleted)
PATIENT: Phillip [name] DOB: 09/24/1955  REASON FOR VISIT: follow up HISTORY FROM: patient  HISTORY OF PRESENT ILLNESS: Today 04/02/20:  09/26/19: Phillip [name] is a 64 year old male with a history of insomnia and ADHD.  He returns today for follow-up.  He has reduced Ativan to 0.5 mg at bedtime.  Reports that this continues to work fairly well for him.  He would like to further reduce his dose.  He continues on Adderall 60 mg daily.  Reports that he also switched his diet to a plant-based diet.  He returns today for an evaluation.  HISTORY 03/29/19:  Phillip [name] is a 64 year old male with a history of insomnia and ADHD.  He returns today for follow-up.  He continues on Adderall 60 mg daily.  He reports that this continues to work well for him.  He has continue trying to wean off of Ativan.  He is now taking 1 1/2 mg alternating with 1 milligram every other night.  He states that this is working well for him.  He would like to continue to decrease his dose.  When he had his sleep study in 2014 he did have  periodic limb movement disorder.  He was tried on Requip but reports that it caused confusion.  Since then he has not been on any other medication.  He returns today for an evaluation.  REVIEW OF SYSTEMS: Out of a complete 14 system review of symptoms, the patient complains only of the following symptoms, and all other reviewed systems are negative.  See HPI  ALLERGIES: Allergies  Allergen Reactions  . Penicillins Hives    Did it involve swelling of the face/tongue/throat, SOB, or low BP? Unknown Did it involve sudden or severe rash/hives, skin peeling, or any reaction on the inside of your mouth or nose? Yes Did you need to seek medical attention at a hospital or doctor's office? Unknown When did it last happen?Occurred at age 55 or 5 If all above answers are "NO", may proceed with cephalosporin use.   . Statins Other (See Comments)    confusion  . Methylprednisolone Other (See  Comments)    Unsure of exact reaction type  . Amphetamines Other (See Comments)    Intolerant to specific formulations: [employer] amphetamine TEVA dextroamp amphetamine  . Lorazepam Other (See Comments)    Mylan lorazepam; others ok  . Propofol Other (See Comments)    Cognitive delay and emotional    HOME MEDICATIONS: Outpatient Medications Prior to Visit  Medication Sig Dispense Refill  . amLODipine (NORVASC) 5 MG tablet TAKE 1 TABLET BY MOUTH EVERY DAY 90 tablet 3  . amphetamine-dextroamphetamine (ADDERALL) 30 MG tablet Take 1-2 tablets by mouth daily. 60 tablet 0  . Ascorbic Acid (VITAMIN C PO) Take 1 tablet by mouth 2 (two) times daily.    . B Complex-C (B-COMPLEX WITH VITAMIN C) tablet Take 1 tablet by mouth daily.    . Cholecalciferol 125 MCG (5000 UT) TABS     . clopidogrel (PLAVIX) 75 MG tablet TAKE 1 TABLET BY MOUTH EVERY DAY WITH BREAKFAST 90 tablet 2  . Coenzyme Q10 (COQ10) 100 MG CAPS Take 100 mg by mouth 2 (two) times daily.    . Evolocumab (REPATHA SURECLICK) 625 MG/ML SOAJ Inject 140 mg into the skin every 14 (fourteen) days. 2 pen 11  . Menaquinone-7 (VITAMIN K2) 100 MCG CAPS     . Misc Natural Products (PROSTATE SUPPORT PO) Take 1 capsule by mouth every evening. Prostate Plus    .  nitroGLYCERIN (NITROSTAT) 0.4 MG SL tablet PLACE 1 TABLET UNDER TONGUE EVERY 5 MINS AS NEEDED FOR CHEST PAIN 25 tablet 6  . Omega-3 Fatty Acids (SUPER OMEGA 3 EPA/DHA PO) Take 1 capsule by mouth daily.    . Probiotic Product ([facility] PROBIOTIC) CAPS     . sertraline (ZOLOFT) 25 MG tablet Take 2 tablets (50 mg total) by mouth daily. 180 tablet 3  . testosterone cypionate (DEPOTESTOSTERONE CYPIONATE) 200 MG/ML injection Inject 0.3 ML once a week 10 mL 2  . Zinc Acetate (GALZIN) 25 MG CAPS Take by mouth.    . zolpidem (AMBIEN) 10 MG tablet Take 1 tablet (10 mg total) by mouth at bedtime as needed for sleep. 30 tablet 0   No facility-administered medications prior to visit.    PAST MEDICAL  HISTORY: Past Medical History:  Diagnosis Date  . ADD (attention deficit disorder)    on adderrall managed by neurology  . Anxiety    on ativan managed by Neurology  . 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 CAD S/P percutaneous coronary angioplasty 06/2017   pLAD1 55% (not significant), p-mLAD2 80% & mLAD 70% (tandem lesions - DES PCI . Synergy DES 2.75 x 38 -- 3.0 mm).  mCx ~60% - med Rx, mRCA 30% & 40%.  ER 55-60%.  . Cellulitis   . Chronic traumatic encephalopathy    right frontal-temproal lobe  . Complication of anesthesia    " BRAIN FOG "  . Coronary artery calcification seen on CAT scan 05/12/2018   Triple- by CT - lung ca screen 04/2018  Cardio 06/2018: Coronary calcium score results shows aortic atherosclerosis (with mild dilation) as well as left main and three-vessel coronary artery calcification. The calcium score is 1168 which is very high risk amount of calcium.  Based on these findings, I would recommend proceeding with a coronary CT angiogram (a more detailed study with Intravenous X  . Depression   . Erectile dysfunction 11/2016   follows with urology; sildenafil prescribed  . Meningoencephalitis 1994   [place]  . Pneumonia   . Primary hypogonadism in male   . Raynaud's disease 51/70/0174   As complication of RMSF  . Restless legs 09/13/2015  . [place] spotted fever 2017  . TBI (traumatic brain injury) ([place]) 1997, 2009   "concussions"    PAST SURGICAL HISTORY: Past Surgical History:  Procedure Laterality Date  . CORONARY STENT INTERVENTION N/A 06/30/2018   Procedure: CORONARY STENT INTERVENTION;  Surgeon: [name], MD;  [facility];;  p-mLAD2 80% & mLAD 70% (tandem lesions) - DES PCI Synergy DES 2.75 x 38 (3.0 mm).  . CT CTA CORONARY W/CA SCORE W/CM &/OR WO/CM  06/2018   Cardiac cath score read 1318.  Moderate coronary disease sent for FFR: mRCA 0.84  (non-significant), mLAD 0.64 (signifiant), pLOM(Cx) - 0,72 (significant - but distal lesion)  . LEFT HEART CATH AND CORONARY ANGIOGRAPHY N/A 06/30/2018   Procedure: LEFT HEART CATH AND CORONARY ANGIOGRAPHY;  Surgeon: [name], MD;  [facility];;  pLAD1 55% (not significant), p-mLAD2 80% & mLAD 70% (tandem lesions -> DES PCI). mCx ~60% (med Rx), mRCA 30% & 40%.  ER 55-60%.   . TEE WITHOUT CARDIOVERSION N/A 09/06/2015   Procedure: TRANSESOPHAGEAL ECHOCARDIOGRAM (TEE);  Surgeon: Jerline Pain, MD;  R/O endocarditis;  . TONSILLECTOMY  1965    FAMILY HISTORY: Family  History  Problem Relation Age of Onset  . Hyperlipidemia Father   . Heart disease Maternal Grandfather 75  . Cancer Paternal Grandfather   . Mental illness Mother   . Mental illness Brother     SOCIAL HISTORY: Social History   Socioeconomic History  . Marital status: Divorced    Spouse name: [name]  . Number of children: 2  . Years of education: 58  . Highest education level: Not on file  Occupational History  . Occupation: Retired  Tobacco Use  . Smoking status: Former Smoker    Packs/day: 1.50    Years: 25.00    Pack years: 37.50    Types: Cigarettes    Quit date: 11/22/2014    Years since quitting: 5.3  . Smokeless tobacco: Never Used  . Tobacco comment: Encouraged to remain smoke free  Vaping Use  . Vaping Use: Never used  Substance and Sexual Activity  . Alcohol use: No    Alcohol/week: 0.0 standard drinks  . Drug use: No  . Sexual activity: Yes    Partners: Female  Other Topics Concern  . Not on file  Social History Narrative   Divorced. B.A. degree. Retired.   Drinks caffeine, uses herbal remedies, takes a daily vitamin.   Wears his seatbelt.  Smoke detector in the home.   Firearms locked in the home.   Feels safe in relationships.       He now has switched to a vegan diet.  Notably increase exercise level.   Social Determinants of Health   Financial Resource Strain:   .  Difficulty of Paying Living Expenses: Not on file  Food Insecurity:   . Worried About Charity fundraiser in the Last Year: Not on file  . Ran Out of Food in the Last Year: Not on file  Transportation Needs:   . Lack of Transportation (Medical): Not on file  . Lack of Transportation (Non-Medical): Not on file  Physical Activity:   . Days of Exercise per Week: Not on file  . Minutes of Exercise per Session: Not on file  Stress:   . Feeling of Stress : Not on file  Social Connections:   . Frequency of Communication with Friends and Family: Not on file  . Frequency of Social Gatherings with Friends and Family: Not on file  . Attends Religious Services: Not on file  . Active Member of Clubs or Organizations: Not on file  . Attends Archivist Meetings: Not on file  . Marital Status: Not on file  Intimate Partner Violence:   . Fear of Current or Ex-Partner: Not on file  . Emotionally Abused: Not on file  . Physically Abused: Not on file  . Sexually Abused: Not on file      PHYSICAL EXAM  There were no vitals filed for this visit. There is no height or weight on file to calculate BMI.  Generalized: Well developed, in no acute distress   Neurological examination  Mentation: Alert oriented to time, place, history taking. Follows all commands speech and language fluent Cranial nerve II-XII: Pupils were equal round reactive to light. Extraocular movements were full, visual field were full on confrontational test.  Head turning and shoulder shrug  were normal and symmetric. Motor: The motor testing reveals 5 over 5 strength of all 4 extremities. Good symmetric motor tone is noted throughout.  Sensory: Sensory testing is intact to soft touch on all 4 extremities. No evidence of extinction is noted.  Coordination:  Cerebellar testing reveals good finger-nose-finger and heel-to-shin bilaterally.  Gait and station: Gait is normal Reflexes: Deep tendon reflexes are symmetric and  normal bilaterally.   DIAGNOSTIC DATA (LABS, IMAGING, TESTING) - I reviewed patient records, labs, notes, testing and imaging myself where available.  Lab Results  Component Value Date   WBC 3.9 07/08/2019   HGB 14.3 07/08/2019   HCT 43.5 07/08/2019   MCV 96 07/08/2019   PLT 269 07/08/2019      Component Value Date/Time   NA 140 03/22/2020 0915   K 5.6 (H) 03/22/2020 0915   CL 103 03/22/2020 0915   CO2 27 03/22/2020 0915   GLUCOSE 98 03/22/2020 0915   GLUCOSE 108 (H) 06/08/2019 1819   BUN 16 03/22/2020 0915   CREATININE 0.93 03/22/2020 0915   CREATININE 0.71 01/03/2015 1339   CALCIUM 9.4 03/22/2020 0915   PROT 6.9 03/22/2020 0915   ALBUMIN 4.6 03/22/2020 0915   AST 27 03/22/2020 0915   ALT 16 03/22/2020 0915   ALKPHOS 86 03/22/2020 0915   BILITOT 0.5 03/22/2020 0915   GFRNONAA 88 03/22/2020 0915   GFRNONAA >89 04/12/2014 1722   GFRAA 101 03/22/2020 0915   GFRAA >89 04/12/2014 1722   Lab Results  Component Value Date   CHOL 134 03/22/2020   HDL 72 03/22/2020   LDLCALC 52 03/22/2020   TRIG 42 03/22/2020   CHOLHDL 1.9 03/22/2020   Lab Results  Component Value Date   HGBA1C 5.0 07/08/2019    Lab Results  Component Value Date   TSH 2.67 12/15/2017      ASSESSMENT AND PLAN 64 y.o. year old male  has a past medical history of ADD (attention deficit disorder), Anxiety, Atrophy, cortical (2017), Bartonella infection (2017), CAD S/P percutaneous coronary angioplasty (06/2017), Cellulitis, Chronic traumatic encephalopathy, Complication of anesthesia, Coronary artery calcification seen on CAT scan (05/12/2018), Depression, Erectile dysfunction (11/2016), Meningoencephalitis (1994), Pneumonia, Primary hypogonadism in male, Raynaud's disease (09/13/2015), Restless legs (09/13/2015), [facility] spotted fever (2017), and TBI (traumatic brain injury) ([place]) (1997, 2009). here with:  ADHD   Continue Adderall 60 mg daily  Insomnia   Continue Ativan to 0.25 mg at  bedtime  Advised if symptoms worsen or he develops new symptoms he should let us know.  Follow-up in 6 months or sooner if needed   I spent 20 minutes of face-to-face and non-face-to-face time with patient.  This included previsit chart review, lab review, study review, order entry, electronic health record documentation, patient education.  [name], MSN, NP-C 04/02/2020, 3:59 PM [facility] [address] [place], [place] [address] [phone]

## 2020-04-03 ENCOUNTER — Ambulatory Visit: Payer: BC Managed Care – PPO | Admitting: Adult Health

## 2020-04-03 ENCOUNTER — Encounter: Payer: Self-pay | Admitting: Adult Health

## 2020-04-04 ENCOUNTER — Telehealth: Payer: Self-pay | Admitting: *Deleted

## 2020-04-04 ENCOUNTER — Ambulatory Visit: Payer: BC Managed Care – PPO

## 2020-04-04 ENCOUNTER — Ambulatory Visit: Payer: BC Managed Care – PPO | Admitting: Adult Health

## 2020-04-04 ENCOUNTER — Encounter: Payer: Self-pay | Admitting: Adult Health

## 2020-04-04 ENCOUNTER — Other Ambulatory Visit: Payer: Self-pay

## 2020-04-04 ENCOUNTER — Other Ambulatory Visit: Payer: Self-pay | Admitting: Internal Medicine

## 2020-04-04 VITALS — BP 139/85 | HR 70 | Ht 73.0 in | Wt 182.6 lb

## 2020-04-04 DIAGNOSIS — F5104 Psychophysiologic insomnia: Secondary | ICD-10-CM

## 2020-04-04 DIAGNOSIS — F901 Attention-deficit hyperactivity disorder, predominantly hyperactive type: Secondary | ICD-10-CM | POA: Diagnosis not present

## 2020-04-04 NOTE — Progress Notes (Signed)
PATIENT: Phillip [name] DOB: [birth year]  REASON FOR VISIT: follow up HISTORY FROM: patient  HISTORY OF PRESENT ILLNESS: Today 04/04/20:  Phillip [name] is a 64 year old male with a history of insomnia and ADHD.  He returns today for follow-up.  He continues on Adderall 60 mg daily.  Reports that this continues to work well for him.  In the past he was on Ativan for insomnia however he no longer takes this.  He was recently prescribed Ambien by Dr. Junius Roads for insomnia.  Reports this is working well for his insomnia.  He also mentioned to Dr. Karren Burly he would like a second opinion in regards to his sleep.  He states that now that his girlfriend sleeps with him she notes that he thrashes about in bed a lot.  He would like a repeat sleep [name].  09/26/19: Phillip [name] is a 64 year old male with a history of insomnia and ADHD.  He returns today for follow-up.  He has reduced Ativan to 0.5 mg at bedtime.  Reports that this continues to work fairly well for him.  He would like to further reduce his dose.  He continues on Adderall 60 mg daily.  Reports that he also switched his diet to a plant-based diet.  He returns today for an evaluation.  HISTORY 03/29/19:  Phillip [name] is a 64 year old male with a history of insomnia and ADHD.  He returns today for follow-up.  He continues on Adderall 60 mg daily.  He reports that this continues to work well for him.  He has continue trying to wean off of Ativan.  He is now taking 1 1/2 mg alternating with 1 milligram every other night.  He states that this is working well for him.  He would like to continue to decrease his dose.  When he had his sleep [name] in 2014 he did have  periodic limb movement disorder.  He was tried on Requip but reports that it caused confusion.  Since then he has not been on any other medication.  He returns today for an evaluation.  REVIEW OF SYSTEMS: Out of a complete 14 system review of symptoms, the patient complains only of the following  symptoms, and all other reviewed systems are negative.  See HPI  ALLERGIES: Allergies  Allergen Reactions  . Penicillins Hives    Did it involve swelling of the face/tongue/throat, SOB, or low BP? Unknown Did it involve sudden or severe rash/hives, skin peeling, or any reaction on the inside of your mouth or nose? Yes Did you need to seek medical attention at a hospital or doctor's office? Unknown When did it last happen?Occurred at age 77 or 70 If all above answers are "NO", may proceed with cephalosporin use.   . Statins Other (See Comments)    confusion  . Methylprednisolone Other (See Comments)    Unsure of exact reaction type  . Amphetamines Other (See Comments)    Intolerant to specific formulations: [employer] amphetamine TEVA dextroamp amphetamine  . Lorazepam Other (See Comments)    Mylan lorazepam; others ok  . Propofol Other (See Comments)    Cognitive delay and emotional    HOME MEDICATIONS: Outpatient Medications Prior to Visit  Medication Sig Dispense Refill  . amLODipine (NORVASC) 5 MG tablet TAKE 1 TABLET BY MOUTH EVERY DAY 90 tablet 3  . amphetamine-dextroamphetamine (ADDERALL) 30 MG tablet Take 1-2 tablets by mouth daily. 60 tablet 0  . Ascorbic Acid (VITAMIN C PO) Take 1 tablet by mouth 2 (  two) times daily.    . B Complex-C (B-COMPLEX WITH VITAMIN C) tablet Take 1 tablet by mouth daily.    . Cholecalciferol 125 MCG (5000 UT) TABS     . clopidogrel (PLAVIX) 75 MG tablet TAKE 1 TABLET BY MOUTH EVERY DAY WITH BREAKFAST 90 tablet 2  . Coenzyme Q10 (COQ10) 100 MG CAPS Take 100 mg by mouth 2 (two) times daily.    . Evolocumab (REPATHA SURECLICK) 998 MG/ML SOAJ Inject 140 mg into the skin every 14 (fourteen) days. 2 pen 11  . Menaquinone-7 (VITAMIN K2) 100 MCG CAPS     . Misc Natural Products (PROSTATE SUPPORT PO) Take 1 capsule by mouth every evening. Prostate Plus    . nitroGLYCERIN (NITROSTAT) 0.4 MG SL tablet PLACE 1 TABLET UNDER TONGUE EVERY 5 MINS AS NEEDED  FOR CHEST PAIN 25 tablet 6  . Omega-3 Fatty Acids (SUPER OMEGA 3 EPA/DHA PO) Take 1 capsule by mouth daily.    . Probiotic Product ([employer] PROBIOTIC) CAPS     . sertraline (ZOLOFT) 25 MG tablet Take 2 tablets (50 mg total) by mouth daily. 180 tablet 3  . testosterone cypionate (DEPOTESTOSTERONE CYPIONATE) 200 MG/ML injection Inject 0.3 ML once a week 10 mL 2  . Zinc Acetate (GALZIN) 25 MG CAPS Take by mouth.    . zolpidem (AMBIEN) 10 MG tablet Take 1 tablet (10 mg total) by mouth at bedtime as needed for sleep. 30 tablet 3   No facility-administered medications prior to visit.    PAST MEDICAL HISTORY: Past Medical History:  Diagnosis Date  . ADD (attention deficit disorder)    on adderrall managed by neurology  . Anxiety    on ativan managed by Neurology  . 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 CAD S/P percutaneous coronary angioplasty 06/2017   pLAD1 55% (not significant), p-mLAD2 80% & mLAD 70% (tandem lesions - DES PCI . Synergy DES 2.75 x 38 -- 3.0 mm).  mCx ~60% - med Rx, mRCA 30% & 40%.  ER 55-60%.  . Cellulitis   . Chronic traumatic encephalopathy    right frontal-temproal lobe  . Complication of anesthesia    " BRAIN FOG "  . Coronary artery calcification seen on CAT scan 05/12/2018   Triple- by CT - lung ca screen 04/2018  Cardio 06/2018: Coronary calcium score results shows aortic atherosclerosis (with mild dilation) as well as left main and three-vessel coronary artery calcification. The calcium score is 1168 which is very high risk amount of calcium.  Based on these findings, I would recommend proceeding with a coronary CT angiogram (a more detailed [name] with Intravenous X  . Depression   . Erectile dysfunction 11/2016   follows with urology; sildenafil prescribed  . Meningoencephalitis 1994   [place]  . Pneumonia   . Primary hypogonadism in male   . Raynaud's disease 33/82/5053   As  complication of RMSF  . Restless legs 09/13/2015  . [place] spotted fever 2017  . TBI (traumatic brain injury) ([place]) 1997, 2009   "concussions"    PAST SURGICAL HISTORY: Past Surgical History:  Procedure Laterality Date  . CORONARY STENT INTERVENTION N/A 06/30/2018   Procedure: CORONARY STENT INTERVENTION;  Surgeon: [name], MD;  Location: [facility];;  p-mLAD2 80% & mLAD 70% (tandem lesions) - DES PCI Synergy DES 2.75 x 38 (3.0 mm).  . CT CTA CORONARY W/CA SCORE  W/CM &/OR WO/CM  06/2018   Cardiac cath score read 1318.  Moderate coronary disease sent for FFR: mRCA 0.84 (non-significant), mLAD 0.64 (signifiant), pLOM(Cx) - 0,72 (significant - but distal lesion)  . LEFT HEART CATH AND CORONARY ANGIOGRAPHY N/A 06/30/2018   Procedure: LEFT HEART CATH AND CORONARY ANGIOGRAPHY;  Surgeon: [name], MD;  Location: [facility];;  pLAD1 55% (not significant), p-mLAD2 80% & mLAD 70% (tandem lesions -> DES PCI). mCx ~60% (med Rx), mRCA 30% & 40%.  ER 55-60%.   . TEE WITHOUT CARDIOVERSION N/A 09/06/2015   Procedure: TRANSESOPHAGEAL ECHOCARDIOGRAM (TEE);  Surgeon: Jerline Pain, MD;  R/O endocarditis;  . TONSILLECTOMY  1965    FAMILY HISTORY: Family History  Problem Relation Age of Onset  . Hyperlipidemia Father   . Heart disease Maternal Grandfather 75  . Cancer Paternal Grandfather   . Mental illness Mother   . Mental illness Brother     SOCIAL HISTORY: Social History   Socioeconomic History  . Marital status: Divorced    Spouse name: [name]  . Number of children: 2  . Years of education: 58  . Highest education level: Not on file  Occupational History  . Occupation: Retired  Tobacco Use  . Smoking status: Former Smoker    Packs/day: 1.50    Years: 25.00    Pack years: 37.50    Types: Cigarettes    Quit date: 11/22/2014    Years since quitting: 5.3  . Smokeless tobacco: Never Used  . Tobacco comment: Encouraged to remain smoke free  Vaping Use   . Vaping Use: Never used  Substance and Sexual Activity  . Alcohol use: No    Alcohol/week: 0.0 standard drinks  . Drug use: No  . Sexual activity: Yes    Partners: Female  Other Topics Concern  . Not on file  Social History Narrative   Divorced. B.A. degree. Retired.   Drinks caffeine, uses herbal remedies, takes a daily vitamin.   Wears his seatbelt.  Smoke detector in the home.   Firearms locked in the home.   Feels safe in relationships.       He now has switched to a vegan diet.  Notably increase exercise level.   Social Determinants of Health   Financial Resource Strain:   . Difficulty of Paying Living Expenses: Not on file  Food Insecurity:   . Worried About Charity fundraiser in the Last Year: Not on file  . Ran Out of Food in the Last Year: Not on file  Transportation Needs:   . Lack of Transportation (Medical): Not on file  . Lack of Transportation (Non-Medical): Not on file  Physical Activity:   . Days of Exercise per Week: Not on file  . Minutes of Exercise per Session: Not on file  Stress:   . Feeling of Stress : Not on file  Social Connections:   . Frequency of Communication with Friends and Family: Not on file  . Frequency of Social Gatherings with Friends and Family: Not on file  . Attends Religious Services: Not on file  . Active Member of Clubs or Organizations: Not on file  . Attends Archivist Meetings: Not on file  . Marital Status: Not on file  Intimate Partner Violence:   . Fear of Current or Ex-Partner: Not on file  . Emotionally Abused: Not on file  . Physically Abused: Not on file  . Sexually Abused: Not on file  PHYSICAL EXAM  Vitals:   04/04/20 1506  BP: 139/85  Pulse: 70  Weight: 182 lb 9.6 oz (82.8 kg)  Height: 6\' 1"  (1.854 m)   Body mass index is 24.09 kg/m.  Generalized: Well developed, in no acute distress   Neurological examination  Mentation: Alert oriented to time, place, history taking. Follows all  commands speech and language fluent Cranial nerve II-XII: Pupils were equal round reactive to light. Extraocular movements were full, visual field were full on confrontational test.  Head turning and shoulder shrug  were normal and symmetric. Motor: The motor testing reveals 5 over 5 strength of all 4 extremities. Good symmetric motor tone is noted throughout.  Sensory: Sensory testing is intact to soft touch on all 4 extremities. No evidence of extinction is noted.  Coordination: Cerebellar testing reveals good finger-nose-finger and heel-to-shin bilaterally.  Gait and station: Gait is normal Reflexes: Deep tendon reflexes are symmetric and normal bilaterally.   DIAGNOSTIC DATA (LABS, IMAGING, TESTING) - I reviewed patient records, labs, notes, testing and imaging myself where available.  Lab Results  Component Value Date   WBC 3.9 07/08/2019   HGB 14.3 07/08/2019   HCT 43.5 07/08/2019   MCV 96 07/08/2019   PLT 269 07/08/2019      Component Value Date/Time   NA 140 03/22/2020 0915   K 5.6 (H) 03/22/2020 0915   CL 103 03/22/2020 0915   CO2 27 03/22/2020 0915   GLUCOSE 98 03/22/2020 0915   GLUCOSE 108 (H) 06/08/2019 1819   BUN 16 03/22/2020 0915   CREATININE 0.93 03/22/2020 0915   CREATININE 0.71 01/03/2015 1339   CALCIUM 9.4 03/22/2020 0915   PROT 6.9 03/22/2020 0915   ALBUMIN 4.6 03/22/2020 0915   AST 27 03/22/2020 0915   ALT 16 03/22/2020 0915   ALKPHOS 86 03/22/2020 0915   BILITOT 0.5 03/22/2020 0915   GFRNONAA 88 03/22/2020 0915   GFRNONAA >89 04/12/2014 1722   GFRAA 101 03/22/2020 0915   GFRAA >89 04/12/2014 1722   Lab Results  Component Value Date   CHOL 134 03/22/2020   HDL 72 03/22/2020   LDLCALC 52 03/22/2020   TRIG 42 03/22/2020   CHOLHDL 1.9 03/22/2020   Lab Results  Component Value Date   HGBA1C 5.0 07/08/2019    Lab Results  Component Value Date   TSH 2.67 12/15/2017      ASSESSMENT AND PLAN 64 y.o. year old male  has a past medical history  of ADD (attention deficit disorder), Anxiety, Atrophy, cortical (2017), Bartonella infection (2017), CAD S/P percutaneous coronary angioplasty (06/2017), Cellulitis, Chronic traumatic encephalopathy, Complication of anesthesia, Coronary artery calcification seen on CAT scan (05/12/2018), Depression, Erectile dysfunction (11/2016), Meningoencephalitis (1994), Pneumonia, Primary hypogonadism in male, Raynaud's disease (09/13/2015), Restless legs (09/13/2015), [facility] spotted fever (2017), and TBI (traumatic brain injury) ([place]) (1997, 2009). here with:  ADHD   Continue Adderall 60 mg daily  Insomnia   Recently started on Ambien by Dr. Junius Roads  Has an appointment for repeat sleep [name] with [facility] this month.  Spoke to the patient I will repeat the [name] here however he prefers to have a second opinion.  Advised if symptoms worsen or he develops new symptoms he should let us know.  Follow-up in 6 months or sooner if needed   I spent 20 minutes of face-to-face and non-face-to-face time with patient.  This included previsit chart review, lab review, [name] review, order entry, electronic health record documentation, patient education.  [name], MSN, NP-C  04/04/2020, 3:04 PM [facility] [address], [place], [place] [address] [phone]

## 2020-04-04 NOTE — Telephone Encounter (Signed)
LMVM for pt that had opening.cancellation for today at 1500, held if can make it.

## 2020-04-04 NOTE — Patient Instructions (Signed)
Your Plan:  Continue Adderall If your symptoms worsen or you develop new symptoms please let us know.    Thank you for coming to see [place] at [facility]. I hope we have been able to provide you high quality care today.  You may receive a patient satisfaction survey over the next few weeks. We would appreciate your feedback and comments so that we may continue to improve ourselves and the health of our patients.

## 2020-04-05 DIAGNOSIS — F4321 Adjustment disorder with depressed mood: Secondary | ICD-10-CM | POA: Diagnosis not present

## 2020-04-06 ENCOUNTER — Other Ambulatory Visit: Payer: BC Managed Care – PPO

## 2020-04-09 ENCOUNTER — Telehealth: Payer: Self-pay | Admitting: *Deleted

## 2020-04-09 NOTE — Telephone Encounter (Signed)
Received fax from [place] with authorization # [id number] for Dates of Service 04/03/20 with service of Sleep monitoring of patient in sleep lab, status certified in total. Certified at [facility]

## 2020-04-11 ENCOUNTER — Encounter: Payer: Self-pay | Admitting: Family Medicine

## 2020-04-11 MED ORDER — ZOLPIDEM TARTRATE 10 MG PO TABS
10.0000 mg | ORAL_TABLET | Freq: Every evening | ORAL | 3 refills | Status: DC | PRN
Start: 2020-04-11 — End: 2020-06-06

## 2020-04-12 ENCOUNTER — Other Ambulatory Visit: Payer: Self-pay

## 2020-04-12 ENCOUNTER — Other Ambulatory Visit (INDEPENDENT_AMBULATORY_CARE_PROVIDER_SITE_OTHER): Payer: BC Managed Care – PPO

## 2020-04-12 DIAGNOSIS — N529 Male erectile dysfunction, unspecified: Secondary | ICD-10-CM | POA: Diagnosis not present

## 2020-04-12 DIAGNOSIS — E230 Hypopituitarism: Secondary | ICD-10-CM

## 2020-04-16 ENCOUNTER — Other Ambulatory Visit: Payer: Self-pay | Admitting: Internal Medicine

## 2020-04-16 ENCOUNTER — Encounter: Payer: Self-pay | Admitting: Internal Medicine

## 2020-04-17 ENCOUNTER — Encounter: Payer: Self-pay | Admitting: Internal Medicine

## 2020-04-17 LAB — TESTOSTERONE, FREE AND TOTAL (INCLUDES SHBG)-(MALES)
% Free Testosterone: 0.4 %
Free Testosterone, S: 39 pg/mL — ABNORMAL LOW
Sex Hormone Binding Globulin: 95.7 nmol/L — ABNORMAL HIGH
Testosterone, Serum (Total): 966 ng/dL — ABNORMAL HIGH

## 2020-04-19 DIAGNOSIS — F4321 Adjustment disorder with depressed mood: Secondary | ICD-10-CM | POA: Diagnosis not present

## 2020-04-23 ENCOUNTER — Other Ambulatory Visit: Payer: Self-pay

## 2020-04-23 ENCOUNTER — Ambulatory Visit (INDEPENDENT_AMBULATORY_CARE_PROVIDER_SITE_OTHER): Payer: BC Managed Care – PPO | Admitting: Internal Medicine

## 2020-04-23 ENCOUNTER — Encounter: Payer: Self-pay | Admitting: Internal Medicine

## 2020-04-23 VITALS — BP 148/100 | HR 81 | Ht 73.0 in | Wt 181.4 lb

## 2020-04-23 DIAGNOSIS — N529 Male erectile dysfunction, unspecified: Secondary | ICD-10-CM | POA: Diagnosis not present

## 2020-04-23 DIAGNOSIS — E230 Hypopituitarism: Secondary | ICD-10-CM

## 2020-04-23 MED ORDER — TESTOSTERONE 50 MG/5GM (1%) TD GEL: 5.0000 g | Freq: Every day | TRANSDERMAL | 3 refills | Status: DC

## 2020-04-23 NOTE — Patient Instructions (Signed)
Please change from testosterone injections to gel - 5 g daily at night.  Please come back for labs fasting, between 8-9 am, in 6-8 weeks.  Please come back for a follow-up appointment in 1 year.

## 2020-04-23 NOTE — Progress Notes (Signed)
Patient ID: Phillip [name], male   DOB: 02/14/1956, 64 y.o.   MRN: [id number]   This visit occurred during the SARS-CoV-2 public health emergency.  Safety protocols were in place, including screening questions prior to the visit, additional usage of staff PPE, and extensive cleaning of exam room while observing appropriate contact time as indicated for disinfecting solutions.   HPI: Phillip [name] is a 64 y.o.-year-old man, initially referred by his neurologist, Dr. [name], presenting for follow-up for hypogonadism.  Last visit 5 months ago.  Before last visit, he started on the [place] diet in 06/2019: Diet, exercise, stress management.  He joined the program in [place], initially commuting there, but afterwards going only sporadically.  Currently, he is following the diet at home, without going in in person.  He is doing a good job with it.  We did try to reduce the dose further to 0.2 mg weekly but he did not tolerate this well due to fatigue.  At this visit, he continues on 0.3 mL testosterone weekly.  Latest free testosterone level was slightly low x2.  His SHBG was quite high.  At this visit, he tells me that he feels that his libido is variable, almost nonexistent 1 day and very prominent the next, without clear relationship with the injection time.  He has ED and is on PDE 5 inhibitors prescribed by Dr. Diona Fanti with urology.  Before last visit, he started Adderall.  He had insomnia, memory loss, AMS.  At this visit, he tells me that insomnia and memory loss persists.  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  He was given refills of generic sildenafil. 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 He had a subsequent visit with his PCP in 09/2015 >> a testosterone level was found to be extremely low.   Received records from urology >> reviewed: Patient's initial free testosterone level obtained by PCP was 0.4 pg/mL (09/17/2015). At that time, he complained of low libido, testicular atrophy, difficulty obtaining and maintaining an erection. A repeat testosterone level obtained by an integrative medicine provider in [place] (09/25/2015) showed again very low testosterone level: Total Testosterone <3 ng/dL, and the free testosterone of 0.2 ng/dL. DHEAS was 122.4 (48.9-344.2). Of note, no LH and FSH levels were drawn at that time. At that point, he was started on testosterone cypionate 8 IM 0.8 mL every other week. This has not improved his libido significantly, but it helped him obtaining morning erections.  A genital exam was performed on 11/09/2015 by his urologist: Atrophic left and right testes. No tenderness, swelling, masses, varicocele, or hydrocele. No abnormality of the penis. Circumcised status. Prostate: 2+ in size, 40 g. Normal consistency  Dr. Diona Fanti obtain the following labs - 11/09/2015 - collection time: 3:34 PM  Total testosterone 1125.7 (300-890) Free testosterone 175.3 (47-244) SHBG 66.3 (10-57)  FSH 0.1, LH 0.1 Prolactin 6.3 Of note, at the time of the above collection, patient was taking 0.8 mL testosterone every other week in his deltoid. The dose was changed at that time to 0.5 mL every week in his quadriceps, after which his fatigue improved a little, and  he had more energy.   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 He saw Dr. [name], who obtained a  brain  + pituitary MRI (10/24/2015). This showed chronic encephalomalacia in the right fronto- temporal lobe and mild generalized cortical atrophy with scattered hyperintense foci  that indicate chronic microvascular change. There was no abnormality in the pituitary gland.  He admits for previous decreased libido >> normal now Had difficulty obtaining or maintaining an erection >> not anymore No trauma to testes, testicular irradiation or surgery No h/o of mumps orchitis/h/o autoimmune ds. No h/o cryptorchidism He grew and went through puberty like his peers + shrinking of testes. No very small testes (<5 ml) No incomplete/delayed sexual development     No breast discomfort/gynecomastia    No loss of body hair (axillary/pubic)/decreased need for shaving No height loss No abnormal sense of smell  + hot flushes, resolved No vision problems, other than blurry vision in R eye, also photopsia - started 11/2015 No worst HA of his life He does have a history of head trauma in 1997, when a tree fell on top of his head and he lost consciousness for more than 30 minutes; in 2009 he fell off ladder and landed on back of his head No FH of hypogonadism/infertility No personal h/o infertility - has 2 children: 23 and 61 years old  No FH of hemochromatosis or pituitary tumors No excessive weight gain or loss.  No chronic diseases, but recently diagnosed with RMSF Spring 2017 No chronic pain. Not on opiates, does not take steroids, had a course this spring - ~1 week He was drinking Alcohol: ~2 drinks a day up to 2015 >>  stopped completely since then No anabolic steroids use No herbal medicines. Not on antidepressants  No AI ds in his family, no FH of MS. He does not have family history of early cardiac disease.  Reviewed pertinent labs:  Component     Latest Ref Rng & Units 02/10/2020 04/12/2020  Testosterone, Serum (Total)     ng/dL 749 966 (H)  % Free Testosterone     % 0.6 0.4  Free Testosterone, S     pg/mL 45 (L) 39 (L)  Sex Hormone Binding Globulin     nmol/L 78.1 (H) 95.7 (H)   Component     Latest Ref Rng & Units 10/25/2018  Testosterone, Serum (Total)      ng/dL 813  % Free Testosterone     % 1.3  Free Testosterone, S     pg/mL 106  Sex Hormone Binding Globulin     nmol/L 59.2  Estradiol, Free     pg/mL 0.46 (H)  Estradiol     ADULTS: < OR = 0.45 pg/mL 22  PSA     0.10 - 4.00 ng/mL 0.51   Component     Latest Ref Rng & Units 07/20/2017 10/12/2017 12/22/2017  Testosterone, Serum (Total)     ng/dL  718   % Free Testosterone     %  0.8   Free Testosterone, S     pg/mL  57   Sex Hormone Binding Globulin     nmol/L  83.5 (H)   Testosterone     264 - 916 ng/dL 350    Testosterone Free     7.2 - 24.0 pg/mL 4.1 (L)    Sex Horm Binding Glob, Serum     19.3 - 76.4 nmol/L 100.7 (H)    Estradiol, Free  pg/mL   0.38  Estradiol     pg/mL   19   Initially: Component     Latest Ref Rng & Units 01/28/2016  Testosterone     264 - 916 ng/dL 1,103 (H)  Testosterone Free     7.2 - 24.0 pg/mL 15.1  Sex Horm Binding Glob, Serum     19.3 - 76.4 nmol/L 80.8 (H)  TSH     0.35 - 4.50 uIU/mL 1.90  Triiodothyronine,Free,Serum     2.3 - 4.2 pg/mL 3.9  T4,Free(Direct)     0.60 - 1.60 ng/dL 0.80  FSH     1.4 - 18.1 mIU/ML 0.1 (L)  Cortisol, Plasma     ug/dL 13.1  LH     1.50 - 9.30 mIU/mL 0.03 (L)  hCG Quant     0 - 3 mIU/mL <1   Component     Latest Ref Rng & Units 01/28/2016  IGF-I, LC/MS     50 - 317 ng/mL 140  Z-Score (Male)     -2.0 - 2.0 SD 0.1  Estradiol, Free     ADULTS: < OR = 0.45 pg/mL 1.06 (H)  Estradiol     ADULTS: < OR = 29 pg/mL 53 (H)  Prolactin     2.0 - 18.0 ng/mL 8.1   PSA levels were normal: Lab Results  Component Value Date   PSA 0.51 02/10/2020   PSA 0.51 10/25/2018   PSA 0.80 12/22/2017   PSA 0.59 12/19/2016   PSA 0.72 12/10/2016   PSA 0.57 04/12/2014   PSA 0.57 03/16/2013   His hematocrit was normal at last check: Lab Results  Component Value Date   HCT 43.5 07/08/2019   Patient has a history of a coronary stent placed in 06/2018 - after he had an elevated CAC score, after which he had to  come off testosterone transiently.  With cardiology consent, we restarted testosterone supplement at 0.3 mL weekly as he felt poorly off testosterone.  He was in the emergency room 06/2019 atypical chest pain.  He was started on Repatha 07/2019.  He takes Ambien to help him sleep.  ROS:   Constitutional: no weight gain/no weight loss, + fatigue, no subjective hyperthermia, no subjective hypothermia, + insomnia (periodic limb movement) Eyes: no blurry vision, no xerophthalmia ENT: no sore throat, no nodules palpated in neck, no dysphagia, no odynophagia, no hoarseness Cardiovascular: no CP/no SOB/no palpitations/no leg swelling Respiratory: no cough/no SOB/no wheezing Gastrointestinal: no N/no V/no D/no C/no acid reflux Musculoskeletal: no muscle aches/no joint aches Skin: no rashes, no hair loss Neurological: no tremors/no numbness/no tingling/no dizziness  I reviewed pt's medications, allergies, PMH, social hx, family hx, and changes were documented in the history of present illness. Otherwise, unchanged from my initial visit note.  Past Medical History:  Diagnosis Date  . ADD (attention deficit disorder)    on adderrall managed by neurology  . Anxiety    on ativan managed by Neurology  . 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 CAD S/P percutaneous coronary angioplasty 06/2017   pLAD1 55% (not significant), p-mLAD2 80% & mLAD 70% (tandem lesions - DES PCI . Synergy DES 2.75 x 38 -- 3.0 mm).  mCx ~60% - med Rx, mRCA 30% & 40%.  ER 55-60%.  . Cellulitis   . Chronic traumatic encephalopathy    right frontal-temproal lobe  . Complication of anesthesia    "  BRAIN FOG "  . Coronary artery calcification seen on CAT scan 05/12/2018   Triple- by CT - lung ca screen 04/2018  Cardio 06/2018: Coronary calcium score results shows aortic atherosclerosis (with mild dilation) as well as left main and three-vessel  coronary artery calcification. The calcium score is 1168 which is very high risk amount of calcium.  Based on these findings, I would recommend proceeding with a coronary CT angiogram (a more detailed study with Intravenous X  . Depression   . Erectile dysfunction 11/2016   follows with urology; sildenafil prescribed  . Meningoencephalitis 1994   [place]  . Pneumonia   . Primary hypogonadism in male   . Raynaud's disease 23/30/0762   As complication of RMSF  . Restless legs 09/13/2015  . [place] spotted fever 2017  . TBI (traumatic brain injury) ([place]) 1997, 2009   "concussions"   Past Surgical History:  Procedure Laterality Date  . CORONARY STENT INTERVENTION N/A 06/30/2018   Procedure: CORONARY STENT INTERVENTION;  Surgeon: [name], MD;  Location: [facility];;  p-mLAD2 80% & mLAD 70% (tandem lesions) - DES PCI Synergy DES 2.75 x 38 (3.0 mm).  . CT CTA CORONARY W/CA SCORE W/CM &/OR WO/CM  06/2018   Cardiac cath score read 1318.  Moderate coronary disease sent for FFR: mRCA 0.84 (non-significant), mLAD 0.64 (signifiant), pLOM(Cx) - 0,72 (significant - but distal lesion)  . LEFT HEART CATH AND CORONARY ANGIOGRAPHY N/A 06/30/2018   Procedure: LEFT HEART CATH AND CORONARY ANGIOGRAPHY;  Surgeon: [name], MD;  Location: [facility];;  pLAD1 55% (not significant), p-mLAD2 80% & mLAD 70% (tandem lesions -> DES PCI). mCx ~60% (med Rx), mRCA 30% & 40%.  ER 55-60%.   . TEE WITHOUT CARDIOVERSION N/A 09/06/2015   Procedure: TRANSESOPHAGEAL ECHOCARDIOGRAM (TEE);  Surgeon: Jerline Pain, MD;  R/O endocarditis;  . TONSILLECTOMY  1965   Social History   Social History  . Marital status: Single    Spouse name: N/A  . Number of children: 2   Occupational History  . n/a   Social History Main Topics  . Smoking status: Former Smoker    Packs/day: 1.50    Years: 25.00    Types: Cigarettes    Quit date: 2002  . Smokeless tobacco: Never Used     Comment:  Encouraged to remain smoke free  . Alcohol use No     Comment: 8 drinks  . Drug use: No   Social History Narrative   Divorced. Education: [employer].   Current Outpatient Medications on File Prior to Visit  Medication Sig Dispense Refill  . amLODipine (NORVASC) 5 MG tablet TAKE 1 TABLET BY MOUTH EVERY DAY 90 tablet 3  . amphetamine-dextroamphetamine (ADDERALL) 30 MG tablet Take 1-2 tablets by mouth daily. 60 tablet 0  . Ascorbic Acid (VITAMIN C PO) Take 1 tablet by mouth 2 (two) times daily.    . B Complex-C (B-COMPLEX WITH VITAMIN C) tablet Take 1 tablet by mouth daily.    . Cholecalciferol 125 MCG (5000 UT) TABS     . clopidogrel (PLAVIX) 75 MG tablet TAKE 1 TABLET BY MOUTH EVERY DAY WITH BREAKFAST 90 tablet 2  . Coenzyme Q10 (COQ10) 100 MG CAPS Take 100 mg by mouth 2 (two) times daily.    . Evolocumab (REPATHA SURECLICK) 263 MG/ML SOAJ Inject 140 mg into the skin every 14 (fourteen) days. 2 pen 11  . Menaquinone-7 (VITAMIN K2) 100 MCG CAPS     .  Misc Natural Products (PROSTATE SUPPORT PO) Take 1 capsule by mouth every evening. Prostate Plus    . nitroGLYCERIN (NITROSTAT) 0.4 MG SL tablet PLACE 1 TABLET UNDER TONGUE EVERY 5 MINS AS NEEDED FOR CHEST PAIN 25 tablet 6  . Omega-3 Fatty Acids (SUPER OMEGA 3 EPA/DHA PO) Take 1 capsule by mouth daily.    . Probiotic Product ([facility] PROBIOTIC) CAPS     . sertraline (ZOLOFT) 25 MG tablet Take 2 tablets (50 mg total) by mouth daily. 180 tablet 3  . testosterone cypionate (DEPOTESTOSTERONE CYPIONATE) 200 MG/ML injection INJECT 0.3ML INTO THE MUSCLE ONCE A WEEK 10 mL 0  . Zinc Acetate (GALZIN) 25 MG CAPS Take by mouth.    . zolpidem (AMBIEN) 10 MG tablet Take 1 tablet (10 mg total) by mouth at bedtime as needed for sleep. 30 tablet 3  . [DISCONTINUED] sertraline (ZOLOFT) 25 MG tablet Take 1 tablet (25 mg total) by mouth daily. 30 tablet 6   No current facility-administered medications on file prior to visit.   Allergies  Allergen Reactions  .  Penicillins Hives    Did it involve swelling of the face/tongue/throat, SOB, or low BP? Unknown Did it involve sudden or severe rash/hives, skin peeling, or any reaction on the inside of your mouth or nose? Yes Did you need to seek medical attention at a hospital or doctor's office? Unknown When did it last happen?Occurred at age 64 or 83 If all above answers are "NO", may proceed with cephalosporin use.   . Statins Other (See Comments)    confusion  . Methylprednisolone Other (See Comments)    Unsure of exact reaction type  . Amphetamines Other (See Comments)    Intolerant to specific formulations: [employer] amphetamine TEVA dextroamp amphetamine  . Lorazepam Other (See Comments)    Mylan lorazepam; others ok  . Propofol Other (See Comments)    Cognitive delay and emotional   Family History  Problem Relation Age of Onset  . Hyperlipidemia Father   . Heart disease Maternal Grandfather 75  . Cancer Paternal Grandfather   . Mental illness Mother   . Mental illness Brother    PE: BP (!) 148/100   Pulse 81   Ht 6\' 1"  (1.854 m)   Wt 181 lb 6.4 oz (82.3 kg)   SpO2 98%   BMI 23.93 kg/m  Wt Readings from Last 3 Encounters:  04/23/20 181 lb 6.4 oz (82.3 kg)  04/04/20 182 lb 9.6 oz (82.8 kg)  02/01/20 180 lb 6.4 oz (81.8 kg)   Constitutional: normal weight, in NAD Eyes: PERRLA, EOMI, no exophthalmos ENT: moist mucous membranes, no thyromegaly, no cervical lymphadenopathy Cardiovascular: RRR, No MRG Respiratory: CTA B Gastrointestinal: abdomen soft, NT, ND, BS+ Musculoskeletal: no deformities, strength intact in all 4 Skin: moist, warm, no rashes Neurological: no tremor with outstretched hands, DTR normal in all 4  ASSESSMENT: 1.  Hypogonadotropic hypogonadism - possible reasons for his very low initial testosterone: 1. Labs were drawn soon after diagnosis of RMSF, which, for him, was a fulminant, systemic, disease. Usually, the pituitary gland will shunt the production of  testosterone or other hormones deemed "unnecessary" during acute illness. Testosterone levels could be quite low in this situation. 2. Labs were drawn also after a prednisone course, but I do not have a clear time sequence 3. He has a history of head trauma x2, which could have caused pituitary insufficiency 4. He has a history of alcohol use, however, he relates that he stopped in 2015  -  Reviewed visit note from Dr. 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  He was given refills of generic sildenafil. -Was advised to continue 0.3 cc of testosterone cypionate weekly (200 mg / 1 mL) -On genital exam, he had mildly atrophic right and left testicles and a prostate of approximately 30 g,  without abnormalities  2. ED  PLAN:  1. Hypogonadism -Patient with history of hypogonadotropic hypogonadism, diagnosed approximately 5 years ago, during an episode of RMSF, during which his testosterone was checked and it was in the castrate range.  He was started on testosterone afterwards and referred to me for further investigation.  His pituitary hormones and the pituitary MRI were normal.  His LH and FSH were low, as expected, on testosterone treatment.  His hypothalamic-pituitary-testicular axis improved afterwards, but did not recover completely so he continues on testosterone replacement.  He is on 0.3 mL testosterone weekly.  We tried to decrease the dose to 0.2 mL weekly but he could not tolerate it due to increased fatigue. -Of note, he had an ED visit in 06/2019 with chest pain, but was ruled out for MI.  He was started on PCSK9 inhibitor.  He also had a coronary stent placed 06/2018.  He was initially off testosterone afterwards, but with cardiology (Dr. Ellyn Hack) consent, we restarted testosterone in 08/2018.   -At last visit, we continued 0.3 mL testosterone weekly but he return for a PSA and testosterone level fasting.  He had a  testosterone level checked in 02/2020 and this was slightly low, at 45 (52-280).  A repeat level was even slightly lower, at 39.  At that time, I wrote him a message about increasing the dose of testosterone from 0.3 to 0.4 mg weekly.  He did not reply to the message. He started to feel much better on this, initially, however, at this visit, he tells me that he has fatigue and does not like the peaks and troughs  given by the testosterone injection.  We discussed at this visit about possibly switching to testosterone gel which gives him less variability in the testosterone levels.  He agrees to try this.  We can try 5 g weekly and titrate based on labs obtained in 1.5 to 2 months from now. -He also has a history of slightly elevated estrogen in the past, most likely due to testosterone treatment.  At last check testosterone was very slightly above the upper limit of normal and we discussed about not checking this anymore.  He agreed. -Of note, most recent CBC was normal in 07/2019 and will repeat another level when he returns in 1.5 to 2 months.Marland Kitchen  PSA was stable, and 0.51, in 02/2020 -I will see him back in a year  2. ED -This has vascular origin, per notes reviewed from Dr. Diona Fanti -He is on PDE 5 inhibitor per urology.  He tolerates this well.  Orders Placed This Encounter  Procedures  . Testosterone Free with SHBG  . CBC   [name], MD PhD [facility] Endocrinology

## 2020-04-24 ENCOUNTER — Telehealth: Payer: Self-pay | Admitting: Internal Medicine

## 2020-04-24 ENCOUNTER — Other Ambulatory Visit: Payer: Self-pay | Admitting: Internal Medicine

## 2020-04-24 ENCOUNTER — Encounter: Payer: Self-pay | Admitting: Internal Medicine

## 2020-04-24 ENCOUNTER — Other Ambulatory Visit: Payer: Self-pay | Admitting: Adult Health

## 2020-04-24 MED ORDER — AMPHETAMINE-DEXTROAMPHETAMINE 30 MG PO TABS
30.0000 mg | ORAL_TABLET | Freq: Every day | ORAL | 0 refills | Status: DC
Start: 2020-04-24 — End: 2020-05-13

## 2020-04-24 MED ORDER — TESTOSTERONE CYPIONATE 200 MG/ML IM SOLN
INTRAMUSCULAR | 5 refills | Status: DC
Start: 2020-04-24 — End: 2020-06-06

## 2020-04-24 NOTE — Telephone Encounter (Signed)
Patient has Testosterone injection being done tomorrow and has no medication. Is requesting just enough for the injection to be called in to the pharmacy below while PA is being completed.   [facility] [place] [address]  Call [phone] for any questions or clarification

## 2020-04-24 NOTE — Addendum Note (Signed)
Addended by: Brandon Melnick on: 04/24/2020 02:56 PM   Modules accepted: Orders

## 2020-04-24 NOTE — Telephone Encounter (Signed)
Pt is requesting a refill for amphetamine-dextroamphetamine (ADDERALL) 30 MG tablet.  Pharmacy: [id number]

## 2020-04-25 NOTE — Telephone Encounter (Signed)
Rx sent to pt's preferred pharmacy.

## 2020-04-30 ENCOUNTER — Telehealth: Payer: Self-pay

## 2020-04-30 DIAGNOSIS — Z8619 Personal history of other infectious and parasitic diseases: Secondary | ICD-10-CM | POA: Diagnosis not present

## 2020-04-30 DIAGNOSIS — Z79899 Other long term (current) drug therapy: Secondary | ICD-10-CM | POA: Diagnosis not present

## 2020-04-30 DIAGNOSIS — G4733 Obstructive sleep apnea (adult) (pediatric): Secondary | ICD-10-CM | POA: Diagnosis not present

## 2020-04-30 DIAGNOSIS — F419 Anxiety disorder, unspecified: Secondary | ICD-10-CM | POA: Diagnosis not present

## 2020-04-30 DIAGNOSIS — F32A Depression, unspecified: Secondary | ICD-10-CM | POA: Diagnosis not present

## 2020-04-30 DIAGNOSIS — F09 Unspecified mental disorder due to known physiological condition: Secondary | ICD-10-CM | POA: Diagnosis not present

## 2020-04-30 DIAGNOSIS — Z7902 Long term (current) use of antithrombotics/antiplatelets: Secondary | ICD-10-CM | POA: Diagnosis not present

## 2020-04-30 DIAGNOSIS — G4761 Periodic limb movement disorder: Secondary | ICD-10-CM | POA: Diagnosis not present

## 2020-04-30 DIAGNOSIS — R9401 Abnormal electroencephalogram [EEG]: Secondary | ICD-10-CM | POA: Diagnosis not present

## 2020-04-30 DIAGNOSIS — F988 Other specified behavioral and emotional disorders with onset usually occurring in childhood and adolescence: Secondary | ICD-10-CM | POA: Diagnosis not present

## 2020-04-30 NOTE — Telephone Encounter (Signed)
Inbound fax from [facility] advising PA for testosterone 50 MG/5 MG (1%) TD Gel has been approved.  Reference number : [id number] Effective dates 04/24/2020 - 05/04/2038

## 2020-05-02 DIAGNOSIS — H52203 Unspecified astigmatism, bilateral: Secondary | ICD-10-CM | POA: Diagnosis not present

## 2020-05-02 DIAGNOSIS — H2513 Age-related nuclear cataract, bilateral: Secondary | ICD-10-CM | POA: Diagnosis not present

## 2020-05-02 DIAGNOSIS — H25013 Cortical age-related cataract, bilateral: Secondary | ICD-10-CM | POA: Diagnosis not present

## 2020-05-03 ENCOUNTER — Encounter: Payer: Self-pay | Admitting: Family Medicine

## 2020-05-03 ENCOUNTER — Telehealth: Payer: Self-pay | Admitting: Family Medicine

## 2020-05-03 NOTE — Telephone Encounter (Signed)
Pt called wanting to reschedule for lab work.   Pt would like the apt to feb 1st or 2nd

## 2020-05-07 NOTE — Telephone Encounter (Signed)
The patient had also sent a message through MyChart. Appointment scheduled - sent this info back to the patient through that message.

## 2020-05-10 ENCOUNTER — Encounter: Payer: Self-pay | Admitting: Family Medicine

## 2020-05-13 MED ORDER — AMPHETAMINE-DEXTROAMPHETAMINE 30 MG PO TABS
30.0000 mg | ORAL_TABLET | Freq: Every day | ORAL | 0 refills | Status: DC
Start: 2020-05-13 — End: 2020-06-20

## 2020-05-17 DIAGNOSIS — E875 Hyperkalemia: Secondary | ICD-10-CM | POA: Diagnosis not present

## 2020-05-18 LAB — CBC WITH DIFFERENTIAL/PLATELET
Basophils Absolute: 0.1 10*3/uL (ref 0.0–0.2)
Basos: 1 %
EOS (ABSOLUTE): 0.4 10*3/uL (ref 0.0–0.4)
Eos: 7 %
Hematocrit: 44.7 % (ref 37.5–51.0)
Hemoglobin: 14.7 g/dL (ref 13.0–17.7)
Immature Grans (Abs): 0 10*3/uL (ref 0.0–0.1)
Immature Granulocytes: 0 %
Lymphocytes Absolute: 2.9 10*3/uL (ref 0.7–3.1)
Lymphs: 48 %
MCH: 30.8 pg (ref 26.6–33.0)
MCHC: 32.9 g/dL (ref 31.5–35.7)
MCV: 94 fL (ref 79–97)
Monocytes Absolute: 0.6 10*3/uL (ref 0.1–0.9)
Monocytes: 10 %
Neutrophils Absolute: 2.1 10*3/uL (ref 1.4–7.0)
Neutrophils: 34 %
Platelets: 287 10*3/uL (ref 150–450)
RBC: 4.78 x10E6/uL (ref 4.14–5.80)
RDW: 12.3 % (ref 11.6–15.4)
WBC: 6 10*3/uL (ref 3.4–10.8)

## 2020-05-18 LAB — BASIC METABOLIC PANEL
BUN/Creatinine Ratio: 15 (ref 10–24)
BUN: 13 mg/dL (ref 8–27)
CO2: 25 mmol/L (ref 20–29)
Calcium: 9.6 mg/dL (ref 8.6–10.2)
Chloride: 101 mmol/L (ref 96–106)
Creatinine, Ser: 0.85 mg/dL (ref 0.76–1.27)
GFR calc Af Amer: 108 mL/min/{1.73_m2} (ref >59)
GFR calc non Af Amer: 93 mL/min/{1.73_m2} (ref >59)
Glucose: 94 mg/dL (ref 65–99)
Potassium: 5.2 mmol/L (ref 3.5–5.2)
Sodium: 141 mmol/L (ref 134–144)

## 2020-05-22 ENCOUNTER — Other Ambulatory Visit: Payer: Self-pay

## 2020-05-22 ENCOUNTER — Encounter: Payer: Self-pay | Admitting: Family Medicine

## 2020-05-22 ENCOUNTER — Ambulatory Visit (INDEPENDENT_AMBULATORY_CARE_PROVIDER_SITE_OTHER): Payer: BC Managed Care – PPO | Admitting: Family Medicine

## 2020-05-22 VITALS — BP 149/75 | HR 79 | Ht 73.0 in | Wt 184.6 lb

## 2020-05-22 DIAGNOSIS — R4589 Other symptoms and signs involving emotional state: Secondary | ICD-10-CM | POA: Diagnosis not present

## 2020-05-22 MED ORDER — VENLAFAXINE HCL 25 MG PO TABS: ORAL_TABLET | ORAL | 6 refills | Status: DC

## 2020-05-22 NOTE — Progress Notes (Signed)
Office Visit Note   Patient: Phillip [name]           Date of Birth: [birth year]           MRN: [id number] Visit Date: 05/22/2020 Requested by: [name], MD [place],  [place] [id number] PCP: [name], MD  Subjective: Chief Complaint  Patient presents with  . Depression    Discuss starting another antidepressant. Was on Zoloft in the past - would like something different.    HPI: He is here with depression.  She he has tapered off Zoloft a couple months ago, it really didn't help him.  In the past he took Trintellix which caused adverse side effects.  He brought with him a genetic assay from 2019 which will hopefully help to fine-tune his therapy.                ROS:   All other systems were reviewed and are negative.  Objective: Vital Signs: BP (!) 149/75   Pulse 79   Ht 6\' 1"  (1.854 m)   Wt 184 lb 9.6 oz (83.7 kg)   BMI 24.36 kg/m   Physical Exam:  General:  Alert and oriented, in no acute distress. Pulm:  Breathing unlabored. Psy:  Normal mood, congruent affect.  No exam done.  Imaging: No results found.  Assessment & Plan: 1. Depression -I will steady his report and come up with some treatment options for him.     Procedures: No procedures performed        PMFS History: Patient Active Problem List   Diagnosis Date Noted  . Carotid bruit 01/04/2020  . History of tobacco abuse 12/20/2019  . Mild emphysema ([place]) 05/27/2019  . Essential hypertension 08/11/2018  . Statin intolerance 08/11/2018  . Coronary artery disease involving native coronary artery of native heart without angina pectoris 07/18/2018  . CAD S/P percutaneous coronary angioplasty 06/30/2018  . Abnormal computed tomography angiography (CTA)   . Hyperlipidemia with target LDL less than 70 05/22/2018  . 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 Chronic prescription benzodiazepine use 09/14/2017  . Chronic insomnia 09/14/2017  . Accidental testosterone overdose 03/16/2017   . Erectile dysfunction 12/09/2016  . 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 Encephalopathy, traumatic 10/08/2015  . 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 Raynaud's disease 10/08/2015  . 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 Anxiety   . Cognitive impairment 01/31/2013  . 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 ADD (attention deficit disorder) 03/10/2012   Past Medical History:  Diagnosis Date  . ADD (attention deficit disorder)    on adderrall managed by neurology  . Anxiety    on ativan managed by Neurology  . 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 CAD S/P percutaneous coronary angioplasty 06/2017   pLAD1 55% (not significant), p-mLAD2 80% & mLAD 70% (tandem lesions - DES PCI . Synergy DES 2.75 x 38 -- 3.0 mm).  mCx ~60% - med Rx, mRCA 30% & 40%.  ER 55-60%.  . Cellulitis   . Chronic traumatic encephalopathy    right frontal-temproal lobe  . Complication of anesthesia    " BRAIN FOG "  . Coronary artery calcification seen on CAT scan 05/12/2018   Triple- by CT - lung ca screen 04/2018  Cardio 06/2018: Coronary calcium score results shows aortic atherosclerosis (with mild dilation) as well as left main and three-vessel coronary artery calcification. The calcium score  is 1168 which is very high risk amount of calcium.  Based on these findings, I would recommend proceeding with a coronary CT angiogram (a more detailed study with Intravenous X  . Depression   . Erectile dysfunction 11/2016   follows with urology; sildenafil prescribed  . Meningoencephalitis 1994   [place]  . Pneumonia   . Primary hypogonadism in male   . Raynaud's disease 67/89/3810   As complication of RMSF  . Restless legs 09/13/2015  . [place] spotted fever 2017  . TBI (traumatic brain injury) ([place]) 1997, 2009   "concussions"    Family History  Problem Relation Age of Onset  . Hyperlipidemia Father    . Heart disease Maternal Grandfather 75  . Cancer Paternal Grandfather   . Mental illness Mother   . Mental illness Brother     Past Surgical History:  Procedure Laterality Date  . CORONARY STENT INTERVENTION N/A 06/30/2018   Procedure: CORONARY STENT INTERVENTION;  Surgeon: [name], MD;  Location: [facility];;  p-mLAD2 80% & mLAD 70% (tandem lesions) - DES PCI Synergy DES 2.75 x 38 (3.0 mm).  . CT CTA CORONARY W/CA SCORE W/CM &/OR WO/CM  06/2018   Cardiac cath score read 1318.  Moderate coronary disease sent for FFR: mRCA 0.84 (non-significant), mLAD 0.64 (signifiant), pLOM(Cx) - 0,72 (significant - but distal lesion)  . LEFT HEART CATH AND CORONARY ANGIOGRAPHY N/A 06/30/2018   Procedure: LEFT HEART CATH AND CORONARY ANGIOGRAPHY;  Surgeon: [name], MD;  Location: [facility] LAB;;  pLAD1 55% (not significant), p-mLAD2 80% & mLAD 70% (tandem lesions -> DES PCI). mCx ~60% (med Rx), mRCA 30% & 40%.  ER 55-60%.   . TEE WITHOUT CARDIOVERSION N/A 09/06/2015   Procedure: TRANSESOPHAGEAL ECHOCARDIOGRAM (TEE);  Surgeon: Jerline Pain, MD;  R/O endocarditis;  . TONSILLECTOMY  1965   Social History   Occupational History  . Occupation: Retired  Tobacco Use  . Smoking status: Former Smoker    Packs/day: 1.50    Years: 25.00    Pack years: 37.50    Types: Cigarettes    Quit date: 11/22/2014    Years since quitting: 5.5  . Smokeless tobacco: Never Used  . Tobacco comment: Encouraged to remain smoke free  Vaping Use  . Vaping Use: Never used  Substance and Sexual Activity  . Alcohol use: No    Alcohol/week: 0.0 standard drinks  . Drug use: No  . Sexual activity: Yes    Partners: Female

## 2020-05-22 NOTE — Addendum Note (Signed)
Addended by: Hortencia Pilar on: 05/22/2020 02:47 PM   Modules accepted: Orders

## 2020-05-24 ENCOUNTER — Encounter: Payer: Self-pay | Admitting: Internal Medicine

## 2020-06-05 ENCOUNTER — Other Ambulatory Visit: Payer: Self-pay

## 2020-06-05 ENCOUNTER — Ambulatory Visit (INDEPENDENT_AMBULATORY_CARE_PROVIDER_SITE_OTHER): Payer: BC Managed Care – PPO

## 2020-06-05 DIAGNOSIS — G4761 Periodic limb movement disorder: Secondary | ICD-10-CM | POA: Diagnosis not present

## 2020-06-05 DIAGNOSIS — R799 Abnormal finding of blood chemistry, unspecified: Secondary | ICD-10-CM

## 2020-06-05 NOTE — Progress Notes (Signed)
Fasting labs drawn: Iron, TIBC & ferritin panel - per Dr. Junius Roads.

## 2020-06-06 ENCOUNTER — Ambulatory Visit: Payer: BC Managed Care – PPO | Admitting: Cardiology

## 2020-06-06 ENCOUNTER — Telehealth: Payer: Self-pay | Admitting: Family Medicine

## 2020-06-06 ENCOUNTER — Encounter: Payer: Self-pay | Admitting: Cardiology

## 2020-06-06 VITALS — BP 130/86 | HR 72 | Ht 73.0 in | Wt 185.0 lb

## 2020-06-06 DIAGNOSIS — T466X5A Adverse effect of antihyperlipidemic and antiarteriosclerotic drugs, initial encounter: Secondary | ICD-10-CM

## 2020-06-06 DIAGNOSIS — Z9861 Coronary angioplasty status: Secondary | ICD-10-CM

## 2020-06-06 DIAGNOSIS — I2510 Atherosclerotic heart disease of native coronary artery without angina pectoris: Secondary | ICD-10-CM

## 2020-06-06 DIAGNOSIS — I7300 Raynaud's syndrome without gangrene: Secondary | ICD-10-CM

## 2020-06-06 DIAGNOSIS — I10 Essential (primary) hypertension: Secondary | ICD-10-CM

## 2020-06-06 DIAGNOSIS — E785 Hyperlipidemia, unspecified: Secondary | ICD-10-CM | POA: Diagnosis not present

## 2020-06-06 DIAGNOSIS — R079 Chest pain, unspecified: Secondary | ICD-10-CM

## 2020-06-06 DIAGNOSIS — I25119 Atherosclerotic heart disease of native coronary artery with unspecified angina pectoris: Secondary | ICD-10-CM

## 2020-06-06 DIAGNOSIS — G720 Drug-induced myopathy: Secondary | ICD-10-CM

## 2020-06-06 LAB — IRON,TIBC AND FERRITIN PANEL
%SAT: 22 % (calc) (ref 20–48)
Ferritin: 38 ng/mL (ref 24–380)
Iron: 90 ug/dL (ref 50–180)
TIBC: 402 mcg/dL (calc) (ref 250–425)

## 2020-06-06 MED ORDER — AMLODIPINE BESYLATE 5 MG PO TABS: 5.0000 mg | ORAL_TABLET | Freq: Two times a day (BID) | ORAL | 3 refills | Status: DC

## 2020-06-06 NOTE — Telephone Encounter (Signed)
Ferritin is lower than before, but still in acceptable range.

## 2020-06-06 NOTE — Patient Instructions (Addendum)
Medication Instructions:    increase Amlodipine to 5 mg twice a day , if test is negative you can return to 5 mg once d ay   *If you need a refill on your cardiac medications before your next appointment, please call your pharmacy*   Lab Work:  You will need a COVID-19  Test 3 days prior to your procedure Exercise treadmill test ( Myoview) .  This is a Drive Up Visit at [address] [place], [place] [address]. Someone will direct you to the appropriate testing line. Stay in your car and someone will be with you shortly.   Testing/Procedures:   You will need to have a covid test 3 days prior to Exercise Myoview ( [address]) Your physician has requested that you have en exercise stress myoview. Please follow instruction sheet, as given.   Follow-Up: At [facility], you and your health needs are our priority.  As part of our continuing mission to provide you with exceptional heart care, we have created designated Provider Care Teams.  These Care Teams include your primary Cardiologist (physician) and Advanced Practice Providers (APPs -  Physician Assistants and Nurse Practitioners) who all work together to provide you with the care you need, when you need it.     Your next appointment:   6 month(s)  The format for your next appointment:   In Person  Provider:   [name], MD   Other Instructions   increase Amlodipine to 5 mg twice a day , if test is negative you can return to 5 mg once d ay

## 2020-06-06 NOTE — Progress Notes (Signed)
Primary Care Provider: Lavada Mesi, MD Cardiologist: Bryan Lemma, MD Electrophysiologist: None  Clinic Note: Chief Complaint  Patient presents with  . Chest Pain    Described as tightness in his chest with exercise  . Coronary Artery Disease    On stable medicines  . Follow-up    53-month   ===================================  ASSESSMENT/PLAN   Problem List Items Addressed This Visit    CAD S/P percutaneous coronary angioplasty (Chronic)    He had single-vessel disease with PCI to the LAD just about 2 years ago.  Now noticing some exertional chest tightness.   He is on maintenance dose clopidogrel/Plavix well without aspirin.  No bleeding issues. => Pending results of Myoview, would probably be okay to hold for procedures or surgeries 5 days preop.  Not on statin-on Repatha  Attempt to increase amlodipine to 5 mg twice daily for antianginal benefit.    We will check a treadmill Myoview stress test.      Relevant Medications   amLODipine (NORVASC) 5 MG tablet   Other Relevant Orders   EKG 12-Lead (Completed)   MYOCARDIAL PERFUSION IMAGING   Cardiac Stress Test: Informed Consent Details: Physician/Practitioner Attestation; Transcribe to consent form and obtain patient signature   Coronary artery disease involving native coronary artery of native heart with angina pectoris (HCC) (Chronic)    I am not sure if his symptoms are anginal in nature, but he is having some exertional chest tightness.  Reasonable to evaluate with Myoview stress test.  He is not on beta-blocker because of fatigue, and not on ARB because wheezing amlodipine for antianginal benefit.  Plan: Increase amlodipine to 5 mg twice daily to avoid hypotension but increase antianginal benefit We Are Checking a Treadmill Myoview Stress Test.-See consent below.      Relevant Medications   amLODipine (NORVASC) 5 MG tablet   Other Relevant Orders   EKG 12-Lead (Completed)   MYOCARDIAL PERFUSION IMAGING    Cardiac Stress Test: Informed Consent Details: Physician/Practitioner Attestation; Transcribe to consent form and obtain patient signature   Hyperlipidemia with target LDL less than 70 (Chronic)    Did not do well with statins.  He is now on Repatha.  Lipids look well controlled.      Relevant Medications   amLODipine (NORVASC) 5 MG tablet   Statin myopathy (Chronic)    Intolerant of statins because of myalgias.  Now on Repatha.      Raynaud's disease (Chronic)    Using amlodipine for antianginal effect as well as Raynaud's.      Relevant Medications   amLODipine (NORVASC) 5 MG tablet   Essential hypertension (Chronic)    His blood pressure is okay.  Should be well-tolerated increase dose of amlodipine.  I am not can have him take 10 mg at 1 time to avoid hypotension.-He will take 5 mg twice daily.      Relevant Medications   amLODipine (NORVASC) 5 MG tablet   Exertional chest pain - Primary    Interestingly this happens with pretty significant exercise.  However he does have LAD disease.  Plan: Treadmill Myoview Stress Test      Relevant Orders   EKG 12-Lead (Completed)   MYOCARDIAL PERFUSION IMAGING   Cardiac Stress Test: Informed Consent Details: Physician/Practitioner Attestation; Transcribe to consent form and obtain patient signature      ===================================  HPI:    Phillip [name] is a 65 y.o. male with a PMH notable for CAD-PCI, HTN, HLD who presents today for 39-month  follow-up.   CAD: (Feb 2020 -elevated CORONARY CALCIUM SCORE --> CORONARY CTAD with LA lesion --> Cath & LAD DES PCI)  Phillip [name] was last seen on 01/04/2020 -> started on a vegan diet cardiac alcohol products.  Also increase his exercise level.  He lost 12 pounds.  He was concerned about MRI showing possible old stroke.  (He had subsequently had carotid Dopplers etc.) -> Rare skipped beats but otherwise doing well.  Recent Hospitalizations: None  In the interim since I last  saw him, he was very concerned about his MRI findings showing prior stroke, he therefore requested carotid Dopplers.  Reviewed  CV studies:    The following studies were reviewed today: (if available, images/films reviewed: From Epic Chart or Care Everywhere) . Carotid Dopplers 01/26/2020: 1-39% bilateral ICA, < 50% bilateral CCA left vertebral artery is dominant with normal flow.  Right vertebral artery appears occluded versus atretic.  Normal subclavian arteries. . MR angio of neck 03/20/2020: Right vertebral artery is nondominant and diminutive.  Is patent throughout with antegrade flow.  There does appear to be moderate stenosis at the origin.  Dominant left vertebral artery is patent throughout.   Interval History:   Phillip [name] returns here today indicating that he has had multiple evaluations since I last saw including a sleep study which showed no evidence of sleep apnea, but he does have "periodic limb movement " that very much interrupts his sleep.  This was initially noted by his girlfriend.  It does keep him from getting good sleep.  He has been trying to be more active doing exercise and hoping that may help him sleep some, in doing so he occasionally feels tightness in his chest.  He actually has been jogging to the point of actually doing some distance running.  He has been doing pretty well but of the last month or so he has been noticing this tightness in his chest when he runs.  He is very concerned that it could be related to his coronary disease.  I explained to him that the fact that he is having discomfort with running and not with routine activity is relatively reassuring.  Otherwise no symptoms of rapid irregular heartbeats palpitations.  No claudication symptoms.  No CHF symptoms.  CV Review of Symptoms (Summary): positive for - Exertional chest tightness negative for - dyspnea on exertion, edema, irregular heartbeat, orthopnea, palpitations, paroxysmal nocturnal dyspnea,  rapid heart rate, shortness of breath or Only has lightheadedness when he is exercising hard, otherwise no syncope or near syncope, TIA/amaurosis fugax.  The patient does not have symptoms concerning for COVID-19 infection (fever, chills, cough, or new shortness of breath).   REVIEWED OF SYSTEMS   Review of Systems  Constitutional: Negative for malaise/fatigue and weight loss.  HENT: Negative for congestion and nosebleeds.   Respiratory: Negative for shortness of breath.   Cardiovascular:       Per HPI  Gastrointestinal: Negative for blood in stool and melena.  Genitourinary: Negative for hematuria.  Musculoskeletal: Negative for falls and joint pain.  Neurological: Negative for dizziness (Mild positional dizziness.), focal weakness, loss of consciousness and headaches.       Poor sleep related to periodic limb movement  Psychiatric/Behavioral: Negative for depression and memory loss. The patient is nervous/anxious and has insomnia (He actually sleeping okay, but wakes up with movements).    I have reviewed and (if needed) personally updated the patient's problem list, medications, allergies, past medical and surgical history, social  and family history.   PAST MEDICAL HISTORY   Past Medical History:  Diagnosis Date  . ADD (attention deficit disorder)    on adderrall managed by neurology  . Anxiety    on ativan managed by Neurology  . 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 CAD S/P percutaneous coronary angioplasty 06/2017   pLAD1 55% (not significant), p-mLAD2 80% & mLAD 70% (tandem lesions - DES PCI . Synergy DES 2.75 x 38 -- 3.0 mm).  mCx ~60% - med Rx, mRCA 30% & 40%.  ER 55-60%.  . Cellulitis   . Chronic traumatic encephalopathy    right frontal-temproal lobe  . Complication of anesthesia    " BRAIN FOG "  . Depression   . Erectile dysfunction 11/2016   follows with urology; sildenafil  prescribed  . Meningoencephalitis 1994   [place]  . Pneumonia   . Primary hypogonadism in male   . Raynaud's disease [id number]   As complication of RMSF  . Restless legs 09/13/2015  . [place] spotted fever 2017  . TBI (traumatic brain injury) ([place]) 1997, 2009   "concussions"  . Vertebral artery stenosis, asymptomatic, right    Likely atretic; large dominant left vertebral artery noted.    PAST SURGICAL HISTORY   Past Surgical History:  Procedure Laterality Date  . CORONARY STENT INTERVENTION N/A 06/30/2018   Procedure: CORONARY STENT INTERVENTION;  Surgeon: [name], MD;  Location: [facility];;  p-mLAD2 80% & mLAD 70% (tandem lesions) - DES PCI Synergy DES 2.75 x 38 (3.0 mm).  . CT CTA CORONARY W/CA SCORE W/CM &/OR WO/CM  06/2018   Cardiac cath score read 1318.  Moderate coronary disease sent for FFR: mRCA 0.84 (non-significant), mLAD 0.64 (signifiant), pLOM(Cx) - 0,72 (significant - but distal lesion)  . LEFT HEART CATH AND CORONARY ANGIOGRAPHY N/A 06/30/2018   Procedure: LEFT HEART CATH AND CORONARY ANGIOGRAPHY;  Surgeon: [name], MD;  Location: [facility];;  pLAD1 55% (not significant), p-mLAD2 80% & mLAD 70% (tandem lesions -> DES PCI). mCx ~60% (med Rx), mRCA 30% & 40%.  ER 55-60%.   . TEE WITHOUT CARDIOVERSION N/A 09/06/2015   Procedure: TRANSESOPHAGEAL ECHOCARDIOGRAM (TEE);  Surgeon: Jerline Pain, MD;  R/O endocarditis;  . TONSILLECTOMY  1965    Cardiac Cath2/26/2020:pLAD1 55% (not significant), p-mLAD2 80% & mLAD 70% (tandem lesions) --> - DES PCI Synergy DES 2.75 x 38 (3.0 mm).. mCx ~60%, mRCA 30% &40%. (med Rx)ER 55-60%.    Immunization History  Administered Date(s) Administered  . PFIZER(Purple Top)SARS-COV-2 Vaccination 07/17/2019, 08/08/2019  . Tdap 05/05/2010    MEDICATIONS/ALLERGIES   Current Meds  Medication Sig  . amphetamine-dextroamphetamine (ADDERALL) 30 MG tablet Take 1-2 tablets by mouth daily.  . Ascorbic  Acid (VITAMIN C PO) Take 1 tablet by mouth 2 (two) times daily.  . B Complex-C (B-COMPLEX WITH VITAMIN C) tablet Take 1 tablet by mouth daily.  . Cholecalciferol 125 MCG (5000 UT) TABS   . clopidogrel (PLAVIX) 75 MG tablet TAKE 1 TABLET BY MOUTH EVERY DAY WITH BREAKFAST  . Coenzyme Q10 (COQ10) 100 MG CAPS Take 100 mg by mouth 2 (two) times daily.  . Evolocumab (REPATHA SURECLICK) [id number] MG/ML SOAJ Inject 140 mg into the skin every 14 (fourteen) days.  . Menaquinone-7 (VITAMIN K2) 100 MCG CAPS   . Misc Natural Products (PROSTATE SUPPORT PO) Take 1 capsule by mouth  every evening. Prostate Plus  . nitroGLYCERIN (NITROSTAT) 0.4 MG SL tablet PLACE 1 TABLET UNDER TONGUE EVERY 5 MINS AS NEEDED FOR CHEST PAIN  . Omega-3 Fatty Acids (SUPER OMEGA 3 EPA/DHA PO) Take 1 capsule by mouth daily.  . Probiotic Product ([employer] PROBIOTIC) CAPS   . testosterone (ANDROGEL) 50 MG/5GM (1%) GEL Place 5 g onto the skin daily.  Marland Kitchen venlafaxine (EFFEXOR) 25 MG tablet Start 1/2 tab PO BID, may increase to 1 PO BID if needed/tolerated.  . Zinc Acetate (GALZIN) 25 MG CAPS Take by mouth.  . [DISCONTINUED] amLODipine (NORVASC) 5 MG tablet TAKE 1 TABLET BY MOUTH EVERY DAY    Allergies  Allergen Reactions  . Penicillins Hives    Did it involve swelling of the face/tongue/throat, SOB, or low BP? Unknown Did it involve sudden or severe rash/hives, skin peeling, or any reaction on the inside of your mouth or nose? Yes Did you need to seek medical attention at a hospital or doctor's office? Unknown When did it last happen?Occurred at age 20 or 57 If all above answers are "NO", may proceed with cephalosporin use.   . Statins Other (See Comments)    confusion  . Methylprednisolone Other (See Comments)    Unsure of exact reaction type  . Penicillin G Other (See Comments)  . Amphetamines Other (See Comments)    Intolerant to specific formulations: [employer] amphetamine TEVA dextroamp amphetamine  . Lorazepam Other (See Comments)     Mylan lorazepam; others ok  . Propofol Other (See Comments)    Cognitive delay and emotional    SOCIAL HISTORY/FAMILY HISTORY   Reviewed in Epic:  Pertinent findings:  Social History   Tobacco Use  . Smoking status: Former Smoker    Packs/day: 1.50    Years: 25.00    Pack years: 37.50    Types: Cigarettes    Quit date: 11/22/2014    Years since quitting: 5.5  . Smokeless tobacco: Never Used  . Tobacco comment: Encouraged to remain smoke free  Vaping Use  . Vaping Use: Never used  Substance Use Topics  . Alcohol use: No    Alcohol/week: 0.0 standard drinks  . Drug use: No   Social History   Social History Narrative   Divorced. B.A. degree. Retired.   Drinks caffeine, uses herbal remedies, takes a daily vitamin.   Wears his seatbelt.  Smoke detector in the home.   Firearms locked in the home.   Feels safe in relationships.       He now has switched to a vegan diet.  Notably increase exercise level.    OBJCTIVE -PE, EKG, labs   Wt Readings from Last 3 Encounters:  06/06/20 185 lb (83.9 kg)  05/22/20 184 lb 9.6 oz (83.7 kg)  04/23/20 181 lb 6.4 oz (82.3 kg)    Physical Exam: BP 130/86 (BP Location: Left Arm, Patient Position: Sitting, Cuff Size: Normal)   Pulse 72   Ht 6\' 1"  (1.854 m)   Wt 185 lb (83.9 kg)   BMI 24.41 kg/m  Physical Exam Constitutional:      General: He is not in acute distress.    Appearance: Normal appearance. He is normal weight. He is not ill-appearing or toxic-appearing.  HENT:     Head: Normocephalic and atraumatic.  Neck:     Vascular: No carotid bruit.  Cardiovascular:     Rate and Rhythm: Normal rate and regular rhythm.     Pulses: Normal pulses.     Heart sounds:  Normal heart sounds. No murmur heard. No friction rub. No gallop.   Pulmonary:     Effort: Pulmonary effort is normal. No respiratory distress.     Breath sounds: Normal breath sounds.  Chest:     Chest wall: No tenderness.  Musculoskeletal:        General:  No swelling. Normal range of motion.     Cervical back: Normal range of motion.  Lymphadenopathy:     Cervical: No cervical adenopathy.  Skin:    General: Skin is warm and dry.  Neurological:     General: No focal deficit present.     Mental Status: He is alert and oriented to person, place, and time. Mental status is at baseline.     Motor: No weakness.     Gait: Gait normal.  Psychiatric:        Mood and Affect: Mood normal.        Behavior: Behavior normal.        Judgment: Judgment normal.     Comments: He is slow to answer questions, but more clear and ready to answer questions today.      Adult ECG Report  Rate: 72 ;  Rhythm: normal sinus rhythm and Normal axis, intervals and durations.;   Narrative Interpretation: Stable  Recent Labs: Reviewed Lab Results  Component Value Date   CHOL 134 03/22/2020   HDL 72 03/22/2020   LDLCALC 52 03/22/2020   TRIG 42 03/22/2020   CHOLHDL 1.9 03/22/2020   Lab Results  Component Value Date   CREATININE 0.85 05/17/2020   BUN 13 05/17/2020   NA 141 05/17/2020   K 5.2 05/17/2020   CL 101 05/17/2020   CO2 25 05/17/2020   CBC Latest Ref Rng & Units 05/17/2020 07/08/2019 06/08/2019  WBC 3.4 - 10.8 x10E3/uL 6.0 3.9 5.8  Hemoglobin 13.0 - 17.7 g/dL 14.7 14.3 14.5  Hematocrit 37.5 - 51.0 % 44.7 43.5 43.4  Platelets 150 - 450 x10E3/uL 287 269 315    Lab Results  Component Value Date   TSH 2.67 12/15/2017    ==================================================  COVID-19 Education: The signs and symptoms of COVID-19 were discussed with the patient and how to seek care for testing (follow up with PCP or arrange E-visit).   The importance of social distancing and COVID-19 vaccination was discussed today. The patient is practicing social distancing & Masking.   I spent a total of 34 minutes with the patient spent in direct patient consultation.  Additional time spent with chart review  / charting (studies, outside notes, etc): 16  min Total Time: 50 min   Current medicines are reviewed at length with the patient today.  (+/- concerns) N/A  This visit occurred during the SARS-CoV-2 public health emergency.  Safety protocols were in place, including screening questions prior to the visit, additional usage of staff PPE, and extensive cleaning of exam room while observing appropriate contact time as indicated for disinfecting solutions.  Notice: This dictation was prepared with Dragon dictation along with smaller phrase technology. Any transcriptional errors that result from this process are unintentional and may not be corrected upon review.  Patient Instructions / Medication Changes & Studies & Tests Ordered   Patient Instructions  Medication Instructions:    increase Amlodipine to 5 mg twice a day , if test is negative you can return to 5 mg once d ay   *If you need a refill on your cardiac medications before your next appointment, please call your pharmacy*  Lab Work:  You will need a COVID-19  Test 3 days prior to your procedure Exercise treadmill test ( Myoview) .  This is a Drive Up Visit at [id number] [place], [place] [address]. Someone will direct you to the appropriate testing line. Stay in your car and someone will be with you shortly.   Testing/Procedures:   You will need to have a covid test 3 days prior to Exercise Myoview ( [address]) Your physician has requested that you have en exercise stress myoview. Please follow instruction sheet, as given.   Follow-Up: At [facility], you and your health needs are our priority.  As part of our continuing mission to provide you with exceptional heart care, we have created designated Provider Care Teams.  These Care Teams include your primary Cardiologist (physician) and Advanced Practice Providers (APPs -  Physician Assistants and Nurse Practitioners) who all work together to provide you with the care you need, when you need it.      Your next appointment:   6 month(s)  The format for your next appointment:   In Person  Provider:   [name], MD   Other Instructions   increase Amlodipine to 5 mg twice a day , if test is negative you can return to 5 mg once d ay     Studies Ordered:   Orders Placed This Encounter  Procedures  . Cardiac Stress Test: Informed Consent Details: Physician/Practitioner Attestation; Transcribe to consent form and obtain patient signature  . MYOCARDIAL PERFUSION IMAGING  . EKG 12-Lead   Shared Decision Making/Informed Consent The risks [chest pain, shortness of breath, cardiac arrhythmias, dizziness, blood pressure fluctuations, myocardial infarction, stroke/transient ischemic attack, nausea, vomiting, allergic reaction, radiation exposure, metallic taste sensation and life-threatening complications (estimated to be 1 in 10,000)], benefits (risk stratification, diagnosing coronary artery disease, treatment guidance) and alternatives of a nuclear stress test were discussed in detail with Phillip [name] and he agrees to proceed.       [name], M.D., M.S. Interventional Cardiologist   Pager # [phone] Phone # [phone] [address] [place], [place] [address]   Thank you for choosing Heartcare at [facility]!!

## 2020-06-08 DIAGNOSIS — G4733 Obstructive sleep apnea (adult) (pediatric): Secondary | ICD-10-CM | POA: Diagnosis not present

## 2020-06-08 DIAGNOSIS — G4761 Periodic limb movement disorder: Secondary | ICD-10-CM | POA: Diagnosis not present

## 2020-06-08 DIAGNOSIS — F5104 Psychophysiologic insomnia: Secondary | ICD-10-CM | POA: Diagnosis not present

## 2020-06-08 DIAGNOSIS — G4700 Insomnia, unspecified: Secondary | ICD-10-CM | POA: Diagnosis not present

## 2020-06-08 DIAGNOSIS — I10 Essential (primary) hypertension: Secondary | ICD-10-CM | POA: Diagnosis not present

## 2020-06-08 DIAGNOSIS — F419 Anxiety disorder, unspecified: Secondary | ICD-10-CM | POA: Diagnosis not present

## 2020-06-08 DIAGNOSIS — Z87820 Personal history of traumatic brain injury: Secondary | ICD-10-CM | POA: Diagnosis not present

## 2020-06-08 DIAGNOSIS — R4189 Other symptoms and signs involving cognitive functions and awareness: Secondary | ICD-10-CM | POA: Diagnosis not present

## 2020-06-08 DIAGNOSIS — F988 Other specified behavioral and emotional disorders with onset usually occurring in childhood and adolescence: Secondary | ICD-10-CM | POA: Diagnosis not present

## 2020-06-08 DIAGNOSIS — G4730 Sleep apnea, unspecified: Secondary | ICD-10-CM | POA: Diagnosis not present

## 2020-06-09 ENCOUNTER — Encounter: Payer: Self-pay | Admitting: Cardiology

## 2020-06-09 NOTE — Assessment & Plan Note (Signed)
His blood pressure is okay.  Should be well-tolerated increase dose of amlodipine.  I am not can have him take 10 mg at 1 time to avoid hypotension.-He will take 5 mg twice daily.

## 2020-06-09 NOTE — Assessment & Plan Note (Signed)
Interestingly this happens with pretty significant exercise.  However he does have LAD disease.  Plan: Treadmill Myoview Stress Test

## 2020-06-09 NOTE — Assessment & Plan Note (Signed)
Did not do well with statins.  He is now on Repatha.  Lipids look well controlled.

## 2020-06-09 NOTE — Assessment & Plan Note (Signed)
I am not sure if his symptoms are anginal in nature, but he is having some exertional chest tightness.  Reasonable to evaluate with Myoview stress test.  He is not on beta-blocker because of fatigue, and not on ARB because wheezing amlodipine for antianginal benefit.  Plan: Increase amlodipine to 5 mg twice daily to avoid hypotension but increase antianginal benefit We Are Checking a Treadmill Myoview Stress Test.-See consent below.

## 2020-06-09 NOTE — Assessment & Plan Note (Signed)
Intolerant of statins because of myalgias.  Now on Repatha.

## 2020-06-09 NOTE — Assessment & Plan Note (Addendum)
He had single-vessel disease with PCI to the LAD just about 2 years ago.  Now noticing some exertional chest tightness.   He is on maintenance dose clopidogrel/Plavix well without aspirin.  No bleeding issues. => Pending results of Myoview, would probably be okay to hold for procedures or surgeries 5 days preop.  Not on statin-on Repatha  Attempt to increase amlodipine to 5 mg twice daily for antianginal benefit.    We will check a treadmill Myoview stress test.

## 2020-06-09 NOTE — Assessment & Plan Note (Signed)
Using amlodipine for antianginal effect as well as Raynaud's.

## 2020-06-14 ENCOUNTER — Other Ambulatory Visit: Payer: Self-pay | Admitting: Family Medicine

## 2020-06-20 ENCOUNTER — Telehealth: Payer: Self-pay | Admitting: Family Medicine

## 2020-06-20 ENCOUNTER — Other Ambulatory Visit: Payer: BC Managed Care – PPO

## 2020-06-20 MED ORDER — AMPHETAMINE-DEXTROAMPHETAMINE 30 MG PO TABS: 30.0000 mg | ORAL_TABLET | Freq: Every day | ORAL | 0 refills | Status: DC

## 2020-06-20 NOTE — Telephone Encounter (Signed)
Sent!

## 2020-06-20 NOTE — Telephone Encounter (Signed)
Patient's assistant [name] called advised patient need Rx refilled (Adderal) The number to contact [name] is [phone]

## 2020-06-20 NOTE — Telephone Encounter (Signed)
Please advise 

## 2020-06-20 NOTE — Telephone Encounter (Signed)
I called and left voice mail that this was done.

## 2020-06-25 ENCOUNTER — Encounter: Payer: Self-pay | Admitting: Family Medicine

## 2020-06-26 MED ORDER — PREDNISONE 10 MG PO TABS: ORAL_TABLET | ORAL | 0 refills | Status: DC

## 2020-06-26 NOTE — Addendum Note (Signed)
Addended by: Hortencia Pilar on: 06/26/2020 12:58 PM   Modules accepted: Orders

## 2020-06-27 ENCOUNTER — Other Ambulatory Visit: Payer: BC Managed Care – PPO

## 2020-06-30 ENCOUNTER — Other Ambulatory Visit (HOSPITAL_COMMUNITY): Payer: BC Managed Care – PPO

## 2020-07-02 ENCOUNTER — Other Ambulatory Visit (INDEPENDENT_AMBULATORY_CARE_PROVIDER_SITE_OTHER): Payer: BC Managed Care – PPO

## 2020-07-02 ENCOUNTER — Other Ambulatory Visit: Payer: Self-pay

## 2020-07-02 DIAGNOSIS — E230 Hypopituitarism: Secondary | ICD-10-CM | POA: Diagnosis not present

## 2020-07-04 ENCOUNTER — Encounter: Payer: Self-pay | Admitting: Family Medicine

## 2020-07-04 ENCOUNTER — Ambulatory Visit (HOSPITAL_COMMUNITY)
Admission: RE | Admit: 2020-07-04 | Payer: BC Managed Care – PPO | Source: Ambulatory Visit | Attending: Cardiology | Admitting: Cardiology

## 2020-07-04 MED ORDER — ZOLPIDEM TARTRATE 10 MG PO TABS: 10.0000 mg | ORAL_TABLET | Freq: Every evening | ORAL | 3 refills | Status: DC | PRN

## 2020-07-09 ENCOUNTER — Other Ambulatory Visit (HOSPITAL_COMMUNITY)
Admission: RE | Admit: 2020-07-09 | Discharge: 2020-07-09 | Disposition: A | Discharge status: Home / Self Care | Payer: BC Managed Care – PPO | Source: Ambulatory Visit | Attending: Cardiology | Admitting: Cardiology

## 2020-07-09 DIAGNOSIS — Z01812 Encounter for preprocedural laboratory examination: Secondary | ICD-10-CM | POA: Insufficient documentation

## 2020-07-09 DIAGNOSIS — Z20822 Contact with and (suspected) exposure to covid-19: Secondary | ICD-10-CM | POA: Insufficient documentation

## 2020-07-09 LAB — SARS CORONAVIRUS 2 (TAT 6-24 HRS): SARS Coronavirus 2: NEGATIVE

## 2020-07-09 LAB — TESTOSTERONE, FREE AND TOTAL (INCLUDES SHBG)-(MALES)
% Free Testosterone: 0.8 %
Free Testosterone, S: 58 pg/mL
Sex Hormone Binding Globulin: 65.9 nmol/L
Testosterone, Serum (Total): 724 ng/dL

## 2020-07-10 ENCOUNTER — Telehealth (HOSPITAL_COMMUNITY): Payer: Self-pay | Admitting: *Deleted

## 2020-07-10 NOTE — Telephone Encounter (Signed)
Close encounter 

## 2020-07-11 ENCOUNTER — Ambulatory Visit (HOSPITAL_COMMUNITY)
Admission: RE | Admit: 2020-07-11 | Discharge: 2020-07-11 | Disposition: A | Discharge status: Home / Self Care | Payer: BC Managed Care – PPO | Source: Ambulatory Visit | Attending: Cardiology | Admitting: Cardiology

## 2020-07-11 ENCOUNTER — Other Ambulatory Visit: Payer: Self-pay

## 2020-07-11 DIAGNOSIS — Z9861 Coronary angioplasty status: Secondary | ICD-10-CM | POA: Diagnosis not present

## 2020-07-11 DIAGNOSIS — I25119 Atherosclerotic heart disease of native coronary artery with unspecified angina pectoris: Secondary | ICD-10-CM | POA: Diagnosis not present

## 2020-07-11 DIAGNOSIS — R079 Chest pain, unspecified: Secondary | ICD-10-CM | POA: Insufficient documentation

## 2020-07-11 DIAGNOSIS — I2510 Atherosclerotic heart disease of native coronary artery without angina pectoris: Secondary | ICD-10-CM | POA: Insufficient documentation

## 2020-07-11 LAB — MYOCARDIAL PERFUSION IMAGING
Estimated workload: 15.7 METS
Exercise duration (min): 13 min
Exercise duration (sec): 54 s
LV dias vol: 142 mL (ref 62–150)
LV sys vol: 65 mL
MPHR: 158 {beats}/min
Peak HR: 133 {beats}/min
Percent HR: 84 %
Rest HR: 61 {beats}/min
SDS: 0
SRS: 2
SSS: 2
TID: 1.06

## 2020-07-11 MED ORDER — TECHNETIUM TC 99M TETROFOSMIN IV KIT
31.6000 | PACK | Freq: Once | INTRAVENOUS | Status: AC | PRN
  Administered 2020-07-11: 31.6 via INTRAVENOUS
  Filled 2020-07-11: qty 32

## 2020-07-11 MED ORDER — TECHNETIUM TC 99M TETROFOSMIN IV KIT
10.5000 | PACK | Freq: Once | INTRAVENOUS | Status: AC | PRN
  Administered 2020-07-11: 10.5 via INTRAVENOUS
  Filled 2020-07-11: qty 11

## 2020-07-20 MED ORDER — AMPHETAMINE-DEXTROAMPHETAMINE 30 MG PO TABS: 30.0000 mg | ORAL_TABLET | Freq: Every day | ORAL | 0 refills | Status: DC

## 2020-07-20 NOTE — Addendum Note (Signed)
Addended by: Hortencia Pilar on: 07/20/2020 12:51 PM   Modules accepted: Orders

## 2020-08-01 NOTE — Telephone Encounter (Signed)
Called patient left message on personal voice mail to call back. 

## 2020-08-01 NOTE — Telephone Encounter (Signed)
Left message for patient/patient's wife to call back to further discuss symptoms

## 2020-08-02 DIAGNOSIS — G4733 Obstructive sleep apnea (adult) (pediatric): Secondary | ICD-10-CM | POA: Diagnosis not present

## 2020-08-07 ENCOUNTER — Encounter: Payer: Self-pay | Admitting: Family Medicine

## 2020-08-07 ENCOUNTER — Telehealth: Payer: Self-pay | Admitting: *Deleted

## 2020-08-07 DIAGNOSIS — Z1211 Encounter for screening for malignant neoplasm of colon: Secondary | ICD-10-CM

## 2020-08-07 NOTE — Telephone Encounter (Signed)
Error. See epic note

## 2020-08-07 NOTE — Telephone Encounter (Signed)
Spoke with the patient. He stated that he has been having chest tightness off and on for a few weeks lately that will last around 20 minutes. He stated that this occurs at rest. He was currently asymptomatic.   He stated that his mother passed away a few weeks ago unexpectedly and he feels like it may be stress from that. He has not taken nitro for it.   He also stated that he has been having some edema in his lower extremities but this does resolve overnight. He has been advised to elevate his legs when possible.  He stated that his blood pressure is normally around 135/80 and heart rate runs around 70.  He has been given advice on stress relieving measures and has been advised to talk to family members about his feelings concerning the loss of his mother.  ED precautions have been given.

## 2020-08-08 ENCOUNTER — Ambulatory Visit: Payer: BC Managed Care – PPO | Admitting: Adult Health

## 2020-08-09 NOTE — Telephone Encounter (Signed)
Symptoms not really sound very anginal.  The still watchful waiting.  If he has nitroglycerin he can try taking it.  As long as is not exertional, probably not something to be concerned about.  Still there after a week or 2, or has a severe episode then we can see him urgently.  [name], MD

## 2020-08-09 NOTE — Telephone Encounter (Signed)
Called not able to leave message - mailbox is full. Sent message through [facility] ,  Dr Ellyn Hack reviewed conversation you had with the nurse the other day.   Per Dr Ellyn Hack, continue to monitor heart for 2 weeks or so.Dr Ellyn Hack does not think your discomfort is coming from your heart . You can try Nitroglycerin if you have chest pain to see if will help. Dr Ellyn Hack wants you to contact office if  you have episodes  that become worse or  multi episodes occurring this time period.  Ivin Booty RN

## 2020-08-12 ENCOUNTER — Other Ambulatory Visit: Payer: Self-pay | Admitting: Cardiology

## 2020-08-17 ENCOUNTER — Other Ambulatory Visit: Payer: Self-pay | Admitting: Cardiology

## 2020-08-21 ENCOUNTER — Telehealth: Payer: Self-pay | Admitting: Family Medicine

## 2020-08-21 MED ORDER — AMPHETAMINE-DEXTROAMPHETAMINE 30 MG PO TABS: 30.0000 mg | ORAL_TABLET | Freq: Every day | ORAL | 0 refills | Status: DC

## 2020-08-21 NOTE — Telephone Encounter (Signed)
I called and advised [name].

## 2020-08-21 NOTE — Telephone Encounter (Signed)
Not sure if it will work (3 months), but I tried.

## 2020-08-21 NOTE — Telephone Encounter (Signed)
Patient's assistant [name] called advised patient need Rx refilled (Adderal)  [name] said patient is out of his medication. [name] asked if the Rx can be  called in ahead of time so the pharmacy can keep the Rx on file for 3 months. That's (3 separate months). The number to contact [name] is  [phone]

## 2020-09-01 DIAGNOSIS — G4733 Obstructive sleep apnea (adult) (pediatric): Secondary | ICD-10-CM | POA: Diagnosis not present

## 2020-09-26 ENCOUNTER — Encounter: Payer: Self-pay | Admitting: Family Medicine

## 2020-09-26 MED ORDER — CYCLOBENZAPRINE HCL 10 MG PO TABS: 10.0000 mg | ORAL_TABLET | Freq: Every evening | ORAL | 6 refills | Status: DC | PRN

## 2020-10-02 DIAGNOSIS — G4733 Obstructive sleep apnea (adult) (pediatric): Secondary | ICD-10-CM | POA: Diagnosis not present

## 2020-10-15 ENCOUNTER — Encounter: Payer: Self-pay | Admitting: Internal Medicine

## 2020-10-15 ENCOUNTER — Telehealth: Payer: Self-pay | Admitting: Internal Medicine

## 2020-10-15 NOTE — Telephone Encounter (Signed)
Hey Dr. Carlean Purl,   We received a referral; transfer of care for colonoscopy. Patient last colonoscopy was in 2016. I have the records and will send for review. Could you please advise on scheduling?

## 2020-10-15 NOTE — Telephone Encounter (Signed)
I will except the patient and be his gastroenterologist and perform his colonoscopy when needed.  I reviewed the records and he had 2 very small adenomas.  At the time he had his colonoscopy it was appropriate to recommend a repeat in 5 years.  New guidelines have changed that to considering it in 7 years.  He does not need a colonoscopy now as long as he is not having active problems.  Place a recall colonoscopy for March 2023  I have CCed his primary care provider and updated the health maintenance section to reflect this

## 2020-10-17 DIAGNOSIS — G4733 Obstructive sleep apnea (adult) (pediatric): Secondary | ICD-10-CM | POA: Diagnosis not present

## 2020-10-17 DIAGNOSIS — R4189 Other symptoms and signs involving cognitive functions and awareness: Secondary | ICD-10-CM | POA: Diagnosis not present

## 2020-10-17 DIAGNOSIS — F5104 Psychophysiologic insomnia: Secondary | ICD-10-CM | POA: Diagnosis not present

## 2020-10-17 DIAGNOSIS — F419 Anxiety disorder, unspecified: Secondary | ICD-10-CM | POA: Diagnosis not present

## 2020-10-17 DIAGNOSIS — Z87820 Personal history of traumatic brain injury: Secondary | ICD-10-CM | POA: Diagnosis not present

## 2020-10-17 DIAGNOSIS — F988 Other specified behavioral and emotional disorders with onset usually occurring in childhood and adolescence: Secondary | ICD-10-CM | POA: Diagnosis not present

## 2020-10-17 DIAGNOSIS — G4761 Periodic limb movement disorder: Secondary | ICD-10-CM | POA: Diagnosis not present

## 2020-10-17 NOTE — Telephone Encounter (Signed)
Thank you Dr. Carlean Purl.   Have informed patient of new guidelines. He understood. Also, put the recall in the system.

## 2020-10-18 ENCOUNTER — Encounter: Payer: Self-pay | Admitting: Family Medicine

## 2020-10-22 DIAGNOSIS — G4761 Periodic limb movement disorder: Secondary | ICD-10-CM | POA: Diagnosis not present

## 2020-10-22 DIAGNOSIS — E611 Iron deficiency: Secondary | ICD-10-CM | POA: Diagnosis not present

## 2020-10-22 DIAGNOSIS — G4733 Obstructive sleep apnea (adult) (pediatric): Secondary | ICD-10-CM | POA: Diagnosis not present

## 2020-10-22 DIAGNOSIS — F5104 Psychophysiologic insomnia: Secondary | ICD-10-CM | POA: Diagnosis not present

## 2020-10-22 DIAGNOSIS — Z6825 Body mass index (BMI) 25.0-25.9, adult: Secondary | ICD-10-CM | POA: Diagnosis not present

## 2020-10-30 ENCOUNTER — Encounter: Payer: Self-pay | Admitting: Family Medicine

## 2020-10-30 DIAGNOSIS — D649 Anemia, unspecified: Secondary | ICD-10-CM

## 2020-11-01 DIAGNOSIS — G4733 Obstructive sleep apnea (adult) (pediatric): Secondary | ICD-10-CM | POA: Diagnosis not present

## 2020-11-07 ENCOUNTER — Other Ambulatory Visit: Payer: Self-pay | Admitting: Cardiology

## 2020-11-10 ENCOUNTER — Other Ambulatory Visit: Payer: Self-pay | Admitting: Internal Medicine

## 2020-11-14 ENCOUNTER — Encounter: Payer: Self-pay | Admitting: Family Medicine

## 2020-11-14 ENCOUNTER — Other Ambulatory Visit: Payer: Self-pay

## 2020-11-14 ENCOUNTER — Ambulatory Visit: Payer: BC Managed Care – PPO | Admitting: Family Medicine

## 2020-11-14 VITALS — BP 124/75 | HR 76 | Ht 73.0 in | Wt 182.4 lb

## 2020-11-14 DIAGNOSIS — G4700 Insomnia, unspecified: Secondary | ICD-10-CM | POA: Diagnosis not present

## 2020-11-14 DIAGNOSIS — D649 Anemia, unspecified: Secondary | ICD-10-CM | POA: Diagnosis not present

## 2020-11-14 DIAGNOSIS — F909 Attention-deficit hyperactivity disorder, unspecified type: Secondary | ICD-10-CM

## 2020-11-14 DIAGNOSIS — H5789 Other specified disorders of eye and adnexa: Secondary | ICD-10-CM | POA: Diagnosis not present

## 2020-11-14 MED ORDER — PREDNISOLONE ACETATE 1 % OP SUSP: 1.0000 [drp] | Freq: Four times a day (QID) | OPHTHALMIC | 0 refills | Status: AC | PRN

## 2020-11-14 MED ORDER — AMPHETAMINE-DEXTROAMPHETAMINE 30 MG PO TABS: 30.0000 mg | ORAL_TABLET | Freq: Every day | ORAL | 0 refills | Status: AC

## 2020-11-14 MED ORDER — BUPROPION HCL 75 MG PO TABS: ORAL_TABLET | ORAL | 6 refills | Status: DC

## 2020-11-14 NOTE — Progress Notes (Signed)
Office Visit Note   Patient: Phillip [name]           Date of Birth: 10/11/1955           MRN: [id number] Visit Date: 11/14/2020 Requested by: [name], MD [address] [place],  [place] [address] PCP: [name], MD  Subjective: Chief Complaint  Patient presents with   Other    Eyes have been red, watery and blurry in the mornings - can't read for a little while in the mornings. Has been going on for at least 2 weeks. Has tried otc eye drops. Some better today. Has a few other issues, one of which is his memory.    HPI: Here with a couple concerns.  For the past 2 weeks he has had redness in both of his eyes, left greater than right.  A week prior to that he had cold symptoms, tested negative for COVID.  In the last 2 days his symptoms are improving.  He tried over-the-counter allergy eyedrops with slight improvement.  No pain in his eyes, no minimally of blurry vision in the mornings.  His sleep specialist suggested trying Wellbutrin for depression at the lowest dosage.  Patient is interested in doing this.  Because of its activating effects, a sleep specialist is hopeful that this will allow Sarkis to take a lower dosage of Adderall.  He also needs refills of Adderall.                ROS:   All other systems were reviewed and are negative.  Objective: Vital Signs: BP 124/75   Pulse 76   Ht 6\' 1"  (1.854 m)   Wt 182 lb 6.4 oz (82.7 kg)   BMI 24.06 kg/m   Physical Exam:  General:  Alert and oriented, in no acute distress. Pulm:  Breathing unlabored. Psy:  Normal mood, congruent affect.  Eyes: He has mild conjunctival erythema on the left, the right side looks normal.    Imaging: No results found.  Assessment & Plan: Resolving bilateral conjunctivitis, probably viral -If symptoms recur, Pred forte eyedrops for a few days.  2.  Depression - Start Wellbutrin 75 mg in the morning and 75 mg at noon.  3.  ADHD - Refilled Adderall for the next 3  months.     Procedures: No procedures performed        PMFS History: Patient Active Problem List   Diagnosis Date Noted   Exertional chest pain 06/06/2020   Carotid bruit 01/04/2020   History of tobacco abuse 12/20/2019   Mild emphysema ([place]) 05/27/2019   Essential hypertension 08/11/2018   Statin myopathy 08/11/2018   Coronary artery disease involving native coronary artery of native heart with angina pectoris ([place]) 07/18/2018   CAD S/P percutaneous coronary angioplasty 06/30/2018   Abnormal computed tomography angiography (CTA)    Hyperlipidemia with target LDL less than 70 05/22/2018   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   Chronic prescription benzodiazepine use 09/14/2017   Chronic insomnia 09/14/2017   Accidental testosterone overdose 03/16/2017   Erectile dysfunction 12/09/2016   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   Encephalopathy, traumatic 10/08/2015   RMSF [facility] spotted fever) 10/08/2015   Raynaud's disease 10/08/2015   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 Synergy DES 2.75 x 38 -- 3.0 mm).  mCx ~60% - med Rx, mRCA 30% & 40%.  ER 55-60%.   Cellulitis    Chronic traumatic encephalopathy    right frontal-temproal lobe   Complication of anesthesia    " BRAIN FOG "   Depression    Erectile dysfunction 11/2016   follows with urology; sildenafil prescribed   Hx of adenomatous colonic polyps 07/2014   2 diminutive  adenomas   Meningoencephalitis 1994   [place]   Pneumonia    Primary hypogonadism in male    Raynaud's disease 16/02/9603   As complication of RMSF   Restless legs 09/13/2015   [facility] spotted fever 2017   TBI (traumatic brain injury) ([place]) 1997, 2009   "concussions"   Vertebral artery stenosis, asymptomatic, right    Likely atretic; large dominant left vertebral artery noted.    Family History  Problem Relation Age of Onset   Hyperlipidemia Father    Heart disease Maternal Grandfather 44   Cancer Paternal Grandfather    Mental illness Mother    Mental illness Brother     Past Surgical History:  Procedure Laterality Date   COLONOSCOPY  07/2014   2 diminutive adenomas   CORONARY STENT INTERVENTION N/A 06/30/2018   Procedure: CORONARY STENT INTERVENTION;  Surgeon: [name], MD;  Location: [facility];;  p-mLAD2 80% & mLAD 70% (tandem lesions) - DES PCI Synergy DES 2.75 x 38 (3.0 mm).   CT CTA CORONARY W/CA SCORE W/CM &/OR WO/CM  06/2018   Cardiac cath score read 1318.  Moderate coronary disease sent for FFR: mRCA 0.84 (non-significant), mLAD 0.64 (signifiant), pLOM(Cx) - 0,72 (significant - but distal lesion)   LEFT HEART CATH AND CORONARY ANGIOGRAPHY N/A 06/30/2018   Procedure: LEFT HEART CATH AND CORONARY ANGIOGRAPHY;  Surgeon: [name], MD;  Location: [facility];;  pLAD1 55% (not significant), p-mLAD2 80% & mLAD 70% (tandem lesions -> DES PCI). mCx ~60% (med Rx), mRCA 30% & 40%.  ER 55-60%.    TEE WITHOUT CARDIOVERSION N/A 09/06/2015   Procedure: TRANSESOPHAGEAL ECHOCARDIOGRAM (TEE);  Surgeon: Jerline Pain, MD;  R/O endocarditis;   TONSILLECTOMY  1965   Social History   Occupational History   Occupation: Retired  Tobacco Use   Smoking status: Former    Packs/day: 1.50    Years: 25.00    Pack years: 37.50    Types: Cigarettes    Quit date: 11/22/2014    Years since quitting: 5.9   Smokeless tobacco: Never   Tobacco comments:     Encouraged to remain smoke free  Vaping Use   Vaping Use: Never used  Substance and Sexual Activity   Alcohol use: No    Alcohol/week: 0.0 standard drinks   Drug use: No   Sexual activity: Yes    Partners: Female

## 2020-11-15 ENCOUNTER — Telehealth: Payer: Self-pay | Admitting: Family Medicine

## 2020-11-15 LAB — CBC WITH DIFFERENTIAL/PLATELET
Absolute Monocytes: 628 cells/uL (ref 200–950)
Basophils Absolute: 41 cells/uL (ref 0–200)
Basophils Relative: 0.6 %
Eosinophils Absolute: 131 cells/uL (ref 15–500)
Eosinophils Relative: 1.9 %
HCT: 43.4 % (ref 38.5–50.0)
Hemoglobin: 14.4 g/dL (ref 13.2–17.1)
Lymphs Abs: 2049 cells/uL (ref 850–3900)
MCH: 30.3 pg (ref 27.0–33.0)
MCHC: 33.2 g/dL (ref 32.0–36.0)
MCV: 91.4 fL (ref 80.0–100.0)
MPV: 9.1 fL (ref 7.5–12.5)
Monocytes Relative: 9.1 %
Neutro Abs: 4050 cells/uL (ref 1500–7800)
Neutrophils Relative %: 58.7 %
Platelets: 301 10*3/uL (ref 140–400)
RBC: 4.75 10*6/uL (ref 4.20–5.80)
RDW: 12.8 % (ref 11.0–15.0)
Total Lymphocyte: 29.7 %
WBC: 6.9 10*3/uL (ref 3.8–10.8)

## 2020-11-15 LAB — IRON,TIBC AND FERRITIN PANEL
%SAT: 24 % (calc) (ref 20–48)
Ferritin: 37 ng/mL (ref 24–380)
Iron: 99 ug/dL (ref 50–180)
TIBC: 413 mcg/dL (calc) (ref 250–425)

## 2020-11-15 NOTE — Telephone Encounter (Signed)
Anemia labs look good, holding steady.

## 2020-11-23 ENCOUNTER — Encounter: Payer: Self-pay | Admitting: Internal Medicine

## 2020-11-23 ENCOUNTER — Other Ambulatory Visit: Payer: Self-pay

## 2020-11-23 ENCOUNTER — Ambulatory Visit (INDEPENDENT_AMBULATORY_CARE_PROVIDER_SITE_OTHER): Payer: BC Managed Care – PPO | Admitting: Internal Medicine

## 2020-11-23 VITALS — BP 128/78 | HR 66 | Ht 73.0 in | Wt 183.6 lb

## 2020-11-23 DIAGNOSIS — N529 Male erectile dysfunction, unspecified: Secondary | ICD-10-CM

## 2020-11-23 DIAGNOSIS — E230 Hypopituitarism: Secondary | ICD-10-CM | POA: Diagnosis not present

## 2020-11-23 NOTE — Patient Instructions (Addendum)
Please continue testosterone gel - 5 g daily at night.  Please come back for labs fasting, between 8-9 am, in 06/2021.  Stop anything with Biotin 1 week before next labs.  Please come back for a follow-up appointment in 1 year.

## 2020-11-23 NOTE — Progress Notes (Signed)
Patient ID: Phillip [name], male   DOB: 03-28-1956, 65 y.o.   MRN: [id number]   This visit occurred during the SARS-CoV-2 public health emergency.  Safety protocols were in place, including screening questions prior to the visit, additional usage of staff PPE, and extensive cleaning of exam room while observing appropriate contact time as indicated for disinfecting solutions.   HPI: Phillip [name] is a 65 y.o.-year-old man, initially referred by his neurologist, Dr. [name], presenting for follow-up for hypogonadism.  Last visit 7 months ago.  Interim history: Last year, he started on the [place] diet (06/2019): Diet, exercise, stress management.  He joined the program in [place], initially commuting there, but afterwards going only sporadically.  As of now, he is not going in person but tries to follow the diet at home.  He reads labels and tries to stay on a low-fat, low-salt, low sugar diet. Since last visit with switched to testosterone gel 5 g daily. He feels well on it, without complaints. He continues to have ED (managed by urology), insomnia, memory loss, depression.  He was just started on Wellbutrin 11/14/2020.  He is also on Adderall. He was also started on 300 mg Gabapentin for Periodic Limb Movement - 1 mo ago. He does not feel a difference yet.  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  He was given refills of generic sildenafil. 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 He had a subsequent visit with his PCP in 09/2015 >> a testosterone level was found to be extremely low.   Received records from urology >>  reviewed: Patient's initial free testosterone level obtained by PCP was 0.4 pg/mL (09/17/2015). At that time, he complained of low libido, testicular atrophy, difficulty obtaining and maintaining an erection. A repeat testosterone level obtained by an integrative medicine provider in [place] (09/25/2015) showed again very low testosterone level: Total Testosterone <3 ng/dL, and the free testosterone of 0.2 ng/dL. DHEAS was 122.4 (48.9-344.2). Of note, no LH and FSH levels were drawn at that time. At that point, he was started on testosterone cypionate 8 IM 0.8 mL every other week. This has not improved his libido significantly, but it helped him obtaining morning erections.  A genital exam was performed on 11/09/2015 by his urologist: Atrophic left and right testes. No tenderness, swelling, masses, varicocele, or hydrocele. No abnormality of the penis. Circumcised status. Prostate: 2+ in size, 40 g. Normal consistency  Dr. Diona Fanti obtain the following labs - 11/09/2015 - collection time: 3:34 PM  Total testosterone 1125.7 (300-890) Free testosterone 175.3 (47-244) SHBG 66.3 (10-57)  FSH 0.1, LH 0.1 Prolactin 6.3 Of note, at the time of the above collection, patient was taking 0.8 mL testosterone every other week in his deltoid. The dose was changed at that time to 0.5 mL every week in his quadriceps, after which his fatigue improved a little, and he had more energy.   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 He saw Dr. [name], who obtained a  brain  + pituitary MRI (10/24/2015). This showed chronic encephalomalacia in the right fronto- temporal lobe and mild generalized  cortical atrophy with scattered hyperintense foci that indicate chronic microvascular change. There was no abnormality in the pituitary gland.  He admits for previous decreased libido >> normal now Had difficulty obtaining or maintaining an erection >> not anymore No trauma  to testes, testicular irradiation or surgery No h/o of mumps orchitis/h/o autoimmune ds. No h/o cryptorchidism He grew and went through puberty like his peers + shrinking of testes. No very small testes (<5 ml) No incomplete/delayed sexual development     No breast discomfort/gynecomastia    No loss of body hair (axillary/pubic)/decreased need for shaving No height loss No abnormal sense of smell  + hot flushes, resolved No vision problems, other than blurry vision in R eye, also photopsia - started 11/2015 No worst HA of his life He does have a history of head trauma in 1997, when a tree fell on top of his head and he lost consciousness for more than 30 minutes; in 2009 he fell off ladder and landed on back of his head No FH of hypogonadism/infertility No personal h/o infertility - has 2 children: 10 and 41 years old  No FH of hemochromatosis or pituitary tumors No excessive weight gain or loss.  No chronic diseases, but recently diagnosed with RMSF Spring 2017 No chronic pain. Not on opiates, does not take steroids, had a course this spring - ~1 week He was drinking Alcohol: ~2 drinks a day up to 2015 >>  stopped completely since then No anabolic steroids use No herbal medicines. Not on antidepressants  No AI ds in his family, no FH of MS. He does not have family history of early cardiac disease.  Reviewed pertinent labs:  After switching to the testosterone gel: Component     Latest Ref Rng & Units 07/02/2020  Testosterone, Serum (Total)     ng/dL 724  % Free Testosterone     % 0.8  Free Testosterone, S     pg/mL 58  Sex Hormone Binding Globulin     nmol/L 65.9   Component     Latest Ref Rng & Units 02/10/2020 04/12/2020  Testosterone, Serum (Total)     ng/dL 749 966 (H)  % Free Testosterone     % 0.6 0.4  Free Testosterone, S     pg/mL 45 (L) 39 (L)  Sex Hormone Binding Globulin     nmol/L 78.1 (H) 95.7 (H)   Component     Latest Ref Rng & Units 10/25/2018   Testosterone, Serum (Total)     ng/dL 813  % Free Testosterone     % 1.3  Free Testosterone, S     pg/mL 106  Sex Hormone Binding Globulin     nmol/L 59.2  Estradiol, Free     pg/mL 0.46 (H)  Estradiol     ADULTS: < OR = 0.45 pg/mL 22  PSA     0.10 - 4.00 ng/mL 0.51   Component     Latest Ref Rng & Units 07/20/2017 10/12/2017 12/22/2017  Testosterone, Serum (Total)     ng/dL  718   % Free Testosterone     %  0.8   Free Testosterone, S     pg/mL  57   Sex Hormone Binding Globulin     nmol/L  83.5 (H)   Testosterone     264 - 916 ng/dL 350    Testosterone Free     7.2 - 24.0 pg/mL 4.1 (L)    Sex Horm Binding Glob, Serum  19.3 - 76.4 nmol/L 100.7 (H)    Estradiol, Free     pg/mL   0.38  Estradiol     pg/mL   19   Initially: Component     Latest Ref Rng & Units 01/28/2016  Testosterone     264 - 916 ng/dL 1,103 (H)  Testosterone Free     7.2 - 24.0 pg/mL 15.1  Sex Horm Binding Glob, Serum     19.3 - 76.4 nmol/L 80.8 (H)  TSH     0.35 - 4.50 uIU/mL 1.90  Triiodothyronine,Free,Serum     2.3 - 4.2 pg/mL 3.9  T4,Free(Direct)     0.60 - 1.60 ng/dL 0.80  FSH     1.4 - 18.1 mIU/ML 0.1 (L)  Cortisol, Plasma     ug/dL 13.1  LH     1.50 - 9.30 mIU/mL 0.03 (L)  hCG Quant     0 - 3 mIU/mL <1   Component     Latest Ref Rng & Units 01/28/2016  IGF-I, LC/MS     50 - 317 ng/mL 140  Z-Score (Male)     -2.0 - 2.0 SD 0.1  Estradiol, Free     ADULTS: < OR = 0.45 pg/mL 1.06 (H)  Estradiol     ADULTS: < OR = 29 pg/mL 53 (H)  Prolactin     2.0 - 18.0 ng/mL 8.1   PSA levels were normal: Lab Results  Component Value Date   PSA 0.51 02/10/2020   PSA 0.51 10/25/2018   PSA 0.80 12/22/2017   PSA 0.59 12/19/2016   PSA 0.72 12/10/2016   PSA 0.57 04/12/2014   PSA 0.57 03/16/2013   His hematocrit was normal:: Lab Results  Component Value Date   HCT 43.4 11/14/2020   Patient has a history of a coronary stent placed in 06/2018 - after he had an elevated CAC score,  after which he had to come off testosterone transiently.  With cardiology consent, we restarted testosterone supplement at 0.3 mL weekly as he felt poorly off testosterone.  He was in the emergency room 06/2019 atypical chest pain.  He was started on Repatha 07/2019.  He takes Ambien to help him sleep.  ROS:   Constitutional: no weight gain/no weight loss, + fatigue, no subjective hyperthermia, no subjective hypothermia, + insomnia (periodic limb movement) Eyes: no blurry vision, no xerophthalmia ENT: no sore throat, no nodules palpated in neck, no dysphagia, no odynophagia, no hoarseness Cardiovascular: no CP/no SOB/no palpitations/no leg swelling Respiratory: no cough/no SOB/no wheezing Gastrointestinal: no N/no V/no D/no C/no acid reflux Musculoskeletal: no muscle aches/no joint aches Skin: no rashes, no hair loss Neurological: no tremors/no numbness/no tingling/no dizziness  I reviewed pt's medications, allergies, PMH, social hx, family hx, and changes were documented in the history of present illness. Otherwise, unchanged from my initial visit note.  Past Medical History:  Diagnosis Date   ADD (attention deficit disorder)    on adderrall managed by neurology   Anxiety    on ativan managed by Neurology   Atrophy, 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 Synergy DES 2.75 x 38 -- 3.0 mm).  mCx ~60% - med Rx, mRCA 30% & 40%.  ER 55-60%.   Cellulitis    Chronic traumatic encephalopathy  right frontal-temproal lobe   Complication of anesthesia    " BRAIN FOG "   Depression    Erectile dysfunction 11/2016   follows with urology; sildenafil prescribed   Hx of adenomatous colonic polyps 07/2014   2 diminutive adenomas   Meningoencephalitis 1994   Equ[facility]   Pneumonia    Primary  hypogonadism in male    Raynaud's disease [id number]   As complication of RMSF   Restless legs 09/13/2015   [facility] spotted fever 2017   TBI (traumatic brain injury) ([place]) 1997, 2009   "concussions"   Vertebral artery stenosis, asymptomatic, right    Likely atretic; large dominant left vertebral artery noted.   Past Surgical History:  Procedure Laterality Date   COLONOSCOPY  07/2014   2 diminutive adenomas   CORONARY STENT INTERVENTION N/A 06/30/2018   Procedure: CORONARY STENT INTERVENTION;  Surgeon: [name], MD;  Location: [facility];;  p-mLAD2 80% & mLAD 70% (tandem lesions) - DES PCI Synergy DES 2.75 x 38 (3.0 mm).   CT CTA CORONARY W/CA SCORE W/CM &/OR WO/CM  06/2018   Cardiac cath score read 1318.  Moderate coronary disease sent for FFR: mRCA 0.84 (non-significant), mLAD 0.64 (signifiant), pLOM(Cx) - 0,72 (significant - but distal lesion)   LEFT HEART CATH AND CORONARY ANGIOGRAPHY N/A 06/30/2018   Procedure: LEFT HEART CATH AND CORONARY ANGIOGRAPHY;  Surgeon: [name], MD;  Location: [facility];;  pLAD1 55% (not significant), p-mLAD2 80% & mLAD 70% (tandem lesions -> DES PCI). mCx ~60% (med Rx), mRCA 30% & 40%.  ER 55-60%.    TEE WITHOUT CARDIOVERSION N/A 09/06/2015   Procedure: TRANSESOPHAGEAL ECHOCARDIOGRAM (TEE);  Surgeon: Jerline Pain, MD;  R/O endocarditis;   TONSILLECTOMY  1965   Social History   Social History   Marital status: Single    Spouse name: N/A   Number of children: 2   Occupational History   n/a   Social History Main Topics   Smoking status: Former Smoker    Packs/day: 1.50    Years: 25.00    Types: Cigarettes    Quit date: 2002   Smokeless tobacco: Never Used     Comment: Encouraged to remain smoke free   Alcohol use No     Comment: 8 drinks   Drug use: No   Social History Narrative   Divorced. Education: [employer].   Current Outpatient Medications on File Prior to Visit  Medication Sig Dispense Refill    Alpha-Lipoic Acid 300 MG CAPS Take by mouth.     amLODipine (NORVASC) 5 MG tablet Take 1 tablet (5 mg total) by mouth 2 (two) times daily. 90 tablet 3   amphetamine-dextroamphetamine (ADDERALL) 30 MG tablet Take 1-2 tablets by mouth daily. 60 tablet 0   b complex vitamins capsule Take 1 tablet by mouth daily.     buPROPion (WELLBUTRIN) 75 MG tablet 1 PO q AM and q noon 60 tablet 6   Cholecalciferol 125 MCG (5000 UT) TABS      clopidogrel (PLAVIX) 75 MG tablet TAKE 1 TABLET BY MOUTH EVERY DAY WITH BREAKFAST 90 tablet 1   Coenzyme Q10 100 MG capsule Take by mouth.     gabapentin (NEURONTIN) 100 MG capsule Take by mouth.     Iron-Vitamin C (VITRON-C) 65-125 MG TABS Take by mouth.     magnesium oxide (MAG-OX) 400 MG tablet Take by mouth.     Melatonin 10 MG CAPS Take by mouth.  Menaquinone-7 (VITAMIN K2) 100 MCG CAPS      Misc Natural Products (PROSTATE SUPPORT PO) Take 1 capsule by mouth every evening. Prostate Plus     nitroGLYCERIN (NITROSTAT) 0.4 MG SL tablet PLACE 1 TABLET UNDER TONGUE EVERY 5 MINS AS NEEDED FOR CHEST PAIN 75 tablet 2   Omega-3 Fatty Acids (SUPER OMEGA 3 EPA/DHA PO) Take 1 capsule by mouth daily.     prednisoLONE acetate (PRED FORTE) 1 % ophthalmic suspension Place 1 drop into both eyes 4 (four) times daily as needed. Stop after 3-5 days. 5 mL 0   Probiotic Product ([employer] PROBIOTIC) CAPS      REPATHA SURECLICK [id number] MG/ML SOAJ INJECT 140 MG INTO THE SKIN EVERY 14 (FOURTEEN) DAYS. 2 mL 11   testosterone (ANDROGEL) 50 MG/5GM (1%) GEL PLACE 5 GRAMS ONTO THE SKIN DAILY. 450 g 3   Theanine 50 MG TBDP Take by mouth.     vitamin B-12 (CYANOCOBALAMIN) 500 MCG tablet Take by mouth.     vitamin E 180 MG (400 UNITS) capsule Take by mouth.     zinc gluconate 50 MG tablet Take by mouth.     No current facility-administered medications on file prior to visit.   Allergies  Allergen Reactions   Penicillins Hives    Did it involve swelling of the face/tongue/throat, SOB, or low BP?  Unknown Did it involve sudden or severe rash/hives, skin peeling, or any reaction on the inside of your mouth or nose? Yes Did you need to seek medical attention at a hospital or doctor's office? Unknown When did it last happen? Occurred at age 30 or 59 If all above answers are "NO", may proceed with cephalosporin use.    Statins Other (See Comments)    confusion   Methylprednisolone Other (See Comments)    Unsure of exact reaction type   Penicillin G Other (See Comments)   Amphetamines Other (See Comments)    Intolerant to specific formulations: [employer] amphetamine TEVA dextroamp amphetamine   Lorazepam Other (See Comments)    Mylan lorazepam; others ok   Propofol Other (See Comments)    Cognitive delay and emotional   Family History  Problem Relation Age of Onset   Hyperlipidemia Father    Heart disease Maternal Grandfather 43   Cancer Paternal Grandfather    Mental illness Mother    Mental illness Brother    PE: BP 128/78 (BP Location: Right Arm, Patient Position: Sitting, Cuff Size: Normal)   Pulse 66   Ht '6\' 1"'$  (1.854 m)   Wt 183 lb 9.6 oz (83.3 kg)   SpO2 97%   BMI 24.22 kg/m  Wt Readings from Last 3 Encounters:  11/23/20 183 lb 9.6 oz (83.3 kg)  11/14/20 182 lb 6.4 oz (82.7 kg)  07/11/20 185 lb (83.9 kg)   Constitutional: normal weight, in NAD Eyes: PERRLA, EOMI, no exophthalmos ENT: moist mucous membranes, no thyromegaly, no cervical lymphadenopathy Cardiovascular: RRR, No MRG Respiratory: CTA B Gastrointestinal: abdomen soft, NT, ND, BS+ Musculoskeletal: no deformities, strength intact in all 4 Skin: moist, warm, no rashes Neurological: no tremor with outstretched hands, DTR normal in all 4  ASSESSMENT: 1.  Hypogonadotropic hypogonadism - possible reasons for his very low initial testosterone: 1. Labs were drawn soon after diagnosis of RMSF, which, for him, was a fulminant, systemic, disease. Usually, the pituitary gland will shunt the production of  testosterone or other hormones deemed "unnecessary" during acute illness. Testosterone levels could be quite low in this situation. 2. Labs  were drawn also after a prednisone course, but I do not have a clear time sequence 3. He has a history of head trauma x2, which could have caused pituitary insufficiency 4. He has a history of alcohol use, however, he relates that he stopped in 2015  -Reviewed visit note from Dr. 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  He was given refills of generic sildenafil. -Was advised to continue 0.3 cc of testosterone cypionate weekly (200 mg / 1 mL) -On genital exam, he had mildly atrophic right and left testicles and a prostate of approximately 30 g,  without abnormalities  2. ED  PLAN:  1. Hypogonadism -Patient with history of hypogonadotrophic hypogonadism, diagnosed approximately 6 years ago, during an episode of RMSF, during which his testosterone was checked and it was in the castrate range.  He was started on testosterone afterwards and referred to me for further investigation.  At that time, pituitary hormones and pituitary MRI were normal.  LH and FSH were low, as expected, on testosterone treatment.  His hypothalamic-pituitary-testicular axis improved afterwards, but did not recover completely so he continues on testosterone supplement.  He was  on 0.3 mL testosterone weekly.  We tried to decrease the dose to 0.2 mL weekly but he could not tolerate it due to increased fatigue. At last visit he was describing fatigue and did not like the peaks and troughs given by the testosterone injection.  We did discuss about possibly switching to testosterone gel, which gives him less variability in testosterone level.  He agreed to try this.  I suggested 5 g daily and titrate based on labs in 1.5 to 2 months afterwards.  Latest testosterone level reviewed from 06/2020 was normal. -He feels well on the  above regimen, without complaints.  No decreased libido or problems with erections.  He does not feel  peaks and troughs of his testosterone levels anymore. -He does have a history of coronary artery disease.  He had an ED visit in 06/2019 for chest pain, but MI was ruled out.  He was started on PCSK9 inhibitor.  Of note, he also had a coronary stent placed 06/2018.  He was initially off testosterone afterwards, but we restarted testosterone in 08/2018 per Dr. Allison Quarry consent.  No recent chest pain/shortness of breath. -He had a history of slightly elevated estrogen in the past, most likely due to the testosterone treatment. -His most recent CBC and PSA were normal: Lab Results  Component Value Date   HGB 14.4 11/14/2020    Lab Results  Component Value Date   PSA 0.51 02/10/2020   PSA 0.51 10/25/2018   PSA 0.80 12/22/2017  -We will not repeat these today; he has an appointment coming up with his urologist next week and he will have a DRE and a PSA at that time. -We will repeat a testosterone level in 06/2021 -I will see him back in 1 year  2. ED -This has a vascular origin, per notes reviewed from Dr. Diona Fanti -He is on PDE 5 inhibitor per urology.  This is tolerated well.  Orders Placed This Encounter  Procedures   Testosterone, Free, Total, SHBG    [name], MD PhD [facility]ocr[facility]

## 2020-12-02 DIAGNOSIS — G4733 Obstructive sleep apnea (adult) (pediatric): Secondary | ICD-10-CM | POA: Diagnosis not present

## 2020-12-10 ENCOUNTER — Other Ambulatory Visit: Payer: Self-pay | Admitting: Family Medicine

## 2020-12-11 DIAGNOSIS — M9901 Segmental and somatic dysfunction of cervical region: Secondary | ICD-10-CM | POA: Diagnosis not present

## 2020-12-11 DIAGNOSIS — M9903 Segmental and somatic dysfunction of lumbar region: Secondary | ICD-10-CM | POA: Diagnosis not present

## 2020-12-11 DIAGNOSIS — M9905 Segmental and somatic dysfunction of pelvic region: Secondary | ICD-10-CM | POA: Diagnosis not present

## 2020-12-11 DIAGNOSIS — M9902 Segmental and somatic dysfunction of thoracic region: Secondary | ICD-10-CM | POA: Diagnosis not present

## 2020-12-26 DIAGNOSIS — M9901 Segmental and somatic dysfunction of cervical region: Secondary | ICD-10-CM | POA: Diagnosis not present

## 2020-12-26 DIAGNOSIS — M9905 Segmental and somatic dysfunction of pelvic region: Secondary | ICD-10-CM | POA: Diagnosis not present

## 2020-12-26 DIAGNOSIS — M9903 Segmental and somatic dysfunction of lumbar region: Secondary | ICD-10-CM | POA: Diagnosis not present

## 2020-12-26 DIAGNOSIS — M9902 Segmental and somatic dysfunction of thoracic region: Secondary | ICD-10-CM | POA: Diagnosis not present

## 2021-01-01 DIAGNOSIS — E611 Iron deficiency: Secondary | ICD-10-CM | POA: Diagnosis not present

## 2021-01-01 DIAGNOSIS — G4733 Obstructive sleep apnea (adult) (pediatric): Secondary | ICD-10-CM | POA: Diagnosis not present

## 2021-01-01 DIAGNOSIS — G4761 Periodic limb movement disorder: Secondary | ICD-10-CM | POA: Diagnosis not present

## 2021-01-01 DIAGNOSIS — Z6824 Body mass index (BMI) 24.0-24.9, adult: Secondary | ICD-10-CM | POA: Diagnosis not present

## 2021-01-01 DIAGNOSIS — F5104 Psychophysiologic insomnia: Secondary | ICD-10-CM | POA: Diagnosis not present

## 2021-01-02 DIAGNOSIS — G4733 Obstructive sleep apnea (adult) (pediatric): Secondary | ICD-10-CM | POA: Diagnosis not present

## 2021-01-03 DIAGNOSIS — F902 Attention-deficit hyperactivity disorder, combined type: Secondary | ICD-10-CM | POA: Diagnosis not present

## 2021-01-10 DIAGNOSIS — F902 Attention-deficit hyperactivity disorder, combined type: Secondary | ICD-10-CM | POA: Diagnosis not present

## 2021-01-14 DIAGNOSIS — F902 Attention-deficit hyperactivity disorder, combined type: Secondary | ICD-10-CM | POA: Diagnosis not present

## 2021-01-31 DIAGNOSIS — F902 Attention-deficit hyperactivity disorder, combined type: Secondary | ICD-10-CM | POA: Diagnosis not present

## 2021-02-01 DIAGNOSIS — G4733 Obstructive sleep apnea (adult) (pediatric): Secondary | ICD-10-CM | POA: Diagnosis not present

## 2021-02-05 DIAGNOSIS — F902 Attention-deficit hyperactivity disorder, combined type: Secondary | ICD-10-CM | POA: Diagnosis not present

## 2021-02-15 DIAGNOSIS — F902 Attention-deficit hyperactivity disorder, combined type: Secondary | ICD-10-CM | POA: Diagnosis not present

## 2021-02-22 DIAGNOSIS — F902 Attention-deficit hyperactivity disorder, combined type: Secondary | ICD-10-CM | POA: Diagnosis not present

## 2021-02-26 ENCOUNTER — Telehealth: Payer: Self-pay | Admitting: Family Medicine

## 2021-02-26 DIAGNOSIS — F902 Attention-deficit hyperactivity disorder, combined type: Secondary | ICD-10-CM | POA: Diagnosis not present

## 2021-02-26 NOTE — Telephone Encounter (Signed)
Records emailed to [place]

## 2021-03-01 DIAGNOSIS — F902 Attention-deficit hyperactivity disorder, combined type: Secondary | ICD-10-CM | POA: Diagnosis not present

## 2021-03-04 DIAGNOSIS — G4733 Obstructive sleep apnea (adult) (pediatric): Secondary | ICD-10-CM | POA: Diagnosis not present

## 2021-03-12 DIAGNOSIS — F902 Attention-deficit hyperactivity disorder, combined type: Secondary | ICD-10-CM | POA: Diagnosis not present

## 2021-03-13 DIAGNOSIS — E291 Testicular hypofunction: Secondary | ICD-10-CM | POA: Diagnosis not present

## 2021-03-13 DIAGNOSIS — R948 Abnormal results of function studies of other organs and systems: Secondary | ICD-10-CM | POA: Diagnosis not present

## 2021-03-15 DIAGNOSIS — F902 Attention-deficit hyperactivity disorder, combined type: Secondary | ICD-10-CM | POA: Diagnosis not present

## 2021-03-20 ENCOUNTER — Encounter: Payer: Self-pay | Admitting: Internal Medicine

## 2021-03-20 DIAGNOSIS — E291 Testicular hypofunction: Secondary | ICD-10-CM | POA: Diagnosis not present

## 2021-03-20 DIAGNOSIS — N5201 Erectile dysfunction due to arterial insufficiency: Secondary | ICD-10-CM | POA: Diagnosis not present

## 2021-03-21 DIAGNOSIS — F902 Attention-deficit hyperactivity disorder, combined type: Secondary | ICD-10-CM | POA: Diagnosis not present

## 2021-03-22 ENCOUNTER — Other Ambulatory Visit: Payer: Self-pay

## 2021-03-22 ENCOUNTER — Encounter: Payer: Self-pay | Admitting: Internal Medicine

## 2021-03-22 ENCOUNTER — Ambulatory Visit (INDEPENDENT_AMBULATORY_CARE_PROVIDER_SITE_OTHER): Payer: BC Managed Care – PPO | Admitting: Internal Medicine

## 2021-03-22 VITALS — BP 130/72 | HR 73 | Ht 73.0 in | Wt 182.6 lb

## 2021-03-22 DIAGNOSIS — E230 Hypopituitarism: Secondary | ICD-10-CM

## 2021-03-22 DIAGNOSIS — N529 Male erectile dysfunction, unspecified: Secondary | ICD-10-CM | POA: Diagnosis not present

## 2021-03-22 NOTE — Progress Notes (Addendum)
Patient ID: Phillip [name], male   DOB: 29-Jun-1955, 65 y.o.   MRN: [id number]   This visit occurred during the SARS-CoV-2 public health emergency.  Safety protocols were in place, including screening questions prior to the visit, additional usage of staff PPE, and extensive cleaning of exam room while observing appropriate contact time as indicated for disinfecting solutions.   HPI: Phillip [name] is a 65 y.o.-year-old man, initially referred by his neurologist, Dr. [name], presenting for follow-up for hypogonadism.  Last visit 4 months ago.  Interim history: Before last visit, he switched from testosterone injections to gel 5 g daily.  At last visit, 4 months ago, he felt well, without complaints.  However, he contacted me yesterday after he had a low testosterone in the urologist office.  He mention:  "I am experiencing the same severe symptoms I experienced when I first came to see you when my testosterone was near zero --fatigue, anxiety, worsening Raynauds, etc." He has ED, managed by urology-these improved lately.  In fact, at today's visit he mentions no problems with erections or low libido.  However, he does have insomnia.  He is on Gabapentin for involuntary leg movements. Taking medical marijuana now  - started approximately 1 week before the testoterone level was checked. He mentions missing ~1 dose of testosterone every 2 weeks.  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  He was given refills of generic sildenafil. 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 He had a subsequent visit with his PCP in 09/2015 >> a testosterone level  was found to be extremely low.   Received records from urology >> reviewed: Patient's initial free testosterone level obtained by PCP was 0.4 pg/mL (09/17/2015). At that time, he complained of low libido, testicular atrophy, difficulty obtaining and maintaining an erection. A repeat testosterone level obtained by an integrative medicine provider in [place] (09/25/2015) showed again very low testosterone level: Total Testosterone <3 ng/dL, and the free testosterone of 0.2 ng/dL. DHEAS was 122.4 (48.9-344.2). Of note, no LH and FSH levels were drawn at that time. At that point, he was started on testosterone cypionate 8 IM 0.8 mL every other week. This has not improved his libido significantly, but it helped him obtaining morning erections.  A genital exam was performed on 11/09/2015 by his urologist: Atrophic left and right testes. No tenderness, swelling, masses, varicocele, or hydrocele. No abnormality of the penis. Circumcised status. Prostate: 2+ in size, 40 g. Normal consistency  Dr. Diona Fanti obtain the following labs - 11/09/2015 - collection time: 3:34 PM  Total testosterone 1125.7 (300-890) Free testosterone 175.3 (47-244) SHBG 66.3 (10-57)  FSH 0.1, LH 0.1 Prolactin 6.3 Of note, at the time of the above collection, patient was taking 0.8 mL testosterone every other week in his deltoid. The dose was changed at that time to 0.5 mL every week in his quadriceps, after which his fatigue improved a little, and he had more energy.   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 He saw Dr. [name], who obtained a  brain  + pituitary MRI (10/24/2015). This showed chronic encephalomalacia in the  right fronto- temporal lobe and mild generalized cortical atrophy with scattered hyperintense foci that indicate chronic microvascular change. There was no abnormality in the pituitary gland.  He admits for previous decreased libido >> normal now Had  difficulty obtaining or maintaining an erection >> not anymore No trauma to testes, testicular irradiation or surgery No h/o of mumps orchitis/h/o autoimmune ds. No h/o cryptorchidism He grew and went through puberty like his peers + shrinking of testes. No very small testes (<5 ml) No incomplete/delayed sexual development     No breast discomfort/gynecomastia    No loss of body hair (axillary/pubic)/decreased need for shaving No height loss No abnormal sense of smell  + hot flushes, resolved No vision problems, other than blurry vision in R eye, also photopsia - started 11/2015 No worst HA of his life He does have a history of head trauma in 1997, when a tree fell on top of his head and he lost consciousness for more than 30 minutes; in 2009 he fell off ladder and landed on back of his head No FH of hypogonadism/infertility No personal h/o infertility - has 2 children: 2 and 90 years old  No FH of hemochromatosis or pituitary tumors No excessive weight gain or loss.  No chronic diseases, but recently diagnosed with RMSF Spring 2017 No chronic pain. Not on opiates, does not take steroids, had a course this spring - ~1 week He was drinking Alcohol: ~2 drinks a day up to 2015 >>  stopped completely since then No anabolic steroids use No herbal medicines. Not on antidepressants  No AI ds in his family, no FH of MS. He does not have family history of early cardiac disease.  Reviewed pertinent labs:  Latest levels: 03/13/2021: Total testosterone 207.2 (300-190), free testosterone 29 (47-244) (calculated 28), SHBG 53 (10-57)  Initially after switching to the testosterone gel: Component     Latest Ref Rng & Units 07/02/2020  Testosterone, Serum (Total)     ng/dL 724  % Free Testosterone     % 0.8  Free Testosterone, S     pg/mL 58  Sex Hormone Binding Globulin     nmol/L 65.9   Component     Latest Ref Rng & Units 02/10/2020 04/12/2020  Testosterone, Serum (Total)     ng/dL 749  966 (H)  % Free Testosterone     % 0.6 0.4  Free Testosterone, S     pg/mL 45 (L) 39 (L)  Sex Hormone Binding Globulin     nmol/L 78.1 (H) 95.7 (H)   Component     Latest Ref Rng & Units 10/25/2018  Testosterone, Serum (Total)     ng/dL 813  % Free Testosterone     % 1.3  Free Testosterone, S     pg/mL 106  Sex Hormone Binding Globulin     nmol/L 59.2  Estradiol, Free     pg/mL 0.46 (H)  Estradiol     ADULTS: < OR = 0.45 pg/mL 22  PSA     0.10 - 4.00 ng/mL 0.51   Component     Latest Ref Rng & Units 07/20/2017 10/12/2017 12/22/2017  Testosterone, Serum (Total)     ng/dL  718   % Free Testosterone     %  0.8   Free Testosterone, S     pg/mL  57   Sex Hormone Binding Globulin     nmol/L  83.5 (H)   Testosterone     264 - 916 ng/dL 350  Testosterone Free     7.2 - 24.0 pg/mL 4.1 (L)    Sex Horm Binding Glob, Serum     19.3 - 76.4 nmol/L 100.7 (H)    Estradiol, Free     pg/mL   0.38  Estradiol     pg/mL   19   Initially: Component     Latest Ref Rng & Units 01/28/2016  Testosterone     264 - 916 ng/dL 1,103 (H)  Testosterone Free     7.2 - 24.0 pg/mL 15.1  Sex Horm Binding Glob, Serum     19.3 - 76.4 nmol/L 80.8 (H)  TSH     0.35 - 4.50 uIU/mL 1.90  Triiodothyronine,Free,Serum     2.3 - 4.2 pg/mL 3.9  T4,Free(Direct)     0.60 - 1.60 ng/dL 0.80  FSH     1.4 - 18.1 mIU/ML 0.1 (L)  Cortisol, Plasma     ug/dL 13.1  LH     1.50 - 9.30 mIU/mL 0.03 (L)  hCG Quant     0 - 3 mIU/mL <1   Component     Latest Ref Rng & Units 01/28/2016  IGF-I, LC/MS     50 - 317 ng/mL 140  Z-Score (Male)     -2.0 - 2.0 SD 0.1  Estradiol, Free     ADULTS: < OR = 0.45 pg/mL 1.06 (H)  Estradiol     ADULTS: < OR = 29 pg/mL 53 (H)  Prolactin     2.0 - 18.0 ng/mL 8.1   PSA levels were normal: 03/13/2021: PSA 0.69-urology office Lab Results  Component Value Date   PSA 0.51 02/10/2020   PSA 0.51 10/25/2018   PSA 0.80 12/22/2017   PSA 0.59 12/19/2016   PSA 0.72 12/10/2016    PSA 0.57 04/12/2014   PSA 0.57 03/16/2013   His Hb/hematocrit were normal: Lab Results  Component Value Date   HGB 14.4 11/14/2020   HGB 14.7 05/17/2020   HGB 14.3 07/08/2019   HGB 14.5 06/08/2019   HGB 14.8 07/01/2018   HGB 14.2 07/01/2018   HGB 14.7 06/28/2018   HGB 14.7 12/15/2017   HGB 14.6 12/19/2016   HGB 14.5 12/10/2016   Lab Results  Component Value Date   HCT 43.4 11/14/2020   Patient has a history of a coronary stent placed in 06/2018 - after he had an elevated CAC score, after which he had to come off testosterone transiently.  With cardiology consent, we restarted testosterone supplement at 0.3 mL weekly as he felt poorly off testosterone. He was in the emergency room 06/2019 atypical chest pain.  He was started on Repatha 07/2019. He takes Ambien to help him sleep. He started on the [place] diet (06/2019): Diet, exercise, stress management.  He joined the program in [place], initially commuting there, but afterwards going only sporadically.  As of now, he is not going in person but tries to follow the diet at home.  He reads labels and tries to stay on a low-fat, low-salt, low sugar diet.  ROS:   Constitutional: + fatigue, + insomnia (periodic limb movement) + see HPI  I reviewed pt's medications, allergies, PMH, social hx, family hx, and changes were documented in the history of present illness. Otherwise, unchanged from my initial visit note.  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 Synergy DES 2.75 x 38 -- 3.0 mm).  mCx ~60% - med Rx, mRCA 30% & 40%.  ER 55-60%.   Cellulitis    Chronic  traumatic encephalopathy    right frontal-temproal lobe   Complication of anesthesia    " BRAIN FOG "   Depression    Erectile dysfunction 11/2016   follows with urology; sildenafil prescribed   Hx of adenomatous colonic polyps 07/2014   2 diminutive adenomas   Meningoencephalitis 1994   [place]   Pneumonia    Primary hypogonadism in male    Raynaud's disease 83/66/2947   As complication of RMSF   Restless legs 09/13/2015   [facility] spotted fever 2017   TBI (traumatic brain injury) ([place]) 1997, 2009   "concussions"   Vertebral artery stenosis, asymptomatic, right    Likely atretic; large dominant left vertebral artery noted.   Past Surgical History:  Procedure Laterality Date   COLONOSCOPY  07/2014   2 diminutive adenomas   CORONARY STENT INTERVENTION N/A 06/30/2018   Procedure: CORONARY STENT INTERVENTION;  Surgeon: [name], MD;  Location: [facility];;  p-mLAD2 80% & mLAD 70% (tandem lesions) - DES PCI Synergy DES 2.75 x 38 (3.0 mm).   CT CTA CORONARY W/CA SCORE W/CM &/OR WO/CM  06/2018   Cardiac cath score read 1318.  Moderate coronary disease sent for FFR: mRCA 0.84 (non-significant), mLAD 0.64 (signifiant), pLOM(Cx) - 0,72 (significant - but distal lesion)   LEFT HEART CATH AND CORONARY ANGIOGRAPHY N/A 06/30/2018   Procedure: LEFT HEART CATH AND CORONARY ANGIOGRAPHY;  Surgeon: [name], MD;  Location: [facility];;  pLAD1 55% (not significant), p-mLAD2 80% & mLAD 70% (tandem lesions -> DES PCI). mCx ~60% (med Rx), mRCA 30% & 40%.  ER 55-60%.    TEE WITHOUT CARDIOVERSION N/A 09/06/2015   Procedure: TRANSESOPHAGEAL ECHOCARDIOGRAM (TEE);  Surgeon: Jerline Pain, MD;  R/O endocarditis;   TONSILLECTOMY  1965   Social History   Social History   Marital status: Single    Spouse name: N/A   Number of children: 2   Occupational History   n/a   Social History Main Topics   Smoking status: Former Smoker    Packs/day: 1.50    Years: 25.00     Types: Cigarettes    Quit date: 2002   Smokeless tobacco: Never Used     Comment: Encouraged to remain smoke free   Alcohol use No     Comment: 8 drinks   Drug use: No   Social History Narrative   Divorced. Education: [employer].   Current Outpatient Medications on File Prior to Visit  Medication Sig Dispense Refill   Alpha-Lipoic Acid 300 MG CAPS Take by mouth.     amLODipine (NORVASC) 5 MG tablet Take 1 tablet (5 mg total) by mouth 2 (two) times daily. (Patient taking differently: Take 5 mg by mouth daily.) 90 tablet 3   amphetamine-dextroamphetamine (ADDERALL) 30 MG tablet Take 1-2 tablets by mouth daily. 60 tablet 0   b complex vitamins capsule Take 1 tablet by mouth daily.     buPROPion (WELLBUTRIN) 75 MG tablet TAKE 1 TABLET EVERY MORNING AND 1 TABLET AT NOON 180 tablet 3   Cholecalciferol 125 MCG (5000  UT) TABS      clopidogrel (PLAVIX) 75 MG tablet TAKE 1 TABLET BY MOUTH EVERY DAY WITH BREAKFAST 90 tablet 1   Coenzyme Q10 100 MG capsule Take by mouth.     gabapentin (NEURONTIN) 100 MG capsule Take by mouth.     Iron-Vitamin C (VITRON-C) 65-125 MG TABS Take by mouth.     magnesium oxide (MAG-OX) 400 MG tablet Take by mouth.     Melatonin 10 MG CAPS Take by mouth.     Menaquinone-7 (VITAMIN K2) 100 MCG CAPS      Misc Natural Products (PROSTATE SUPPORT PO) Take 1 capsule by mouth every evening. Prostate Plus     nitroGLYCERIN (NITROSTAT) 0.4 MG SL tablet PLACE 1 TABLET UNDER TONGUE EVERY 5 MINS AS NEEDED FOR CHEST PAIN 75 tablet 2   Omega-3 Fatty Acids (SUPER OMEGA 3 EPA/DHA PO) Take 1 capsule by mouth daily.     prednisoLONE acetate (PRED FORTE) 1 % ophthalmic suspension Place 1 drop into both eyes 4 (four) times daily as needed. Stop after 3-5 days. 5 mL 0   Probiotic Product ([facility] PROBIOTIC) CAPS      REPATHA SURECLICK 025 MG/ML SOAJ INJECT 140 MG INTO THE SKIN EVERY 14 (FOURTEEN) DAYS. 2 mL 11   testosterone (ANDROGEL) 50 MG/5GM (1%) GEL PLACE 5 GRAMS ONTO THE SKIN DAILY. 450  g 3   Theanine 50 MG TBDP Take by mouth.     vitamin B-12 (CYANOCOBALAMIN) 500 MCG tablet Take by mouth.     vitamin E 180 MG (400 UNITS) capsule Take by mouth.     zinc gluconate 50 MG tablet Take by mouth.     No current facility-administered medications on file prior to visit.   Allergies  Allergen Reactions   Penicillins Hives    Did it involve swelling of the face/tongue/throat, SOB, or low BP? Unknown Did it involve sudden or severe rash/hives, skin peeling, or any reaction on the inside of your mouth or nose? Yes Did you need to seek medical attention at a hospital or doctor's office? Unknown When did it last happen? Occurred at age 23 or 89 If all above answers are "NO", may proceed with cephalosporin use.    Statins Other (See Comments)    confusion   Methylprednisolone Other (See Comments)    Unsure of exact reaction type   Penicillin G Other (See Comments)   Amphetamines Other (See Comments)    Intolerant to specific formulations: [employer] amphetamine TEVA dextroamp amphetamine   Lorazepam Other (See Comments)    Mylan lorazepam; others ok   Propofol Other (See Comments)    Cognitive delay and emotional   Family History  Problem Relation Age of Onset   Hyperlipidemia Father    Heart disease Maternal Grandfather 50   Cancer Paternal Grandfather    Mental illness Mother    Mental illness Brother    PE: There were no vitals taken for this visit. Wt Readings from Last 3 Encounters:  11/23/20 183 lb 9.6 oz (83.3 kg)  11/14/20 182 lb 6.4 oz (82.7 kg)  07/11/20 185 lb (83.9 kg)   Constitutional: normal weight, in NAD Eyes: PERRLA, EOMI, no exophthalmos ENT: moist mucous membranes, no thyromegaly, no cervical lymphadenopathy Cardiovascular: RRR, No MRG Respiratory: CTA B Gastrointestinal: abdomen soft, NT, ND, BS+ Musculoskeletal: no deformities, strength intact in all 4 Skin: moist, warm, no rashes Neurological: no tremor with outstretched hands, DTR normal  in all 4  ASSESSMENT: 1.  Hypogonadotropic hypogonadism - possible  reasons for his very low initial testosterone: 1. Labs were drawn soon after diagnosis of RMSF, which, for him, was a fulminant, systemic, disease. Usually, the pituitary gland will shunt the production of testosterone or other hormones deemed "unnecessary" during acute illness. Testosterone levels could be quite low in this situation. 2. Labs were drawn also after a prednisone course, but I do not have a clear time sequence 3. He has a history of head trauma x2, which could have caused pituitary insufficiency 4. He has a history of alcohol use, however, he relates that he stopped in 2015  -Reviewed visit note from Dr. 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  He was given refills of generic sildenafil. -Was advised to continue 0.3 cc of testosterone cypionate weekly (200 mg / 1 mL) -On genital exam, he had mildly atrophic right and left testicles and a prostate of approximately 30 g,  without abnormalities  2. ED  PLAN:  1. Hypogonadism -Patient with history of hypogonadotropic hypogonadism, diagnosed approximately 6 years ago during an episode of RMSF, during which his testosterone was checked and it was in the castrate range.  He was started on testosterone afterwards and referred to me for further investigation.  At that time, pituitary hormones and pituitary MRI were normal.  LH and FSH were low, as expected on testosterone treatment.  He is hypothalamic-pituitary-testicular axis improved afterwards but did not recover completely so he is on testosterone supplement.  He was previously on 0.3 mL of testosterone weekly and we tried to decrease the dose to 0.2 mL weekly, but he could not tolerate it due to increased fatigue.  Also, he was describing fatigue and did not like the peaks and troughs given by the testosterone injection so we discussed about  possibly switching to testosterone gel, which would give him less variability.  He agrees to try this.  We started 5 g of testosterone daily.  Testosterone level in 06/2020 was normal. -I saw the patient 4 months ago and she was feeling well on the above regimen, without complaints including no decreased libido or problems with erections.  He did not feel the peaks and troughs of the testosterone levels anymore. -However, since last visit, he saw Dr. Diona Fanti earlier this month and had labs drawn.  A free testosterone level was actually low, at 29 (calculated value was 28) (47-244), and a total testosterone was also low at 207.2 (300-890).  He is scheduled this appointment emergently for further adjustment of testosterone therapy.  He does mention today that he felt that he was feeling more poorly only after he saw the testosterone levels -fatigue, hot flashes.  He still mentions good libido and no problems with erections. -Above note, he has a history of coronary artery disease.  He had an ED visit in 06/2019 for chest pain, but MI was ruled out.  He was started on PCSK9 inhibitor.  Of note, he also had a coronary stent placed 06/2018.  He was initially off testosterone afterwards, but we restarted testosterone in 08/2018 per Dr. Allison Quarry consent.  No recent chest pain or shortness of breath. -He had a slightly elevated estrogen in the past most likely due to testosterone treatment. -Most recent CBC was normal: Lab Results  Component Value Date   HGB 14.4 11/14/2020    Lab Results  Component Value Date   PSA 0.51 02/10/2020   PSA 0.51 10/25/2018   PSA 0.80 12/22/2017  -He  recently had a PSA at the urologist office and this was 0.69, slightly higher than before but still low. -At this visit, we discussed about rechecking the testosterone level, since we need to make sure that the level is still abnormal.  He will return for this between 8 and 9 in the morning, fasting.  If it is still low, we we  will increase his testosterone dose from 5 to 10 g daily. -We will repeat a testosterone level in 1.5 months -I will see him back in 8 months but sooner for labs   2. ED -With vascular origin, per notes reviewed from Dr. Diona Fanti -Improved -He is on PDE 5 inhibitor per urology.  He tolerates this well.  Orders Placed This Encounter  Procedures   Testosterone Free with SHBG   Component     Latest Ref Rng & Units 03/26/2021  Testosterone, Serum (Total)     ng/dL 1,163 (H)  % Free Testosterone     % 0.7  Free Testosterone, S     pg/mL 81  Sex Hormone Binding Globulin     nmol/L 98.1 (H)  Normal free testosterone now.   [name], MD PhD [facility]

## 2021-03-22 NOTE — Patient Instructions (Addendum)
Please come back for labs fasting, in am, between 8-9.  Otherwise, return for another visit as already scheduled.

## 2021-03-26 ENCOUNTER — Other Ambulatory Visit: Payer: Self-pay

## 2021-03-26 ENCOUNTER — Other Ambulatory Visit (INDEPENDENT_AMBULATORY_CARE_PROVIDER_SITE_OTHER): Payer: BC Managed Care – PPO

## 2021-03-26 DIAGNOSIS — E230 Hypopituitarism: Secondary | ICD-10-CM

## 2021-03-26 DIAGNOSIS — N529 Male erectile dysfunction, unspecified: Secondary | ICD-10-CM | POA: Diagnosis not present

## 2021-04-02 ENCOUNTER — Ambulatory Visit: Payer: BC Managed Care – PPO | Admitting: Adult Health

## 2021-04-03 ENCOUNTER — Encounter: Payer: Self-pay | Admitting: Internal Medicine

## 2021-04-03 DIAGNOSIS — G4733 Obstructive sleep apnea (adult) (pediatric): Secondary | ICD-10-CM | POA: Diagnosis not present

## 2021-04-03 LAB — TESTOSTERONE, FREE AND TOTAL (INCLUDES SHBG)-(MALES)
% Free Testosterone: 0.7 %
Free Testosterone, S: 81 pg/mL
Sex Hormone Binding Globulin: 98.1 nmol/L — ABNORMAL HIGH
Testosterone, Serum (Total): 1163 ng/dL — ABNORMAL HIGH

## 2021-04-15 DIAGNOSIS — M9902 Segmental and somatic dysfunction of thoracic region: Secondary | ICD-10-CM | POA: Diagnosis not present

## 2021-04-15 DIAGNOSIS — M9901 Segmental and somatic dysfunction of cervical region: Secondary | ICD-10-CM | POA: Diagnosis not present

## 2021-04-15 DIAGNOSIS — M9903 Segmental and somatic dysfunction of lumbar region: Secondary | ICD-10-CM | POA: Diagnosis not present

## 2021-04-15 DIAGNOSIS — M9905 Segmental and somatic dysfunction of pelvic region: Secondary | ICD-10-CM | POA: Diagnosis not present

## 2021-04-17 ENCOUNTER — Encounter: Payer: Self-pay | Admitting: Internal Medicine

## 2021-04-18 ENCOUNTER — Ambulatory Visit: Payer: BC Managed Care – PPO | Admitting: Internal Medicine

## 2021-04-24 ENCOUNTER — Telehealth: Payer: Self-pay | Admitting: Cardiology

## 2021-04-24 MED ORDER — AMLODIPINE BESYLATE 5 MG PO TABS: 5.0000 mg | ORAL_TABLET | Freq: Two times a day (BID) | ORAL | 3 refills | Status: DC

## 2021-04-24 MED ORDER — CLOPIDOGREL BISULFATE 75 MG PO TABS: ORAL_TABLET | ORAL | 1 refills | Status: DC

## 2021-04-24 NOTE — Telephone Encounter (Signed)
°*  STAT* If patient is at the pharmacy, call can be transferred to refill team.   1. Which medications need to be refilled? (please list name of each medication and dose if known)  amLODipine (NORVASC) 5 MG tablet clopidogrel (PLAVIX) 75 MG tablet  2. Which pharmacy/location (including street and city if local pharmacy) is medication to be sent to? [id number] - [place], [place] - [address]  3. Do they need a 30 day or 90 day supply? 90 with refills

## 2021-05-04 DIAGNOSIS — G4733 Obstructive sleep apnea (adult) (pediatric): Secondary | ICD-10-CM | POA: Diagnosis not present

## 2021-05-10 ENCOUNTER — Other Ambulatory Visit: Payer: Self-pay | Admitting: Cardiology

## 2021-05-14 ENCOUNTER — Encounter: Payer: Self-pay | Admitting: Cardiology

## 2021-05-14 ENCOUNTER — Ambulatory Visit: Payer: BC Managed Care – PPO | Admitting: Cardiology

## 2021-05-14 ENCOUNTER — Other Ambulatory Visit: Payer: Self-pay

## 2021-05-14 VITALS — BP 120/76 | HR 81 | Ht 73.0 in | Wt 181.6 lb

## 2021-05-14 DIAGNOSIS — E785 Hyperlipidemia, unspecified: Secondary | ICD-10-CM

## 2021-05-14 DIAGNOSIS — Z9861 Coronary angioplasty status: Secondary | ICD-10-CM | POA: Diagnosis not present

## 2021-05-14 DIAGNOSIS — T466X5D Adverse effect of antihyperlipidemic and antiarteriosclerotic drugs, subsequent encounter: Secondary | ICD-10-CM

## 2021-05-14 DIAGNOSIS — I10 Essential (primary) hypertension: Secondary | ICD-10-CM

## 2021-05-14 DIAGNOSIS — I2510 Atherosclerotic heart disease of native coronary artery without angina pectoris: Secondary | ICD-10-CM

## 2021-05-14 DIAGNOSIS — G720 Drug-induced myopathy: Secondary | ICD-10-CM | POA: Diagnosis not present

## 2021-05-14 DIAGNOSIS — T466X5A Adverse effect of antihyperlipidemic and antiarteriosclerotic drugs, initial encounter: Secondary | ICD-10-CM

## 2021-05-14 DIAGNOSIS — I7300 Raynaud's syndrome without gangrene: Secondary | ICD-10-CM

## 2021-05-14 MED ORDER — AMLODIPINE BESYLATE 2.5 MG PO TABS: 2.5000 mg | ORAL_TABLET | Freq: Every day | ORAL | 3 refills | Status: DC

## 2021-05-14 NOTE — Patient Instructions (Signed)
Medication Instructions:  Stop taking plavix /clopidogrel   Start taking Aspirin 81 mg ( enteric coated) daily   Decrease  Amlodipine to 2.5 mg  tablet   monitor blood pressure if ( top number)  avg above  135  increase back to 5 mg daily    *If you need a refill on your cardiac medications before your next appointment, please call your pharmacy*   Lab Work:  If you have labs (blood work) drawn today and your tests are completely normal, you will receive your results only by: [place] (if you have MyChart) OR A paper copy in the mail If you have any lab test that is abnormal or we need to change your treatment, we will call you to review the results.   Testing/Procedures:    Follow-Up: At [facility], you and your health needs are our priority.  As part of our continuing mission to provide you with exceptional heart care, we have created designated Provider Care Teams.  These Care Teams include your primary Cardiologist (physician) and Advanced Practice Providers (APPs -  Physician Assistants and Nurse Practitioners) who all work together to provide you with the care you need, when you need it.     Your next appointment:   6 month(s)  The format for your next appointment:   In Person  Provider:   [name], MD

## 2021-05-14 NOTE — Progress Notes (Signed)
Primary Care Provider: [name], MD Cardiologist: [name], MD Electrophysiologist: None  Clinic Note: No chief complaint on file.  ===================================  ASSESSMENT/PLAN   Problem List Items Addressed This Visit       Cardiology Problems   CAD S/P percutaneous coronary angioplasty (Chronic)    Far enough out from her single-vessel PCI to convert from Thienopyridine to aspirin. Plan: DC Plavix upon completion of current bottle and start aspirin 81 mg daily.       Relevant Medications   amLODipine (NORVASC) 2.5 MG tablet   Other Relevant Orders   EKG 12-Lead (Completed)   Lipid panel   Comprehensive metabolic panel   Coronary artery disease involving native coronary artery of native heart without angina pectoris - Primary (Chronic)    Atypical sounding chest discomfort evaluated last year with a Myoview stress test that was nonischemic.  Doing well since PCI.  Pretty much not tolerant of most medications.  We stop beta-blocker because of fatigue.  Stopped ARB because of wheezing.  He does not like being on high doses of calcium channel blocker and is reduced to once a day from twice a day amlodipine 5 mg.  He has had issues with hypotension in the past.  Plan: Reduce to 2.5 mg daily amlodipine Discontinue Plavix and start aspirin 81 mg daily With statin intolerance, he is on Repatha.  Lipids well controlled. Continue active lifestyle as symptom monitor.      Relevant Medications   amLODipine (NORVASC) 2.5 MG tablet   Other Relevant Orders   EKG 12-Lead (Completed)   Lipid panel   Comprehensive metabolic panel   Hyperlipidemia with target LDL less than 70 (Chronic)    Outstanding response to Repatha.  Lipids have been looking great. .    Lipids ordered for follow-up now      Relevant Medications   amLODipine (NORVASC) 2.5 MG tablet   Other Relevant Orders   EKG 12-Lead (Completed)   Lipid panel   Comprehensive metabolic panel    Raynaud's disease (Chronic)    Not currently having any significant symptoms.  He is on amlodipine.  Try to reduce to 2.5 mg and see how he does.      Relevant Medications   amLODipine (NORVASC) 2.5 MG tablet   Essential hypertension (Chronic)    Blood pressure looks good today, but he is noting a little edema on amlodipine.  He says it usually is better at home.  He is very leery of taking medications.  He has been taking 5 mg twice daily which is reduced to 5 mg daily.  We will reduce to 2.5 mg and have them assess his pressures.  If it goes up, he will go back to 5 mg.      Relevant Medications   amLODipine (NORVASC) 2.5 MG tablet   Other Relevant Orders   EKG 12-Lead (Completed)   Lipid panel   Comprehensive metabolic panel     Other   Statin myopathy (Chronic)    He is tried several different types of statins, had significant myopathy.  Now on Repatha.  Doing very well.       ===================================  HPI:    Phillip [name] is a 66 y.o. male with a PMH notable for CAD-PCI, HTN, HLD who presents today for annual follow-up.  CAD -February 2020: referred for-Coronary Calcium Score abnormal-Coronary CTA with LAD disease  => cath with LAD stenosis-PCI   Phillip [name] was last seen on June 06, 2020 ->  he was having occasional exertional chest tightness and had an upcoming surgery.  We had also started using amlodipine for Raynaud's type symptoms.  He had been started on Repatha, doing well. Myoview ordered to assess for ischemia as part of preop evaluation. No beta-blocker because of fatigue, no ARB because of wheezing.  Recent Hospitalizations: None  Reviewed  CV studies:    The following studies were reviewed today: (if available, images/films reviewed: From Epic Chart or Care Everywhere) Myoview 07/11/2020: Exercised ~14 min.  15.7 METS.  EF 50 to 55%.  No ischemia infarction.  He did report some chest tightness.   Interval History:   Phillip [name]  returns for routine follow-up.  As usual he is companied by his friend who helps him with some of his memory issues.  He is doing really well.  Very active, exercising several days a week.  He has been under little stress recently his mother passed away after being put on comfort care for a while.  He had a lot of stress associated with that, but now that he she has passed, he does feel a sense of relief.  He is denying any chest pain or pressure with rest or exertion.  Has a little bit of swelling at the end of the day, but no PND, or orthopnea.  No prolonged irregular heartbeats palpitations.  Just rare short-lived fluttering.  CV Review of Symptoms (Summary) Cardiovascular ROS: no chest pain or dyspnea on exertion positive for - -rare, fleeting episodes of palpitations, very infrequent sharp chest discomfort not associate with activity.  Mild end of day swelling negative for - irregular heartbeat, orthopnea, paroxysmal nocturnal dyspnea, rapid heart rate, shortness of breath, or lightheadedness, dizziness or wooziness, syncope/near syncope or TIA/amaurosis fugax, claudication  REVIEWED OF SYSTEMS   Review of Systems  Constitutional:  Negative for malaise/fatigue and weight loss.  HENT:  Negative for nosebleeds.   Respiratory:  Positive for cough (Off and on in the morning.  Usually during allergy season, but sometimes in the wintertime). Negative for shortness of breath.   Cardiovascular:        Per HPI  Gastrointestinal:  Negative for blood in stool and melena.  Genitourinary:  Negative for hematuria.  Musculoskeletal:  Negative for joint pain and myalgias.  Neurological:  Positive for headaches. Negative for dizziness, focal weakness, seizures and loss of consciousness.  Psychiatric/Behavioral:  Positive for memory loss (Stable). Negative for depression (In good spirits). The patient is not nervous/anxious and does not have insomnia.    I have reviewed and (if needed) personally updated  the patient's problem list, medications, allergies, past medical and surgical history, social and family history.   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 Synergy DES 2.75 x 38 -- 3.0 mm).  mCx ~60% - med Rx, mRCA 30% & 40%.  ER 55-60%.   Cellulitis    Chronic traumatic encephalopathy    right frontal-temproal lobe   Complication of anesthesia    " BRAIN FOG "   Depression    Erectile dysfunction  11/2016   follows with urology; sildenafil prescribed   Hx of adenomatous colonic polyps 07/2014   2 diminutive adenomas   Meningoencephalitis 1994   [facility]   Pneumonia    Primary hypogonadism in male    Raynaud's disease 44/31/5400   As complication of RMSF   Restless legs 09/13/2015   [facility] spotted fever 2017   TBI (traumatic brain injury) 1997, 2009   "concussions"   Vertebral artery stenosis, asymptomatic, right    Likely atretic; large dominant left vertebral artery noted.    PAST SURGICAL HISTORY   Past Surgical History:  Procedure Laterality Date   COLONOSCOPY  07/2014   2 diminutive adenomas   CORONARY STENT INTERVENTION N/A 06/30/2018   Procedure: CORONARY STENT INTERVENTION;  Surgeon: [name], MD;  Location: [facility];;  p-mLAD2 80% & mLAD 70% (tandem lesions) - DES PCI Synergy DES 2.75 x 38 (3.0 mm).   CT CTA CORONARY W/CA SCORE W/CM &/OR WO/CM  06/2018   Cardiac cath score read 1318.  Moderate coronary disease sent for FFR: mRCA 0.84 (non-significant), mLAD 0.64 (signifiant), pLOM(Cx) - 0,72 (significant - but distal lesion)   LEFT HEART CATH AND  CORONARY ANGIOGRAPHY N/A 06/30/2018   Procedure: LEFT HEART CATH AND CORONARY ANGIOGRAPHY;  Surgeon: [name], MD;  Location: [facility] INVASIVE CV LAB;;  pLAD1 55% (not significant), p-mLAD2 80% & mLAD 70% (tandem lesions -> DES PCI). mCx ~60% (med Rx), mRCA 30% & 40%.  ER 55-60%.    TEE WITHOUT CARDIOVERSION N/A 09/06/2015   Procedure: TRANSESOPHAGEAL ECHOCARDIOGRAM (TEE);  Surgeon: Jerline Pain, MD;  R/O endocarditis;   TONSILLECTOMY  1965    Immunization History  Administered Date(s) Administered   PFIZER(Purple Top)SARS-COV-2 Vaccination 07/17/2019, 08/08/2019   Tdap 05/05/2010    MEDICATIONS/ALLERGIES   Current Meds  Medication Sig   Alpha-Lipoic Acid 300 MG CAPS Take by mouth.   amLODipine (NORVASC) 2.5 MG tablet Take 1 tablet (2.5 mg total) by mouth daily.   amphetamine-dextroamphetamine (ADDERALL) 30 MG tablet Take 1-2 tablets by mouth daily.   b complex vitamins capsule Take 1 tablet by mouth daily.   Cholecalciferol 125 MCG (5000 UT) TABS    Coenzyme Q10 100 MG capsule Take by mouth.   gabapentin (NEURONTIN) 100 MG capsule Take by mouth.   Iron-Vitamin C (VITRON-C) 65-125 MG TABS Take by mouth.   magnesium oxide (MAG-OX) 400 MG tablet Take by mouth.   Menaquinone-7 (VITAMIN K2) 100 MCG CAPS    Misc Natural Products (PROSTATE SUPPORT PO) Take 1 capsule by mouth every evening. Prostate Plus   nitroGLYCERIN (NITROSTAT) 0.4 MG SL tablet PLACE 1 TABLET UNDER TONGUE EVERY 5 MINS AS NEEDED FOR CHEST PAIN   Omega-3 Fatty Acids (SUPER OMEGA 3 EPA/DHA PO) Take 1 capsule by mouth daily.   prednisoLONE acetate (PRED FORTE) 1 % ophthalmic suspension Place 1 drop into both eyes 4 (four) times daily as needed. Stop after 3-5 days.   REPATHA SURECLICK 867 MG/ML SOAJ INJECT 140 MG INTO THE SKIN EVERY 14 (FOURTEEN) DAYS.   testosterone (ANDROGEL) 50 MG/5GM (1%) GEL PLACE 5 GRAMS ONTO THE SKIN DAILY.   Theanine 50 MG TBDP Take by mouth.   vitamin B-12 (CYANOCOBALAMIN) 500 MCG tablet Take  by mouth.   vitamin E 180 MG (400 UNITS) capsule Take by mouth.   zinc gluconate 50 MG tablet Take by mouth.   [DISCONTINUED] amLODipine (NORVASC) 5 MG tablet Take 1 tablet (5 mg total) by mouth 2 (two)  times daily. (Patient taking differently: Take 5 mg by mouth daily.)   [DISCONTINUED] amLODipine (NORVASC) 5 MG tablet Take 5 mg by mouth daily.   [DISCONTINUED] clopidogrel (PLAVIX) 75 MG tablet TAKE 1 TABLET BY MOUTH EVERY DAY WITH BREAKFAST    Allergies  Allergen Reactions   Penicillins Hives    Did it involve swelling of the face/tongue/throat, SOB, or low BP? Unknown Did it involve sudden or severe rash/hives, skin peeling, or any reaction on the inside of your mouth or nose? Yes Did you need to seek medical attention at a hospital or doctor's office? Unknown When did it last happen? Occurred at age 21 or 61 If all above answers are NO, may proceed with cephalosporin use.    Statins Other (See Comments)    confusion   Methylprednisolone Other (See Comments)    Unsure of exact reaction type   Penicillin G Other (See Comments)   Amphetamines Other (See Comments)    Intolerant to specific formulations: [employer] amphetamine TEVA dextroamp amphetamine   Lorazepam Other (See Comments)    Mylan lorazepam; others ok   Propofol Other (See Comments)    Cognitive delay and emotional    SOCIAL HISTORY/FAMILY HISTORY   Reviewed in Epic:  Pertinent findings:  Social History   Tobacco Use   Smoking status: Former    Packs/day: 1.50    Years: 25.00    Pack years: 37.50    Types: Cigarettes    Quit date: 11/22/2014    Years since quitting: 6.4   Smokeless tobacco: Never   Tobacco comments:    Encouraged to remain smoke free  Vaping Use   Vaping Use: Never used  Substance Use Topics   Alcohol use: No    Alcohol/week: 0.0 standard drinks   Drug use: No   Social History   Social History Narrative   Divorced. B.A. degree. Retired.   Drinks caffeine, uses herbal remedies,  takes a daily vitamin.   Wears his seatbelt.  Smoke detector in the home.   Firearms locked in the home.   Feels safe in relationships.       He now has switched to a vegan diet.  Notably increase exercise level.    OBJCTIVE -PE, EKG, labs   Wt Readings from Last 3 Encounters:  05/14/21 181 lb 9.6 oz (82.4 kg)  03/22/21 182 lb 9.6 oz (82.8 kg)  11/23/20 183 lb 9.6 oz (83.3 kg)    Physical Exam: BP 120/76    Pulse 81    Ht 6\' 1"  (1.854 m)    Wt 181 lb 9.6 oz (82.4 kg)    SpO2 98%    BMI 23.96 kg/m  Physical Exam Vitals reviewed.  Constitutional:      Appearance: Normal appearance.  HENT:     Head: Normocephalic and atraumatic.  Neck:     Vascular: No carotid bruit or JVD.  Cardiovascular:     Rate and Rhythm: Normal rate and regular rhythm. No extrasystoles are present.    Chest Wall: PMI is not displaced.     Pulses: Normal pulses.     Heart sounds: S1 normal and S2 normal. No murmur heard.   No friction rub. No gallop.  Pulmonary:     Effort: Pulmonary effort is normal. No respiratory distress.     Breath sounds: Normal breath sounds. No wheezing, rhonchi or rales.  Chest:     Chest wall: No tenderness.  Musculoskeletal:        General: No swelling.  Normal range of motion.     Cervical back: Normal range of motion and neck supple.  Skin:    General: Skin is warm and dry.  Neurological:     General: No focal deficit present.     Mental Status: He is alert and oriented to person, place, and time.     Motor: No weakness.  Psychiatric:        Mood and Affect: Mood normal.        Behavior: Behavior normal.        Thought Content: Thought content normal.        Judgment: Judgment normal.     Adult ECG Report  Rate: 81 ;  Rhythm: normal sinus rhythm, sinus arrhythmia, and incomplete right bundle branch block, nonspecific ST and T wave changes. ; Otherwise normal axis, intervals, and durations.  Narrative Interpretation: Stable  Recent Labs: Reviewed.   Excellent 03/23/2020: TC 134, TG 72, HDL 42 LDL 52. Cr 0.85.  K+ 5.2.  CBC Latest Ref Rng & Units 11/14/2020 05/17/2020 07/08/2019  WBC 3.8 - 10.8 Thousand/uL 6.9 6.0 3.9  Hemoglobin 13.2 - 17.1 g/dL 14.4 14.7 14.3  Hematocrit 38.5 - 50.0 % 43.4 44.7 43.5  Platelets 140 - 400 Thousand/uL 301 287 269    Lab Results  Component Value Date   HGBA1C 5.0 07/08/2019   Lab Results  Component Value Date   TSH 2.67 12/15/2017    ==================================================  COVID-19 Education: The signs and symptoms of COVID-19 were discussed with the patient and how to seek care for testing (follow up with PCP or arrange E-visit).    I spent a total of 22 minutes with the patient spent in direct patient consultation.  Additional time spent with chart review  / charting (studies, outside notes, etc): 12 min Total Time: 34 min  Current medicines are reviewed at length with the patient today.  (+/- concerns) he asks about reducing amlodipine dose because of mild swelling.  Also bruising on Plavix.  This visit occurred during the SARS-CoV-2 public health emergency.  Safety protocols were in place, including screening questions prior to the visit, additional usage of staff PPE, and extensive cleaning of exam room while observing appropriate contact time as indicated for disinfecting solutions.  Notice: This dictation was prepared with Dragon dictation along with smart phrase technology. Any transcriptional errors that result from this process are unintentional and may not be corrected upon review.  Studies Ordered:   Orders Placed This Encounter  Procedures   Lipid panel   Comprehensive metabolic panel   EKG 09-NATF    Patient Instructions / Medication Changes & Studies & Tests Ordered   Patient Instructions  Medication Instructions:  Stop taking plavix /clopidogrel   Start taking Aspirin 81 mg ( enteric coated) daily   Decrease  Amlodipine to 2.5 mg  tablet   monitor blood  pressure if ( top number)  avg above  135  increase back to 5 mg daily    *If you need a refill on your cardiac medications before your next appointment, please call your pharmacy*   Lab Work:  If you have labs (blood work) drawn today and your tests are completely normal, you will receive your results only by: MyChart Message (if you have MyChart) OR A paper copy in the mail If you have any lab test that is abnormal or we need to change your treatment, we will call you to review the results.   Testing/Procedures:    Follow-Up: At  [facility], you and your health needs are our priority.  As part of our continuing mission to provide you with exceptional heart care, we have created designated Provider Care Teams.  These Care Teams include your primary Cardiologist (physician) and Advanced Practice Providers (APPs -  Physician Assistants and Nurse Practitioners) who all work together to provide you with the care you need, when you need it.     Your next appointment:   6 month(s)  The format for your next appointment:   In Person  Provider:   [name], MD      Signed.     [name], M.D., M.S. Interventional Cardiologist   Pager # [phone] Phone # [phone] [address] [place], [place] [address]   Thank you for choosing Heartcare at [facility]!!

## 2021-05-21 NOTE — Assessment & Plan Note (Signed)
He is tried several different types of statins, had significant myopathy.  Now on Repatha.  Doing very well.

## 2021-05-21 NOTE — Assessment & Plan Note (Signed)
Atypical sounding chest discomfort evaluated last year with a Myoview stress test that was nonischemic.  Doing well since PCI.  Pretty much not tolerant of most medications.  We stop beta-blocker because of fatigue.  Stopped ARB because of wheezing.  He does not like being on high doses of calcium channel blocker and is reduced to once a day from twice a day amlodipine 5 mg.  He has had issues with hypotension in the past.  Plan:  Reduce to 2.5 mg daily amlodipine  Discontinue Plavix and start aspirin 81 mg daily  With statin intolerance, he is on Repatha.  Lipids well controlled.  Continue active lifestyle as symptom monitor.

## 2021-05-21 NOTE — Assessment & Plan Note (Signed)
Blood pressure looks good today, but he is noting a little edema on amlodipine.  He says it usually is better at home.  He is very leery of taking medications.  He has been taking 5 mg twice daily which is reduced to 5 mg daily.  We will reduce to 2.5 mg and have them assess his pressures.  If it goes up, he will go back to 5 mg.

## 2021-05-21 NOTE — Assessment & Plan Note (Signed)
Not currently having any significant symptoms.  He is on amlodipine.  Try to reduce to 2.5 mg and see how he does.

## 2021-05-21 NOTE — Assessment & Plan Note (Signed)
Far enough out from her single-vessel PCI to convert from Thienopyridine to aspirin. Plan: DC Plavix upon completion of current bottle and start aspirin 81 mg daily.

## 2021-05-21 NOTE — Assessment & Plan Note (Signed)
Outstanding response to [place].  Lipids have been looking great. .    Lipids ordered for follow-up now

## 2021-05-27 ENCOUNTER — Encounter: Payer: Self-pay | Admitting: Internal Medicine

## 2021-05-29 ENCOUNTER — Encounter: Payer: Self-pay | Admitting: Internal Medicine

## 2021-06-06 DIAGNOSIS — I10 Essential (primary) hypertension: Secondary | ICD-10-CM | POA: Diagnosis not present

## 2021-06-06 DIAGNOSIS — Z9861 Coronary angioplasty status: Secondary | ICD-10-CM | POA: Diagnosis not present

## 2021-06-06 DIAGNOSIS — I2510 Atherosclerotic heart disease of native coronary artery without angina pectoris: Secondary | ICD-10-CM | POA: Diagnosis not present

## 2021-06-06 DIAGNOSIS — E785 Hyperlipidemia, unspecified: Secondary | ICD-10-CM | POA: Diagnosis not present

## 2021-06-06 LAB — COMPREHENSIVE METABOLIC PANEL
ALT: 19 IU/L (ref 0–44)
AST: 31 IU/L (ref 0–40)
Albumin/Globulin Ratio: 2.9 — ABNORMAL HIGH (ref 1.2–2.2)
Albumin: 4.9 g/dL — ABNORMAL HIGH (ref 3.8–4.8)
Alkaline Phosphatase: 94 IU/L (ref 44–121)
BUN/Creatinine Ratio: 22 (ref 10–24)
BUN: 20 mg/dL (ref 8–27)
Bilirubin Total: 0.6 mg/dL (ref 0.0–1.2)
CO2: 28 mmol/L (ref 20–29)
Calcium: 9.2 mg/dL (ref 8.6–10.2)
Chloride: 103 mmol/L (ref 96–106)
Creatinine, Ser: 0.89 mg/dL (ref 0.76–1.27)
Globulin, Total: 1.7 g/dL (ref 1.5–4.5)
Glucose: 101 mg/dL — ABNORMAL HIGH (ref 70–99)
Potassium: 4.8 mmol/L (ref 3.5–5.2)
Sodium: 142 mmol/L (ref 134–144)
Total Protein: 6.6 g/dL (ref 6.0–8.5)
eGFR: 96 mL/min/{1.73_m2} (ref >59)

## 2021-06-06 LAB — LIPID PANEL
Chol/HDL Ratio: 1.6 ratio (ref 0.0–5.0)
Cholesterol, Total: 144 mg/dL (ref 100–199)
HDL: 89 mg/dL (ref >39)
LDL Chol Calc (NIH): 46 mg/dL (ref 0–99)
Triglycerides: 36 mg/dL (ref 0–149)
VLDL Cholesterol Cal: 9 mg/dL (ref 5–40)

## 2021-06-07 DIAGNOSIS — F4323 Adjustment disorder with mixed anxiety and depressed mood: Secondary | ICD-10-CM | POA: Diagnosis not present

## 2021-06-19 ENCOUNTER — Other Ambulatory Visit: Payer: Self-pay | Admitting: Internal Medicine

## 2021-06-20 ENCOUNTER — Other Ambulatory Visit (HOSPITAL_COMMUNITY): Payer: Self-pay

## 2021-06-20 ENCOUNTER — Telehealth: Payer: Self-pay

## 2021-06-20 NOTE — Telephone Encounter (Signed)
I am not quite sure how much the patient ate for it in the 450 g form.  Would suggest to check with the patient.  If he cannot afford the 80$, will need a PA.

## 2021-06-20 NOTE — Telephone Encounter (Signed)
Patient Advocate Encounter   Received notification from [facility] that prior authorization for Testosterone 1% gel is required by his/her insurance [employer] [facility].   PA submitted on 06/20/21  Key#: [id number]  Status is pending    [facility] will continue to follow:  Patient Advocate Fax: [phone]

## 2021-06-24 ENCOUNTER — Other Ambulatory Visit (HOSPITAL_COMMUNITY): Payer: Self-pay

## 2021-06-24 NOTE — Telephone Encounter (Signed)
Patient Advocate Encounter  Pt insurance has approved the requested medication until 12.31.2039. This approval allows for up to 60 packets (5gm each) per 30 days. No additional review is required at this time.

## 2021-06-25 ENCOUNTER — Other Ambulatory Visit: Payer: Self-pay | Admitting: Internal Medicine

## 2021-06-25 MED ORDER — TESTOSTERONE 50 MG/5GM (1%) TD GEL: TRANSDERMAL | 3 refills | Status: DC

## 2021-06-25 NOTE — Telephone Encounter (Signed)
I sent it again  

## 2021-06-26 DIAGNOSIS — F4323 Adjustment disorder with mixed anxiety and depressed mood: Secondary | ICD-10-CM | POA: Diagnosis not present

## 2021-07-01 ENCOUNTER — Ambulatory Visit: Payer: BC Managed Care – PPO

## 2021-07-02 ENCOUNTER — Other Ambulatory Visit: Payer: Self-pay

## 2021-07-02 ENCOUNTER — Ambulatory Visit (AMBULATORY_SURGERY_CENTER): Payer: BC Managed Care – PPO | Admitting: *Deleted

## 2021-07-02 VITALS — Ht 73.0 in | Wt 184.0 lb

## 2021-07-02 DIAGNOSIS — Z86010 Personal history of colonic polyps: Secondary | ICD-10-CM

## 2021-07-02 NOTE — Progress Notes (Signed)
No egg or soy allergy known to patient  Pt has allergy to Propofol, Dr Carlean Purl said ok to use Versed  Patient denies ever being told they had issues or difficulty with intubation  No FH of Malignant Hyperthermia Pt is not on diet pills Pt is not on  home 02  Pt is not on blood thinners  Pt denies issues with constipation  No A fib or A flutter  Pt is fully vaccinated  for Covid    Due to the COVID-19 pandemic we are asking patients to follow certain guidelines in PV and the [facility]   Pt aware of COVID protocols and [facility] guidelines   PV completed over the phone. Pt verified name, DOB, address and insurance during PV today.   Pt encouraged to call with questions or issues.  If pt has My chart, procedure instructions sent via My Chart

## 2021-07-09 ENCOUNTER — Encounter: Payer: Self-pay | Admitting: Internal Medicine

## 2021-07-15 ENCOUNTER — Encounter: Payer: Self-pay | Admitting: Certified Registered Nurse Anesthetist

## 2021-07-16 ENCOUNTER — Other Ambulatory Visit: Payer: Self-pay

## 2021-07-16 ENCOUNTER — Encounter: Payer: Self-pay | Admitting: Internal Medicine

## 2021-07-16 ENCOUNTER — Ambulatory Visit (AMBULATORY_SURGERY_CENTER): Payer: BC Managed Care – PPO | Admitting: Internal Medicine

## 2021-07-16 VITALS — BP 126/78 | HR 54 | Temp 98.1°F | Resp 13 | Ht 73.0 in | Wt 184.0 lb

## 2021-07-16 DIAGNOSIS — Z86010 Personal history of colonic polyps: Secondary | ICD-10-CM | POA: Diagnosis not present

## 2021-07-16 DIAGNOSIS — D122 Benign neoplasm of ascending colon: Secondary | ICD-10-CM | POA: Diagnosis not present

## 2021-07-16 DIAGNOSIS — Z1211 Encounter for screening for malignant neoplasm of colon: Secondary | ICD-10-CM | POA: Diagnosis not present

## 2021-07-16 MED ORDER — SODIUM CHLORIDE 0.9 % IV SOLN: 500.0000 mL | Freq: Once | INTRAVENOUS | Status: DC

## 2021-07-16 NOTE — Op Note (Signed)
[facility] ?Patient Name: Phillip [name] ?Procedure Date: 07/16/2021 10:33 AM ?MRN: [id number] ?Endoscopist: [name] , MD ?Age: 66 ?Referring MD:  ?Date of Birth: [birth year] ?Gender: Male ?Account #: [id number] ?Procedure:                Colonoscopy ?Indications:              Surveillance: Personal history of adenomatous  ?                          polyps on last colonoscopy > 5 years ago, Last  ?                          colonoscopy: 2016 ?Medicines:                Propofol per Anesthesia, Monitored Anesthesia Care ?Procedure:                Pre-Anesthesia Assessment: ?                          - Prior to the procedure, a History and Physical  ?                          was performed, and patient medications and  ?                          allergies were reviewed. The patient's tolerance of  ?                          previous anesthesia was also reviewed. The risks  ?                          and benefits of the procedure and the sedation  ?                          options and risks were discussed with the patient.  ?                          All questions were answered, and informed consent  ?                          was obtained. Prior Anticoagulants: The patient has  ?                          taken no previous anticoagulant or antiplatelet  ?                          agents. ASA Grade Assessment: III - A patient with  ?                          severe systemic disease. After reviewing the risks  ?                          and benefits, the patient was deemed in  ?  satisfactory condition to undergo the procedure. ?                          After obtaining informed consent, the colonoscope  ?                          was passed under direct vision. Throughout the  ?                          procedure, the patient's blood pressure, pulse, and  ?                          oxygen saturations were monitored continuously. The  ?                          CF [id number] #[id number] was  introduced through the anus  ?                          and advanced to the the cecum, identified by  ?                          appendiceal orifice and ileocecal valve. The  ?                          colonoscopy was performed without difficulty. The  ?                          patient tolerated the procedure well. The quality  ?                          of the bowel preparation was good. The bowel  ?                          preparation used was Miralax via split dose  ?                          instruction. The ileocecal valve, appendiceal  ?                          orifice, and rectum were photographed. ?Scope In: 10:43:26 AM ?Scope Out: 11:05:18 AM ?Scope Withdrawal Time: 0 hours 16 minutes 25 seconds  ?Total Procedure Duration: 0 hours 21 minutes 52 seconds  ?Findings:                 The perianal and digital rectal examinations were  ?                          normal. Pertinent negatives include normal prostate  ?                          (size, shape, and consistency). ?                          A 1 to 2 mm polyp was found in the ascending colon.  ?  The polyp was sessile. The polyp was removed with a  ?                          cold biopsy forceps. Resection and retrieval were  ?                          complete. Verification of patient identification  ?                          for the specimen was done. Estimated blood loss was  ?                          minimal. ?                          Multiple diverticula were found in the sigmoid  ?                          colon. ?                          External and internal hemorrhoids were found. ?                          The exam was otherwise without abnormality on  ?                          direct and retroflexion views. ?Complications:            No immediate complications. ?Estimated Blood Loss:     Estimated blood loss was minimal. ?Impression:               - One 1 to 2 mm polyp in the ascending colon,  ?                           removed with a cold biopsy forceps. Resected and  ?                          retrieved. ?                          - Diverticulosis in the sigmoid colon. ?                          - External and internal hemorrhoids. ?                          - The examination was otherwise normal on direct  ?                          and retroflexion views. ?                          - Personal history of colonic polyps. 2 diminutive  ?  adenomas removed 2016 ?Recommendation:           - Patient has a contact number available for  ?                          emergencies. The signs and symptoms of potential  ?                          delayed complications were discussed with the  ?                          patient. Return to normal activities tomorrow.  ?                          Written discharge instructions were provided to the  ?                          patient. ?                          - Resume previous diet. ?                          - Continue present medications. ?                          - Await pathology results. ?                          - Repeat colonoscopy is recommended for  ?                          surveillance. The colonoscopy date will be  ?                          determined after pathology results from today's  ?                          exam become available for review. ?[name], MD ?07/16/2021 11:14:18 AM ?This report has been signed electronically. ?

## 2021-07-16 NOTE — Patient Instructions (Addendum)
I found and removed one 1-2 mm polyp. ?You also have a condition called diverticulosis - common and not usually a problem. Please read the handout provided. ?Hemorrhoids were slightly swollen - common after a prep. ? ?I will let you know pathology results and when to have another routine colonoscopy by mail and/or My Chart. ? ?I appreciate the opportunity to care for you. ?[name], MD, [name] ? ? ? ?YOU HAD AN ENDOSCOPIC PROCEDURE TODAY AT [facility]:   Refer to the procedure report that was given to you for any specific questions about what was found during the examination.  If the procedure report does not answer your questions, please call your gastroenterologist to clarify.  If you requested that your care partner not be given the details of your procedure findings, then the procedure report has been included in a sealed envelope for you to review at your convenience later. ? ?YOU SHOULD EXPECT: Some feelings of bloating in the abdomen. Passage of more gas than usual.  Walking can help get rid of the air that was put into your GI tract during the procedure and reduce the bloating. If you had a lower endoscopy (such as a colonoscopy or flexible sigmoidoscopy) you may notice spotting of blood in your stool or on the toilet paper. If you underwent a bowel prep for your procedure, you may not have a normal bowel movement for a few days. ? ?Please Note:  You might notice some irritation and congestion in your nose or some drainage.  This is from the oxygen used during your procedure.  There is no need for concern and it should clear up in a day or so. ? ?SYMPTOMS TO REPORT IMMEDIATELY: ? ?Following lower endoscopy (colonoscopy or flexible sigmoidoscopy): ? Excessive amounts of blood in the stool ? Significant tenderness or worsening of abdominal pains ? Swelling of the abdomen that is new, acute ? Fever of 100?F or higher ? ?For urgent or emergent issues, a gastroenterologist can be reached at  any hour by calling [phone]. ?Do not use MyChart messaging for urgent concerns.  ? ? ?DIET:  We do recommend a small meal at first, but then you may proceed to your regular diet.  Drink plenty of fluids but you should avoid alcoholic beverages for 24 hours. ? ?ACTIVITY:  You should plan to take it easy for the rest of today and you should NOT DRIVE or use heavy machinery until tomorrow (because of the sedation medicines used during the test).   ? ?FOLLOW UP: ?Our staff will call the number listed on your records 48-72 hours following your procedure to check on you and address any questions or concerns that you may have regarding the information given to you following your procedure. If we do not reach you, we will leave a message.  We will attempt to reach you two times.  During this call, we will ask if you have developed any symptoms of COVID 19. If you develop any symptoms (ie: fever, flu-like symptoms, shortness of breath, cough etc.) before then, please call [phone].  If you test positive for Covid 19 in the 2 weeks post procedure, please call and report this information to [place].   ? ?If any biopsies were taken you will be contacted by phone or by letter within the next 1-3 weeks.  Please call us at [phone] if you have not heard about the biopsies in 3 weeks.  ? ? ?SIGNATURES/CONFIDENTIALITY: ?You and/or your  care partner have signed paperwork which will be entered into your electronic medical record.  These signatures attest to the fact that that the information above on your After Visit Summary has been reviewed and is understood.  Full responsibility of the confidentiality of this discharge information lies with you and/or your care-partner.  ?

## 2021-07-16 NOTE — Progress Notes (Signed)
75 Spoke with patient and family member about allergy to propofol and lorazepam, gave other options. Education about half life of versed/fentanyl vs propofol and "brain fog".  Offered light sedation but pt requested to be completely asleep for colonoscopy.  Patient and family gave clearance to use propofol for case and see if emotional status and memory are caused by propofol. Luanna Cole. Marek Nghiem CRNA ?

## 2021-07-16 NOTE — Progress Notes (Signed)
[name] History and Physical ? ? ?Primary Care Physician:  [name], MD ? ? ?Reason for Procedure:   Hx colon polyps ? ?Plan:    colonoscopy ? ? ? ? ?HPI: Phillip [name] is a 66 y.o. male w/ hx 2 adenomas removed from colon 2016 ? ? ?Past Medical History:  ?Diagnosis Date  ? ADD (attention deficit disorder)   ? on adderrall managed by neurology  ? Anxiety   ? on ativan managed by Neurology  ? Atrophy, cortical 2017  ? Mild generalized coritcal atrophy by MRI  ? Bartonella infection 2017  ? and reported Ehrlichia  ? CAD S/P percutaneous coronary angioplasty 06/2017  ? pLAD1 55% (not significant), p-mLAD2 80% & mLAD 70% (tandem lesions - DES PCI . Synergy DES 2.75 x 38 -- 3.0 mm).  mCx ~60% - med Rx, mRCA 30% & 40%.  ER 55-60%.  ? Cellulitis   ? Chronic traumatic encephalopathy   ? right frontal-temproal lobe  ? Complication of anesthesia   ? " BRAIN FOG "  ? Depression   ? Emphysema of lung ([place])   ? Erectile dysfunction 11/2016  ? follows with urology; sildenafil prescribed  ? Hx of adenomatous colonic polyps 07/2014  ? 2 diminutive adenomas  ? Hyperlipidemia   ? Hypertension   ? 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 Pneumonia   ? Primary hypogonadism in male   ? Raynaud's disease 09/13/2015  ? As complication of RMSF  ? Restless legs 09/13/2015  ? [place] spotted fever 2017  ? TBI (traumatic brain injury) 1997, 2009  ? "concussions"  ? Vertebral artery stenosis, asymptomatic, right   ? Likely atretic; large dominant left vertebral artery noted.  ? ? ?Past Surgical History:  ?Procedure Laterality Date  ? COLONOSCOPY  07/2014  ? 2 diminutive adenomas  ? CORONARY STENT INTERVENTION N/A 06/30/2018  ? Procedure: CORONARY STENT INTERVENTION;  Surgeon: [name], MD;  Location: [facility];;  p-mLAD2 80% & mLAD 70% (tandem lesions) - DES PCI Synergy DES 2.75 x 38 (3.0 mm).  ? CT CTA CORONARY W/CA SCORE W/CM &/OR WO/CM  06/2018  ? Cardiac cath score read 1318.  Moderate coronary  disease sent for FFR: mRCA 0.84 (non-significant), mLAD 0.64 (signifiant), pLOM(Cx) - 0,72 (significant - but distal lesion)  ? LEFT HEART CATH AND CORONARY ANGIOGRAPHY N/A 06/30/2018  ? Procedure: LEFT HEART CATH AND CORONARY ANGIOGRAPHY;  Surgeon: [name], MD;  Location: [facility];;  pLAD1 55% (not significant), p-mLAD2 80% & mLAD 70% (tandem lesions -> DES PCI). mCx ~60% (med Rx), mRCA 30% & 40%.  ER 55-60%.   ? TEE WITHOUT CARDIOVERSION N/A 09/06/2015  ? Procedure: TRANSESOPHAGEAL ECHOCARDIOGRAM (TEE);  Surgeon: Jerline Pain, MD;  R/O endocarditis;  ? TONSILLECTOMY  1965  ? ? ?Prior to Admission medications   ?Medication Sig Start Date End Date Taking? Authorizing Provider  ?amLODipine (NORVASC) 2.5 MG tablet Take 1 tablet (2.5 mg total) by mouth daily. 05/14/21 08/12/21 Yes [name], MD  ?amphetamine-dextroamphetamine (ADDERALL) 30 MG tablet Take 1-2 tablets by mouth daily. 11/14/20  Yes Hilts, Legrand Como, MD  ?aspirin 81 MG chewable tablet Chew by mouth daily.   Yes [provider]  ?b complex vitamins capsule Take 1 tablet by mouth daily.   Yes [provider]  ?Cholecalciferol 125 MCG (5000 UT) TABS  09/29/19  Yes [provider]  ?Coenzyme Q10 100 MG capsule Take by mouth.   Yes  [provider]  ?magnesium oxide (MAG-OX) 400 MG tablet Take by mouth.   Yes [provider]  ?Menaquinone-7 (VITAMIN K2) 100 MCG CAPS  09/29/19  Yes [provider]  ?testosterone (ANDROGEL) 50 MG/5GM (1%) GEL PLACE 5-10 GRAMS ONTO THE SKIN DAILY. 06/25/21  Yes [name], MD  ?Theanine 50 MG TBDP Take by mouth.   Yes [provider]  ?vitamin B-12 (CYANOCOBALAMIN) 500 MCG tablet Take by mouth.   Yes [provider]  ?vitamin E 180 MG (400 UNITS) capsule Take by mouth.   Yes [provider]  ?zinc gluconate 50 MG tablet Take by mouth.   Yes [provider]  ?Alpha-Lipoic Acid 300 MG CAPS Take by mouth.    [provider]  ?gabapentin (NEURONTIN) 100 MG capsule Take by mouth. ?Patient not taking: Reported on 07/02/2021 10/22/20   [provider]  ?Iron-Vitamin C (VITRON-C) 65-125 MG TABS Take by mouth.    [provider]  ?Misc Natural Products (PROSTATE SUPPORT PO) Take 1 capsule by mouth every evening. Prostate Plus    [provider]  ?nitroGLYCERIN (NITROSTAT) 0.4 MG SL tablet PLACE 1 TABLET UNDER TONGUE EVERY 5 MINS AS NEEDED FOR CHEST PAIN ?Patient not taking: Reported on 07/16/2021 05/10/21   [name], MD  ?Omega-3 Fatty Acids (SUPER OMEGA 3 EPA/DHA PO) Take 1 capsule by mouth daily.    [provider]  ?prednisoLONE acetate (PRED FORTE) 1 % ophthalmic suspension Place 1 drop into both eyes 4 (four) times daily as needed. Stop after 3-5 days. ?Patient not taking: Reported on 07/02/2021 11/14/20   Hilts, Legrand Como, MD  ?REPATHA SURECLICK 053 MG/ML SOAJ INJECT 140 MG INTO THE SKIN EVERY 14 (FOURTEEN) DAYS. 08/13/20   [name], MD  ? ? ?Current Outpatient Medications  ?Medication Sig Dispense Refill  ? amLODipine (NORVASC) 2.5 MG tablet Take 1 tablet (2.5 mg total) by mouth daily. 90 tablet 3  ? amphetamine-dextroamphetamine (ADDERALL) 30 MG tablet Take 1-2 tablets by mouth daily. 60 tablet 0  ? aspirin 81 MG chewable tablet Chew by mouth daily.    ? b complex vitamins capsule Take 1 tablet by mouth daily.    ? Cholecalciferol 125 MCG (5000 UT) TABS     ? Coenzyme Q10 100 MG capsule Take by mouth.    ? magnesium oxide (MAG-OX) 400 MG tablet Take by mouth.    ? Menaquinone-7 (VITAMIN K2) 100 MCG CAPS     ? testosterone (ANDROGEL) 50 MG/5GM (1%) GEL PLACE 5-10 GRAMS ONTO THE SKIN DAILY. 300 g 3  ? Theanine 50 MG TBDP Take by mouth.    ? vitamin B-12 (CYANOCOBALAMIN) 500 MCG tablet Take by mouth.    ? vitamin E 180 MG (400 UNITS) capsule Take by mouth.    ? zinc gluconate 50 MG tablet Take by mouth.    ? Alpha-Lipoic Acid 300 MG CAPS Take by mouth.    ? gabapentin (NEURONTIN)  100 MG capsule Take by mouth. (Patient not taking: Reported on 07/02/2021)    ? Iron-Vitamin C (VITRON-C) 65-125 MG TABS Take by mouth.    ? Misc Natural Products (PROSTATE SUPPORT PO) Take 1 capsule by mouth every evening. Prostate Plus    ? nitroGLYCERIN (NITROSTAT) 0.4 MG SL tablet PLACE 1 TABLET UNDER TONGUE EVERY 5 MINS AS NEEDED FOR CHEST PAIN (Patient not taking: Reported on 07/16/2021) 75 tablet 2  ? Omega-3 Fatty Acids (SUPER OMEGA 3 EPA/DHA PO) Take 1 capsule by mouth  daily.    ? prednisoLONE acetate (PRED FORTE) 1 % ophthalmic suspension Place 1 drop into both eyes 4 (four) times daily as needed. Stop after 3-5 days. (Patient not taking: Reported on 07/02/2021) 5 mL 0  ? REPATHA SURECLICK 361 MG/ML SOAJ INJECT 140 MG INTO THE SKIN EVERY 14 (FOURTEEN) DAYS. 2 mL 11  ? ?Current Facility-Administered Medications  ?Medication Dose Route Frequency Provider Last Rate Last Admin  ? 0.9 %  sodium chloride infusion  500 mL Intravenous Once [name], MD      ? ? ?Allergies as of 07/16/2021 - Review Complete 07/16/2021  ?Allergen Reaction Noted  ? Penicillins Hives 07/09/2011  ? Statins Other (See Comments) 08/11/2018  ? Methylprednisolone Other (See Comments) 09/05/2015  ? Penicillin g Other (See Comments) 05/31/2014  ? Amphetamines Other (See Comments) 09/05/2015  ? Lorazepam Other (See Comments) 09/05/2015  ? Propofol Other (See Comments) 09/05/2015  ? ? ?Family History  ?Problem Relation Age of Onset  ? Mental illness Mother   ? Colon polyps Father   ? Hyperlipidemia Father   ? Mental illness Brother   ? Heart disease Maternal Grandfather 60  ? Cancer Paternal Grandfather   ? Colon cancer Neg Hx   ? Esophageal cancer Neg Hx   ? Stomach cancer Neg Hx   ? Rectal cancer Neg Hx   ? ? ?Social History  ? ?Socioeconomic History  ? Marital status: Divorced  ?  Spouse name: [name]  ? Number of children: 2  ? Years of education: 24  ? Highest education level: Not on file  ?Occupational History  ? Occupation: Retired   ?Tobacco Use  ? Smoking status: Former  ?  Packs/day: 1.50  ?  Years: 25.00  ?  Pack years: 37.50  ?  Types: Cigarettes  ?  Quit date: 11/22/2014  ?  Years since quitting: 6.6  ? Smokeless tobacco: Never

## 2021-07-16 NOTE — Progress Notes (Signed)
Called to room to assist during endoscopic procedure.  Patient ID and intended procedure confirmed with present staff. Received instructions for my participation in the procedure from the performing physician.  

## 2021-07-16 NOTE — Progress Notes (Signed)
Report given to PACU, vss 

## 2021-07-18 ENCOUNTER — Telehealth: Payer: Self-pay

## 2021-07-18 ENCOUNTER — Telehealth: Payer: Self-pay | Admitting: *Deleted

## 2021-07-18 NOTE — Telephone Encounter (Signed)
?  Follow up Call-voicemail left.  ?

## 2021-07-18 NOTE — Telephone Encounter (Signed)
Left message on answering machine. 

## 2021-07-22 ENCOUNTER — Encounter: Payer: Self-pay | Admitting: Internal Medicine

## 2021-07-23 ENCOUNTER — Encounter: Payer: Self-pay | Admitting: Internal Medicine

## 2021-07-24 DIAGNOSIS — F4323 Adjustment disorder with mixed anxiety and depressed mood: Secondary | ICD-10-CM | POA: Diagnosis not present

## 2021-08-01 ENCOUNTER — Ambulatory Visit: Payer: BC Managed Care – PPO | Admitting: Internal Medicine

## 2021-08-01 DIAGNOSIS — E291 Testicular hypofunction: Secondary | ICD-10-CM | POA: Diagnosis not present

## 2021-08-19 ENCOUNTER — Other Ambulatory Visit: Payer: Self-pay | Admitting: Cardiology

## 2021-09-02 DIAGNOSIS — E611 Iron deficiency: Secondary | ICD-10-CM | POA: Diagnosis not present

## 2021-09-02 DIAGNOSIS — G4733 Obstructive sleep apnea (adult) (pediatric): Secondary | ICD-10-CM | POA: Diagnosis not present

## 2021-09-02 DIAGNOSIS — F5104 Psychophysiologic insomnia: Secondary | ICD-10-CM | POA: Diagnosis not present

## 2021-09-02 DIAGNOSIS — G4761 Periodic limb movement disorder: Secondary | ICD-10-CM | POA: Diagnosis not present

## 2021-09-02 DIAGNOSIS — Z6824 Body mass index (BMI) 24.0-24.9, adult: Secondary | ICD-10-CM | POA: Diagnosis not present

## 2021-09-06 ENCOUNTER — Encounter: Payer: Self-pay | Admitting: Internal Medicine

## 2021-09-06 ENCOUNTER — Ambulatory Visit (INDEPENDENT_AMBULATORY_CARE_PROVIDER_SITE_OTHER): Payer: BC Managed Care – PPO | Admitting: Internal Medicine

## 2021-09-06 VITALS — BP 140/80 | HR 71 | Ht 73.0 in | Wt 183.8 lb

## 2021-09-06 DIAGNOSIS — E230 Hypopituitarism: Secondary | ICD-10-CM

## 2021-09-06 DIAGNOSIS — N529 Male erectile dysfunction, unspecified: Secondary | ICD-10-CM | POA: Diagnosis not present

## 2021-09-06 NOTE — Patient Instructions (Addendum)
Please switch back to testosterone gel 1 packet every night. ? ?Please return to see me as needed. ? ? ?

## 2021-09-06 NOTE — Progress Notes (Signed)
Patient ID: Phillip [name], male   DOB: 1956-05-01, 66 y.o.   MRN: [id number]  ? ?This visit occurred during the SARS-CoV-2 public health emergency.  Safety protocols were in place, including screening questions prior to the visit, additional usage of staff PPE, and extensive cleaning of exam room while observing appropriate contact time as indicated for disinfecting solutions.  ? ?HPI: ?Phillip [name] is a 66 y.o.-year-old man, initially referred by his neurologist, Dr. [name], presenting for follow-up for hypogonadism.  Last visit 6 months ago.  He is here with his fianc?e. ? ?Interim history: ?Approximately 1.5 months ago he contacted me with a low testosterone level obtained in the urologist office.  Upon questioning, before this level, he moved his testosterone gel from evening to morning due to insomnia: ?Per message from his fiancee - 07/23/2021: ?"After paying attention to his sleep patterns he saw that he got better sleep when he didnt apply at night. About 2 1/2 weeks ago he began applying 1 tube ('50mg'$  in 5g of gel) every morning after a showering.  ? When he got his labs on back a week ago he started applying 1 1/2 tubes in morning after showering with no great results. ? Today he applied 2 tubes after showering. He read it helps absorption to apply lotion on top of that...so he did that. He continues to have the hot flashes and feels his hormones are still "off". ?He has ED, managed by urology.  This improved over the last few years.  Dr. Diona Fanti is retiring so he is looking for a new urologist.  He did see another urologist recently and he was started on testosterone and then take 100 mg weekly.  He took 2 doses but he did not feel good on this.  Blood pressure and heart rate increased. ?He continues to have insomnia.  He is on gabapentin for involuntary leg movements.  He also takes medical marijuana. ? 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  He was given refills of generic sildenafil. ?-Was advised to continue 0.3 cc of testosterone cypionate weekly (200 mg / 1 mL) ?-On genital exam, he had mildly atrophic right and left testicles and a prostate of approximately 30 g,  without abnormalities ? 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 He had a subsequent visit with his PCP in 09/2015 >> a testosterone level was found to be extremely low. ? ? Received records from urology >> reviewed: ?Patient's initial free testosterone level obtained by PCP was 0.4 pg/mL (09/17/2015). At that time, he complained of low libido, testicular atrophy, difficulty obtaining and maintaining an erection. ?A repeat testosterone level obtained by an integrative medicine provider in [place] (09/25/2015) showed again very low testosterone level: Total Testosterone <3 ng/dL, and the free testosterone of 0.2 ng/dL. DHEAS was 122.4 (48.9-344.2). Of note, no LH and FSH levels were drawn at that time. ?At that point, he was started on testosterone cypionate 8 IM 0.8 mL every other week. This has not improved his libido significantly, but it helped him obtaining morning erections.  ?A genital exam was performed on 11/09/2015 by his urologist: Atrophic left and right testes. No tenderness, swelling, masses, varicocele, or hydrocele. No abnormality of the penis. Circumcised status. Prostate: 2+ in size, 40 g. Normal consistency  ?Dr. Diona Fanti obtain the following labs - 11/09/2015 - collection time: 3:34 PM  ?  Total testosterone 1125.7 (300-890) ?Free testosterone 175.3 (47-244) ?SHBG 66.3 (10-57)  ?FSH 0.1, LH 0.1 ?Prolactin 6.3 ?Of note, at the time of the above collection, patient was taking 0.8 mL testosterone every other week in his deltoid. The dose was changed at that time to 0.5 mL every week in his quadriceps, after which his fatigue improved a little, and he had  more energy.  ? 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 He saw Dr. [name], who obtained a  brain  + pituitary MRI (10/24/2015). 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 There was no abnormality in the pituitary gland. ? ?He admits for previous decreased libido >> normal now ?Had difficulty obtaining or maintaining an erection >> not anymore ?No trauma to testes, testicular irradiation or surgery ?No h/o of mumps orchitis/h/o autoimmune ds. ?No h/o cryptorchidism ?He grew and went through puberty like his peers ?+ shrinking of testes. No very small testes (<5 ml) ?No incomplete/delayed sexual development  ?   No breast discomfort/gynecomastia ?   No loss of body hair (axillary/pubic)/decreased need for shaving ?No height loss ?No abnormal sense of smell  ?+ hot flushes, resolved ?No vision problems, other than blurry vision in R eye, also photopsia - started 11/2015 ?No worst HA of his life ?He does have a history of head trauma in 1997, when a tree fell on top of his head and he lost consciousness for more than 30 minutes; in 2009 he fell off ladder and landed on back of his head ?No FH of hypogonadism/infertility ?No personal h/o infertility - has 2 children: 37 and 12 years old  ?No FH of hemochromatosis or pituitary tumors ?No excessive weight gain or loss.  ?No chronic diseases, but recently diagnosed with RMSF Spring 2017 ?No chronic pain. Not on opiates, does not take steroids, had a course this spring - ~1 week ?He was drinking Alcohol: ~2 drinks a day up to 2015 >>  stopped completely since then ?No anabolic steroids use ?No herbal medicines. ?Not on antidepressants ? ?No AI ds in his family, no FH of MS. He does not have family history of early cardiac disease. ? ?Reviewed pertinent labs:  ? 07/19/2021: Total  testosterone 96 (264-916), estradiol <5 -upon questioning, he had moved his testosterone gel from evening to morning before this result.  I advised him to move it back tonight ? ?Component ?    Latest Ref Rng & Units 03/26/2021  ?Testosterone, Serum (Total) ?    ng/dL 1,163 (H)  ?% Free Testosterone ?    % 0.7  ?Free Testosterone, S ?    pg/mL 81  ?Sex Hormone Binding Globulin ?    nmol/L 98.1 (H)  ? ?03/13/2021: Total testosterone 207.2 (300-190), free testosterone 29 (47-244) (calculated 28), SHBG 53 (10-57) ? ?Initially after switching to the testosterone gel: ?Component ?    Latest Ref Rng & Units 07/02/2020  ?Testosterone, Serum (Total) ?    ng/dL 724  ?% Free Testosterone ?    % 0.8  ?Free Testosterone, S ?    pg/mL 58  ?Sex Hormone Binding Globulin ?    nmol/L 65.9  ? ?Component ?    Latest Ref Rng & Units 02/10/2020 04/12/2020  ?Testosterone, Serum (Total) ?    ng/dL 749 966 (H)  ?% Free Testosterone ?    % 0.6 0.4  ?Free Testosterone,  S ?    pg/mL 45 (L) 39 (L)  ?Sex Hormone Binding Globulin ?    nmol/L 78.1 (H) 95.7 (H)  ? ?Component ?    Latest Ref Rng & Units 10/25/2018  ?Testosterone, Serum (Total) ?    ng/dL 813  ?% Free Testosterone ?    % 1.3  ?Free Testosterone, S ?    pg/mL 106  ?Sex Hormone Binding Globulin ?    nmol/L 59.2  ?Estradiol, Free ?    pg/mL 0.46 (H)  ?Estradiol ?    ADULTS: < OR = 0.45 pg/mL 22  ?PSA ?    0.10 - 4.00 ng/mL 0.51  ? ?Component ?    Latest Ref Rng & Units 07/20/2017 10/12/2017 12/22/2017  ?Testosterone, Serum (Total) ?    ng/dL  718   ?% Free Testosterone ?    %  0.8   ?Free Testosterone, S ?    pg/mL  57   ?Sex Hormone Binding Globulin ?    nmol/L  83.5 (H)   ?Testosterone ?    264 - 916 ng/dL 350    ?Testosterone Free ?    7.2 - 24.0 pg/mL 4.1 (L)    ?Sex Horm Binding Glob, Serum ?    19.3 - 76.4 nmol/L 100.7 (H)    ?Estradiol, Free ?    pg/mL   0.38  ?Estradiol ?    pg/mL   19  ? ?Initially: ?Component ?    Latest Ref Rng & Units 01/28/2016  ?Testosterone ?    264 - 916 ng/dL  1,103 (H)  ?Testosterone Free ?    7.2 - 24.0 pg/mL 15.1  ?Sex Horm Binding Glob, Serum ?    19.3 - 76.4 nmol/L 80.8 (H)  ?TSH ?    0.35 - 4.50 uIU/mL 1.90  ?Triiodothyronine,Free,Serum ?    2.3 - 4.2 pg/mL 3.9  ?T

## 2021-09-11 DIAGNOSIS — F4323 Adjustment disorder with mixed anxiety and depressed mood: Secondary | ICD-10-CM | POA: Diagnosis not present

## 2021-09-26 ENCOUNTER — Telehealth: Payer: Self-pay | Admitting: Cardiology

## 2021-09-26 ENCOUNTER — Encounter: Payer: Self-pay | Admitting: Cardiology

## 2021-09-26 NOTE — Telephone Encounter (Signed)
Interesting response to blood pressure.  I think if you have Plavix may not hurt to be back on it but I do not think that the risk of stent closure is significant.  I think this is probably response of being on the testosterone.  Hopefully after a few days of pressure will improve.    If you do have Plavix go ahead and take it.  If not, I think you are safe to stay on aspirin.  [name], MD

## 2021-09-26 NOTE — Telephone Encounter (Signed)
Patient's friend called and said that Dr. Ellyn Hack wants him to take 10 mg of Amlodipine and to send prescription to pharmacy but i'm not showing where Dr. Ellyn Hack approved of dosage. Also said that they discussed it via Mychart messages.

## 2021-09-27 DIAGNOSIS — E291 Testicular hypofunction: Secondary | ICD-10-CM | POA: Diagnosis not present

## 2021-09-27 DIAGNOSIS — N5201 Erectile dysfunction due to arterial insufficiency: Secondary | ICD-10-CM | POA: Diagnosis not present

## 2021-09-27 IMAGING — CT CT CHEST LUNG CANCER SCREENING LOW DOSE W/O CM
2 of 5 series · 15 of 40 positions shown, 18 images · non-contrast
Comparison: 04/19/2018.

CLINICAL DATA: Former smoker, quit 12 years ago, 38 pack-year
history.

EXAM:
CT CHEST WITHOUT CONTRAST LOW-DOSE FOR LUNG CANCER SCREENING
TECHNIQUE: Multidetector CT imaging of the chest was performed following the
standard protocol without IV contrast.

[Series 4: lung 1.00 br44 cor · coronal · 0.70mm/px · 3 of 302 slices shown]
[im 61/302  lung]
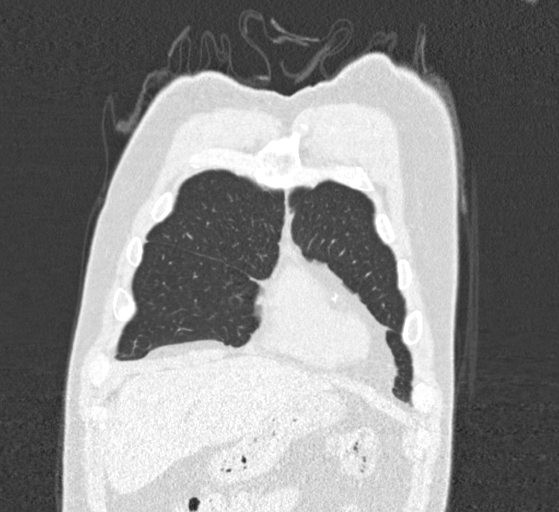
[im 121/302  lung]
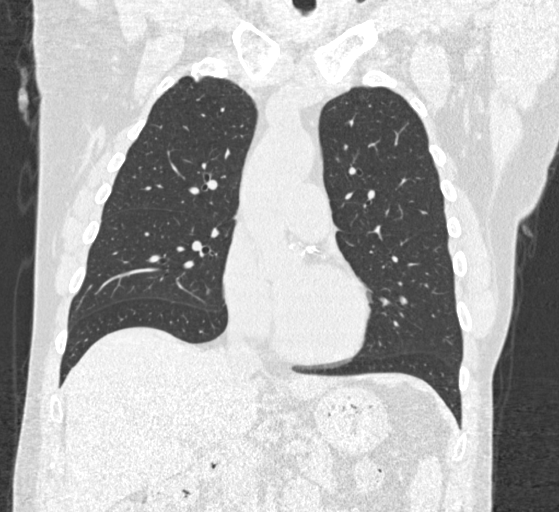
[im 181/302  lung]
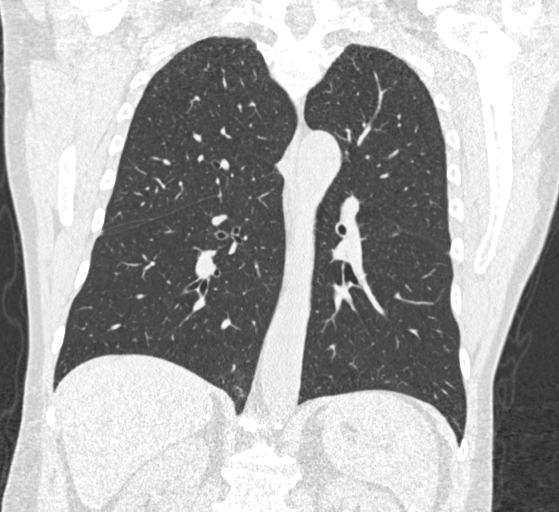

[Series 9: lung 1.00 br60 axial · axial · 0.77mm/px · z∈[-1071,-747]mm · 12 of 360 slices shown, 15 images]
[im 18/360  mediastinal]
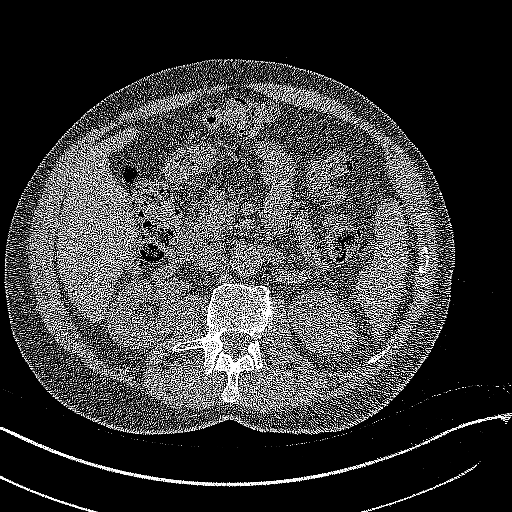
[im 18/360  lung]
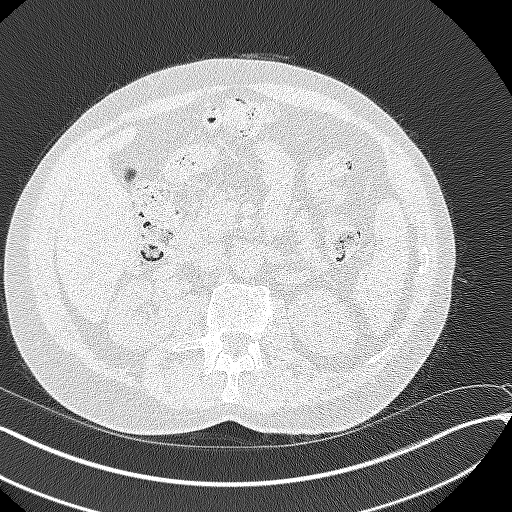
[im 52/360  lung]
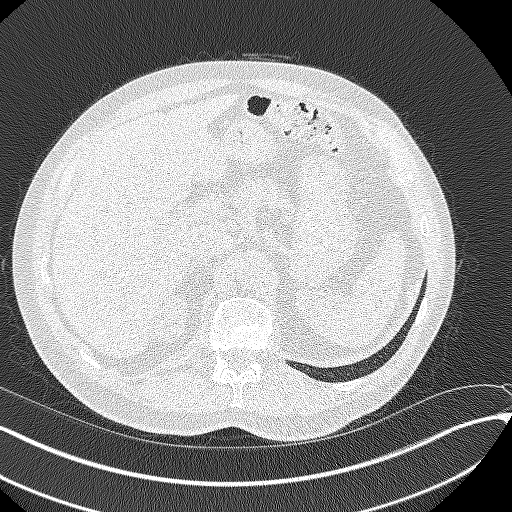
[im 86/360  lung]
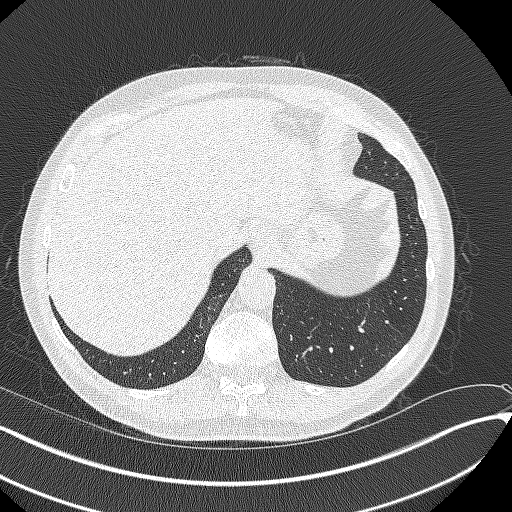
[im 103/360  lung]
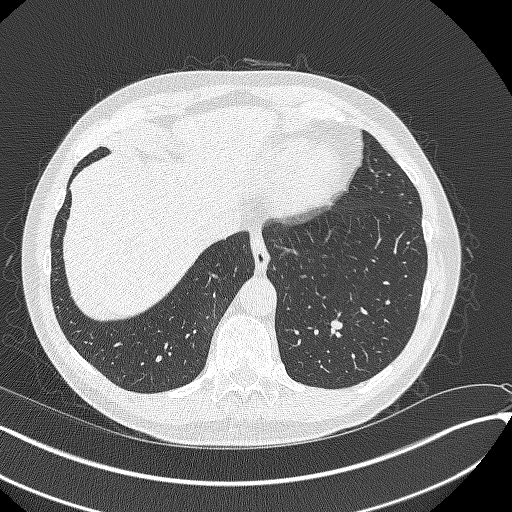
[im 137/360  mediastinal]
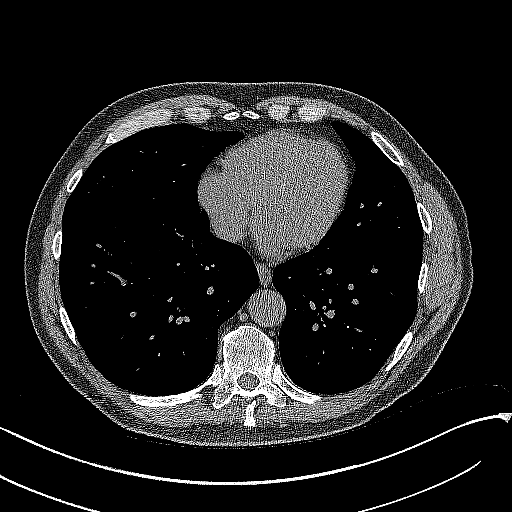
[im 137/360  lung]
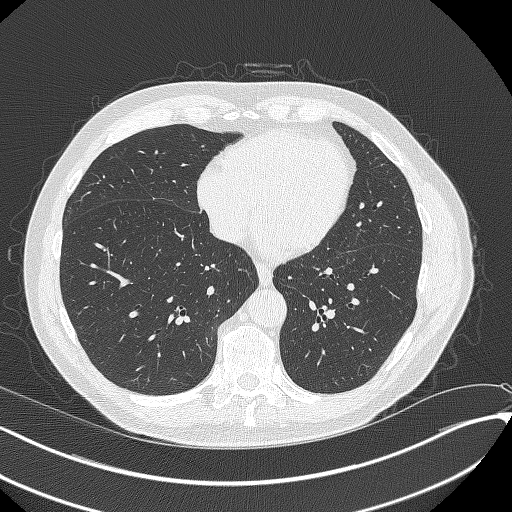
[im 171/360  lung]
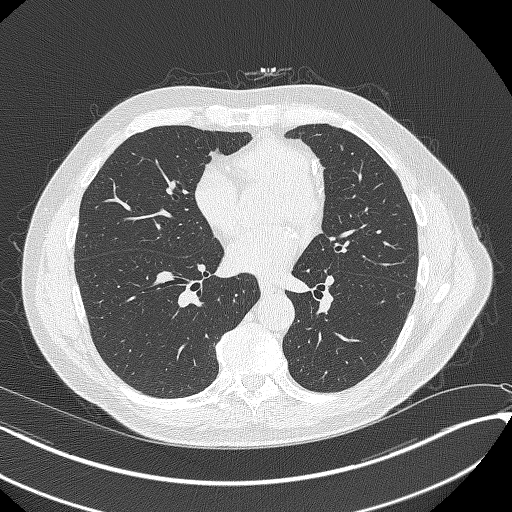
[im 189/360  lung]
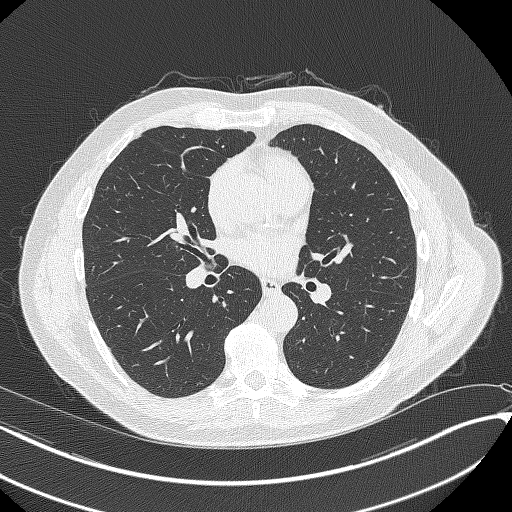
[im 223/360  lung]
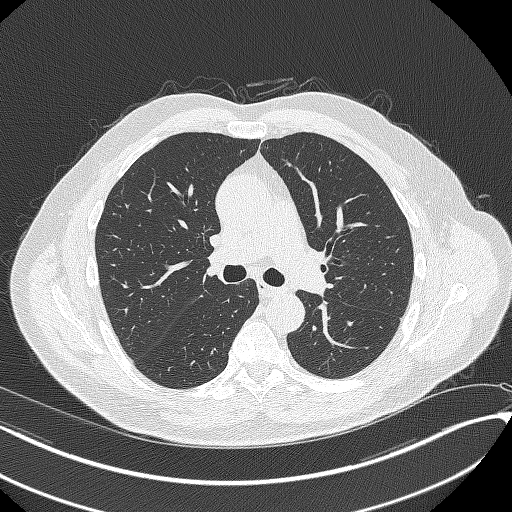
[im 257/360  mediastinal]
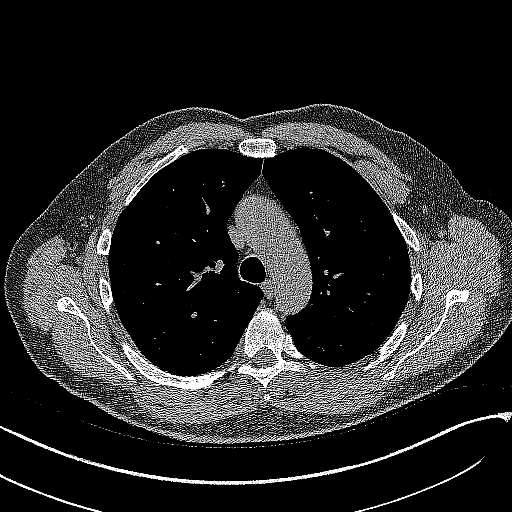
[im 257/360  lung]
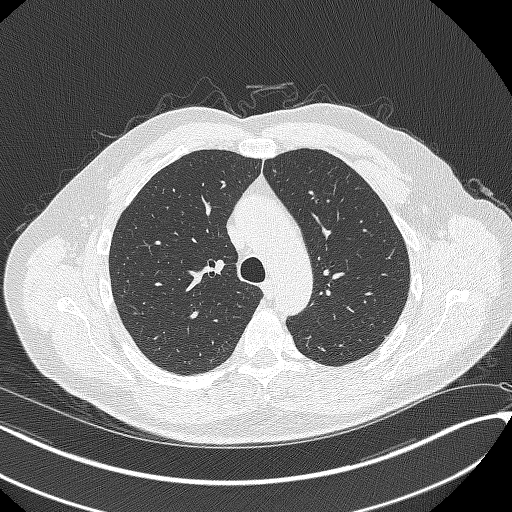
[im 274/360  lung]
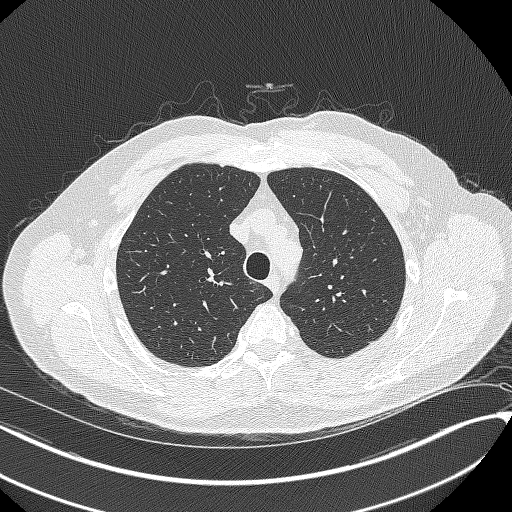
[im 308/360  lung]
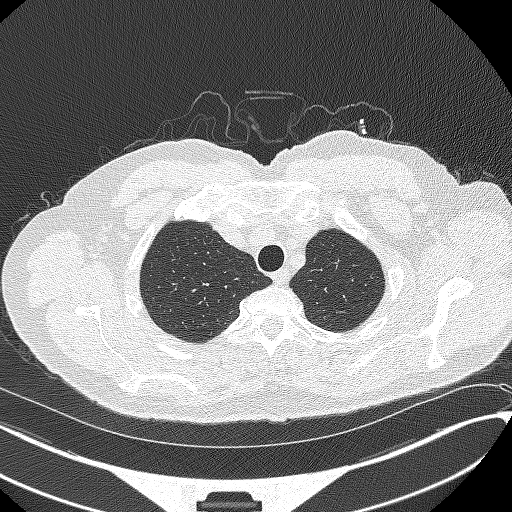
[im 342/360  lung]
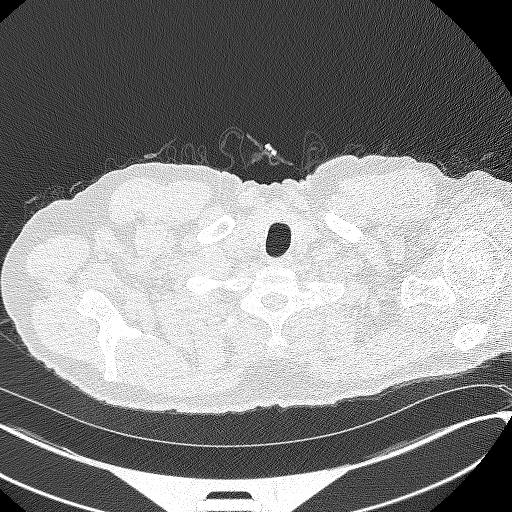

[15 of 40 positions shown; findings below may reference images not displayed]

FINDINGS: Cardiovascular: Coronary artery calcification. Heart size normal. No
pericardial effusion.

Mediastinum/Nodes: No pathologically enlarged mediastinal or
axillary lymph nodes. Hilar regions are difficult to definitively
evaluate without IV contrast but appear grossly unremarkable.
Esophagus is grossly unremarkable.

Lungs/Pleura: Mild centrilobular emphysema. No worrisome pulmonary
nodules. No pleural fluid. Airway is unremarkable.

Upper Abdomen: Visualized portions of the liver, gallbladder,
adrenal glands, kidneys, spleen, pancreas, stomach and bowel are
grossly unremarkable. No upper abdominal adenopathy.

Musculoskeletal: Degenerative changes in the spine.
IMPRESSION: 1. Lung-RADS 1, negative. Continue annual screening with low-dose
chest CT without contrast in 12 months.
2. Coronary artery calcification.
3.  Emphysema ([id number]).

## 2021-09-29 NOTE — Telephone Encounter (Signed)
I do not recall having recommended increasing amlodipine dose.  Med effect the last month saw he decreased the amlodipine to 2.5 mg daily.  If his blood pressures were to go up we will go back up to you.  2.5 mg twice daily or 5 mg daily.  [name], MD

## 2021-10-01 ENCOUNTER — Telehealth: Payer: Self-pay | Admitting: Cardiology

## 2021-10-01 ENCOUNTER — Encounter: Payer: Self-pay | Admitting: Cardiology

## 2021-10-01 MED ORDER — AMLODIPINE BESYLATE 2.5 MG PO TABS: 5.0000 mg | ORAL_TABLET | Freq: Every day | ORAL | 2 refills | Status: DC

## 2021-10-01 NOTE — Telephone Encounter (Signed)
*  STAT* If patient is at the pharmacy, call can be transferred to refill team.   1. Which medications need to be refilled? (please list name of each medication and dose if known) amLODipine (NORVASC) 10 MG tablet  2. Which pharmacy/location (including street and city if local pharmacy) is medication to be sent to? [id number]- [place], [place] - [address]  3. Do they need a 30 day or 90 day supply? 90 day

## 2021-10-01 NOTE — Telephone Encounter (Signed)
Sent message by [facility] 10/01/21

## 2021-10-02 ENCOUNTER — Other Ambulatory Visit (HOSPITAL_COMMUNITY): Payer: Self-pay | Admitting: Family Medicine

## 2021-10-02 ENCOUNTER — Ambulatory Visit (HOSPITAL_COMMUNITY)
Admission: RE | Admit: 2021-10-02 | Discharge: 2021-10-02 | Disposition: A | Discharge status: Home / Self Care | Payer: BC Managed Care – PPO | Source: Ambulatory Visit | Attending: Cardiology | Admitting: Cardiology

## 2021-10-02 DIAGNOSIS — M79605 Pain in left leg: Secondary | ICD-10-CM | POA: Insufficient documentation

## 2021-10-02 MED ORDER — AMLODIPINE BESYLATE 10 MG PO TABS: 10.0000 mg | ORAL_TABLET | Freq: Every day | ORAL | 1 refills | Status: DC

## 2021-10-02 NOTE — Telephone Encounter (Signed)
I discontinued the know what the blood pressure numbers are currently, on current medications and what he is actually taking.  If he is indeed taking 10 mg of amlodipine, then I am fine refilling it, -> I guess this was an adjustment that was made in response to blood pressure.  Can refill 10 mg Amlodipine  [name], MD

## 2021-10-02 NOTE — Telephone Encounter (Signed)
Duplicate mychart message. Please see updated enounter

## 2021-10-02 NOTE — Telephone Encounter (Signed)
Refill has been sent to the pharmacy for Amlodipine 10 MG.

## 2021-10-03 NOTE — Telephone Encounter (Signed)
Swelling can be from higher dose of amlodipine.   I suspect that the Testosterone may be driving BP.   Not overly concerned about these BP levels for short term -- not dangerous, just not desirable for long term.   [place]

## 2021-10-07 ENCOUNTER — Telehealth: Payer: Self-pay

## 2021-10-07 ENCOUNTER — Encounter: Payer: Self-pay | Admitting: Cardiology

## 2021-11-01 DIAGNOSIS — E291 Testicular hypofunction: Secondary | ICD-10-CM | POA: Diagnosis not present

## 2021-11-18 ENCOUNTER — Ambulatory Visit: Payer: BC Managed Care – PPO | Admitting: Internal Medicine

## 2021-11-25 ENCOUNTER — Ambulatory Visit: Payer: BC Managed Care – PPO | Admitting: Internal Medicine

## 2021-12-03 DIAGNOSIS — E291 Testicular hypofunction: Secondary | ICD-10-CM | POA: Diagnosis not present

## 2021-12-26 DIAGNOSIS — F4323 Adjustment disorder with mixed anxiety and depressed mood: Secondary | ICD-10-CM | POA: Diagnosis not present

## 2022-01-01 DIAGNOSIS — E291 Testicular hypofunction: Secondary | ICD-10-CM | POA: Diagnosis not present

## 2022-01-01 DIAGNOSIS — R948 Abnormal results of function studies of other organs and systems: Secondary | ICD-10-CM | POA: Diagnosis not present

## 2022-01-29 DIAGNOSIS — E291 Testicular hypofunction: Secondary | ICD-10-CM | POA: Diagnosis not present

## 2022-01-29 DIAGNOSIS — N5201 Erectile dysfunction due to arterial insufficiency: Secondary | ICD-10-CM | POA: Diagnosis not present

## 2022-02-05 ENCOUNTER — Other Ambulatory Visit: Payer: Self-pay | Admitting: Cardiology

## 2022-02-24 DIAGNOSIS — E291 Testicular hypofunction: Secondary | ICD-10-CM | POA: Diagnosis not present

## 2022-03-07 ENCOUNTER — Other Ambulatory Visit: Payer: Self-pay | Admitting: Cardiology

## 2022-05-21 ENCOUNTER — Encounter: Payer: Self-pay | Admitting: Cardiology

## 2022-05-21 ENCOUNTER — Ambulatory Visit: Payer: BC Managed Care – PPO | Attending: Cardiology | Admitting: Cardiology

## 2022-05-21 VITALS — BP 134/84 | HR 73 | Ht 73.0 in | Wt 188.2 lb

## 2022-05-21 DIAGNOSIS — G720 Drug-induced myopathy: Secondary | ICD-10-CM

## 2022-05-21 DIAGNOSIS — E230 Hypopituitarism: Secondary | ICD-10-CM

## 2022-05-21 DIAGNOSIS — I2510 Atherosclerotic heart disease of native coronary artery without angina pectoris: Secondary | ICD-10-CM | POA: Diagnosis not present

## 2022-05-21 DIAGNOSIS — Z9861 Coronary angioplasty status: Secondary | ICD-10-CM

## 2022-05-21 DIAGNOSIS — I10 Essential (primary) hypertension: Secondary | ICD-10-CM

## 2022-05-21 DIAGNOSIS — I7300 Raynaud's syndrome without gangrene: Secondary | ICD-10-CM

## 2022-05-21 DIAGNOSIS — E785 Hyperlipidemia, unspecified: Secondary | ICD-10-CM

## 2022-05-21 DIAGNOSIS — T466X5A Adverse effect of antihyperlipidemic and antiarteriosclerotic drugs, initial encounter: Secondary | ICD-10-CM

## 2022-05-21 NOTE — Patient Instructions (Addendum)
Medication Instructions:   No changes *If you need a refill on your cardiac medications before your next appointment, please call your pharmacy*   Lab Work:fASTING CMP LIPID TSH If you have labs (blood work) drawn today and your tests are completely normal, you will receive your results only by: [place] (if you have MyChart) OR A paper copy in the mail If you have any lab test that is abnormal or we need to change your treatment, we will call you to review the results.   Testing/Procedures:  Not needed  Follow-Up: At [facility], you and your health needs are our priority.  As part of our continuing mission to provide you with exceptional heart care, we have created designated Provider Care Teams.  These Care Teams include your primary Cardiologist (physician) and Advanced Practice Providers (APPs -  Physician Assistants and Nurse Practitioners) who all work together to provide you with the care you need, when you need it.     Your next appointment:   12 month(s) CALL in Aug /Sept for Jan 2025 APPT.  The format for your next appointment:   In Person  Provider:   [name], MD

## 2022-05-21 NOTE — Progress Notes (Signed)
Primary Care Provider: [name], MD [place] Cardiologist: [name], MD Electrophysiologist: None  Clinic Note: Chief Complaint  Patient presents with   Follow-up    Annual    Coronary Artery Disease    No anginal symptoms.  Has made significant adjustments to diet and increasing his exercise level.   ===================================  ASSESSMENT/PLAN   Problem List Items Addressed This Visit       Cardiology Problems   Coronary artery disease involving native coronary artery of native heart without angina pectoris - Primary (Chronic)    2 years out from PCI.He is doing well with no active angina.  His energy levels doing well on low-dose testosterone.   Since his last visit, his amlodipine dose has been titrated up to 10 mg daily.  I have indicated that if he has some low blood pressure readings intermittently, they can switch to 5 mg twice daily. Not on beta-blocker because of fatigue. Has been on amlodipine as opposed to ARB because of antianginal benefit. Due to statin myopathy he is on Repatha.  Doing well.  Plan follow-up labs.  We will contact him to ensure that he is actually taking his aspirin.        Relevant Orders   Lipid panel   Comprehensive metabolic panel   TSH   EKG 12-Lead (Completed)   CAD S/P percutaneous coronary angioplasty (Chronic)    DES PCI of the proximal mid LAD almost 2 years ago.    Plan: No longer on Plavix but I do not see aspirin listed.  I reiterated the importance of taking 81 mg aspirin.      Hyperlipidemia with target LDL less than 70 (Chronic)    Stable on Repatha.  He is due for labs to be checked.  Will plan to check lipid panel and chemistries and TSH. He has made dramatic changes to his diet and has increased levels exercise which is also helping his lipid control.      Relevant Orders   Lipid panel   Comprehensive metabolic panel   TSH   Raynaud's disease (Chronic)    Pretty stable.  Yet  again another reason for using amlodipine for blood pressure over ARB.      Essential hypertension (Chronic)    Blood pressure looks relatively controlled.  His amlodipine dose was increased up to 10 mg he has been taking it 5 mg twice daily which I think is neither he nor there whether he takes it 5 mg twice daily at 10 mg a day, which seems to confirm what he is doing.  I have previously reduced to 2.5 mg but his blood pressures apparently went up.  If his pressures increase further, would probably consider ARB as the next option. Not on beta-blocker because of fatigue.      Relevant Orders   Lipid panel   Comprehensive metabolic panel   TSH     Other   Statin myopathy (Chronic)   Relevant Orders   Lipid panel   Comprehensive metabolic panel   TSH   Hypogonadotropic hypogonadism (HCC) (Chronic)    Continues to use testosterone supplementation.  He is asking about other methods of getting testosterone rather than just to the gel.  I recommend he work with his PCP or even potentially urology./endocrinology.  Would not treat to supratherapeutic levels.        ===================================  HPI:    Phillip [name] is a 67 y.o. male with a PMH below who  presents today for annual f/u.  CAD -February 2020: referred for-Coronary Calcium Score abnormal-Coronary CTA with LAD disease  => cath with LAD stenosis-PCI  Myoview 07/11/2020: Exercised ~14 min.  15.7 METS.  EF 50 to 55%.  No ischemia infarction.  He did report some chest tightness.  Phillip [name] was last seen in Jan 2023: Doing well. Staying Active - exercising several days / week.  Mother had just died - lots of stress.  No CP / pressure with rest or exertion. Mild EOD swelling. Rare short-lived fluttering.   Recent Hospitalizations: n/a  Reviewed  CV studies:    The following studies were reviewed today: (if available, images/films reviewed: From Epic Chart or Care Everywhere) LE Venous Dopplers 10/02/2021: No  evidence of DVT.  Anechoic palpable structure in the lateral side of the left calf.  Interval History:   Phillip [name] returns here today for annual follow-up doing very well.  He is accompanied by his wife who is quite involved in his care and has multiple questions that were asked and answered.  We spent quite a bit of time reviewing his cath films lab results and medications.  CV Review of Symptoms (Summary): Cardiovascular ROS: no chest pain or dyspnea on exertion negative for - edema, loss of consciousness, orthopnea, palpitations, paroxysmal nocturnal dyspnea, rapid heart rate, shortness of breath, or syncope or near syncope.  TIA/amaurosis fugax.  Claudication.  As part of his changes from last year, he remarried.  His wife is with him today.  They have adjusted their diet to where he is basically eating almost a vegetarian diet.  He is very active and exercises routinely.  He walks 3 to 4 days a week now if not more.  He also does light weights and calisthenics.  His energy level is definitely improving but he is still having some issues with the testosterone.  REVIEWED OF SYSTEMS   Review of Systems  Constitutional:  Negative for chills, fever, malaise/fatigue and weight loss.  Respiratory:  Negative for cough.   Gastrointestinal:  Negative for blood in stool and melena.  Genitourinary:  Negative for hematuria.  Musculoskeletal:  Positive for joint pain (Mild aches and pains). Negative for falls.  Neurological:  Positive for headaches (Stable). Negative for dizziness (Much less frequent.).  Psychiatric/Behavioral:  Negative for depression and memory loss (He still has some memory loss, but is doing better with his better diet.). The patient is not nervous/anxious.     I have reviewed and (if needed) personally updated the patient's problem list, medications, allergies, past medical and surgical history, social and family history.   PAST MEDICAL HISTORY   Past Medical History:   Diagnosis Date   ADD (attention deficit disorder)    on adderrall managed by neurology   Anxiety    on ativan managed by Neurology   Atrophy, 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 Synergy DES 2.75 x 38 -- 3.0 mm).  mCx ~60% - med Rx, mRCA 30% & 40%.  ER 55-60%.   Cellulitis    Chronic traumatic encephalopathy    right frontal-temproal lobe   Complication of anesthesia    " BRAIN FOG "   Depression    Emphysema of lung ([place])    Erectile dysfunction 11/2016  follows with urology; sildenafil prescribed   Hx of adenomatous colonic polyps 07/2014   2 diminutive adenomas   Hyperlipidemia    Hypertension    Meningoencephalitis 1994   [facility]   Pneumonia    Primary hypogonadism in male    Raynaud's disease 19/37/9024   As complication of RMSF   Restless legs 09/13/2015   [facility] spotted fever 2017   TBI (traumatic brain injury) ([place]) 1997, 2009   "concussions"   Vertebral artery stenosis, asymptomatic, right    Likely atretic; large dominant left vertebral artery noted.    PAST SURGICAL HISTORY   Past Surgical History:  Procedure Laterality Date   COLONOSCOPY  07/2014   2 diminutive adenomas   CORONARY STENT INTERVENTION N/A 06/30/2018   Procedure: CORONARY STENT INTERVENTION;  Surgeon: [name], MD;  Location: [facility];;  p-mLAD2 80% & mLAD 70% (tandem lesions) - DES PCI Synergy DES 2.75 x 38 (3.0 mm).   CT CTA CORONARY W/CA SCORE W/CM &/OR WO/CM  06/2018   Cardiac cath score read 1318.  Moderate coronary disease sent for FFR: mRCA 0.84 (non-significant), mLAD 0.64 (signifiant), pLOM(Cx) - 0,72 (significant - but distal lesion)   LEFT HEART CATH AND CORONARY ANGIOGRAPHY N/A 06/30/2018   Procedure: LEFT HEART CATH AND CORONARY ANGIOGRAPHY;   Surgeon: [name], MD;  Location: [facility] INVASIVE CV LAB;;  pLAD1 55% (not significant), p-mLAD2 80% & mLAD 70% (tandem lesions -> DES PCI). mCx ~60% (med Rx), mRCA 30% & 40%.  ER 55-60%.    TEE WITHOUT CARDIOVERSION N/A 09/06/2015   Procedure: TRANSESOPHAGEAL ECHOCARDIOGRAM (TEE);  Surgeon: Jerline Pain, MD;  R/O endocarditis;   TONSILLECTOMY  1965    Immunization History  Administered Date(s) Administered   PFIZER(Purple Top)SARS-COV-2 Vaccination 07/17/2019, 08/08/2019, 04/05/2020   Tdap 05/05/2010, 01/08/2021    MEDICATIONS/ALLERGIES   Aspirin 81 mg p.o. daily. Current Meds  Medication Sig   Alpha-Lipoic Acid 300 MG CAPS Take by mouth.   amLODipine (NORVASC) 10 MG tablet TAKE 1 TABLET BY MOUTH EVERY DAY   amphetamine-dextroamphetamine (ADDERALL) 30 MG tablet Take 1-2 tablets by mouth daily.   b complex vitamins capsule Take 1 tablet by mouth daily.   Cholecalciferol 125 MCG (5000 UT) TABS    Coenzyme Q10 100 MG capsule Take by mouth.   Iron-Vitamin C (VITRON-C) 65-125 MG TABS Take by mouth.   magnesium oxide (MAG-OX) 400 MG tablet Take by mouth.   Menaquinone-7 (VITAMIN K2) 100 MCG CAPS    Misc Natural Products (PROSTATE SUPPORT PO) Take 1 capsule by mouth every evening. Prostate Plus   nitroGLYCERIN (NITROSTAT) 0.4 MG SL tablet PLACE 1 TABLET UNDER TONGUE EVERY 5 MINS AS NEEDED FOR CHEST PAIN   Omega-3 Fatty Acids (SUPER OMEGA 3 EPA/DHA PO) Take 1 capsule by mouth daily.   prednisoLONE acetate (PRED FORTE) 1 % ophthalmic suspension Place 1 drop into both eyes 4 (four) times daily as needed. Stop after 3-5 days.   REPATHA SURECLICK 097 MG/ML SOAJ INJECT 140 MG INTO THE SKIN EVERY 14 (FOURTEEN) DAYS.   testosterone (ANDROGEL) 50 MG/5GM (1%) GEL SMARTSIG:50 Milligram(s) Topical Daily   Theanine 50 MG TBDP Take by mouth.   vitamin B-12 (CYANOCOBALAMIN) 500 MCG tablet Take by mouth.   vitamin E 180 MG (400 UNITS) capsule Take by mouth.   zinc gluconate 50 MG tablet Take by  mouth.    Allergies  Allergen Reactions   Penicillins Hives    Did it involve swelling of the  face/tongue/throat, SOB, or low BP? Unknown Did it involve sudden or severe rash/hives, skin peeling, or any reaction on the inside of your mouth or nose? Yes Did you need to seek medical attention at a hospital or doctor's office? Unknown When did it last happen? Occurred at age 88 or 27 If all above answers are "NO", may proceed with cephalosporin use.    Statins Other (See Comments)    confusion   Methylprednisolone Other (See Comments)    Unsure of exact reaction type   Penicillin G Other (See Comments)   Amphetamines Other (See Comments)    Intolerant to specific formulations: [employer] amphetamine TEVA dextroamp amphetamine   Lorazepam Other (See Comments)    Mylan lorazepam; others ok   Propofol Other (See Comments)    Cognitive delay and emotional    SOCIAL HISTORY/FAMILY HISTORY   Reviewed in Epic:  Pertinent findings:  Social History   Tobacco Use   Smoking status: Former    Packs/day: 1.50    Years: 25.00    Total pack years: 37.50    Types: Cigarettes    Quit date: 11/22/2014    Years since quitting: 7.5   Smokeless tobacco: Never   Tobacco comments:    Encouraged to remain smoke free  Vaping Use   Vaping Use: Never used  Substance Use Topics   Alcohol use: No    Comment: occasional   Drug use: No   Social History   Social History Narrative   Divorced -Now recently remarried over the summer 2023.      . B.A. degree. Retired.   Drinks caffeine, uses herbal remedies, takes a daily vitamin.   Wears his seatbelt.  Smoke detector in the home.   Firearms locked in the home.   Feels safe in relationships.       He now has switched to a vegan diet.  Notably increase exercise level.  Re-married   OBJCTIVE -PE, EKG, labs   Wt Readings from Last 3 Encounters:  05/21/22 188 lb 3.2 oz (85.4 kg)  09/06/21 183 lb 12.8 oz (83.4 kg)  07/16/21 184 lb (83.5 kg)     Physical Exam: BP 134/84   Pulse 73   Ht '6\' 1"'$  (1.854 m)   Wt 188 lb 3.2 oz (85.4 kg)   SpO2 98%   BMI 24.83 kg/m  Physical Exam Vitals reviewed.  Constitutional:      General: He is not in acute distress.    Appearance: Normal appearance. He is normal weight. He is not ill-appearing or toxic-appearing.  HENT:     Head: Normocephalic and atraumatic.  Neck:     Vascular: No carotid bruit.  Cardiovascular:     Rate and Rhythm: Normal rate and regular rhythm.     Pulses: Normal pulses.     Heart sounds: Normal heart sounds. No murmur heard.    No friction rub. No gallop.  Pulmonary:     Effort: Pulmonary effort is normal. No respiratory distress.     Breath sounds: Normal breath sounds. No wheezing, rhonchi or rales.  Chest:     Chest wall: No tenderness.  Musculoskeletal:        General: No swelling. Normal range of motion.     Cervical back: Normal range of motion and neck supple.  Skin:    General: Skin is warm and dry.  Neurological:     General: No focal deficit present.     Mental Status: He is alert.  Gait: Gait normal.  Psychiatric:        Mood and Affect: Mood normal.        Behavior: Behavior normal.        Thought Content: Thought content normal.        Judgment: Judgment normal.     Adult ECG Report  Rate: 73 ;  Rhythm: normal sinus rhythm and Inc RBBB.  Otherwise nonspecific ST and T wave changes but stable.  Normal axis, intervals durations. ;   Narrative Interpretation: Stable  Recent Labs: Due for new lab follow-up.  Expected to have labs checked in February by PCP.  Have asked that he get TSH checked as well. Lab Results  Component Value Date   CHOL 144 06/06/2021   HDL 89 06/06/2021   LDLCALC 46 06/06/2021   TRIG 36 06/06/2021   CHOLHDL 1.6 06/06/2021   Lab Results  Component Value Date   CREATININE 0.89 06/06/2021   BUN 20 06/06/2021   NA 142 06/06/2021   K 4.8 06/06/2021   CL 103 06/06/2021   CO2 28 06/06/2021      Latest Ref  Rng & Units 11/14/2020    2:56 PM 05/17/2020   10:13 AM 07/08/2019    9:55 AM  CBC  WBC 3.8 - 10.8 Thousand/uL 6.9  6.0  3.9   Hemoglobin 13.2 - 17.1 g/dL 14.4  14.7  14.3   Hematocrit 38.5 - 50.0 % 43.4  44.7  43.5   Platelets 140 - 400 Thousand/uL 301  287  269     Lab Results  Component Value Date   HGBA1C 5.0 07/08/2019   Lab Results  Component Value Date   TSH 2.67 12/15/2017    ================================================== I spent a total of 51 minutes with the patient spent in direct patient consultation.  -- He was accompanied by his wife who had multiple questions.  We discussed coronary calcification, his Coronary CTA and his cardiac cath results.  We talked about stents atherosclerosis plaque, Plavix and blood pressure medications as well as lipid control. They also talked about how he is change his diet almost vegetarian diet.  He is increased level of activity walking maybe 3 to 4 days a week as well as doing weights.  Additional time spent with chart review  / charting (studies, outside notes, etc): 15 min Total Time: 66 min  Current medicines are reviewed at length with the patient today.  (+/- concerns) none    Notice: This dictation was prepared with Dragon dictation along with smart phrase technology. Any transcriptional errors that result from this process are unintentional and may not be corrected upon review.  Studies Ordered:   Orders Placed This Encounter  Procedures   Lipid panel   Comprehensive metabolic panel   TSH   EKG 12-Lead   No orders of the defined types were placed in this encounter.   Patient Instructions / Medication Changes & Studies & Tests Ordered   Patient Instructions  Medication Instructions:   No changes *If you need a refill on your cardiac medications before your next appointment, please call your pharmacy*   Lab Work:fASTING CMP LIPID TSH If you have labs (blood work) drawn today and your tests are completely  normal, you will receive your results only by: MyChart Message (if you have MyChart) OR A paper copy in the mail If you have any lab test that is abnormal or we need to change your treatment, we will call you to review the results.  Testing/Procedures:  Not needed  Follow-Up: At [facility], you and your health needs are our priority.  As part of our continuing mission to provide you with exceptional heart care, we have created designated Provider Care Teams.  These Care Teams include your primary Cardiologist (physician) and Advanced Practice Providers (APPs -  Physician Assistants and Nurse Practitioners) who all work together to provide you with the care you need, when you need it.     Your next appointment:   12 month(s) CALL in Aug /Sept for Jan 2025 APPT.  The format for your next appointment:   In Person  Provider:   [name], MD     [name], MD, MS [name], M.D., M.S. Interventional Cardiologist  [place]  Pager # [phone] Phone # [phone] [address] [place], [place] [address]   Thank you for choosing [facility] [place]!!

## 2022-05-22 DIAGNOSIS — Z125 Encounter for screening for malignant neoplasm of prostate: Secondary | ICD-10-CM | POA: Diagnosis not present

## 2022-05-22 DIAGNOSIS — E291 Testicular hypofunction: Secondary | ICD-10-CM | POA: Diagnosis not present

## 2022-05-28 DIAGNOSIS — E291 Testicular hypofunction: Secondary | ICD-10-CM | POA: Diagnosis not present

## 2022-06-01 ENCOUNTER — Encounter: Payer: Self-pay | Admitting: Cardiology

## 2022-06-01 NOTE — Assessment & Plan Note (Signed)
2 years out from PCI.He is doing well with no active angina.  His energy levels doing well on low-dose testosterone.   Since his last visit, his amlodipine dose has been titrated up to 10 mg daily.  I have indicated that if he has some low blood pressure readings intermittently, they can switch to 5 mg twice daily. Not on beta-blocker because of fatigue. Has been on amlodipine as opposed to ARB because of antianginal benefit. Due to statin myopathy he is on Repatha.  Doing well.  Plan follow-up labs.  We will contact him to ensure that he is actually taking his aspirin.

## 2022-06-01 NOTE — Assessment & Plan Note (Addendum)
Stable on Repatha.  He is due for labs to be checked.  Will plan to check lipid panel and chemistries and TSH. He has made dramatic changes to his diet and has increased levels exercise which is also helping his lipid control.

## 2022-06-01 NOTE — Assessment & Plan Note (Addendum)
Continues to use testosterone supplementation.  He is asking about other methods of getting testosterone rather than just to the gel.  I recommend he work with his PCP or even potentially urology./endocrinology.  Would not treat to supratherapeutic levels.

## 2022-06-01 NOTE — Assessment & Plan Note (Signed)
DES PCI of the proximal mid LAD almost 2 years ago.    Plan: No longer on Plavix but I do not see aspirin listed.  I reiterated the importance of taking 81 mg aspirin.

## 2022-06-01 NOTE — Assessment & Plan Note (Signed)
Blood pressure looks relatively controlled.  His amlodipine dose was increased up to 10 mg he has been taking it 5 mg twice daily which I think is neither he nor there whether he takes it 5 mg twice daily at 10 mg a day, which seems to confirm what he is doing.  I have previously reduced to 2.5 mg but his blood pressures apparently went up.  If his pressures increase further, would probably consider ARB as the next option. Not on beta-blocker because of fatigue.

## 2022-06-01 NOTE — Assessment & Plan Note (Addendum)
Pretty stable.  Yet again another reason for using amlodipine for blood pressure over ARB.

## 2022-06-10 ENCOUNTER — Other Ambulatory Visit (HOSPITAL_COMMUNITY): Payer: Self-pay

## 2022-06-11 ENCOUNTER — Telehealth: Payer: Self-pay | Admitting: *Deleted

## 2022-06-11 NOTE — Telephone Encounter (Signed)
Left message for patient to call   1) Question is he taking Aspirin  2) which dose of amlodipine is he taking     Will change medication list to reflect patient answer.

## 2022-06-11 NOTE — Telephone Encounter (Signed)
-----   Message from [name], MD sent at 06/01/2022  2:25 PM EST ----- Ivin Booty, I see that he did not have aspirin listed on his med list.  I wanted to make sure that he is actually taking 81 mg aspirin. He also has both a 2.5 and 10 mg amlodipine dose is listed, I want to make sure that he is still taking the 10 mg if so we can remove the 2.5 mg twice daily dosing.  [name], MD

## 2022-06-12 ENCOUNTER — Telehealth: Payer: Self-pay

## 2022-06-12 NOTE — Telephone Encounter (Signed)
Pharmacy Patient Advocate Encounter  Prior Authorization for REPATHA 140 MG/ML INJ has been approved.    Effective dates: 06/10/22 through 06/09/23   Received notification from [facility] that prior authorization for REPATHA 140 MG/ML INJ is needed.    PA submitted on 06/10/22 Key [id number] Status is pending  Karie Soda, [place] Patient Advocate Specialist Direct Number: [phone] Fax: [phone]

## 2022-06-18 MED ORDER — ASPIRIN 81 MG PO TBEC: 81.0000 mg | DELAYED_RELEASE_TABLET | Freq: Every day | ORAL | 3 refills | Status: DC

## 2022-06-18 NOTE — Addendum Note (Signed)
Addended by: Raiford Simmonds on: 06/18/2022 03:30 PM   Modules accepted: Orders

## 2022-06-18 NOTE — Telephone Encounter (Signed)
Spoke with [name] (caregiver - DPR). Explained that med list had 2 different strengths of amlodipine. Patient has 37m tablet - cuts in half. Med list updated. He plans to come in for labs tmr.

## 2022-06-18 NOTE — Telephone Encounter (Signed)
Phillip [name] returned RN's call. She reports patient is not taking aspirin and is taking 5 MG of amlodipine daily. She also reports patient will be coming in tomorrow for fasting labs. She is requesting a callback from RN to discuss other medications further. Please advise.

## 2022-06-18 NOTE — Telephone Encounter (Signed)
Called left message for [name]. Per Dr Ellyn Hack to start or restart taking Aspirin 81 mg ( enteric)  daily  Continue taking 5 mg ( 1/2 tablet of 10 mg ) dose.

## 2022-06-20 DIAGNOSIS — E785 Hyperlipidemia, unspecified: Secondary | ICD-10-CM | POA: Diagnosis not present

## 2022-06-20 DIAGNOSIS — I10 Essential (primary) hypertension: Secondary | ICD-10-CM | POA: Diagnosis not present

## 2022-06-20 DIAGNOSIS — G720 Drug-induced myopathy: Secondary | ICD-10-CM | POA: Diagnosis not present

## 2022-06-20 DIAGNOSIS — T466X5A Adverse effect of antihyperlipidemic and antiarteriosclerotic drugs, initial encounter: Secondary | ICD-10-CM | POA: Diagnosis not present

## 2022-06-20 DIAGNOSIS — I2510 Atherosclerotic heart disease of native coronary artery without angina pectoris: Secondary | ICD-10-CM | POA: Diagnosis not present

## 2022-06-21 LAB — COMPREHENSIVE METABOLIC PANEL
ALT: 18 IU/L (ref 0–44)
AST: 28 IU/L (ref 0–40)
Albumin/Globulin Ratio: 2.2 (ref 1.2–2.2)
Albumin: 4.8 g/dL (ref 3.9–4.9)
Alkaline Phosphatase: 91 IU/L (ref 44–121)
BUN/Creatinine Ratio: 13 (ref 10–24)
BUN: 12 mg/dL (ref 8–27)
Bilirubin Total: 0.8 mg/dL (ref 0.0–1.2)
CO2: 27 mmol/L (ref 20–29)
Calcium: 9.7 mg/dL (ref 8.6–10.2)
Chloride: 101 mmol/L (ref 96–106)
Creatinine, Ser: 0.96 mg/dL (ref 0.76–1.27)
Globulin, Total: 2.2 g/dL (ref 1.5–4.5)
Glucose: 100 mg/dL — ABNORMAL HIGH (ref 70–99)
Potassium: 5.3 mmol/L — ABNORMAL HIGH (ref 3.5–5.2)
Sodium: 139 mmol/L (ref 134–144)
Total Protein: 7 g/dL (ref 6.0–8.5)
eGFR: 88 mL/min/{1.73_m2} (ref >59)

## 2022-06-21 LAB — LIPID PANEL
Chol/HDL Ratio: 1.8 ratio (ref 0.0–5.0)
Cholesterol, Total: 150 mg/dL (ref 100–199)
HDL: 82 mg/dL (ref >39)
LDL Chol Calc (NIH): 56 mg/dL (ref 0–99)
Triglycerides: 61 mg/dL (ref 0–149)
VLDL Cholesterol Cal: 12 mg/dL (ref 5–40)

## 2022-06-21 LAB — TSH: TSH: 3.15 u[IU]/mL (ref 0.450–4.500)

## 2022-07-21 ENCOUNTER — Other Ambulatory Visit: Payer: Self-pay | Admitting: Cardiology

## 2022-08-06 DIAGNOSIS — F4323 Adjustment disorder with mixed anxiety and depressed mood: Secondary | ICD-10-CM | POA: Diagnosis not present

## 2022-08-22 DIAGNOSIS — E291 Testicular hypofunction: Secondary | ICD-10-CM | POA: Diagnosis not present

## 2022-08-25 ENCOUNTER — Other Ambulatory Visit: Payer: Self-pay | Admitting: Cardiology

## 2022-08-25 NOTE — Telephone Encounter (Signed)
Rx request sent to pharmacy.  

## 2022-09-24 ENCOUNTER — Encounter: Payer: Self-pay | Admitting: Cardiology

## 2022-10-28 ENCOUNTER — Telehealth: Payer: Self-pay | Admitting: *Deleted

## 2022-10-28 ENCOUNTER — Telehealth: Payer: Self-pay

## 2022-10-28 NOTE — Telephone Encounter (Signed)
Pharmacy Patient Advocate Encounter   Received notification from RN that prior authorization for REPATHA is required/requested.   PA submitted to [facility] via CoverMyMeds Key or (Medicaid) confirmation # [id number]   Status is pending

## 2022-10-28 NOTE — Telephone Encounter (Addendum)
[id number] has started a prior authorization (PA) request for a patient of Phillip [name]  Received message from [employer] (Key: [id number]) Rx #: [id number] Repatha SureClick 140MG /ML auto-injectors

## 2022-10-29 ENCOUNTER — Other Ambulatory Visit: Payer: Self-pay | Admitting: Cardiology

## 2022-11-03 ENCOUNTER — Ambulatory Visit (INDEPENDENT_AMBULATORY_CARE_PROVIDER_SITE_OTHER): Payer: Medicare Other | Admitting: Internal Medicine

## 2022-11-03 ENCOUNTER — Encounter: Payer: Self-pay | Admitting: Internal Medicine

## 2022-11-03 VITALS — BP 122/68 | HR 80 | Ht 73.0 in | Wt 176.6 lb

## 2022-11-03 DIAGNOSIS — E230 Hypopituitarism: Secondary | ICD-10-CM

## 2022-11-03 DIAGNOSIS — N5201 Erectile dysfunction due to arterial insufficiency: Secondary | ICD-10-CM | POA: Diagnosis not present

## 2022-11-03 DIAGNOSIS — G4700 Insomnia, unspecified: Secondary | ICD-10-CM | POA: Diagnosis not present

## 2022-11-03 MED ORDER — REPATHA SURECLICK 140 MG/ML ~~LOC~~ SOAJ: 140.0000 mg | SUBCUTANEOUS | 3 refills | Status: DC

## 2022-11-03 NOTE — Patient Instructions (Signed)
Consider testosterone gel again - preferably at night.  Please return to see me as needed.

## 2022-11-03 NOTE — Telephone Encounter (Signed)
Looked up key in [facility], request was approved, end date not provided but prior PA submitted in Feb with end approval date of 06/09/23. Refill sent in.

## 2022-11-03 NOTE — Progress Notes (Signed)
Patient ID: Phillip [name], male   DOB: 1956/01/22, 67 y.o.   MRN: [id number]   HPI: Phillip [name] is a 67 y.o.-year-old man, initially referred by his neurologist, Dr. Vickey Huger, whom I previously saw for hypogonadism, returning for follow-up.  Our last visit was 1.5 years ago.    At our last visit, he was following with urology for his low testosterone levels.  I advised him to continue to do so.  We did not schedule a follow-up appointment.  However, he returns to find out my opinion about best hypogonadism treatment and possible connection with insomnia.  He is here with his fiance.  Approximately 1.5 months before last visit, he contacted me with a low testosterone level obtained in the urologist office.  Upon questioning, before this level, he moved his testosterone gel from evening to morning due to insomnia: Per message from his fiancee - 07/23/2021: "After paying attention to his sleep patterns he saw that he got better sleep when he didnt apply at night. About 2 1/2 weeks ago he began applying 1 tube (50mg  in 5g of gel) every morning after a showering.   When he got his labs on back a week ago he started applying 1 1/2 tubes in morning after showering with no great results.  Today he applied 2 tubes after showering. He read it helps absorption to apply lotion on top of that...so he did that. He continues to have the hot flashes and feels his hormones are still "off".  As Dr. Retta Diones was retiring so he saw another urologist - started on testosterone 100 mg weekly.  He took 2 doses but he did not feel good on this.  Blood pressure and heart rate increased.  At this OV, he is on pellets >> HR increase, he did not feel well on this: aggressiveness, anxiety, depression. Now off (did not have another set of pellets placed)  He continues to have insomnia.  He was on gabapentin for involuntary leg movements.  He also takes medical marijuana.  Reviewed urology notes: 11/23/2017: -His ED is due to  arterial insufficiency and this is worsening.  He was given refills of generic sildenafil. 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 He had a subsequent visit with his PCP in 09/2015 >> a testosterone level was found to be extremely low.   Received records from urology >> reviewed: Patient's initial free testosterone level obtained by PCP was 0.4 pg/mL (09/17/2015). At that time, he complained of low libido, testicular atrophy, difficulty obtaining and maintaining an erection. A repeat testosterone level obtained by an integrative medicine provider in [place] (09/25/2015) showed again very low testosterone level: Total Testosterone <3 ng/dL, and the free testosterone of 0.2 ng/dL. DHEAS was 122.4 (48.9-344.2). Of note, no LH and FSH levels were drawn at that time. At that point, he was started on testosterone cypionate 8 IM 0.8 mL every other week. This has not improved his libido significantly, but it helped him obtaining morning erections.  A genital exam was performed on 11/09/2015 by his urologist: Atrophic left and right testes. No tenderness, swelling, masses, varicocele, or hydrocele. No abnormality of the penis. Circumcised status. Prostate: 2+ in size, 40  g. Normal consistency  Dr. Retta Diones obtain the following labs - 11/09/2015 - collection time: 3:34 PM  Total testosterone 1125.7 (300-890) Free testosterone 175.3 (47-244) SHBG 66.3 (10-57)  FSH 0.1, LH 0.1 Prolactin 6.3 Of note, at the time of the above collection, patient was taking 0.8 mL testosterone every other week in his deltoid. The dose was changed at that time to 0.5 mL every week in his quadriceps, after which his  fatigue improved a little, and he had more energy.   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 He saw Dr. Vickey Huger, who obtained a  brain  + pituitary MRI (10/24/2015). 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 There was no abnormality in the pituitary gland.  He admits for previous decreased libido >> normal now Had difficulty obtaining or maintaining an erection >> not anymore No trauma to testes, testicular irradiation or surgery No h/o of mumps orchitis/h/o autoimmune ds. No h/o cryptorchidism He grew and went through puberty like his peers + shrinking of testes. No very small testes (<5 ml) No incomplete/delayed sexual development     No breast discomfort/gynecomastia    No loss of body hair (axillary/pubic)/decreased need for shaving No height loss No abnormal sense of smell  + hot flushes, resolved No vision problems, other than blurry vision in R eye, also photopsia - started 11/2015 No worst HA of his life He does have a history of head trauma in 1997, when a tree fell on top of his head and he lost consciousness for more than 30 minutes; in 2009 he fell off ladder and landed on back of his head No FH of hypogonadism/infertility No personal h/o infertility - has 2 children: 9 and 95 years old  No FH of hemochromatosis or pituitary tumors No excessive weight gain or loss.  No chronic diseases, but recently diagnosed with RMSF Spring 2017 No chronic pain. Not on opiates, does not take steroids, had a course this spring - ~1 week He was drinking Alcohol: ~2 drinks a day up to 2015 >>  stopped completely since then No anabolic steroids use No herbal medicines. Not on antidepressants  No AI ds in his family, no FH of MS. He does not have family history of early cardiac disease.  Reviewed  pertinent labs:   07/19/2021: Total testosterone 96 (264-916), estradiol <5 -upon questioning, he had moved his testosterone gel from evening to morning before this result.  I advised him to move it back tonight  Component     Latest Ref Rng & Units 03/26/2021  Testosterone, Serum (Total)     ng/dL 1,610 (H)  % Free Testosterone     % 0.7  Free Testosterone, S     pg/mL 81  Sex Hormone Binding Globulin     nmol/L 98.1 (H)   03/13/2021: Total testosterone 207.2 (300-190), free testosterone 29 (47-244) (calculated 28), SHBG 53 (10-57)  Initially after switching to the testosterone gel: Component     Latest Ref Rng & Units 07/02/2020  Testosterone, Serum (Total)     ng/dL 960  % Free Testosterone     % 0.8  Free Testosterone, S     pg/mL 58  Sex Hormone Binding Globulin     nmol/L 65.9   Component     Latest Ref Rng & Units 02/10/2020 04/12/2020  Testosterone, Serum (Total)  ng/dL 638 756 (H)  % Free Testosterone     % 0.6 0.4  Free Testosterone, S     pg/mL 45 (L) 39 (L)  Sex Hormone Binding Globulin     nmol/L 78.1 (H) 95.7 (H)   Component     Latest Ref Rng & Units 10/25/2018  Testosterone, Serum (Total)     ng/dL 433  % Free Testosterone     % 1.3  Free Testosterone, S     pg/mL 106  Sex Hormone Binding Globulin     nmol/L 59.2  Estradiol, Free     pg/mL 0.46 (H)  Estradiol     ADULTS: < OR = 0.45 pg/mL 22  PSA     0.10 - 4.00 ng/mL 0.51   Component     Latest Ref Rng & Units 07/20/2017 10/12/2017 12/22/2017  Testosterone, Serum (Total)     ng/dL  295   % Free Testosterone     %  0.8   Free Testosterone, S     pg/mL  57   Sex Hormone Binding Globulin     nmol/L  83.5 (H)   Testosterone     264 - 916 ng/dL 188    Testosterone Free     7.2 - 24.0 pg/mL 4.1 (L)    Sex Horm Binding Glob, Serum     19.3 - 76.4 nmol/L 100.7 (H)    Estradiol, Free     pg/mL   0.38  Estradiol     pg/mL   19   Initially: Component     Latest Ref Rng & Units 01/28/2016   Testosterone     264 - 916 ng/dL 4,166 (H)  Testosterone Free     7.2 - 24.0 pg/mL 15.1  Sex Horm Binding Glob, Serum     19.3 - 76.4 nmol/L 80.8 (H)  TSH     0.35 - 4.50 uIU/mL 1.90  Triiodothyronine,Free,Serum     2.3 - 4.2 pg/mL 3.9  T4,Free(Direct)     0.60 - 1.60 ng/dL 0.63  FSH     1.4 - 01.6 mIU/ML 0.1 (L)  Cortisol, Plasma     ug/dL 01.0  LH     9.32 - 3.55 mIU/mL 0.03 (L)  hCG Quant     0 - 3 mIU/mL <1   Component     Latest Ref Rng & Units 01/28/2016  IGF-I, LC/MS     50 - 317 ng/mL 140  Z-Score (Male)     -2.0 - 2.0 SD 0.1  Estradiol, Free     ADULTS: < OR = 0.45 pg/mL 1.06 (H)  Estradiol     ADULTS: < OR = 29 pg/mL 53 (H)  Prolactin     2.0 - 18.0 ng/mL 8.1   PSA levels were normal: 07/19/2021: PSA 0.5-urology office 03/13/2021: PSA 0.69-urology office Lab Results  Component Value Date   PSA 0.51 02/10/2020   PSA 0.51 10/25/2018   PSA 0.80 12/22/2017   PSA 0.59 12/19/2016   PSA 0.72 12/10/2016   PSA 0.57 04/12/2014   PSA 0.57 03/16/2013   His Hb/hematocrit were normal: 07/19/2021: 13.9/42.3 Lab Results  Component Value Date   HGB 14.4 11/14/2020   HGB 14.7 05/17/2020   HGB 14.3 07/08/2019   HGB 14.5 06/08/2019   HGB 14.8 07/01/2018   HGB 14.2 07/01/2018   HGB 14.7 06/28/2018   HGB 14.7 12/15/2017   HGB 14.6 12/19/2016   HGB 14.5 12/10/2016   Lab Results  Component Value Date  HCT 43.4 11/14/2020   Patient has a history of a coronary stent placed in 06/2018 - after he had an elevated CAC score, after which he had to come off testosterone transiently.  With cardiology consent, we restarted testosterone supplement at 0.3 mL weekly as he felt poorly off testosterone. He was in the emergency room 06/2019 atypical chest pain.  He has a history of statin myopathy so he was started on Repatha 07/2019. He takes Ambien to help him sleep. He started on the [place] diet (06/2019): Diet, exercise, stress management.  He joined the program in  [place], initially commuting there, but afterwards going only sporadically.  He was then not going in person but trying to follow the diet at home.  He reads labels and tries to stay on a low-fat, low-salt, low sugar diet.  ROS:   + fatigue, + insomnia (periodic limb movement) + see HPI  I reviewed pt's medications, allergies, PMH, social hx, family hx, and changes were documented in the history of present illness. Otherwise, unchanged from my initial visit note.  Past Medical History:  Diagnosis Date   ADD (attention deficit disorder)    on adderrall managed by neurology   Anxiety    on ativan managed by Neurology   Atrophy, 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 Synergy DES 2.75 x 38 -- 3.0 mm).  mCx ~60% - med Rx, mRCA 30% & 40%.  ER 55-60%.   Cellulitis    Chronic traumatic encephalopathy    right frontal-temproal lobe   Complication of anesthesia    " BRAIN FOG "   Depression    Emphysema of lung (HCC)    Erectile dysfunction 11/2016   follows with urology; sildenafil prescribed   Hx of adenomatous colonic polyps 07/2014   2 diminutive adenomas   Hyperlipidemia    Hypertension    Meningoencephalitis 1994   [place]   Pneumonia    Primary hypogonadism in male    Raynaud's disease 09/13/2015   As complication of RMSF   Restless legs 09/13/2015   [facility] spotted fever 2017   TBI (traumatic brain injury) (HCC) 1997, 2009   "concussions"   Vertebral artery stenosis, asymptomatic, right    Likely atretic; large dominant left vertebral artery noted.   Past Surgical History:  Procedure Laterality Date   COLONOSCOPY  07/2014   2 diminutive adenomas   CORONARY STENT INTERVENTION N/A 06/30/2018   Procedure: CORONARY STENT INTERVENTION;  Surgeon: Marykay Lex,  MD;  Location: [facility];;  p-mLAD2 80% & mLAD 70% (tandem lesions) - DES PCI Synergy DES 2.75 x 38 (3.0 mm).   CT CTA CORONARY W/CA SCORE W/CM &/OR WO/CM  06/2018   Cardiac cath score read 1318.  Moderate coronary disease sent for FFR: mRCA 0.84 (non-significant), mLAD 0.64 (signifiant), pLOM(Cx) - 0,72 (significant - but distal lesion)   LEFT HEART CATH AND CORONARY ANGIOGRAPHY N/A 06/30/2018   Procedure: LEFT HEART CATH AND CORONARY ANGIOGRAPHY;  Surgeon: Marykay Lex, MD;  Location: [facility] INVASIVE CV LAB;;  pLAD1 55% (not significant), p-mLAD2 80% & mLAD 70% (tandem lesions -> DES PCI). mCx ~60% (med Rx), mRCA 30% & 40%.  ER 55-60%.    TEE WITHOUT CARDIOVERSION N/A 09/06/2015   Procedure: TRANSESOPHAGEAL ECHOCARDIOGRAM (TEE);  Surgeon: Jake Bathe, MD;  R/O endocarditis;   TONSILLECTOMY  1965   Social History   Social History   Marital status: Single    Spouse name: N/A   Number of children: 2   Occupational History   n/a   Social History Main Topics   Smoking status: Former Smoker    Packs/day: 1.50    Years: 25.00    Types: Cigarettes    Quit date: 2002   Smokeless tobacco: Never Used     Comment: Encouraged to remain smoke free   Alcohol use No     Comment: 8 drinks   Drug use: No   Social History Narrative   Divorced. Education: [employer].   Current Outpatient Medications on File Prior to Visit  Medication Sig Dispense Refill   Alpha-Lipoic Acid 300 MG CAPS Take by mouth.     amLODipine (NORVASC) 10 MG tablet TAKE 1 TABLET BY MOUTH EVERY DAY 90 tablet 1   amphetamine-dextroamphetamine (ADDERALL) 30 MG tablet Take 1-2 tablets by mouth daily. 60 tablet 0   aspirin EC 81 MG tablet Take 1 tablet (81 mg total) by mouth daily. Swallow whole. 90 tablet 3   b complex vitamins capsule Take 1 tablet by mouth daily.     Cholecalciferol 125 MCG (5000 UT) TABS      Coenzyme Q10 100 MG capsule Take by mouth.     Evolocumab (REPATHA SURECLICK) 140 MG/ML SOAJ Inject 140  mg into the skin every 14 (fourteen) days. 6 mL 3   Iron-Vitamin C (VITRON-C) 65-125 MG TABS Take by mouth.     magnesium oxide (MAG-OX) 400 MG tablet Take by mouth.     Menaquinone-7 (VITAMIN K2) 100 MCG CAPS      Misc Natural Products (PROSTATE SUPPORT PO) Take 1 capsule by mouth every evening. Prostate Plus     nitroGLYCERIN (NITROSTAT) 0.4 MG SL tablet PLACE 1 TABLET UNDER TONGUE EVERY 5 MINS AS NEEDED FOR CHEST PAIN 75 tablet 2   Omega-3 Fatty Acids (SUPER OMEGA 3 EPA/DHA PO) Take 1 capsule by mouth daily.     prednisoLONE acetate (PRED FORTE) 1 % ophthalmic suspension Place 1 drop into both eyes 4 (four) times daily as needed. Stop after 3-5 days. 5 mL 0   testosterone (ANDROGEL) 50 MG/5GM (1%) GEL SMARTSIG:50 Milligram(s) Topical Daily     Theanine 50 MG TBDP Take by mouth.     vitamin B-12 (CYANOCOBALAMIN) 500 MCG tablet Take by mouth.     vitamin E 180 MG (400 UNITS) capsule Take by mouth.     zinc gluconate 50 MG tablet Take by mouth.     No current facility-administered medications on file prior to visit.   Allergies  Allergen Reactions   Penicillins Hives    Did it involve swelling of the face/tongue/throat, SOB, or low BP? Unknown Did it involve sudden or severe rash/hives, skin peeling, or any reaction on the inside of your mouth or nose? Yes Did you need to seek medical attention at a hospital or doctor's office? Unknown When did it last happen? Occurred at age 44 or 8 If all above answers are "NO", may proceed with cephalosporin use.    Statins Other (See Comments)    confusion   Methylprednisolone Other (See Comments)    Unsure of exact reaction type   Penicillin G Other (See Comments)   Amphetamines Other (See Comments)    Intolerant to specific formulations: [employer] amphetamine TEVA dextroamp amphetamine   Lorazepam Other (See  Comments)    Mylan lorazepam; others ok   Propofol Other (See Comments)    Cognitive delay and emotional   Family History  Problem  Relation Age of Onset   Mental illness Mother    Colon polyps Father    Hyperlipidemia Father    Mental illness Brother    Heart disease Maternal Grandfather 59   Cancer Paternal Grandfather    Colon cancer Neg Hx    Esophageal cancer Neg Hx    Stomach cancer Neg Hx    Rectal cancer Neg Hx    PE: BP 122/68   Pulse 80   Ht 6\' 1"  (1.854 m)   Wt 176 lb 9.6 oz (80.1 kg)   SpO2 99%   BMI 23.30 kg/m  Wt Readings from Last 3 Encounters:  11/03/22 176 lb 9.6 oz (80.1 kg)  05/21/22 188 lb 3.2 oz (85.4 kg)  09/06/21 183 lb 12.8 oz (83.4 kg)   Constitutional: overweight, in NAD Eyes:  EOMI, no exophthalmos ENT: no neck masses, no cervical lymphadenopathy Cardiovascular: RRR, No MRG Respiratory: CTA B Musculoskeletal: no deformities Skin:no rashes Neurological: no tremor with outstretched hands  ASSESSMENT: 1.  Hypogonadotropic hypogonadism - possible reasons for his very low initial testosterone: 1. Labs were drawn soon after diagnosis of RMSF, which, for him, was a fulminant, systemic, disease. Usually, the pituitary gland will shunt the production of testosterone or other hormones deemed "unnecessary" during acute illness. Testosterone levels could be quite low in this situation. 2. Labs were drawn also after a prednisone course, but I do not have a clear time sequence 3. He has a history of head trauma x2, which could have caused pituitary insufficiency 4. He has a history of alcohol use, however, he relates that he stopped in 2015  -Reviewed visit note from Dr. Retta Diones with [facility] from 11/23/2017: -His ED is due to arterial insufficiency and this is worsening.  He was given refills of generic sildenafil. -Was advised to continue 0.3 cc of testosterone cypionate weekly (200 mg / 1 mL) -On genital exam, he had mildly atrophic right and left testicles and a prostate of approximately 30 g,  without abnormalities  2. ED  3.  Insomnia  PLAN:  1.  Hypogonadism -Patient with history of hypogonadotrophic hypogonadism, diagnosed approximately 8 years ago, during an episode of RMSF, during which his testosterone was checked and he was in the castrate range.  He was started on testosterone afterwards and referred to to me for further investigation.  Pituitary hormones and pituitary MRI were normal.  LH and FSH were low, as expected on testosterone treatment. His hypothalamic-pituitary-testicular axis improved afterwards, but he did not recover completely so he was started on testosterone, initially 0.3 mL weekly, which we tried to decrease to 0.2 mL weekly but he could not tolerate it due to increased fatigue.  Also, he did not feel well during peaks and troughs given by the testosterone injection so we discussed about possibly switching to testosterone gel, which he started at 5 g daily.  Testosterone in 06/2020 was normal.  However, he had a low testosterone in 03/2021 in the urologist office, most likely due to missed doses.  We repeated a level later in 03/2021 and a free testosterone level was normal.  We did not change his regimen. -He contacted me again in 07/2021 due to a very low total testosterone level, at 96, but upon questioning, before this level he moved his testosterone gel in the morning due to insomnia.  We did discuss that this was most likely the cause for the low testosterone.  I advised him to either move the testosterone back at night or to discuss with urology about other options for testosterone replacement.  Reviewing records from then, he was started on testosterone enanthate (Xyosted) injected at 100 mg weekly.  However, he developed high blood pressure and tachycardia on it and stopped after 2 doses. -Later, he tried testosterone pellets, which she recently stopped because she did not like how he felt during the peaks and troughs, including hot flashes, anxiety, depression, irritability -It is important to note that he has a  history of coronary artery disease. He had an ED visit in 06/2019 for chest pain, but MI was ruled out.  He was started on PCSK9 inhibitor.  Of note, he also had a coronary stent placed 06/2018.  He was initially off testosterone afterwards, but we restarted testosterone in 08/2018 per Dr. Elissa Hefty consent.  He did not have chest pain or shortness of breath.   -We discussed at last visit and again today, that probably the best regimen for him is a formulation of testosterone that does not cause large fluctuations in blood sugars or heart rate.  Along these lines, p.o. testosterone (Jatenzo) would likely not be a good option for him due to the risk of high blood pressure.  Intranasal testosterone is taken 3 times a day, an intense regimen, but at today's visit I again recommended to give the testosterone gel to try.  He agrees to retry this.  We discussed that the best time to take it is at night, but if he feels that this causes insomnia, he can take it in the morning. -He had a slightly elevated estrogen in the past most likely due to testosterone treatment.  However, at last check, the estradiol level was undetectable -CBC and PSA were normal in 07/2021.   -No follow-up with me needed; he will continue to see urology for this problem  2. ED -With vascular origin per notes reviewed from Dr. Retta Diones -He is on PDE 5 inhibitor by urology.  3.  Insomnia -We discussed that this is less likely related to his testosterone treatment or lack of treatment -Discussed about different options -he tried hypnosis in the past, but only 1 session.  He feels that this helps and we discussed about trying this again in a more consistent fashion.  He would very much like to return to  this.  Carlus Pavlov, MD PhD [facility]

## 2022-11-04 ENCOUNTER — Other Ambulatory Visit (HOSPITAL_COMMUNITY): Payer: Self-pay

## 2022-11-04 NOTE — Telephone Encounter (Addendum)
Per pts plan P/A has been Approved until 4.4.24 to 4.4.25 (Must get month to month supply) Also under key [facility]  Plan # [phone]

## 2022-11-20 DIAGNOSIS — N5201 Erectile dysfunction due to arterial insufficiency: Secondary | ICD-10-CM | POA: Diagnosis not present

## 2022-11-20 DIAGNOSIS — R948 Abnormal results of function studies of other organs and systems: Secondary | ICD-10-CM | POA: Diagnosis not present

## 2022-11-20 DIAGNOSIS — E291 Testicular hypofunction: Secondary | ICD-10-CM | POA: Diagnosis not present

## 2022-11-21 ENCOUNTER — Other Ambulatory Visit (HOSPITAL_COMMUNITY): Payer: Self-pay

## 2022-11-21 ENCOUNTER — Telehealth: Payer: Self-pay | Admitting: Cardiology

## 2022-11-21 NOTE — Telephone Encounter (Signed)
We already have an approval on file through next year for Repatha, not sure if anything is needed with this?

## 2022-11-21 NOTE — Telephone Encounter (Signed)
Megan from [facility] called about the patient's PA for Repatha. Aundra Millet stated that the PA was denied because they did not receive clinical information from [place]. Aundra Millet stated that she will fax over the information that we need, but we can appeal the PA over the phone or by fax. Aundra Millet gave a phone number of [phone] opt 5, fax number [phone], and the case number is [id number]. Please advise.

## 2022-11-24 ENCOUNTER — Other Ambulatory Visit (HOSPITAL_COMMUNITY): Payer: Self-pay

## 2022-11-24 NOTE — Telephone Encounter (Signed)
Pharmacy Patient Advocate Encounter   Received notification from Pt Calls Messages that prior authorization for Repatha Sureclick 140 mg/ml Soaj is required/requested.   Insurance verification completed.   The patient is insured through [facility] Medicare Part D .   Per test claim: The current 30 day co-pay is, $45.00.  No PA needed at this time.

## 2022-12-03 ENCOUNTER — Telehealth: Payer: Self-pay

## 2022-12-03 ENCOUNTER — Other Ambulatory Visit (HOSPITAL_COMMUNITY): Payer: Self-pay

## 2022-12-03 NOTE — Telephone Encounter (Signed)
Pharmacy Patient Advocate Encounter   Received notification from Physician's Office that prior authorization for REPATHA is required/requested.   Insurance verification completed.   The patient is insured through [employer]  .   Per test claim: PA required; PA submitted to [id number] via CoverMyMeds Key/confirmation #/EOC [id number] Status is pending

## 2022-12-03 NOTE — Telephone Encounter (Signed)
Also left detailed message for pt to see if his pharmacy can run the rx, that's the easiest way to tell if med is processing. If he's having issues with that, advised him to let us know. Everything looks fine from our end though.

## 2022-12-03 NOTE — Telephone Encounter (Signed)
I'm getting a failed test claim now. It was likely paying a transition fill when someone checked on 11/24/22. Pt has [employer] Medicare as of 11/03/22. Submitting new PA.

## 2022-12-03 NOTE — Telephone Encounter (Signed)
Patient's friend Clydie Braun is calling because the patient's PA for the Repatha was denied. Clydie Braun states there is confusion about the PA because she was told it was approved, but received a letter that was dated on 07/19 saying it was denied. Clydie Braun stated she spoke with [employer] on Monday 12/01/22 and they are stating that they still need the PA to be approved. Please advise.

## 2022-12-08 ENCOUNTER — Other Ambulatory Visit (HOSPITAL_COMMUNITY): Payer: Self-pay

## 2022-12-08 NOTE — Telephone Encounter (Signed)
Error with request in CMM resubmitted through [employer].

## 2022-12-08 NOTE — Telephone Encounter (Addendum)
Not sure either. Appeal info sent.

## 2022-12-08 NOTE — Telephone Encounter (Signed)
Pharmacy Patient Advocate Encounter  Received notification from [facility]  that Prior Authorization for REPATHA has been DENIED. Please advise how you'd like to proceed. Full denial letter will be uploaded to the media tab. See denial reason below.  Submitted using ICD I25.10 and chart notes from 05/21/22 visit

## 2022-12-08 NOTE — Telephone Encounter (Signed)
Pt has ASCVD - history of CAD with revascularization (PCI). He is intolerant to pravastatin and rosuvastatin (myalgias) per prior dispense report. It is being prescribed by cardiology. He has shown great response to the medication - baseline LDL 133, most recently 56 on 06/20/22.   He meets all of the coverage criteria, I cannot tell why it was denied.

## 2022-12-08 NOTE — Telephone Encounter (Signed)
Separate encounter with PA info - PA was denied, unclear why as he meets all coverage criteria, plus he had an approval on file earlier this year through next year. Called pt and left detailed message that coverage is in process again and I am unsure why his insurance is being so difficult. Advised him that we have samples in the office if needed and to let us know if he'd like to stop by to pick one up.

## 2022-12-09 NOTE — Telephone Encounter (Signed)
Robretia from [place] called and mentioned that she received the appeal for the patient's Repatha. She will be faxing a form that needs to be filled out and sent back to her. The fax number is [phone], ATTN: Robretia. She also provided her direct line: [phone].

## 2022-12-16 ENCOUNTER — Other Ambulatory Visit (HOSPITAL_COMMUNITY): Payer: Self-pay

## 2022-12-16 MED ORDER — REPATHA SURECLICK 140 MG/ML ~~LOC~~ SOAJ: 140.0000 mg | SUBCUTANEOUS | 3 refills | Status: AC

## 2022-12-16 NOTE — Telephone Encounter (Signed)
Refill sent in

## 2022-12-16 NOTE — Telephone Encounter (Signed)
Pharmacy Patient Advocate Encounter  Received notification from [facility]  that Prior Authorization for REPATHA has been APPROVED from 11/19/22 to 12/15/23. Ran test claim, Copay is $135 FOR 3 MONTH SUPPLY. This test claim was processed through [facility]- copay amounts may vary at other pharmacies due to pharmacy/plan contracts, or as the patient moves through the different stages of their insurance plan.

## 2022-12-16 NOTE — Addendum Note (Signed)
Addended by: Lurene Robley E on: 12/16/2022 09:28 AM   Modules accepted: Orders

## 2023-03-25 DIAGNOSIS — R948 Abnormal results of function studies of other organs and systems: Secondary | ICD-10-CM | POA: Diagnosis not present

## 2023-03-25 DIAGNOSIS — E291 Testicular hypofunction: Secondary | ICD-10-CM | POA: Diagnosis not present

## 2023-04-06 DIAGNOSIS — N5201 Erectile dysfunction due to arterial insufficiency: Secondary | ICD-10-CM | POA: Diagnosis not present

## 2023-04-06 DIAGNOSIS — E291 Testicular hypofunction: Secondary | ICD-10-CM | POA: Diagnosis not present

## 2023-05-22 ENCOUNTER — Encounter: Payer: Self-pay | Admitting: Cardiology

## 2023-05-22 ENCOUNTER — Ambulatory Visit: Payer: Medicaid Other | Attending: Cardiology | Admitting: Cardiology

## 2023-05-22 VITALS — BP 116/64 | HR 66 | Ht 73.0 in | Wt 183.0 lb

## 2023-05-22 DIAGNOSIS — I10 Essential (primary) hypertension: Secondary | ICD-10-CM

## 2023-05-22 DIAGNOSIS — E785 Hyperlipidemia, unspecified: Secondary | ICD-10-CM

## 2023-05-22 DIAGNOSIS — I2510 Atherosclerotic heart disease of native coronary artery without angina pectoris: Secondary | ICD-10-CM

## 2023-05-22 DIAGNOSIS — E230 Hypopituitarism: Secondary | ICD-10-CM

## 2023-05-22 DIAGNOSIS — G720 Drug-induced myopathy: Secondary | ICD-10-CM | POA: Diagnosis not present

## 2023-05-22 DIAGNOSIS — Z9861 Coronary angioplasty status: Secondary | ICD-10-CM

## 2023-05-22 DIAGNOSIS — I7300 Raynaud's syndrome without gangrene: Secondary | ICD-10-CM

## 2023-05-22 DIAGNOSIS — T466X5D Adverse effect of antihyperlipidemic and antiarteriosclerotic drugs, subsequent encounter: Secondary | ICD-10-CM

## 2023-05-22 NOTE — Progress Notes (Unsigned)
Cardiology Office Note:  .   Date:  05/25/2023  ID:  Phillip [name], DOB April 16, 1956, MRN [id number] PCP: Phillip Mesi, MD  [facility] Providers Cardiologist:  Phillip Lemma, MD     Chief Complaint  Patient presents with   Follow-up    Annual follow-up.  Doing well.   Coronary Artery Disease    No angina.    Patient Profile: Phillip [name] Kitchen     Phillip [name] is a  68 y.o. male with a CAD-PCI (based on elevated coronary calcium score based on atypical symptoms), HTN and HLD as well as Raynaud's who presents here for annual follow-up at the request of Hilts, Casimiro Needle, MD.  CAD -February 2020: referred for-Coronary Calcium Score abnormal-Coronary CTA with LAD disease  => cath with LAD stenosis-PCI  Myoview 07/11/2020: Exercised ~14 min.  15.7 METS.  EF 50 to 55%.  No ischemia infarction.  He did report some chest tightness. CRF's: HLD with statin myopathy HTN  Raynaud's ADD Hypogonadism Chronic traumatic encephalopathy    [name] was last seen in January 2024 -> negative CV ROS.  Only some mild aches and pains and stable headaches.  Mild memory loss.  Lipids chemistry and TSH ordered, no med changes.  Back in May 2024 he was started on testosterone, and did note some increased blood pressures when he initially started.  Subjective  Discussed the use of AI scribe software for clinical note transcription with the patient, who gave verbal consent to proceed.  History of Present Illness   The patient, with a history of cardiovascular disease and a stent placement in the left anterior descending artery (LAD) in 2020, presented for a routine follow-up. The patient reported no recent cardiac symptoms such as chest pain or shortness of breath. The patient's cardiovascular risk was initially identified through a coronary calcium score, which led to further investigations and subsequent stent placement.  The patient has made significant lifestyle changes, including adopting a plant-based  diet and increasing physical activity levels, which have contributed to an overall improvement in health. The patient reported no issues with the current medication regimen, which includes amlodipine and Repatha.    The patient also has a history of low testosterone levels and is on testosterone gel replacement therapy. The patient reported no adverse effects from this therapy and has noticed an improvement in overall activity levels and mood since starting the treatment.  His Energy Level has been much better. He and his wife were skiing in the [facility] yesterday, and he denied any issues of dyspnea, fatigue, chest discomfort or myalgias.    See Negative CVROS  The patient had a previous episode of [facility] spotted fever, which was associated with shortness of breath. However, the patient reported no residual symptoms from this illness.   The patient's overall health has improved significantly since the initial consultation, with increased physical activity, including skiing, and a healthier diet. The patient expressed a desire to understand the potential for reducing or discontinuing certain medications in the future, particularly Repatha, based on continued lifestyle modifications and lab results.     Cardiovascular ROS: no chest pain or dyspnea on exertion negative for - edema, irregular heartbeat, orthopnea, palpitations, paroxysmal nocturnal dyspnea, rapid heart rate, shortness of breath, or lightheadedness, dizziness or wooziness, syncope or near syncope, TIA or amaurosis fugax, melena, hematochezia, hematuria or epistaxis.  ROS:  Review of Systems - Negative except symptoms noted above.  Energy level much improved with testosterone supplementation.  Objective   Studies Reviewed: Phillip [name] Kitchen   EKG Interpretation Date/Time:  Friday May 22 2023 10:32:50 EST Ventricular Rate:  66 PR Interval:  180 QRS Duration:  98 QT Interval:  374 QTC Calculation: 392 R Axis:   52  Text  Interpretation: Normal sinus rhythm Incomplete right bundle branch block Borderline criteria for Septal infarct (cited on or before 08-Jun-2019) When compared with ECG of 08-Jun-2019 18:17, No significant change was found Confirmed by Phillip [name] ([id number]) on 05/25/2023 5:33:12 PM   No new studies Cardiac Cath (06/30/2018): Prox to mid LAD 80% stenosis and mid LAD 70% => both lesions covered with Synergy XD DES 2.75 mm x 38 mm stent deployed to 3.0 mm.  Proximal LAD 55% prior to D1, D1 40%, mid LCx 60%, mid RCA 30% and 40% stenoses.  Normal EF 55 to 65%.  Labs dated 06/20/2022: TC 150, TG 61, HDL 82, LDL 56; Cr 0.96, K+ 5.3; TSH 3.15 -> Due for lab recheck  Risk Assessment/Calculations:         Physical Exam:   VS:  BP 116/64   Pulse 66   Ht 6\' 1"  (1.854 m)   Wt 183 lb (83 kg)   SpO2 97%   BMI 24.14 kg/m    Wt Readings from Last 3 Encounters:  05/22/23 183 lb (83 kg)  11/03/22 176 lb 9.6 oz (80.1 kg)  05/21/22 188 lb 3.2 oz (85.4 kg)    GEN: Well nourished, well groomed in no acute distress; Healthy-Appearing NECK: No JVD; No carotid bruits CARDIAC: Normal S1, S2; RRR, no murmurs, rubs, gallops RESPIRATORY:  Clear to auscultation without rales, wheezing or rhonchi ; nonlabored, good air movement. ABDOMEN: Soft, non-tender, non-distended EXTREMITIES:  No edema; No deformity     ASSESSMENT AND PLAN: .    Problem List Items Addressed This Visit       Cardiology Problems   CAD S/P percutaneous coronary angioplasty (Chronic)   4 years out from PCI. No longer on Plavix, and not taking aspirin routinely. -Resume aspirin 81mg , aiming for at least 5 days per week. If missed for more than a week, take 325mg  for one day then resume 81mg  daily. -Okay to hold aspirin 5 to 7 days preop for surgeries or procedures.      Relevant Orders   EKG 12-Lead (Completed)   Coronary artery disease involving native coronary artery of native heart without angina pectoris (Chronic)   History of LAD  stent placement in 2020 for 80% stenosis. No current symptoms of chest pain or shortness of breath.  Currently maintained on amlodipine 10 mg daily, Repatha, omega-3 fatty acids and intermittently taking aspirin  Amlodipine chosen for antianginal benefit.  (Chosen over an ARB) No beta-blocker because of issues of fatigue.  Patient had discontinued aspirin. Discussed benefits of antiplatelet therapy for cardiovascular protection post-stent placement. -Resume aspirin 81mg       Relevant Orders   EKG 12-Lead (Completed)   Lipid panel   Comprehensive metabolic panel   Lipid panel   Comprehensive metabolic panel   Essential hypertension - Primary (Chronic)   BP well-controlled on current dose of amlodipine at 10 mg daily. We decided to use amlodipine over an ARB for antianginal and Raynaud's benefit, and have not started an ARB as his BP has been stable. No beta-blocker because of fatigue. -Continue current dose of amlodipine 10 mg daily      Relevant Orders   EKG 12-Lead (Completed)   Hyperlipidemia with target low density lipoprotein (LDL) cholesterol  less than 55 mg/dL (Chronic)   LDL slightly elevated at 56 (goal <55). Patient has made lifestyle modifications including plant-based diet and increased exercise. Discussed potential for reducing Repatha dose in the future based on LDL trends and patient's lifestyle modifications. -Continue current regimen of Repatha. -Check lipid panel in the next 1-2 weeks and again prior to annual visit next year.      Raynaud's disease (Chronic)   Additional reason for using amlodipine        Other   Hypogonadotropic hypogonadism (HCC) (Chronic)   Patient on testosterone gel for hypogonadism. No adverse effects reported. Discussed benefits of maintaining normal testosterone levels for activity and overall well-being. -Continue testosterone gel.      Statin myopathy (Chronic)   Tried multiple different statins all with significant  myopathy. Currently on Repatha and tolerating well. -Continue Repatha and omega-3 fatty acids along with co-Q10      Relevant Orders   Lipid panel   Comprehensive metabolic panel   Lipid panel   Comprehensive metabolic panel     General Health Maintenance -Order chemistry and TSH along with lipid panel in the next 1-2 weeks and again prior to annual visit next year. -Follow-up in one year or sooner if any new symptoms arise.   Return in about 1 year (around 05/21/2024) for Routine follow up with me.     Signed, Marykay Lex, MD, MS Phillip [name], M.D., M.S. Interventional Cardiologist  [facility]  Pager # [phone] Phone # [phone] [address] [place], [place] [address]

## 2023-05-22 NOTE — Patient Instructions (Addendum)
Medication Instructions:   No changes  *If you need a refill on your cardiac medications before your next appointment, please call your pharmacy*   Lab Work: Fasting  Lipid CMP- next week    Fasting LIPID,CMP  prior in 12 mont appt If you have labs (blood work) drawn today and your tests are completely normal, you will receive your results only by: MyChart Message (if you have MyChart) OR A paper copy in the mail If you have any lab test that is abnormal or we need to change your treatment, we will call you to review the results.   Testing/Procedures:  Not needed  Follow-Up: At [facility], you and your health needs are our priority.  As part of our continuing mission to provide you with exceptional heart care, we have created designated Provider Care Teams.  These Care Teams include your primary Cardiologist (physician) and Advanced Practice Providers (APPs -  Physician Assistants and Nurse Practitioners) who all work together to provide you with the care you need, when you need it.     Your next appointment:   12 month(s)  The format for your next appointment:   In Person  Provider:   Bryan Lemma, MD

## 2023-05-25 ENCOUNTER — Encounter: Payer: Self-pay | Admitting: Cardiology

## 2023-05-25 NOTE — Assessment & Plan Note (Addendum)
History of LAD stent placement in 2020 for 80% stenosis. No current symptoms of chest pain or shortness of breath.  Currently maintained on amlodipine 10 mg daily, Repatha, omega-3 fatty acids and intermittently taking aspirin  Amlodipine chosen for antianginal benefit.  (Chosen over an ARB) No beta-blocker because of issues of fatigue.  Patient had discontinued aspirin. Discussed benefits of antiplatelet therapy for cardiovascular protection post-stent placement. -Resume aspirin 81mg 

## 2023-05-25 NOTE — Assessment & Plan Note (Signed)
LDL slightly elevated at 56 (goal <55). Patient has made lifestyle modifications including plant-based diet and increased exercise. Discussed potential for reducing Repatha dose in the future based on LDL trends and patient's lifestyle modifications. -Continue current regimen of Repatha. -Check lipid panel in the next 1-2 weeks and again prior to annual visit next year.

## 2023-05-25 NOTE — Assessment & Plan Note (Addendum)
4 years out from PCI. No longer on Plavix, and not taking aspirin routinely. -Resume aspirin 81mg , aiming for at least 5 days per week. If missed for more than a week, take 325mg  for one day then resume 81mg  daily. -Okay to hold aspirin 5 to 7 days preop for surgeries or procedures.

## 2023-05-25 NOTE — Assessment & Plan Note (Signed)
Patient on testosterone gel for hypogonadism. No adverse effects reported. Discussed benefits of maintaining normal testosterone levels for activity and overall well-being. -Continue testosterone gel.

## 2023-05-25 NOTE — Assessment & Plan Note (Signed)
Additional reason for using amlodipine

## 2023-05-25 NOTE — Assessment & Plan Note (Signed)
Tried multiple different statins all with significant myopathy. Currently on Repatha and tolerating well. -Continue Repatha and omega-3 fatty acids along with co-Q10

## 2023-05-25 NOTE — Assessment & Plan Note (Addendum)
BP well-controlled on current dose of amlodipine at 10 mg daily. We decided to use amlodipine over an ARB for antianginal and Raynaud's benefit, and have not started an ARB as his BP has been stable. No beta-blocker because of fatigue. -Continue current dose of amlodipine 10 mg daily

## 2023-06-19 ENCOUNTER — Encounter: Payer: Self-pay | Admitting: Cardiology

## 2023-06-22 NOTE — Telephone Encounter (Signed)
Overall, labs look pretty good.  I think were fine going to Repatha every 3 weeks and we can reassess and see how that works in about 6 months. Blood glucose of 103 is higher than would like, but is not in the diabetes range yet.  Otherwise kidney function on the labs look great.  TSH is still in the normal range.  Bryan Lemma, MD

## 2023-07-10 ENCOUNTER — Other Ambulatory Visit: Payer: Self-pay | Admitting: Cardiology

## 2023-09-01 DIAGNOSIS — G4700 Insomnia, unspecified: Secondary | ICD-10-CM | POA: Diagnosis not present

## 2023-09-01 DIAGNOSIS — F32A Depression, unspecified: Secondary | ICD-10-CM | POA: Diagnosis not present

## 2023-10-15 DIAGNOSIS — G4700 Insomnia, unspecified: Secondary | ICD-10-CM | POA: Diagnosis not present

## 2023-10-15 DIAGNOSIS — F32A Depression, unspecified: Secondary | ICD-10-CM | POA: Diagnosis not present

## 2023-11-12 DIAGNOSIS — E291 Testicular hypofunction: Secondary | ICD-10-CM | POA: Diagnosis not present

## 2023-11-17 DIAGNOSIS — F419 Anxiety disorder, unspecified: Secondary | ICD-10-CM | POA: Diagnosis not present

## 2023-11-17 DIAGNOSIS — F32A Depression, unspecified: Secondary | ICD-10-CM | POA: Diagnosis not present

## 2023-11-17 DIAGNOSIS — G4700 Insomnia, unspecified: Secondary | ICD-10-CM | POA: Diagnosis not present

## 2023-12-02 DIAGNOSIS — F32A Depression, unspecified: Secondary | ICD-10-CM | POA: Diagnosis not present

## 2023-12-02 DIAGNOSIS — F419 Anxiety disorder, unspecified: Secondary | ICD-10-CM | POA: Diagnosis not present

## 2023-12-02 DIAGNOSIS — G4700 Insomnia, unspecified: Secondary | ICD-10-CM | POA: Diagnosis not present

## 2024-01-05 DIAGNOSIS — G4700 Insomnia, unspecified: Secondary | ICD-10-CM | POA: Diagnosis not present

## 2024-01-05 DIAGNOSIS — F419 Anxiety disorder, unspecified: Secondary | ICD-10-CM | POA: Diagnosis not present

## 2024-01-05 DIAGNOSIS — F32A Depression, unspecified: Secondary | ICD-10-CM | POA: Diagnosis not present

## 2024-02-08 DIAGNOSIS — G0490 Encephalitis and encephalomyelitis, unspecified: Secondary | ICD-10-CM | POA: Diagnosis not present

## 2024-02-08 DIAGNOSIS — H9190 Unspecified hearing loss, unspecified ear: Secondary | ICD-10-CM | POA: Diagnosis not present

## 2024-02-16 DIAGNOSIS — G4700 Insomnia, unspecified: Secondary | ICD-10-CM | POA: Diagnosis not present

## 2024-02-16 DIAGNOSIS — F32A Depression, unspecified: Secondary | ICD-10-CM | POA: Diagnosis not present

## 2024-02-16 DIAGNOSIS — F419 Anxiety disorder, unspecified: Secondary | ICD-10-CM | POA: Diagnosis not present

## 2024-05-20 ENCOUNTER — Telehealth: Payer: Self-pay | Admitting: *Deleted

## 2024-05-20 NOTE — Telephone Encounter (Signed)
"   Called left message  for patient to do lab work prior  to upcoming appointment "

## 2024-05-24 ENCOUNTER — Encounter: Payer: Self-pay | Admitting: Cardiology

## 2024-05-24 NOTE — Telephone Encounter (Signed)
 Labs reviewed

## 2024-05-25 ENCOUNTER — Encounter: Payer: Self-pay | Admitting: Cardiology

## 2024-05-25 ENCOUNTER — Ambulatory Visit: Attending: Cardiology | Admitting: Cardiology

## 2024-05-25 ENCOUNTER — Ambulatory Visit: Admitting: Audiologist

## 2024-05-25 VITALS — BP 110/58 | HR 75 | Ht 73.0 in | Wt 192.4 lb

## 2024-05-25 DIAGNOSIS — T466X5D Adverse effect of antihyperlipidemic and antiarteriosclerotic drugs, subsequent encounter: Secondary | ICD-10-CM

## 2024-05-25 DIAGNOSIS — I10 Essential (primary) hypertension: Secondary | ICD-10-CM | POA: Diagnosis present

## 2024-05-25 DIAGNOSIS — G4733 Obstructive sleep apnea (adult) (pediatric): Secondary | ICD-10-CM | POA: Insufficient documentation

## 2024-05-25 DIAGNOSIS — N529 Male erectile dysfunction, unspecified: Secondary | ICD-10-CM | POA: Insufficient documentation

## 2024-05-25 DIAGNOSIS — G720 Drug-induced myopathy: Secondary | ICD-10-CM | POA: Diagnosis present

## 2024-05-25 DIAGNOSIS — I2510 Atherosclerotic heart disease of native coronary artery without angina pectoris: Secondary | ICD-10-CM | POA: Diagnosis present

## 2024-05-25 DIAGNOSIS — E785 Hyperlipidemia, unspecified: Secondary | ICD-10-CM | POA: Diagnosis present

## 2024-05-25 DIAGNOSIS — T466X5A Adverse effect of antihyperlipidemic and antiarteriosclerotic drugs, initial encounter: Secondary | ICD-10-CM | POA: Insufficient documentation

## 2024-05-25 DIAGNOSIS — Z9861 Coronary angioplasty status: Secondary | ICD-10-CM | POA: Diagnosis present

## 2024-05-25 DIAGNOSIS — I7300 Raynaud's syndrome without gangrene: Secondary | ICD-10-CM | POA: Insufficient documentation

## 2024-05-25 MED ORDER — ASPIRIN 81 MG PO TBEC: 81.0000 mg | DELAYED_RELEASE_TABLET | Freq: Every day | ORAL | Status: AC

## 2024-05-25 NOTE — Progress Notes (Signed)
 " Cardiology Office Note:  .   Date:  05/29/2024  ID:  Phillip [name], DOB Feb 02, 1956, MRN [id number] PCP: Phillip [name], Phillip [name]  [facility] Providers Cardiologist:  Phillip [name], Phillip [name]     Chief Complaint  Patient presents with   Follow-up    Annual follow-up.  Doing well.  His wife has questions about prior diagnosis of OSA.  Notes snoring   Coronary Artery Disease    No angina.  Doing well    Patient Profile: Phillip     Phillip [name] is a 69 y.o. male with a PMH noted below who presents here for annual f/u at the request of Phillip [name], [name], Phillip [name].  Phillip [name] is a  69 y.o. male with a CAD-PCI (based on elevated coronary calcium  score based on atypical symptoms), HTN and HLD as well as Raynaud's who presents here for annual follow-up at the request of Phillip [name], [name], Phillip [name].  CAD -February 2020: referred for-Coronary Calcium  Score abnormal-Coronary CTA with LAD disease  => cath with LAD stenosis-PCI  Myoview  07/11/2020: Exercised ~14 min.  15.7 METS.  EF 50 to 55%.  No ischemia infarction.  He did report some chest tightness. CRF's: HLD with statin myopathy: Now on Repatha  HTN  Raynaud's ADD Previously diagnosed OSA-not on therapy Hypogonadism Chronic traumatic encephalopathy     Phillip [name] was last seen on May 22, 2023.  He was doing well with no major issues.  Negative cardiac review of symptoms.  He had [place] the [place] with [place] issues.  He was no longer on Plavix , on aspirin  81 mg daily.  BP stable.  Labs ordered.  Subjective  Discussed the use of AI scribe software for clinical note transcription with the patient, who gave verbal consent to proceed.  History of Present Illness Phillip [name] is a 69 year old male with coronary artery disease who presents for follow-up regarding cholesterol management and sleep apnea.  He has elevated HDL cholesterol at 82 and an LDL cholesterol level of 69. He is currently on Repatha , which was adjusted from every two weeks to  every three weeks, but he notes it 'doesn't seem like it's doing as well.' He is on amlodipine  for blood pressure management, but has discontinued baby aspirin .  He remains very active and with exercising and walking, but not to the extent that he had before.  He has gained some weight-not eating as well and not being as active.  With the level of exercise he does, he denies any anginal symptoms of chest pain pressure or dyspnea.  No PND orthopnea or edema. No rapid irregular heartbeats or palpitations.  No syncope or near syncope, TIA or amaurosis fugax.  He also denies pain in his calves or thighs, muscle cramping, or weakness. He mentions gaining ten pounds but notes his clothes still fit well.  He was diagnosed with sleep apnea several years ago through a sleep study. He was provided with a CPAP device that he found ineffective and has since discontinued its use. His spouse notes that he has episodes of 'four breaths and then doesn't breathe for ten seconds,' particularly when lying on his back.    Objective   Current Medications: Amlodipine  10 mg daily Repatha  140 mg that he is taking every 3 weeks; CoQ 1000 mg daily Adderall  30 mg 1 to 2 tablet daily, Dayvigo 10 mg nightly; sertraline  50 mg (1/2-1 tab daily) AndroGel  50 mg daily As needed sildenafil  100 mg Supplements: Alpha  lipoic acid 300 mg, B complex vitamins, cholecalciferol 105 mg, cyanocobalamin  500, magnesium oxide 40 mg, prostate support, super omega-3 capsule, thiamine 50 mg, vitamin D, vitamin K, completed C and zinc gluconate  Studies Reviewed: Phillip   EKG Interpretation Date/Time:  Wednesday May 25 2024 09:18:53 EST Ventricular Rate:  65 PR Interval:  180 QRS Duration:  96 QT Interval:  366 QTC Calculation: 380 R Axis:   35  Text Interpretation: Normal sinus rhythm Cannot rule out Anterior infarct (cited on or before 08-Jun-2019) When compared with ECG of 22-May-2023 10:32, No significant change was found Confirmed by  Phillip [name] ([id number]) on 05/25/2024 9:33:01 AM    Labs from 05/11/2024: TC 162, TG 55, HDL 82, LDL 69; Glu 93, Cr 0.7, NA 137, K+ 4.4.  ALT T35 but AST mildly elevated at 43.  WBC 4.6, H GB 14.3, PLT 303     Prior Cardiac Studies: Coronary (CTA 06/19/2018): CAC 1318.  Moderate CAD.  LM mixed mild plaque<50%, proximal LAD 51-69% mixed plaque-CT FFR 0.64..  D1 with<50% proximal.  Proximal LCx<50%, LPL moderate (51-69%)-CT FFR 0.72.  Moderate 51-65% mid RCA (CT FFR 0.84). Cardiac Cath (06/30/2018): Prox to mid LAD 80% stenosis and mid LAD 70% => both lesions covered with Synergy XD DES 2.75 mm x 38 mm stent deployed to 3.0 mm.  Proximal LAD 55% prior to D1, D1 40%, mid LCx 60%, mid RCA 30% and 40% stenoses.  Normal EF 55 to 65%. Diagnostic: Dominance: Right     Intervention     Myoview  (07/2020): T he study is normal. Nuclear stress EF: 54%. The left ventricular ejection fraction is mildly decreased (45-54%). Patient walked for 13 minutes and 54 seconds. He achieved a peak heart rate of 133 which is 84% predicted maximal heart rate. He had some nonspecific upsloping ST segment depression at peak exercise but these ST segment changes resolved fairly quickly in the recovery phase. His blood pressure response to exercise was normal. There was no QRS widening at peak exercise. This is a low risk study. No evidence of ischemia and there is no evidence of previous infarction.  Risk Assessment/Calculations:          Physical Exam:   VS:  BP (!) 110/58 (BP Location: Left Arm, Patient Position: Sitting, Cuff Size: Normal)   Pulse 75   Ht 6' 1 (1.854 m)   Wt 192 lb 6.4 oz (87.3 kg)   SpO2 95%   BMI 25.38 kg/m    Wt Readings from Last 3 Encounters:  05/25/24 192 lb 6.4 oz (87.3 kg)  05/22/23 183 lb (83 kg)  11/03/22 176 lb 9.6 oz (80.1 kg)    GEN: Well nourished, well groomed; in no acute distress; overall healthy appearing.  In good spirits. NECK: No JVD; No carotid bruits CARDIAC: Normal S1,  S2; RRR, no murmurs, rubs, gallops RESPIRATORY:  Clear to auscultation without rales, wheezing or rhonchi ; nonlabored, good air movement. ABDOMEN: Soft, non-tender, non-distended EXTREMITIES:  No edema; No deformity     ASSESSMENT AND PLAN: .   CAD S/P percutaneous coronary angioplasty 6 years out from PCI.  No longer on DAPT or Thienopyridine He self stopped his aspirin . - Recommended taking 81 mg aspirin  at least five days a week.  Coronary artery disease involving native coronary artery of native heart without angina pectoris History of LAD PCI February 2020 with moderate LCx disease that was borderline positive in the distal LCx by CT FFR.  Evaluated Myoview  that was nonischemic.  As result we decided to continue to treat the LCx with medical management.  No further angina. Reinitiate aspirin  81 mg daily With slip and lipid management having reduced Repatha  to every 3 weeks, will return to every 2 weeks and reassess. Continue amlodipine  10 mg daily for BP, antianginal effect as well as Raynaud' --indication for CCB over ARB. Not on beta-blockers concerns with fatigue.  Raynaud's disease Doing well on amlodipine .  No major complaints. Reminded him to keep his hands warm during the upcoming cold spell  Essential hypertension Well-controlled on 10 mg amlodipine . Amlodipine  chosen over ARB for antianginal effect as well as Raynaud's. - Continue amlodipine .  OSA (obstructive sleep apnea) Previously diagnosed with OSA.  Initially started on BiPAP, but did not tolerate.  His initial sleep evaluation was not locally, he has not followed up with a sleep medicine provider.  Would like local sleep medicine assessment.  - He would likely need to reassess a sleep study and we will refer him to sleep studies department with sleep split-night sleep study and then if indicated referral to Dr. [facility]  Statin myopathy Both myopathy and memory issues with  multiple different statins including atorvastatin, rosuvastatin  and simvastatin. Was started on Repatha  and was doing well.  However reduced to every 3 weeks and has now had a worsening LDL.  Going back to every 3 weeks.  Erectile dysfunction PRN sildenafil -100 mg.  Sildenafil  preferred over a longer acting agents to avoid potential interaction with nitroglycerin .  Hyperlipidemia with target low density lipoprotein (LDL) cholesterol less than 55 mg/dL Most recent LDL increased to 69 mg/dL, above target of <44.   Repatha  dosing interval was previously extended to three weeks, resulting in increased LDL. Discussed returning to two-week dosing for better control. - Continue Repatha  every two weeks.   Orders Placed This Encounter  Procedures   Ambulatory referral to Sleep Studies   Ambulatory referral to Cardiology   EKG 12-Lead   Split night study    Meds ordered this encounter  Medications   aspirin  EC 81 MG tablet    Sig: Take 1 tablet (81 mg total) by mouth daily. Swallow whole.          Follow-Up: Return in about 1 year (around 05/25/2025) for Routine follow up with me, [employer].  Portions of this note were dictated using DRAGON voice recognition software. Please disregard any errors in transcription. This record has been created using Conservation officer, historic buildings. Errors have been sought and corrected, but may not always be located. Such creation errors do not reflect on the standard of medical care.      Signed, Phillip MICAEL Clay, MD, MS Phillip [name], M.D., M.S. Interventional Cardiologist  [facility] Pager # [phone]      "

## 2024-05-25 NOTE — Telephone Encounter (Signed)
 Labs from 05/11/2024: TC 162, TG 55, HDL 82, LDL 69; Glu 93, Cr 0.7, NA 137, K+ 4.4.  ALT T35 but AST mildly elevated at 43.  WBC 4.6, H GB 14.3, PLT 303

## 2024-05-25 NOTE — Patient Instructions (Addendum)
 Medication Instructions:   Restart taking Aspirin  81 mg daily   Start taking your Repatha  every 2 weeks  for 3 weeks   *If you need a refill on your cardiac medications before your next appointment, please call your pharmacy*   Lab Work: Not needed    Testing/Procedures: Your physician has recommended that you have a  split night sleep study. This test records several body functions during sleep, including: brain activity, eye movement, oxygen and carbon dioxide blood levels, heart rate and rhythm, breathing rate and rhythm, the flow of air through your mouth and nose, snoring, body muscle movements, and chest and belly movement.    Follow-Up: At [facility], you and your health needs are our priority.  As part of our continuing mission to provide you with exceptional heart care, we have created designated Provider Care Teams.  These Care Teams include your primary Cardiologist (physician) and Advanced Practice Providers (APPs -  Physician Assistants and Nurse Practitioners) who all work together to provide you with the care you need, when you need it.     Your next appointment:   1 year(s)  The format for your next appointment:   In Person  Provider:   Alm Clay, MD  You have been referred to Sleep Apnea provider  to follow with your sleep apnea  Other Instructions

## 2024-05-29 ENCOUNTER — Encounter: Payer: Self-pay | Admitting: Cardiology

## 2024-05-29 NOTE — Assessment & Plan Note (Signed)
 Doing well on amlodipine .  No major complaints. Reminded him to keep his hands warm during the upcoming cold spell

## 2024-05-29 NOTE — Assessment & Plan Note (Signed)
 Most recent LDL increased to 69 mg/dL, above target of <44.   Repatha  dosing interval was previously extended to three weeks, resulting in increased LDL. Discussed returning to two-week dosing for better control. - Continue Repatha  every two weeks.

## 2024-05-29 NOTE — Assessment & Plan Note (Signed)
 6 years out from PCI.  No longer on DAPT or Thienopyridine He self stopped his aspirin . - Recommended taking 81 mg aspirin  at least five days a week.

## 2024-05-29 NOTE — Assessment & Plan Note (Addendum)
 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  Evaluated Myoview  that was nonischemic. As result we decided to continue to treat the LCx with medical management.  No further angina. Reinitiate aspirin  81 mg daily With slip and lipid management having reduced Repatha  to every 3 weeks, will return to every 2 weeks and reassess. Continue amlodipine  10 mg daily for BP, antianginal effect as well as Raynaud' --indication for CCB over ARB. Not on beta-blockers concerns with fatigue.

## 2024-05-29 NOTE — Assessment & Plan Note (Signed)
 Well-controlled on 10 mg amlodipine . Amlodipine  chosen over ARB for antianginal effect as well as Raynaud's. - Continue amlodipine .

## 2024-05-29 NOTE — Assessment & Plan Note (Signed)
 PRN sildenafil -100 mg.  Sildenafil  preferred over a longer acting agents to avoid potential interaction with nitroglycerin .

## 2024-05-29 NOTE — Assessment & Plan Note (Signed)
 Previously diagnosed with OSA.  Initially started on BiPAP, but did not tolerate.  His initial sleep evaluation was not locally, he has not followed up with a sleep medicine provider.  Would like local sleep medicine assessment.  - He would likely need to reassess a sleep study and we will refer him to sleep studies department with sleep split-night sleep study and then if indicated referral to Dr. [facility]

## 2024-05-29 NOTE — Assessment & Plan Note (Addendum)
 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 Was started on Repatha  and was doing well.  However reduced to every 3 weeks and has now had a worsening LDL.  Going back to every 3 weeks.

## 2024-05-30 ENCOUNTER — Ambulatory Visit: Admitting: Audiologist
